# Patient Record
Sex: Male | Born: 1975
Health system: Southern US, Community
[De-identification: ages and names within clinical notes are randomized; demographics above are authoritative.]

## PROBLEM LIST (undated history)

## (undated) DIAGNOSIS — I10 Essential (primary) hypertension: Secondary | ICD-10-CM

## (undated) DIAGNOSIS — S83207A Unspecified tear of unspecified meniscus, current injury, left knee, initial encounter: Secondary | ICD-10-CM

## (undated) DIAGNOSIS — Z9889 Other specified postprocedural states: Secondary | ICD-10-CM

## (undated) DIAGNOSIS — Z5189 Encounter for other specified aftercare: Secondary | ICD-10-CM

## (undated) DIAGNOSIS — R112 Nausea with vomiting, unspecified: Secondary | ICD-10-CM

## (undated) DIAGNOSIS — M1990 Unspecified osteoarthritis, unspecified site: Secondary | ICD-10-CM

## (undated) DIAGNOSIS — S83512A Sprain of anterior cruciate ligament of left knee, initial encounter: Secondary | ICD-10-CM

## (undated) DIAGNOSIS — K7460 Unspecified cirrhosis of liver: Secondary | ICD-10-CM

## (undated) DIAGNOSIS — C229 Malignant neoplasm of liver, not specified as primary or secondary: Secondary | ICD-10-CM

## (undated) HISTORY — DX: Malignant neoplasm of liver, not specified as primary or secondary: C22.9

## (undated) HISTORY — PX: ARTHROSCOPIC REPAIR ACL: SUR80

## (undated) HISTORY — DX: Encounter for other specified aftercare: Z51.89

## (undated) HISTORY — PX: HERNIA REPAIR: SHX51

## (undated) NOTE — *Deleted (*Deleted)
Medical screening examination/treatment/procedure(s) were conducted as a shared visit with non-physician practitioner(s) and myself.  I personally evaluated the patient during the encounter.  EKG Interpretation  Date/Time:  Saturday December 14 2019 09:30:10 EST Ventricular Rate:  79 PR Interval:    QRS Duration: 101 QT Interval:  410 QTC Calculation: 473 R Axis:   -33 Text Interpretation: Sinus rhythm Left axis deviation no sig change from previous Confirmed by Arby Barrette [phone]) on 12/14/2019 10:59:25 AM

---

## 1997-05-23 ENCOUNTER — Encounter: Admission: RE | Admit: 1997-05-23 | Discharge: 1997-05-23 | Payer: Self-pay | Admitting: Family Medicine

## 1999-03-25 ENCOUNTER — Encounter: Admission: RE | Admit: 1999-03-25 | Discharge: 1999-03-25 | Payer: Self-pay | Admitting: Family Medicine

## 2000-11-17 ENCOUNTER — Encounter: Admission: RE | Admit: 2000-11-17 | Discharge: 2000-11-17 | Payer: Self-pay | Admitting: Family Medicine

## 2003-04-29 ENCOUNTER — Encounter: Admission: RE | Admit: 2003-04-29 | Discharge: 2003-04-29 | Payer: Self-pay | Admitting: Family Medicine

## 2003-05-02 ENCOUNTER — Encounter: Admission: RE | Admit: 2003-05-02 | Discharge: 2003-05-02 | Payer: Self-pay | Admitting: Family Medicine

## 2003-05-06 ENCOUNTER — Encounter: Admission: RE | Admit: 2003-05-06 | Discharge: 2003-05-06 | Payer: Self-pay | Admitting: Sports Medicine

## 2004-09-06 ENCOUNTER — Ambulatory Visit (HOSPITAL_COMMUNITY): Admission: RE | Admit: 2004-09-06 | Discharge: 2004-09-06 | Payer: Self-pay | Admitting: *Deleted

## 2004-09-14 ENCOUNTER — Ambulatory Visit (HOSPITAL_COMMUNITY): Admission: RE | Admit: 2004-09-14 | Discharge: 2004-09-14 | Payer: Self-pay | Admitting: *Deleted

## 2007-12-25 ENCOUNTER — Ambulatory Visit: Payer: Self-pay | Admitting: Sports Medicine

## 2007-12-25 DIAGNOSIS — M79609 Pain in unspecified limb: Secondary | ICD-10-CM | POA: Insufficient documentation

## 2007-12-25 DIAGNOSIS — S93316A Dislocation of tarsal joint of unspecified foot, initial encounter: Secondary | ICD-10-CM | POA: Insufficient documentation

## 2010-06-11 NOTE — Op Note (Signed)
NAMESHEPARD, KELTZ               ACCOUNT NO.:  [id number]   MEDICAL RECORD NO.:  [id number]          PATIENT TYPE:  AMB   LOCATION:  DAY                          FACILITY:  [facility]   PHYSICIAN:  Vikki Ports, MDDATE OF BIRTH:  January 31, 1975   DATE OF PROCEDURE:  09/14/2004  DATE OF DISCHARGE:                                 OPERATIVE REPORT   PREOPERATIVE DIAGNOSIS:  Left inguinal hernia.   POSTOPERATIVE DIAGNOSIS:  Left inguinal hernia.   PROCEDURE:  Laparoscopic left inguinal hernia repair with mesh.   SURGEON:  Vikki Ports, MD   ANESTHESIA:  General.   DESCRIPTION OF PROCEDURE:  The patient was taken to the operating room,  placed in a supine position and after adequate general anesthesia was  induced using an endotracheal tube, a Foley catheter was placed and the  abdomen was prepped and draped in the normal sterile fashion. Using a  transverse infraumbilical incision, I dissected down to the anterior fascia.  This was opened transversely. Preperitoneal dissection was obtained and a  dissecting balloon was placed within the preperitoneal space. It was dilated  under direct vision. An operating balloon was then placed. Two additional 5  mm trocars were placed. The pubic tubercle and Cooper's  ligament was identified, femoral vein was identified. The spermatic cord was  surrounded and a fairly large indirect hernia sac was dissected free of the  cord and retracted up superolaterally. The direct hernia space showed no  other evidence of hernia. The spermatic cord was completely skeletonized and  there was no further evidence of hernia. A Bard 3-D Max mesh was then placed  in the left anterior preperitoneal space and tacked to Cooper's ligament and  to the anterior abdominal wall in the standard fashion. This gave excellent  coverage of the floor. The pneumoperitoneum was released, fascial defect was  closed with a zero figure-of-eight Vicryl suture. Skin  incisions were closed  with subcuticular 4-0 Monocryl. Steri-Strips and sterile dressings were  applied. The patient tolerated the procedure well and went to PACU in good  condition.      Vikki Ports, MD  Electronically Signed     KRH/MEDQ  D:  09/14/2004  T:  09/15/2004  Job:  [id number]

## 2011-09-23 ENCOUNTER — Ambulatory Visit (INDEPENDENT_AMBULATORY_CARE_PROVIDER_SITE_OTHER): Payer: BC Managed Care – PPO | Admitting: Sports Medicine

## 2011-09-23 VITALS — BP 137/87 | Ht 69.0 in | Wt 175.0 lb

## 2011-09-23 DIAGNOSIS — M7550 Bursitis of unspecified shoulder: Secondary | ICD-10-CM

## 2011-09-23 DIAGNOSIS — M25519 Pain in unspecified shoulder: Secondary | ICD-10-CM

## 2011-09-23 DIAGNOSIS — IMO0002 Reserved for concepts with insufficient information to code with codable children: Secondary | ICD-10-CM

## 2011-09-23 DIAGNOSIS — M75100 Unspecified rotator cuff tear or rupture of unspecified shoulder, not specified as traumatic: Secondary | ICD-10-CM

## 2011-09-23 MED ORDER — TRAMADOL HCL 50 MG PO TABS: 50.0000 mg | ORAL_TABLET | Freq: Three times a day (TID) | ORAL | Status: DC | PRN

## 2011-09-23 MED ORDER — DICLOFENAC SODIUM 75 MG PO TBEC: DELAYED_RELEASE_TABLET | ORAL | Status: DC

## 2011-09-23 NOTE — Progress Notes (Signed)
  Subjective:    Patient ID: Joshua Hubbard, male    DOB: 1975-02-10, 36 y.o.   MRN: [id number]  HPI chief complaint: Right shoulder pain  Joshua Hubbard is a 36 year old right-hand-dominant male that comes in today complaining of 3 weeks of right shoulder pain. No injury that he can recall but gradual onset of pain that he localizes mainly over the area of the a.c. Joint. Some pain in the lateral shoulder as well. Pain is most noticeable with reaching away from his body. He's getting painful catching and popping. He does remember going on a long hike with a 37 pound backpack just prior to his symptoms starting. He also did quite a bit of yard work several days ago which exacerbated his pain. Symptoms improve at rest. No associated numbness or tingling. No neck pain. No problems with this shoulder in the past.  He is otherwise healthy, takes no chronic medications No known drug allergies Surgical history: Anterior cruciate ligament reconstruction in 2003, hernia repair in 2007 Socially he doesn't smoke, drinks occasionally, and works as a Runner, broadcasting/film/video.    Review of Systems     Objective:   Physical Exam Well-developed, well-nourished. No acute distress. Awake alert and oriented x3  Right shoulder: Full range of motion with a positive painful ARC. He is tender to palpation directly over the a.c. Joint. No tenderness over the bicipital groove. Mildly positive empty can but markedly positive Hawkins. Rotator cuff strength 5/5. Positive cross arm abduction testing. Negative O'Briens. Negative Spurling's. Neurovascularly intact distally.  Left shoulder: Full range of motion. No tenderness to palpation. Strength is 5/5. Neurovascular intact distally.  MSK ultrasound right shoulder: Subacromial bursa is markedly enlarged consistent with bursitis. There is also fluid around the a.c. joint and some in the proximal biceps tendon. Subscapularis, supraspinatus, and infraspinatus appear to be intact. He has evidence of  subacromial impingement with active shoulder abduction. This causes a palpable painful click at the subacromial bursa slides underneath the acromion.       Assessment & Plan:  1. Right shoulder pain secondary to subacromial bursitis  I discussed the patient's treatment options including oral anti-inflammatories versus a cortisone injection. Patient was like to try Voltaren 75 mg twice daily. We will give it to him for 7 days. Also given him a prescription for Ultram to take as needed for more severe pain. Patient understands that if symptoms persist we will want to reconsider a subacromial cortisone injection. I've asked him to call me next week to let me know how he is progressing. In the meantime, no heavy lifting or pushing with the right arm. He will work on passive and active range of motion to help prevent adhesive capsulitis.

## 2011-10-11 ENCOUNTER — Encounter: Payer: Self-pay | Admitting: Family Medicine

## 2011-10-11 ENCOUNTER — Ambulatory Visit (INDEPENDENT_AMBULATORY_CARE_PROVIDER_SITE_OTHER): Payer: BC Managed Care – PPO | Admitting: Family Medicine

## 2011-10-11 VITALS — BP 129/79 | HR 94 | Ht 69.0 in | Wt 175.0 lb

## 2011-10-11 DIAGNOSIS — M75100 Unspecified rotator cuff tear or rupture of unspecified shoulder, not specified as traumatic: Secondary | ICD-10-CM

## 2011-10-11 DIAGNOSIS — M7550 Bursitis of unspecified shoulder: Secondary | ICD-10-CM

## 2011-10-11 DIAGNOSIS — M25519 Pain in unspecified shoulder: Secondary | ICD-10-CM

## 2011-10-11 DIAGNOSIS — IMO0002 Reserved for concepts with insufficient information to code with codable children: Secondary | ICD-10-CM

## 2011-10-11 MED ORDER — TRAMADOL HCL 50 MG PO TABS: 50.0000 mg | ORAL_TABLET | Freq: Three times a day (TID) | ORAL | Status: AC | PRN

## 2011-10-11 NOTE — Progress Notes (Signed)
  Subjective:    Patient ID: Joshua Hubbard, male    DOB: April 15, 1975, 36 y.o.   MRN: [id number]  HPI chief complaint: Right shoulder pain  [name] comes in today with persistent right shoulder pain despite Voltaren. Ultrasound at last visit showed evidence of rotator cuff impingement and subacromial bursitis. He continues to localize his pain to the anterior lateral shoulder. Worse with reaching overhead. Some nighttime pain. Some catching and popping. No numbness and tingling.   Review of Systems     Objective:   Physical Exam Well-developed, well-nourished. No acute distress. Awake alert and oriented x3  Right shoulder: Full range of motion with a positive painful ARC. Positive Hawkins impingement test. Mildly positive empty can. Rotator cuff strength remains 5 out of 5 but slightly reproducible pain with resisted supraspinatus. No tenderness over the bicipital groove. Negative O'Briens. Neurovascular intact distally.       Assessment & Plan:  1. Persistent right shoulder pain secondary to subacromial bursitis/rotator cuff impingement  Patient's right subacromial space was injected today with a combination of 40 mg of Depo-Medrol and 3 cc of Marcaine. Injection was performed under sterile technique after risks and benefits were explained. Patient tolerated the procedure without any difficulty. He is shown a home exercise program and told to followup with me via telephone in 3-4 weeks. If symptoms persist we may need to consider merits of further diagnostic imaging in the form of plain x-rays and possibly an MRI arthrogram.  Consent obtained and verified. Time-out conducted. Noted no overlying erythema, induration, or other signs of local infection. Skin prepped in a sterile fashion. Topical analgesic spray: Ethyl chloride. Joint: Subacromial space Needle: 25-gauge 1-1/2 Completed without difficulty. Meds: 1 cc Depo-Medrol, 3 cc Marcaine  Advised to call if fevers/chills, erythema,  induration, drainage, or persistent bleeding.

## 2012-01-20 ENCOUNTER — Ambulatory Visit (INDEPENDENT_AMBULATORY_CARE_PROVIDER_SITE_OTHER): Payer: BC Managed Care – PPO | Admitting: Sports Medicine

## 2012-01-20 VITALS — BP 140/97 | Ht 69.0 in | Wt 175.0 lb

## 2012-01-20 DIAGNOSIS — S83419A Sprain of medial collateral ligament of unspecified knee, initial encounter: Secondary | ICD-10-CM

## 2012-01-20 DIAGNOSIS — M25569 Pain in unspecified knee: Secondary | ICD-10-CM

## 2012-01-20 MED ORDER — ACETAMINOPHEN-CODEINE #3 300-30 MG PO TABS: 1.0000 | ORAL_TABLET | Freq: Every evening | ORAL | Status: DC | PRN

## 2012-01-20 NOTE — Progress Notes (Signed)
  Subjective:    Patient ID: Joshua Hubbard, male    DOB: Mar 12, 1975, 36 y.o.   MRN: [id number]  HPI chief complaint: Left knee pain  Right comes in today having injured his left knee 3 weeks ago. He suffered a valgus and cell to the knee when his foot slipped on the ground. He immediate pain and swelling both along the medial aspect of his knee. Swelling has improved but pain has persisted. It is aching in quality. Worse with activity particularly at the end of the day. He notes increased swelling of the knee at the end of the day as well. He has been limping which has recently caused him to have pain in his right knee and both ankles first thing in the morning. However, he has not noticed swelling in any other joint. No mechanical symptoms. No feelings of instability. No problems with this knee in the past, but he did undergo right knee anterior cruciate ligament reconstruction with Dr. Thurston Hole in 2005. He's been taking intermittent doses of Advil. Also trying some Ultram at night but neither one of these seem to be very effective.  Interim medical history is unchanged since his last office.     Review of Systems     Objective:   Physical Exam Well-developed, well-nourished. No acute distress. Awake alert and oriented x3. Vital signs are reviewed.  Left knee: Range of motion 0-130. No effusion. No soft tissue swelling. He is tender to palpation along the medial femoral condyle at the origin of the medial collateral ligament. There is good stability without distressing but this does reproduce mild pain. Knee is stable to varus stressing. 1+ lachman and 1+ anterior drawer but a good solid endpoint is felt. Posterior drawer is negative. There is tenderness to palpation along the medial joint line with pain but no popping with McMurray's. No tenderness along the lateral joint. Neurovascularly intact distally.  Right knee: Full range of motion. No effusion. Well-healed surgical incision consistent  with his prior anterior cruciate ligament reconstruction. Negative Lachman, negative anterior drawer. Neurovascular intact distally.  MSK ultrasound of the left knee: Limited views of the medial knee were obtained. The visualized portion of the medial meniscus appears to be intact but there is fluid surrounding the meniscus with some apparent subluxation. Medial collateral ligament appears to be intact.       Assessment & Plan:  1. Left knee pain secondary to MCL sprain with possible medial meniscal tear  I discussed the possibility of an MRI of his left knee but we will wait on that for now. I will get plain x-rays of this knee and he will start Voltaren 75 mg twice daily with food for 7 days. Body helix compression sleeve to wear during the day. Isometric quad exercises. Tylenol #3 to take each bedtime when necessary. He will followup with me on January 15th. If symptoms persist we will revisit the idea of an MRI scan specifically to rule out a medial meniscal tear. He'll call with questions or concerns in the interim.

## 2012-03-29 ENCOUNTER — Other Ambulatory Visit: Payer: Self-pay | Admitting: *Deleted

## 2012-03-29 MED ORDER — HYDROCODONE-HOMATROPINE 5-1.5 MG/5ML PO SYRP: 5.0000 mL | ORAL_SOLUTION | Freq: Four times a day (QID) | ORAL | Status: DC | PRN

## 2012-04-01 ENCOUNTER — Telehealth: Payer: Self-pay | Admitting: Sports Medicine

## 2012-04-01 NOTE — Telephone Encounter (Signed)
I spoke with Joshua Hubbard on the phone a couple of days ago. He was struggling with a nonproductive cough for 2 weeks. He was requesting a refill on hydrocodone cough medicine. I've agreed to call him in a refill. I also explained to him that if his symptoms persist or worsen that he needs to followup with me in the office. He understands.

## 2012-05-22 ENCOUNTER — Ambulatory Visit: Payer: BC Managed Care – PPO | Admitting: Family Medicine

## 2012-05-30 ENCOUNTER — Ambulatory Visit: Payer: BC Managed Care – PPO | Admitting: Sports Medicine

## 2012-06-04 ENCOUNTER — Ambulatory Visit: Payer: BC Managed Care – PPO | Admitting: Sports Medicine

## 2012-07-24 ENCOUNTER — Other Ambulatory Visit: Payer: Self-pay | Admitting: *Deleted

## 2012-07-24 MED ORDER — SCOPOLAMINE 1 MG/3DAYS TD PT72: MEDICATED_PATCH | TRANSDERMAL | Status: DC

## 2012-11-09 ENCOUNTER — Ambulatory Visit (INDEPENDENT_AMBULATORY_CARE_PROVIDER_SITE_OTHER): Payer: BC Managed Care – PPO | Admitting: Sports Medicine

## 2012-11-09 ENCOUNTER — Encounter: Payer: Self-pay | Admitting: Sports Medicine

## 2012-11-09 VITALS — BP 120/70 | Ht 69.0 in | Wt 170.0 lb

## 2012-11-09 DIAGNOSIS — J309 Allergic rhinitis, unspecified: Secondary | ICD-10-CM

## 2012-11-09 DIAGNOSIS — M899 Disorder of bone, unspecified: Secondary | ICD-10-CM

## 2012-11-09 DIAGNOSIS — R0789 Other chest pain: Secondary | ICD-10-CM

## 2012-11-09 MED ORDER — HYDROCODONE-HOMATROPINE 5-1.5 MG/5ML PO SYRP: 5.0000 mL | ORAL_SOLUTION | Freq: Four times a day (QID) | ORAL | Status: DC | PRN

## 2012-11-09 MED ORDER — AZITHROMYCIN 250 MG PO TABS: ORAL_TABLET | ORAL | Status: DC

## 2012-11-09 NOTE — Progress Notes (Signed)
  Subjective:    Patient ID: Joshua Hubbard, male    DOB: [birth year], 37 y.o.   MRN: [id number]  HPI chief complaint: Epigastric pain  37 year old comes in today complaining of 2 weeks of epigastric pain. He localizes his pain directly to his xiphoid process. He is concerned because he was told several years ago that he may have a hiatal hernia in this area. He denies any heartburn. No reflux symptoms. He has pain with direct palpation over the xiphoid as well as with doing sit-ups. In fact, he tells me that he recently started doing sit-ups more frequently about a month ago and an effort to get into shape. He is also complaining of one week of sinus congestion and drainage. Mildly productive cough mainly at night. No fevers. He has been taking over-the-counter Zyrtec which has been minimally helpful.  Interim medical history is unchanged    Review of Systems     Objective:   Physical Exam Well-developed, well-nourished. No acute distress. Awake alert and oriented x3. Blood pressure 120/70  HEENT: There is tenderness to percussion over the maxillary sinuses bilaterally. Throat is clear. Neck is supple. No lymphadenopathy Cardiovascular: Regular rate. No murmurs rubs or gallops. Lungs: Clear to auscultation bilaterally. No rhonchi rales or wheezes. Abdomen: There is tenderness to palpation directly over the xiphoid process with reproducible pain with resisted sitting up. There is no palpable defect in the abdominal wall.       Assessment & Plan:  1. Xiphoid pain 2. Allergic rhinitis possibly with early sinusitis  Patient is reassured that I do not think his symptoms are from a hiatal hernia. He will refrain from sit-ups for the next 3 or 4 weeks. For his allergic rhinitis he will continue on either Zyrtec or Allegra. I have refilled his Hycodan to take when necessary at night for cough. I provided him with a prescription for a Z-Pak to fill in 2-3 days if symptoms worsen. Followup with  me in 4-6 weeks. In the meantime he will get a fasting lipid profile as a cholesterol screen.

## 2012-12-18 ENCOUNTER — Ambulatory Visit: Payer: BC Managed Care – PPO | Admitting: Sports Medicine

## 2013-01-09 ENCOUNTER — Other Ambulatory Visit: Payer: Self-pay | Admitting: Sports Medicine

## 2013-01-10 ENCOUNTER — Encounter: Payer: Self-pay | Admitting: Sports Medicine

## 2013-01-10 ENCOUNTER — Ambulatory Visit (INDEPENDENT_AMBULATORY_CARE_PROVIDER_SITE_OTHER): Payer: BC Managed Care – PPO | Admitting: Sports Medicine

## 2013-01-10 VITALS — BP 130/90 | HR 86 | Ht 69.0 in | Wt 170.0 lb

## 2013-01-10 DIAGNOSIS — E7800 Pure hypercholesterolemia, unspecified: Secondary | ICD-10-CM

## 2013-01-10 DIAGNOSIS — R030 Elevated blood-pressure reading, without diagnosis of hypertension: Secondary | ICD-10-CM

## 2013-01-10 DIAGNOSIS — F411 Generalized anxiety disorder: Secondary | ICD-10-CM

## 2013-01-10 DIAGNOSIS — IMO0001 Reserved for inherently not codable concepts without codable children: Secondary | ICD-10-CM

## 2013-01-10 LAB — LIPID PANEL
Cholesterol: 253 mg/dL — ABNORMAL HIGH (ref 0–200)
LDL Cholesterol: 161 mg/dL — ABNORMAL HIGH (ref 0–99)
Triglycerides: 172 mg/dL — ABNORMAL HIGH (ref <150)
VLDL: 34 mg/dL (ref 0–40)

## 2013-01-10 MED ORDER — DIAZEPAM 10 MG PO TABS: ORAL_TABLET | ORAL | Status: DC

## 2013-01-10 NOTE — Progress Notes (Signed)
   Subjective:    Patient ID: Joshua Hubbard, male    DOB: December 03, 1975, 37 y.o.   MRN: [id number]  HPI Joshua Hubbard comes in today to discuss blood work. He is feeling pretty good. His xiphoid process pain has improved. His only complaint is some increased anxiety with the upcoming holidays. He is requesting a benzodiazepine to take on an as-needed basis.    Review of Systems     Objective:   Physical Exam Well-developed, well-nourished. No acute distress. Slightly anxious appearing. Blood pressure 130/90 Cardiovascular: Regular rate, no murmurs, rubs, or gallops Lungs: Clear to auscultation bilaterally Abdomen: Benign. No tenderness to palpation over the xiphoid process. Extremities: No edema  A fasting lipid profile dated 01/09/2013 is reviewed. Total cholesterol is elevated at 253. Triglycerides 172. LDL is 161. HDL is 58       Assessment & Plan:  1. Elevated cholesterol 2. Mild anxiety (situational do to the upcoming holidays) 3. Elevated blood pressure 4. Resolved the xiphoid process pain  Long talk with the patient regarding diet and exercise in controlling his blood pressure and cholesterol. Patient has a history of hypercholesterolemia in the past which has required medication but I'm hoping with lifestyle changes we will be able to avoid a return to medication. His blood pressure was well-controlled at his last office visit and given his anxiety I am willing to wait and recheck this at a later date. In fact, we will plan on rechecking his blood pressure and his cholesterol in 6 months. I gave him a prescription for Valium 10 mg to take 1/2-1 pills daily as needed for anxiety, #20 pills with no refills.

## 2013-03-29 ENCOUNTER — Other Ambulatory Visit: Payer: Self-pay | Admitting: *Deleted

## 2013-03-29 MED ORDER — DIAZEPAM 10 MG PO TABS: ORAL_TABLET | ORAL | Status: DC

## 2014-07-07 ENCOUNTER — Other Ambulatory Visit: Payer: Self-pay | Admitting: *Deleted

## 2014-07-07 MED ORDER — DICLOFENAC SODIUM 75 MG PO TBEC: DELAYED_RELEASE_TABLET | ORAL | Status: DC

## 2014-08-21 ENCOUNTER — Ambulatory Visit (INDEPENDENT_AMBULATORY_CARE_PROVIDER_SITE_OTHER): Payer: BC Managed Care – PPO | Admitting: Family Medicine

## 2014-08-21 VITALS — BP 143/85 | HR 83 | Temp 98.7°F | Ht 69.0 in | Wt 167.7 lb

## 2014-08-21 DIAGNOSIS — L723 Sebaceous cyst: Secondary | ICD-10-CM

## 2014-08-21 NOTE — Progress Notes (Signed)
Joshua Hubbard is here for removal of a cyst on his back. Has been opresent several months and is getting larger. It is now sometimes painful shenhe leans back in a chair. No bleeding or pus from the cyst.  Pertinent review of systems: negative for fever or unusual weight change. OBJ WD WN NAD SKIN: upper back, in midline is a small 7 mm in diameter soft cyst. Mild erythema. Small amount sebaceous material canbe expressed with very slight pressure.  Assessment : sebaceous cyst--mildly inflamed Plan : excision  Patient given informed consent, signed copy in the chart. Appropriate time out taken. Area prepped and draped in usual sterile fashion. A 1 cm linear incision was made. The sebaceous cyst was identified and both blunt  Dissection and small amounts of sharp dissection were used to remove the cyst and all apparent portions of the sac.  A small amount of dermabond  were used to close the wound. A small amount of antibiotic  Ointment was applied and a bandage. Post procedure instructions were given, Par\tient tolerated the procedure well. There was less than 3 cc blood loss. He can f/u prn Post procedure instructions for wound care were discussed---no sutures so he can get in the pool which makes him very happy. Discussed signs of infection and he knows to call with any problems.

## 2015-03-05 ENCOUNTER — Other Ambulatory Visit: Payer: Self-pay | Admitting: Family Medicine

## 2015-03-05 MED ORDER — HYDROCODONE-HOMATROPINE 5-1.5 MG/5ML PO SYRP: 5.0000 mL | ORAL_SOLUTION | Freq: Four times a day (QID) | ORAL | Status: DC | PRN

## 2015-10-26 ENCOUNTER — Ambulatory Visit
Admission: RE | Admit: 2015-10-26 | Discharge: 2015-10-26 | Disposition: A | Discharge status: Home / Self Care | Payer: No Typology Code available for payment source | Source: Ambulatory Visit | Attending: Sports Medicine | Admitting: Sports Medicine

## 2015-10-26 ENCOUNTER — Ambulatory Visit (INDEPENDENT_AMBULATORY_CARE_PROVIDER_SITE_OTHER): Payer: Self-pay | Admitting: Sports Medicine

## 2015-10-26 DIAGNOSIS — M25562 Pain in left knee: Secondary | ICD-10-CM

## 2015-10-26 DIAGNOSIS — M79605 Pain in left leg: Secondary | ICD-10-CM

## 2015-10-26 IMAGING — CR DG TIBIA/FIBULA 2V*L*
2 series · 2 of 2 positions shown · non-contrast
Comparison: No recent prior .

CLINICAL DATA: Injury.

EXAM:
LEFT TIBIA AND FIBULA - 2 VIEW

[x tib-fib ap left]
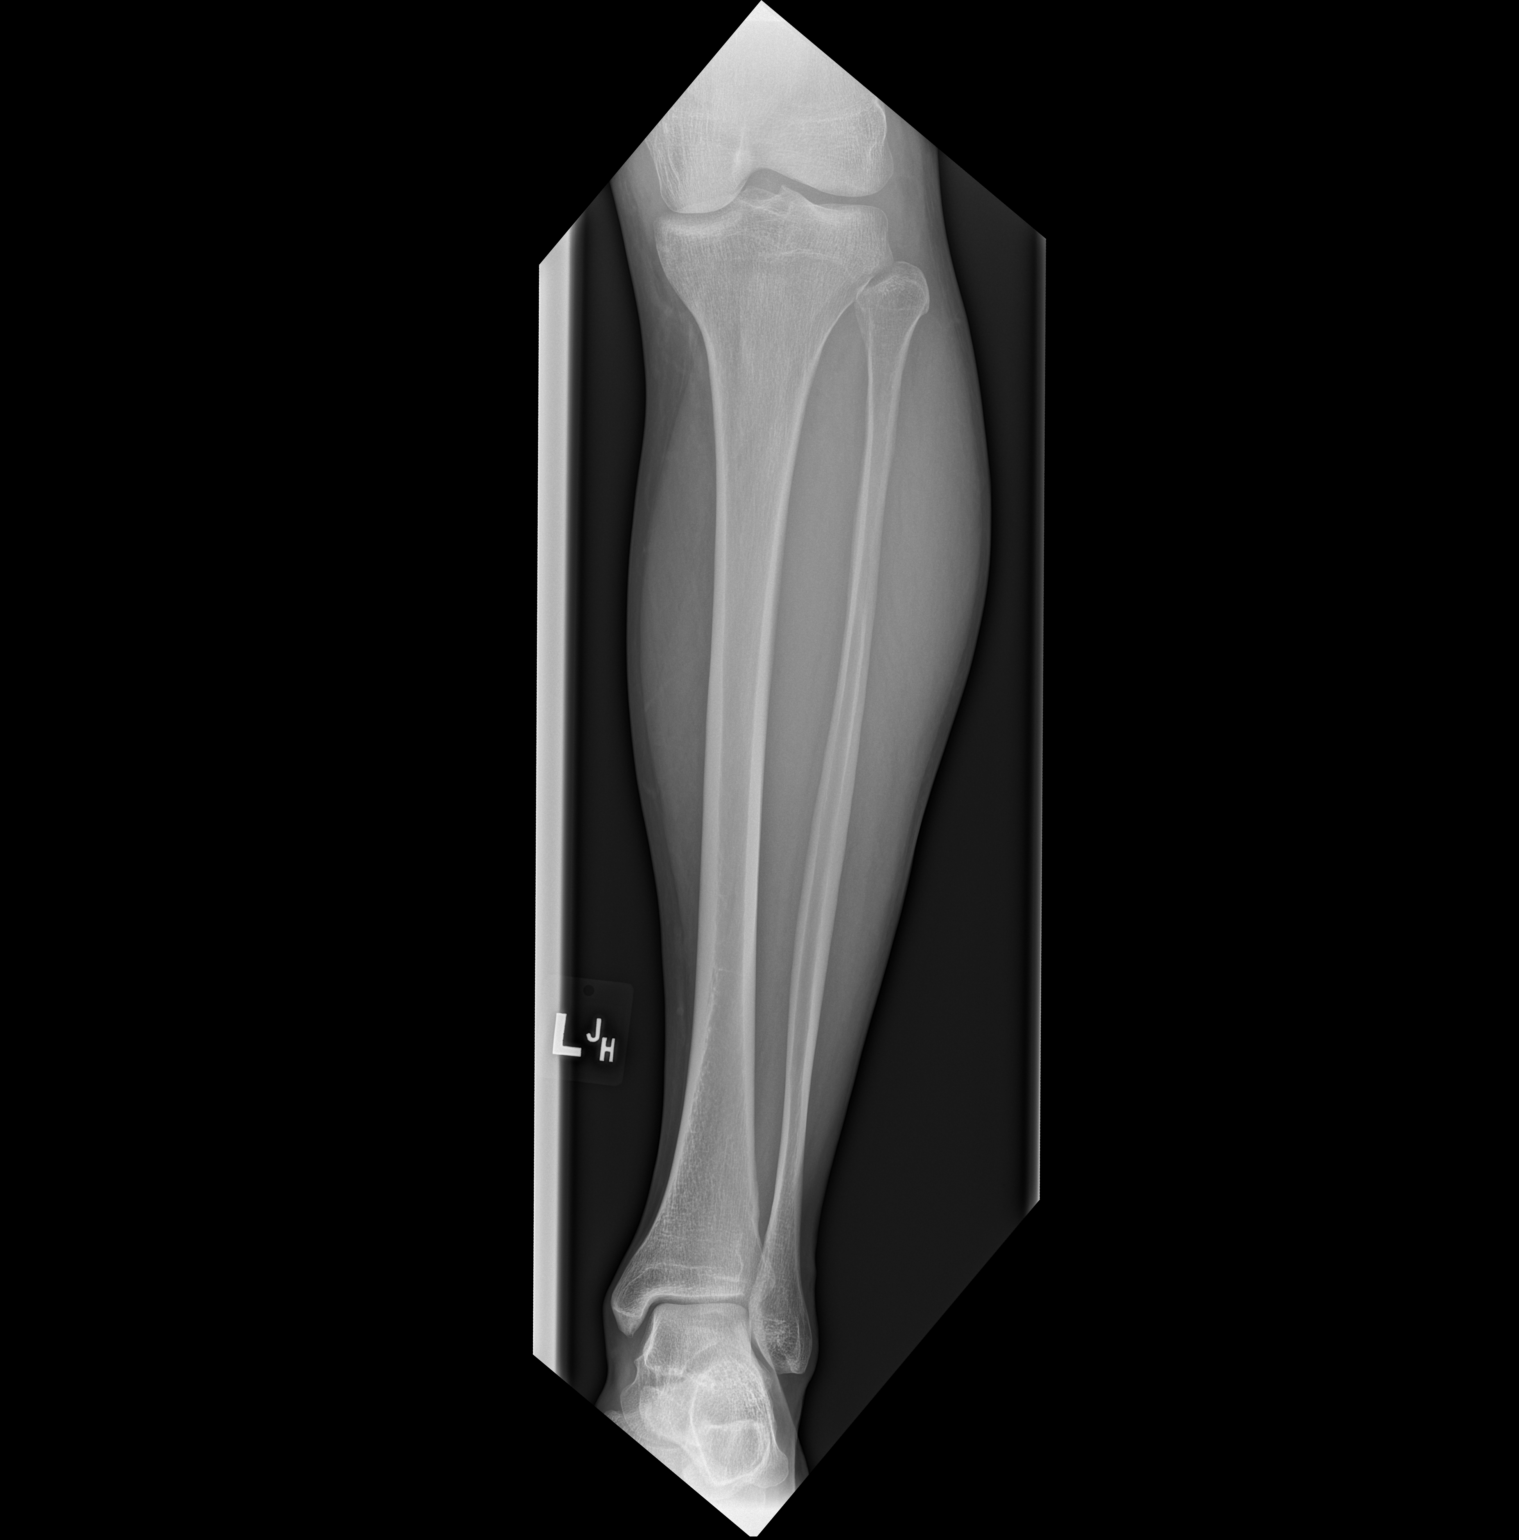

[x tib-fib lat left]
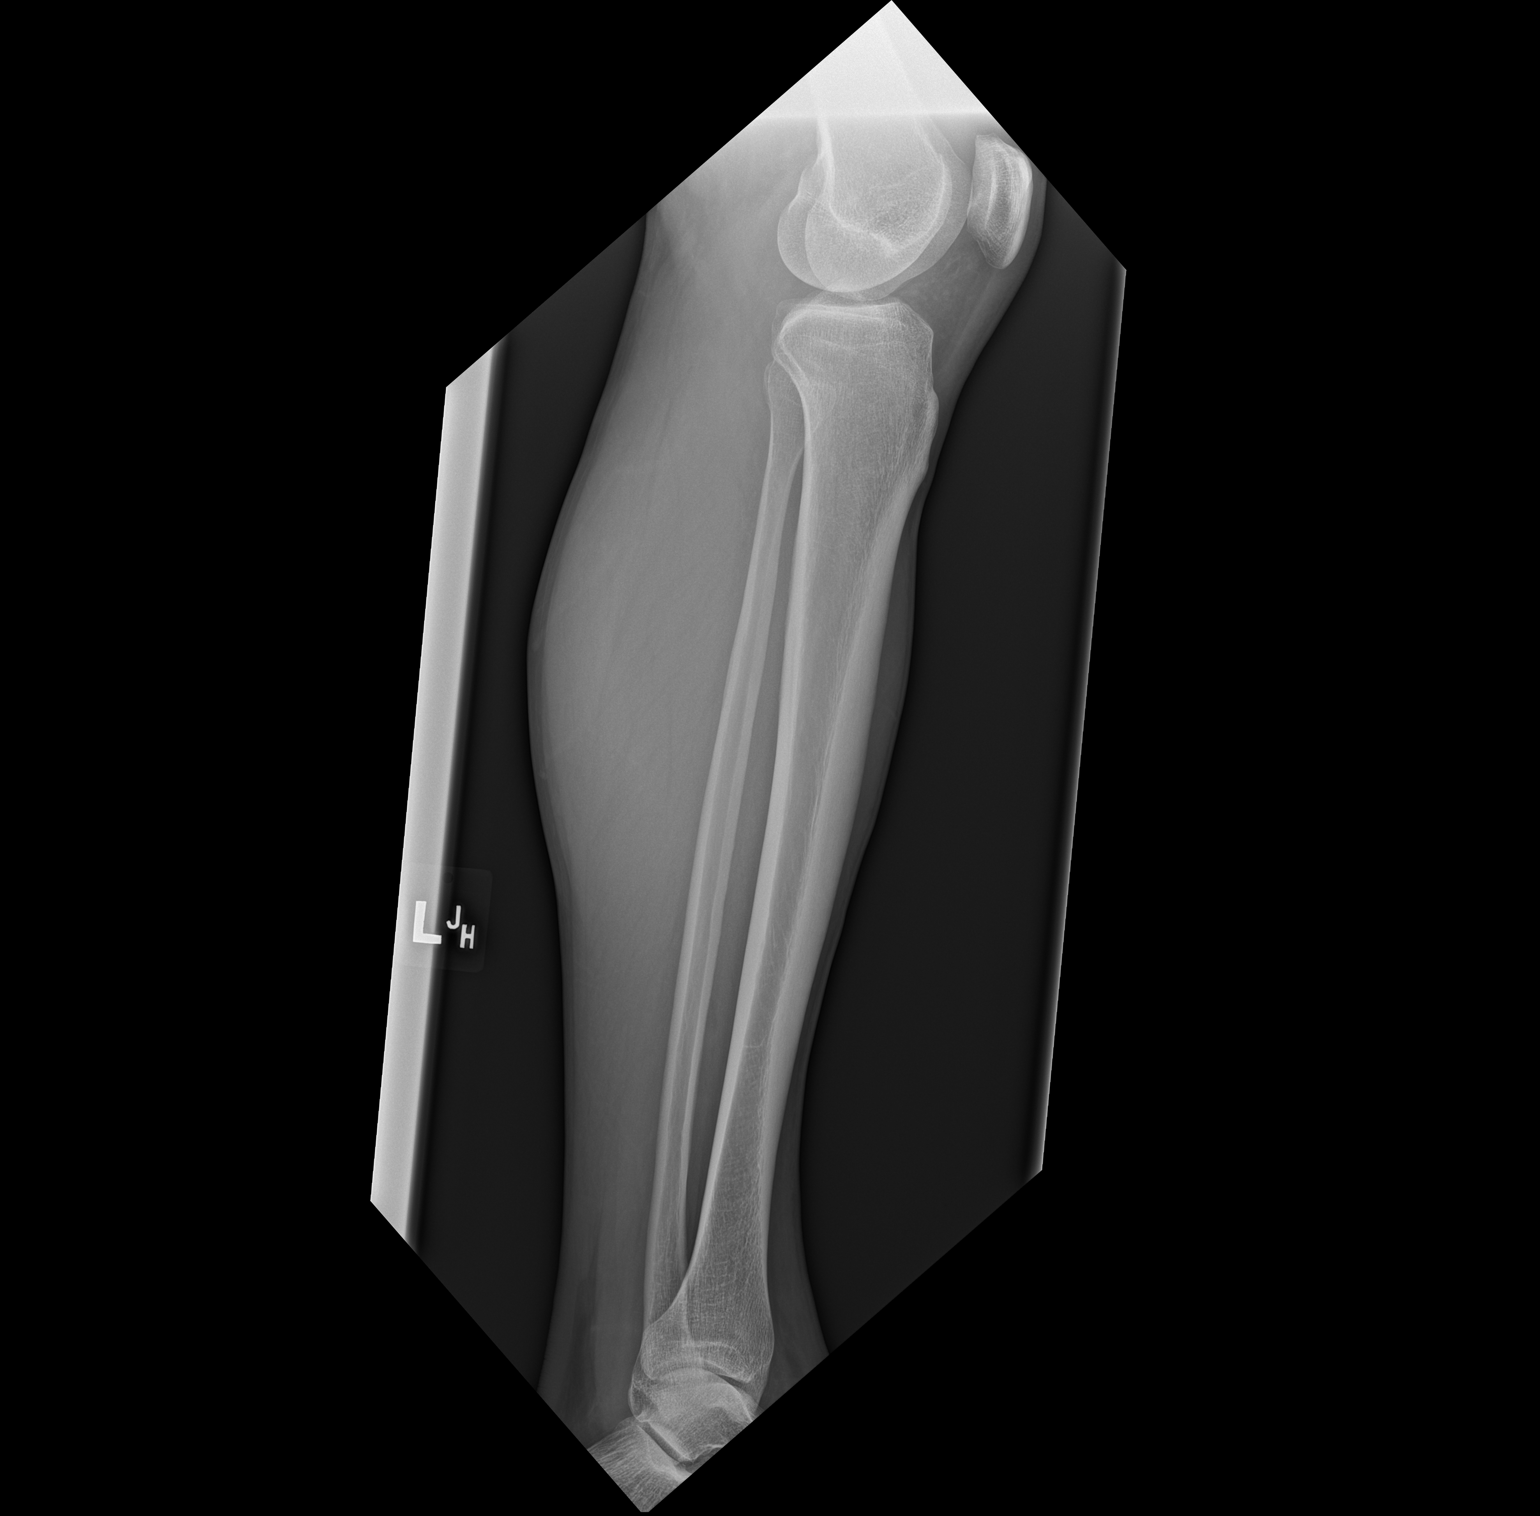

[2 of 2 positions shown; findings below may reference images not displayed]

FINDINGS: No acute bony or joint abnormality identified. No evidence of
fracture dislocation.
IMPRESSION: Negative.

## 2015-10-26 IMAGING — CR DG KNEE COMPLETE 4+V*L*
4 series · 4 of 4 positions shown · non-contrast
Comparison: None.

CLINICAL DATA: Jumped from truck bed yesterday / heard 'pop' near
LEFT knee / c/o pain med. and lat prox tibia since / pain with
weight bearing / altered gait

EXAM:
LEFT KNEE - COMPLETE 4+ VIEW

[w knee ap left]
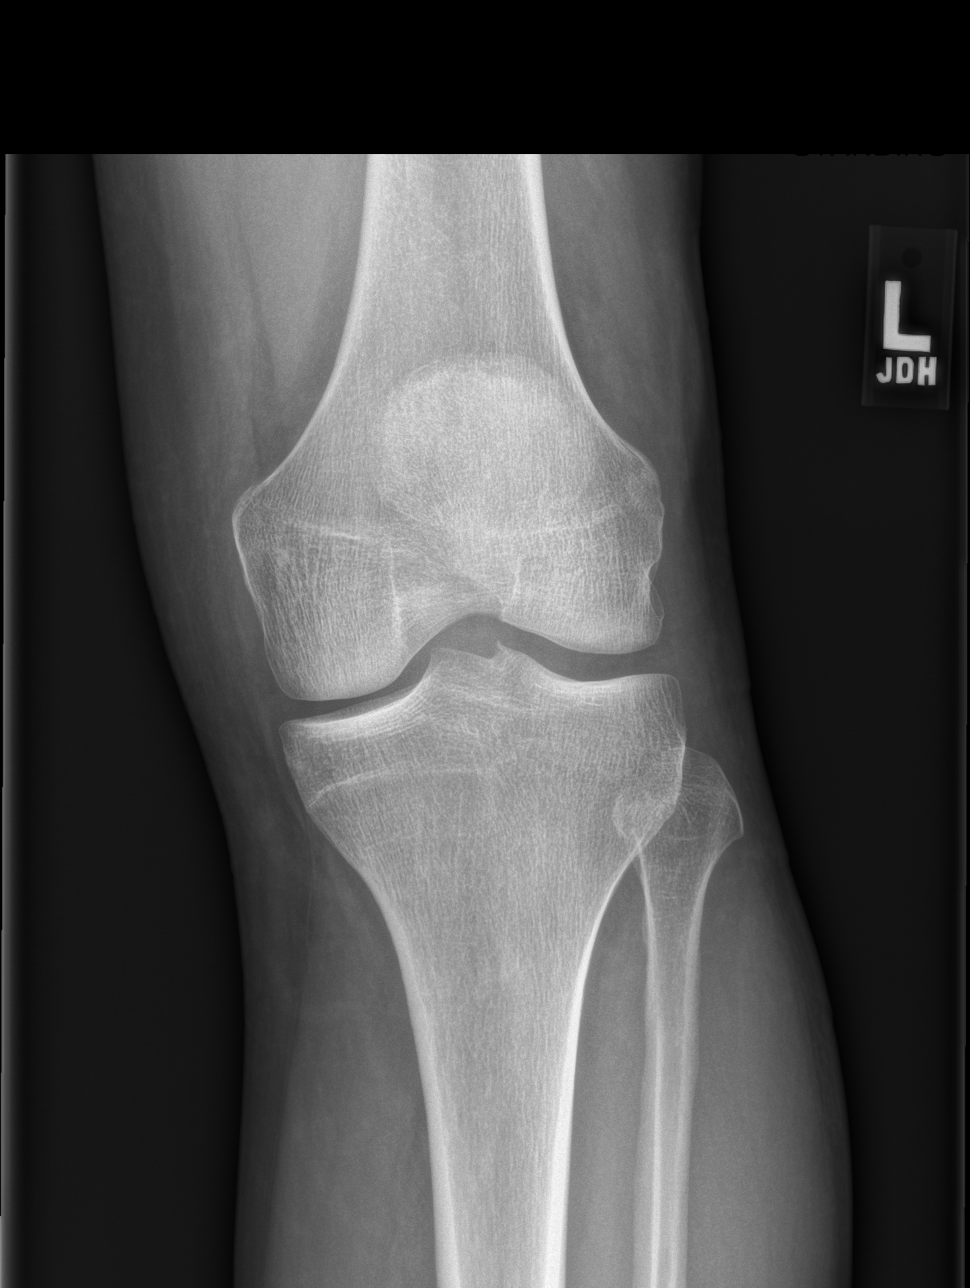

[w knee obl left (1 of 2)]
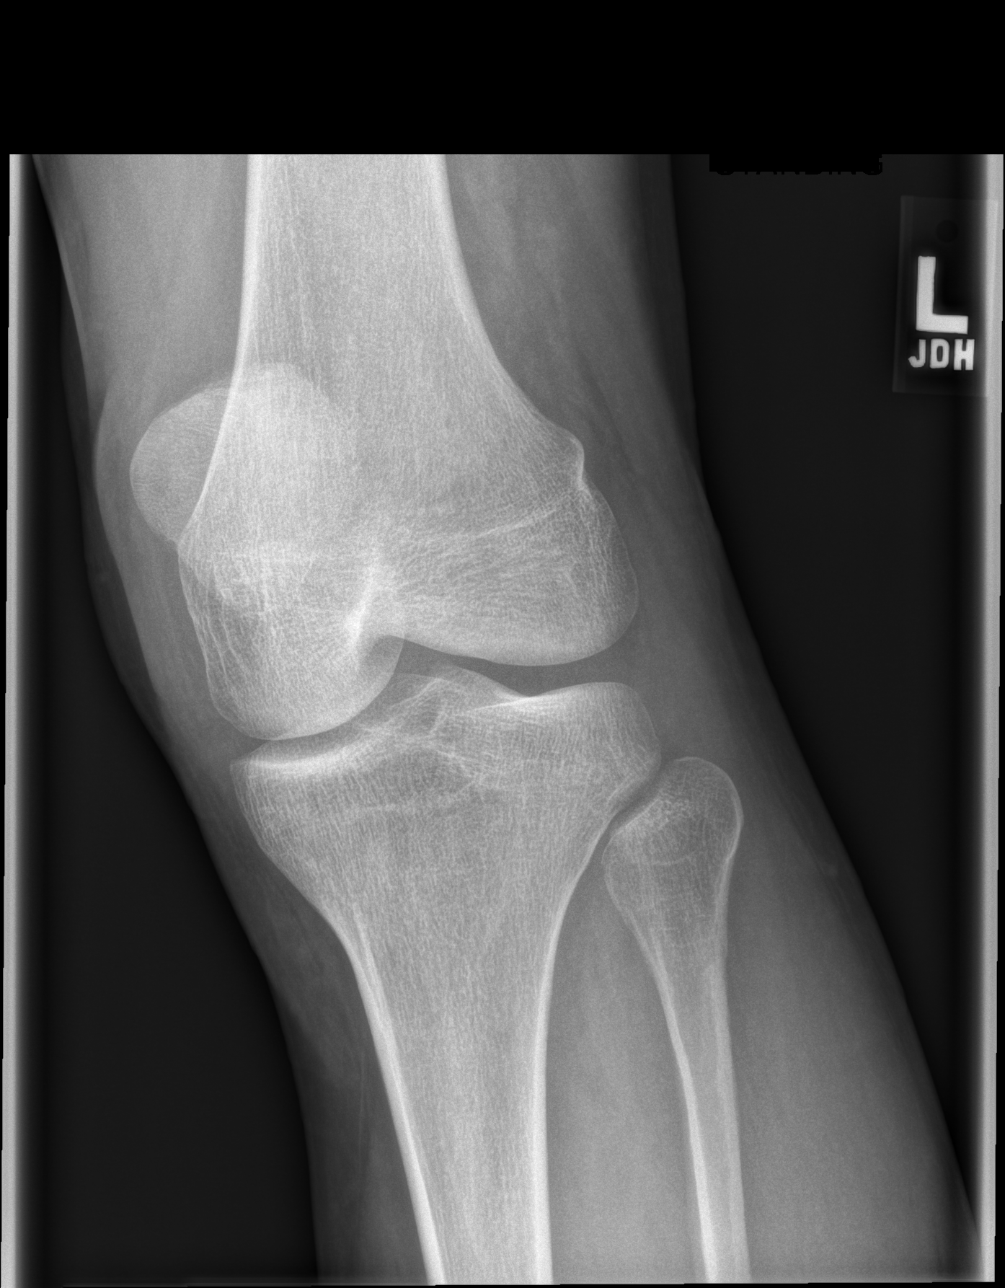

[w knee obl left (2 of 2)]
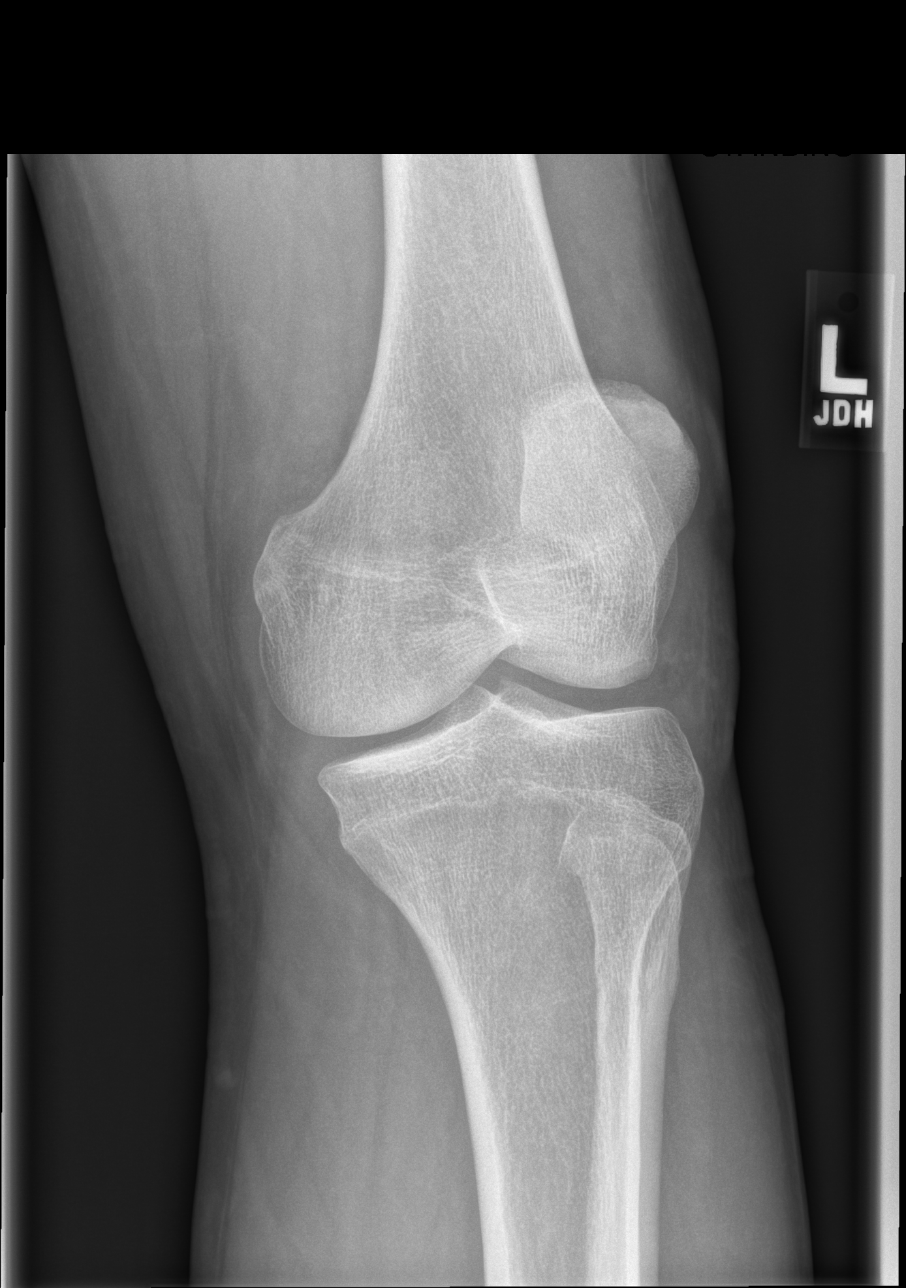

[w knee lat left]
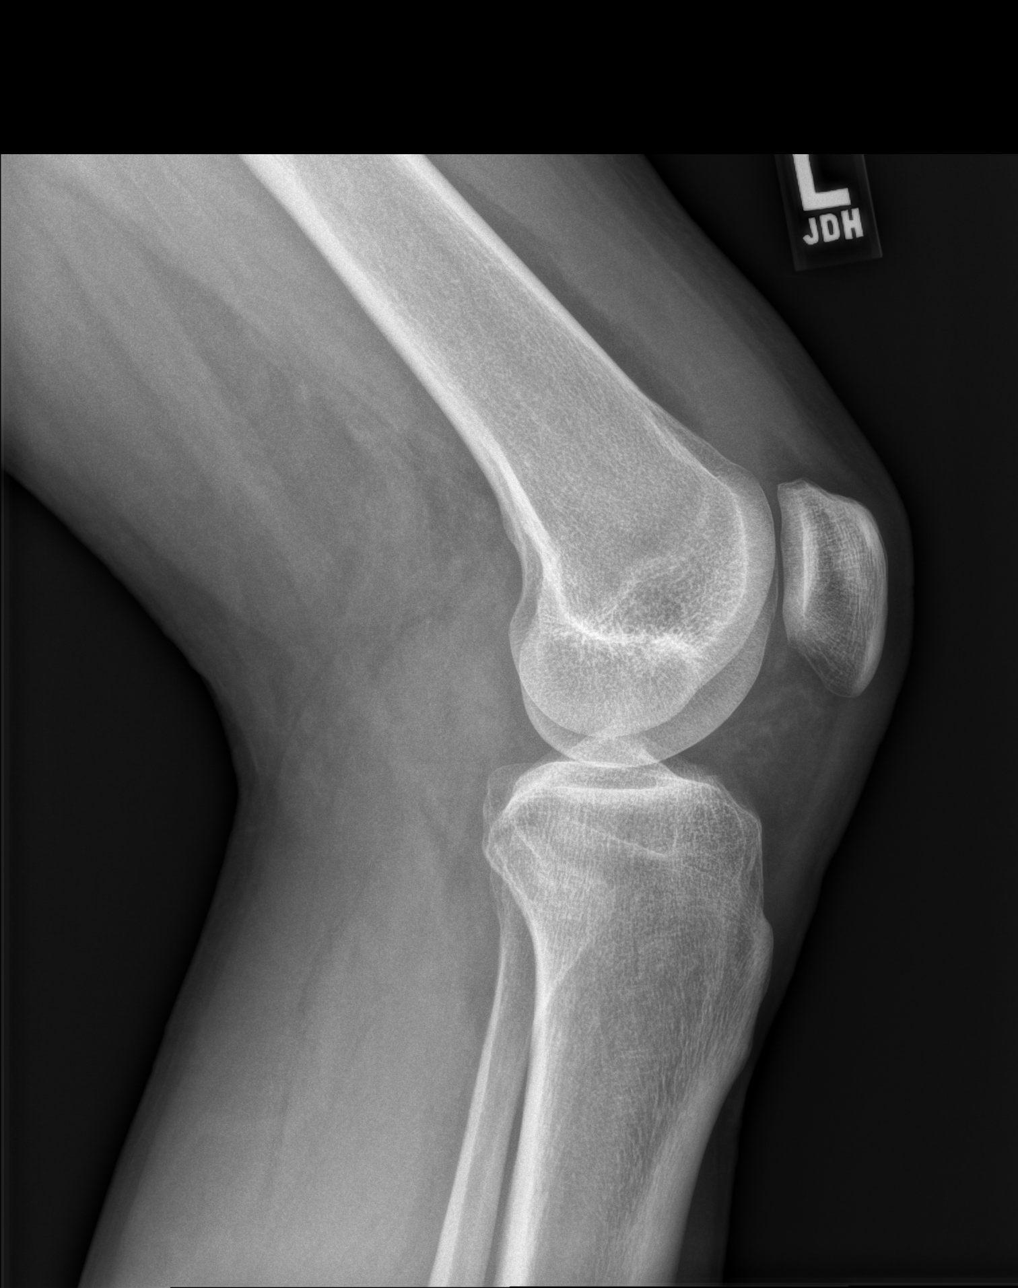

[4 of 4 positions shown; findings below may reference images not displayed]

FINDINGS: No fracture.  No bone lesion.

Knee joint is normally spaced and aligned.  No arthropathic change.

Small joint effusion.

Soft tissues are unremarkable.
IMPRESSION: 1. No fracture or dislocation.
2. Small joint effusion.  No other abnormalities.

## 2015-10-26 MED ORDER — HYDROCODONE-ACETAMINOPHEN 7.5-500 MG PO TABS: 1.0000 | ORAL_TABLET | Freq: Every evening | ORAL | 0 refills | Status: DC | PRN

## 2015-10-26 MED ORDER — MELOXICAM 15 MG PO TABS: ORAL_TABLET | ORAL | 1 refills | Status: DC

## 2015-10-26 MED ORDER — HYDROCODONE-ACETAMINOPHEN 7.5-300 MG PO TABS: ORAL_TABLET | ORAL | 0 refills | Status: DC

## 2015-10-26 NOTE — Progress Notes (Signed)
   Subjective:    Patient ID: Joshua Hubbard, male    DOB: 12/30/1975, 40 y.o.   MRN: [id number]  HPI chief complaint: Left knee and leg pain  Patient comes in today after having injured his left knee 2 days ago. He was jumping down from a pickup truck when he landed awkwardly. He suffered a twisting type injury to the left knee when his foot planted on the ground. He had an immediate pop and felt pain that he now localizes along the lateral knee. He developed some swelling later that night. He has been taking some over-the-counter anti-inflammatories without much symptom relief. He has not been able to bear weight. He comes in today on crutches. He denies any injury to his left knee previously but does have a remote history of a right anterior cruciate ligament repair. He denies any associated numbness or tingling.  Interim medical history reviewed Medications reviewed Allergies reviewed     Review of Systems    As above  Objective:   Physical Exam  Well-developed, well-nourished. No acute distress. Awake alert and oriented 3. Vital signs reviewed  Left knee: Patient has about a 5 extension lag. Flexion is only to about 90. 1+ effusion. He is tender to palpation along the lateral tibial plateau and lateral joint line. Knee is stable to valgus stressing. Varus stressing does reproduce lateral knee pain. Pain with McMurray's. Positive Lachmans but a negative anterior drawer. Negative posterior drawer. Slight tenderness to palpation along the proximal fibular shaft. Neurovascularly intact distally.  MSK ultrasound of the left knee shows a prominent effusion. Visualized lateral meniscus shows no obvious tear.  X-rays of the left knee show no obvious fracture. X-rays of the left tib-fib also are unremarkable.       Assessment & Plan:  Left knee pain and swelling worrisome for anterior cruciate ligament tear versus lateral meniscal tear/ LCL sprain  We discussed further diagnostic  imaging in the form of an MRI but the patient is currently without insurance. Therefore, we will treat him with an Ace wrap for compression, meloxicam 15 mg daily, and Vicodin to take at night as needed. He will continue to ambulate with the assistance of his crutches and he will follow-up with me again in about 10 days. If symptoms do not improve then we may need to reconsider merits of an MRI. He is encouraged to call me with questions or concerns prior to his follow-up visit.

## 2015-11-05 ENCOUNTER — Ambulatory Visit (INDEPENDENT_AMBULATORY_CARE_PROVIDER_SITE_OTHER): Payer: Self-pay | Admitting: Sports Medicine

## 2015-11-05 ENCOUNTER — Encounter: Payer: Self-pay | Admitting: Sports Medicine

## 2015-11-05 VITALS — BP 156/97 | HR 101 | Ht 69.0 in | Wt 175.0 lb

## 2015-11-05 DIAGNOSIS — M25562 Pain in left knee: Secondary | ICD-10-CM

## 2015-11-06 NOTE — Progress Notes (Signed)
   Subjective:    Patient ID: Joshua Hubbard, male    DOB: 1975-02-17, 40 y.o.   MRN: [id number]  HPI   Joshua Hubbard comes in today for follow-up on left knee pain and swelling. Pain and swelling have improved. His main complaint is instability when he is not wearing his double upright brace. He is currently only needing to take Advil for pain. Previous x-rays of his left knee, including oblique views, as well as x-rays of his left tib-fib showed no obvious fracture.    Review of Systems     Objective:   Physical Exam  Well-developed, well-nourished. No acute distress  Left knee: Full range of motion. Previous effusion has resolved. Patient still has a positive Lachmans, negative posterior drawer. He is tender to palpation along the lateral joint line. Knee is stable to valgus and varus stressing. Negative Murray's. Neurovascularly intact distally. Walking with a slight limp.      Assessment & Plan:   Left knee pain worrisome for anterior cruciate ligament tear Status post remote right knee anterior cruciate ligament reconstruction  Patient is referred to Dr. Noemi Hubbard for further workup and treatment. His appointment with Dr. Noemi Hubbard was scheduled for later this morning. I'll defer further workup and treatment to the discretion of Dr. Noemi Hubbard and the patient will follow-up with me as needed.

## 2016-06-24 ENCOUNTER — Ambulatory Visit (INDEPENDENT_AMBULATORY_CARE_PROVIDER_SITE_OTHER): Payer: BC Managed Care – PPO | Admitting: Family Medicine

## 2016-06-24 ENCOUNTER — Encounter: Payer: Self-pay | Admitting: Family Medicine

## 2016-06-24 VITALS — BP 118/84 | Ht 69.0 in | Wt 175.0 lb

## 2016-06-24 DIAGNOSIS — H9311 Tinnitus, right ear: Secondary | ICD-10-CM

## 2016-06-24 DIAGNOSIS — H9191 Unspecified hearing loss, right ear: Secondary | ICD-10-CM

## 2016-06-24 MED ORDER — CLONAZEPAM 0.5 MG PO TABS: ORAL_TABLET | ORAL | 3 refills | Status: DC

## 2016-06-24 NOTE — Progress Notes (Signed)
    CHIEF COMPLAINT / HPI:   Ringing in right ear that is constant. Really bothers him at night keeping him from falling asleep. During the day when he is distracted, he has less problem with it. He's also had 50% hearing loss in that ear. This all started about 9 or 10 months ago when he was swimming, evidently got some water in his ear and then has subsequent ear infection and/or tympanic membrane rupture. He was not seen by medical personnel. He did have an episode where he woke up and there was a lot of blood and pus on his pillow. He thought it would heal so he did not seek medical care and since then he has had these issues.  REVIEW OF SYSTEMS:  No unusual weight change. No dizziness. No headache. Decreased hearing and tinnitus as per history of present illness. No swallowing problems  OBJECTIVE:  Vital signs are reviewed.   GEN.: Well-developed male no acute distress HEENT: Left TM is normal. Right TM partially obscured by some cerumen but there are no identifiable landmarks specifically no [place] 8. The TM is somewhat dull and not particularly mobile. Oropharynx is clear without any sign of exudate. Neck is full range of motion, no lymphadenopathy, no thyromegaly. NEURO: Balance is intact. Decreased hearing grossly on the right.  ASSESSMENT / PLAN: #1. Subacute/chronic tinnitus lasting 8-10 months now. Seems to started with some barotrauma while swimming, possibly perforated TM. I'm concerned for incomplete her abnormal healing, possible cholesteatoma. We'll refer him for ENT evaluation. I did not attempt cleaning out the cerumen today. I will give him some low-dose Klonopin to use at night to help him sleep until we get the tinnitus evaluated.

## 2016-06-24 NOTE — Patient Instructions (Signed)
[place] [facility] Dr Ileene Hutchinson [facility] Monday July 2nd at [facility] time is 230pm [address], [place], [place] [address] Phone: [phone]

## 2016-07-25 DIAGNOSIS — H66011 Acute suppurative otitis media with spontaneous rupture of ear drum, right ear: Secondary | ICD-10-CM | POA: Insufficient documentation

## 2016-07-25 DIAGNOSIS — H9071 Mixed conductive and sensorineural hearing loss, unilateral, right ear, with unrestricted hearing on the contralateral side: Secondary | ICD-10-CM | POA: Insufficient documentation

## 2016-10-20 ENCOUNTER — Other Ambulatory Visit: Payer: Self-pay | Admitting: Family Medicine

## 2016-12-09 ENCOUNTER — Other Ambulatory Visit: Payer: Self-pay | Admitting: Family Medicine

## 2016-12-09 MED ORDER — ACETAMINOPHEN-CODEINE 300-30 MG PO TABS: ORAL_TABLET | ORAL | 0 refills | Status: DC

## 2016-12-09 NOTE — Progress Notes (Unsigned)
[place] Can you call this in [place]! Dorcas Mcmurray

## 2017-01-19 DIAGNOSIS — J209 Acute bronchitis, unspecified: Secondary | ICD-10-CM | POA: Diagnosis not present

## 2017-01-23 ENCOUNTER — Other Ambulatory Visit: Payer: Self-pay | Admitting: *Deleted

## 2017-01-23 MED ORDER — HYDROCODONE-HOMATROPINE 5-1.5 MG/5ML PO SYRP: 5.0000 mL | ORAL_SOLUTION | Freq: Four times a day (QID) | ORAL | 0 refills | Status: DC | PRN

## 2017-01-23 MED FILL — HYDROCODONE-HOMATROPINE SYR: 5-1.5 | 6 days supply | Qty: 120 | Fill #0

## 2017-02-01 ENCOUNTER — Other Ambulatory Visit: Payer: Self-pay

## 2017-02-01 ENCOUNTER — Encounter: Payer: Self-pay | Admitting: Family Medicine

## 2017-02-01 ENCOUNTER — Ambulatory Visit (INDEPENDENT_AMBULATORY_CARE_PROVIDER_SITE_OTHER): Payer: Self-pay | Admitting: Family Medicine

## 2017-02-01 VITALS — BP 128/84 | HR 100 | Temp 98.5°F | Ht 69.0 in | Wt 177.0 lb

## 2017-02-01 DIAGNOSIS — R490 Dysphonia: Secondary | ICD-10-CM

## 2017-02-01 DIAGNOSIS — H9313 Tinnitus, bilateral: Secondary | ICD-10-CM

## 2017-02-01 DIAGNOSIS — Z8249 Family history of ischemic heart disease and other diseases of the circulatory system: Secondary | ICD-10-CM

## 2017-02-01 MED ORDER — CLONAZEPAM 1 MG PO TABS: 1.0000 mg | ORAL_TABLET | Freq: Every day | ORAL | 2 refills | Status: DC

## 2017-02-01 MED ORDER — FLUTICASONE PROPIONATE 50 MCG/ACT NA SUSP: 1.0000 | Freq: Every day | NASAL | 6 refills | Status: DC

## 2017-02-01 MED FILL — clonazePAM 1 MG TABS: 1 | 30 days supply | Qty: 30 | Fill #0

## 2017-02-01 MED FILL — FLUTICASONE PROP 50 MCG SPR: 50 | 60 days supply | Qty: 16 | Fill #0

## 2017-02-01 NOTE — Patient Instructions (Signed)
Call for the ENT appointment.  I have placed the referral.  Since she has been seen there before, I think you should be able to call.  If you have any problems, let me know. I am starting you on some nasal spray as we discussed.  I have also increased your dose of clonazepam at night for the ringing in your ears.  Hopefully we do not have to use this more than a couple months.  I want the ENT doctor to see you for your right tympanic membrane which looks like it has a chronic effusion behind it.  That is also the ear you had some hearing loss and that he wanted to check in a year.  Also want him to check your hoarseness.  We will place an order for you to get some lab work in the next few weeks.  Please come in fasting and get the labs done.  Probably need to get him a schedule at some point in the future for complete physical is been a long time since she had one.  Great to see you.

## 2017-02-02 ENCOUNTER — Encounter: Payer: Self-pay | Admitting: Family Medicine

## 2017-02-02 NOTE — Progress Notes (Signed)
    CHIEF COMPLAINT / HPI:  Ringing in both ears, most bothersome at night when he tries to go to sleep. He hears a high-pitched ringing noise all of the time. He is to really enjoy sitting quietly and reading a book but now he can't do that because some ringing is overwhelming. He had previously seen ENT and they wanted him to come back any year for follow-up of some mild mid frequency hearing loss in the right ear. He said the last few months the tinnitus has gotten significantly worse. He's also had an upper rest or infection and a mild bronchitis. He isthat he's been more hoarse since the bronchitis even though his symptoms of cough etc. resolved about 4-6 weeks ago. At one point he had gotten some clonazepam to use on nights when he is having a lot of trouble with the ringing in his ears keeping him awake. Last couple of months of the ringing has gotten worse and he has run out of Flonase pain. Snuff he can have a refill on that and possibly increase the dose because his symptoms are somewhat worse right now he's not sure that the previous dose would actually help him.  REVIEW OF SYSTEMS: Mild intermittent right-sided ear pain, hoarse voice. No chest pain. No headaches. No unusual weight change. No fever. Activity level is normal. No unusual stressors.  PERTINENT  PMH / PSH: I have reviewed the patient's medications, allergies, past medical and surgical history, smoking status and updated in the EMR as appropriate. Never smoker   OBJECTIVE:  GEN.: Well-developed male no acute distress HEENT: TM on the left is slightly retracted with small amount of serous fluid behind it. The TM on the right is abnormal in appearance with central bulging and peripheral retraction. The right TM is somewhat dull. Oropharynx is without any sign of exudate. Neck is without any lymphadenopathy. Conjunctivae clear. It extraocular muscles are intact. LUNGS: Clear to auscultation bilaterally CV: Regular rate and rhythm  no murmur. VOICE: Hoarse. PSYCH: Alert and oriented 4. Affects interactive. Asks and answers questions appropriately.  ASSESSMENT / PLAN: Please see problem oriented charting for details Additionally, I would like him to come back for complete physical the near future as it's been quite a while since we've done that. I'll place future order for fasting lipid panel and CMP.

## 2017-02-02 NOTE — Assessment & Plan Note (Signed)
Symptoms seem somewhat worse than last couple of months. Possibly related to some allergic symptoms or sequela of fire or URI. His right TM does look a little abnormal. I'll place him on fluticasone nasal spray and I want him to be seen again by ENT side made that referral. Also want them to evaluate his hoarseness.

## 2017-03-01 MED FILL — clonazePAM 1 MG TABS: 1 | 30 days supply | Qty: 30 | Fill #1

## 2017-03-02 DIAGNOSIS — H9311 Tinnitus, right ear: Secondary | ICD-10-CM | POA: Insufficient documentation

## 2017-03-02 DIAGNOSIS — H918X1 Other specified hearing loss, right ear: Secondary | ICD-10-CM | POA: Insufficient documentation

## 2017-03-02 DIAGNOSIS — IMO0001 Reserved for inherently not codable concepts without codable children: Secondary | ICD-10-CM | POA: Insufficient documentation

## 2017-03-28 MED FILL — clonazePAM 1 MG TABS: 1 | 30 days supply | Qty: 30 | Fill #2

## 2017-04-13 DIAGNOSIS — K429 Umbilical hernia without obstruction or gangrene: Secondary | ICD-10-CM | POA: Diagnosis not present

## 2017-04-14 DIAGNOSIS — K429 Umbilical hernia without obstruction or gangrene: Secondary | ICD-10-CM | POA: Insufficient documentation

## 2017-05-03 DIAGNOSIS — K420 Umbilical hernia with obstruction, without gangrene: Secondary | ICD-10-CM | POA: Diagnosis not present

## 2017-05-03 DIAGNOSIS — K738 Other chronic hepatitis, not elsewhere classified: Secondary | ICD-10-CM | POA: Diagnosis not present

## 2017-05-03 DIAGNOSIS — K7469 Other cirrhosis of liver: Secondary | ICD-10-CM | POA: Diagnosis not present

## 2017-05-03 DIAGNOSIS — K429 Umbilical hernia without obstruction or gangrene: Secondary | ICD-10-CM | POA: Diagnosis not present

## 2017-05-03 DIAGNOSIS — Z9889 Other specified postprocedural states: Secondary | ICD-10-CM | POA: Diagnosis not present

## 2017-05-08 ENCOUNTER — Other Ambulatory Visit: Payer: Self-pay | Admitting: Family Medicine

## 2017-05-08 MED FILL — clonazePAM 1 MG TABS: 1 | 30 days supply | Qty: 30 | Fill #0

## 2017-05-10 ENCOUNTER — Ambulatory Visit: Payer: Self-pay | Admitting: Family Medicine

## 2017-06-05 MED FILL — clonazePAM 1 MG TABS: 1 | 30 days supply | Qty: 30 | Fill #1

## 2017-06-30 ENCOUNTER — Other Ambulatory Visit: Payer: Self-pay | Admitting: *Deleted

## 2017-06-30 MED ORDER — DIAZEPAM 10 MG PO TABS: ORAL_TABLET | ORAL | 0 refills | Status: DC

## 2017-06-30 MED ORDER — SCOPOLAMINE 1 MG/3DAYS TD PT72: MEDICATED_PATCH | TRANSDERMAL | 1 refills | Status: DC

## 2017-06-30 MED FILL — TRANSDERM-SCOP 1.5 MG/72HR: 1 | 12 days supply | Qty: 4 | Fill #0

## 2017-06-30 MED FILL — diazePAM 10 MG TABS: 10 | 2 days supply | Qty: 2 | Fill #0

## 2017-07-03 MED FILL — clonazePAM 1 MG TABS: 1 | 30 days supply | Qty: 30 | Fill #2

## 2017-08-04 ENCOUNTER — Other Ambulatory Visit: Payer: Self-pay | Admitting: Family Medicine

## 2017-08-07 MED FILL — clonazePAM 1 MG TABS: 1 | 30 days supply | Qty: 30 | Fill #0

## 2017-09-04 MED FILL — clonazePAM 1 MG TABS: 1 | 30 days supply | Qty: 30 | Fill #1

## 2017-09-27 ENCOUNTER — Other Ambulatory Visit: Payer: Self-pay

## 2017-09-27 MED ORDER — DICLOFENAC SODIUM 75 MG PO TBEC: DELAYED_RELEASE_TABLET | ORAL | 0 refills | Status: DC

## 2017-09-27 MED FILL — DICLOFENAC SODIUM 75 MG TAB: 75 | 30 days supply | Qty: 60 | Fill #0

## 2017-10-05 MED FILL — clonazePAM 1 MG TABS: 1 | 30 days supply | Qty: 30 | Fill #2

## 2017-10-09 ENCOUNTER — Other Ambulatory Visit: Payer: Self-pay

## 2017-10-09 MED ORDER — CEPHALEXIN 500 MG PO CAPS: ORAL_CAPSULE | ORAL | 0 refills | Status: DC

## 2017-10-09 MED FILL — CEPHALEXIN 500 MG CAPSULE: 500 | 7 days supply | Qty: 21 | Fill #0

## 2017-11-06 ENCOUNTER — Other Ambulatory Visit: Payer: Self-pay | Admitting: Family Medicine

## 2017-11-07 MED FILL — clonazePAM 1 MG TABS: 1 | 90 days supply | Qty: 90 | Fill #0

## 2018-02-27 DIAGNOSIS — S83512A Sprain of anterior cruciate ligament of left knee, initial encounter: Secondary | ICD-10-CM | POA: Diagnosis not present

## 2018-02-27 DIAGNOSIS — S2232XA Fracture of one rib, left side, initial encounter for closed fracture: Secondary | ICD-10-CM | POA: Diagnosis not present

## 2018-03-01 DIAGNOSIS — M25562 Pain in left knee: Secondary | ICD-10-CM | POA: Diagnosis not present

## 2018-03-12 DIAGNOSIS — S83512D Sprain of anterior cruciate ligament of left knee, subsequent encounter: Secondary | ICD-10-CM | POA: Diagnosis not present

## 2018-03-13 ENCOUNTER — Other Ambulatory Visit: Payer: Self-pay

## 2018-03-13 ENCOUNTER — Encounter (HOSPITAL_BASED_OUTPATIENT_CLINIC_OR_DEPARTMENT_OTHER): Payer: Self-pay | Admitting: *Deleted

## 2018-03-19 ENCOUNTER — Encounter (HOSPITAL_BASED_OUTPATIENT_CLINIC_OR_DEPARTMENT_OTHER): Payer: Self-pay | Admitting: Physician Assistant

## 2018-03-19 DIAGNOSIS — S83512A Sprain of anterior cruciate ligament of left knee, initial encounter: Secondary | ICD-10-CM

## 2018-03-19 DIAGNOSIS — S83207A Unspecified tear of unspecified meniscus, current injury, left knee, initial encounter: Secondary | ICD-10-CM

## 2018-03-19 HISTORY — DX: Unspecified tear of unspecified meniscus, current injury, left knee, initial encounter: S83.207A

## 2018-03-19 NOTE — H&P (Signed)
Joshua Hubbard is an 43 y.o. male.   Chief Complaint: left knee ACL graft tear and mensicus tear HPI: Joshua Hubbard is a 43 year-old seen for an acute injury to his left knee while snow skiing in early February.  He felt a pop in the knee when he fell.  He has had pain and swelling overnight.  We had performed an ACL reconstruction on his knee two years ago and he did well until this recent injury.    Past Medical History:  Diagnosis Date  . Arthritis   . Tears of meniscus and ACL of left knee 03/19/2018    Past Surgical History:  Procedure Laterality Date  . ARTHROSCOPIC REPAIR ACL     x2  . HERNIA REPAIR     x3    Family History  Problem Relation Age of Onset  . Osteoarthritis Mother   . Coronary artery disease Father   . Hyperlipidemia Father    Social History:  reports that he has never smoked. He has never used smokeless tobacco. He reports current alcohol use. He reports that he does not use drugs.  Allergies: No Known Allergies  No medications prior to admission.    No results found for this or any previous visit (from the past 48 hour(s)). No results found.  Review of Systems  Constitutional: Negative.   HENT: Negative.   Eyes: Negative.   Respiratory: Negative.   Cardiovascular: Negative.   Gastrointestinal: Negative.   Genitourinary: Negative.   Musculoskeletal: Positive for joint pain.  Skin: Negative.   Neurological: Negative.   Endo/Heme/Allergies: Negative.   Psychiatric/Behavioral: Negative.     Height 5\' 9"  (1.753 m), weight 77.1 kg. Physical Exam  Constitutional: He is oriented to person, place, and time. He appears well-developed and well-nourished.  HENT:  Head: Normocephalic and atraumatic.  Mouth/Throat: Oropharynx is clear and moist.  Eyes: Pupils are equal, round, and reactive to light. Conjunctivae are normal.  Neck: Neck supple.  Cardiovascular: Normal rate.  Respiratory: Effort normal.  GI: Soft.  Genitourinary:    Genitourinary Comments:  Not pertinent to current symptomatology therefore not examined.   Musculoskeletal:     Comments: Examination of his left knee reveals 3+ effusion with pain.  1-2+ Lachman.  Range of motion 0-60 degrees.  Knee is otherwise stable with normal patella tracking.  Examination of the right knee reveals full range of motion without pain, swelling, weakness or instability.  Vascular exam: Pulses are 2+ and symmetric.    Neurological: He is alert and oriented to person, place, and time.  Skin: Skin is warm and dry.  Psychiatric: He has a normal mood and affect. His behavior is normal.     Assessment Principal Problem:   Tears of meniscus and ACL of left knee   Plan I spoke to [facility] concerning his left knee MRI that revealed probable ACL graft tear, as well as medial and lateral meniscal tears with an impaction injury to his lateral tibial plateau.  I told him with these findings we will need to proceed with left knee arthroscopy with possible hamstring allograft revision reconstruction with medial and lateral meniscectomies versus repair.  Risks, complications and benefits of the surgery have been described to him in detail and he understands this completely.  He understands and agrees with this plan.    Sarha Bartelt J Draeden Kellman, PA-C 03/19/2018, 1:18 PM

## 2018-03-20 ENCOUNTER — Encounter (HOSPITAL_BASED_OUTPATIENT_CLINIC_OR_DEPARTMENT_OTHER): Payer: Self-pay | Admitting: Certified Registered"

## 2018-03-20 ENCOUNTER — Ambulatory Visit (HOSPITAL_BASED_OUTPATIENT_CLINIC_OR_DEPARTMENT_OTHER)
Admission: RE | Admit: 2018-03-20 | Discharge: 2018-03-20 | Disposition: A | Discharge status: Home / Self Care | Payer: 59 | Source: Ambulatory Visit | Attending: Orthopedic Surgery | Admitting: Orthopedic Surgery

## 2018-03-20 ENCOUNTER — Ambulatory Visit (HOSPITAL_BASED_OUTPATIENT_CLINIC_OR_DEPARTMENT_OTHER): Payer: 59 | Admitting: Certified Registered"

## 2018-03-20 ENCOUNTER — Other Ambulatory Visit: Payer: Self-pay

## 2018-03-20 ENCOUNTER — Encounter (HOSPITAL_BASED_OUTPATIENT_CLINIC_OR_DEPARTMENT_OTHER)
Admission: RE | Disposition: A | Discharge status: Home / Self Care | Payer: Self-pay | Source: Ambulatory Visit | Attending: Orthopedic Surgery

## 2018-03-20 DIAGNOSIS — S83509A Sprain of unspecified cruciate ligament of unspecified knee, initial encounter: Secondary | ICD-10-CM | POA: Diagnosis not present

## 2018-03-20 DIAGNOSIS — Z8249 Family history of ischemic heart disease and other diseases of the circulatory system: Secondary | ICD-10-CM | POA: Insufficient documentation

## 2018-03-20 DIAGNOSIS — W19XXXA Unspecified fall, initial encounter: Secondary | ICD-10-CM | POA: Diagnosis not present

## 2018-03-20 DIAGNOSIS — M1990 Unspecified osteoarthritis, unspecified site: Secondary | ICD-10-CM | POA: Diagnosis not present

## 2018-03-20 DIAGNOSIS — S83207A Unspecified tear of unspecified meniscus, current injury, left knee, initial encounter: Secondary | ICD-10-CM | POA: Diagnosis present

## 2018-03-20 DIAGNOSIS — S83512A Sprain of anterior cruciate ligament of left knee, initial encounter: Secondary | ICD-10-CM | POA: Diagnosis not present

## 2018-03-20 DIAGNOSIS — G8918 Other acute postprocedural pain: Secondary | ICD-10-CM | POA: Diagnosis not present

## 2018-03-20 DIAGNOSIS — S83282A Other tear of lateral meniscus, current injury, left knee, initial encounter: Secondary | ICD-10-CM | POA: Insufficient documentation

## 2018-03-20 DIAGNOSIS — Y9323 Activity, snow (alpine) (downhill) skiing, snow boarding, sledding, tobogganing and snow tubing: Secondary | ICD-10-CM | POA: Diagnosis not present

## 2018-03-20 DIAGNOSIS — S83242A Other tear of medial meniscus, current injury, left knee, initial encounter: Secondary | ICD-10-CM | POA: Diagnosis not present

## 2018-03-20 DIAGNOSIS — S83289A Other tear of lateral meniscus, current injury, unspecified knee, initial encounter: Secondary | ICD-10-CM | POA: Diagnosis not present

## 2018-03-20 DIAGNOSIS — S83249A Other tear of medial meniscus, current injury, unspecified knee, initial encounter: Secondary | ICD-10-CM | POA: Diagnosis not present

## 2018-03-20 DIAGNOSIS — Z8261 Family history of arthritis: Secondary | ICD-10-CM | POA: Diagnosis not present

## 2018-03-20 HISTORY — DX: Unspecified osteoarthritis, unspecified site: M19.90

## 2018-03-20 HISTORY — DX: Other specified postprocedural states: Z98.890

## 2018-03-20 HISTORY — DX: Unspecified tear of unspecified meniscus, current injury, left knee, initial encounter: S83.207A

## 2018-03-20 HISTORY — PX: KNEE ARTHROSCOPY WITH LATERAL MENISECTOMY: SHX6193

## 2018-03-20 HISTORY — DX: Nausea with vomiting, unspecified: R11.2

## 2018-03-20 HISTORY — PX: KNEE ARTHROSCOPY WITH ANTERIOR CRUCIATE LIGAMENT (ACL) REPAIR WITH HAMSTRING GRAFT: SHX5645

## 2018-03-20 HISTORY — DX: Sprain of anterior cruciate ligament of left knee, initial encounter: S83.512A

## 2018-03-20 HISTORY — PX: KNEE ARTHROSCOPY WITH MEDIAL MENISECTOMY: SHX5651

## 2018-03-20 SURGERY — ARTHROSCOPY, KNEE, WITH MEDIAL MENISCECTOMY
Anesthesia: General | Site: Knee | Laterality: Left

## 2018-03-20 MED ORDER — ACETAMINOPHEN 500 MG PO TABS
1000.0000 mg | ORAL_TABLET | Freq: Once | ORAL | Status: AC
  Administered 2018-03-20: 1000 mg via ORAL

## 2018-03-20 MED ORDER — CHLORHEXIDINE GLUCONATE 4 % EX LIQD: 60.0000 mL | Freq: Once | CUTANEOUS | Status: DC

## 2018-03-20 MED ORDER — ROPIVACAINE HCL 7.5 MG/ML IJ SOLN
INTRAMUSCULAR | Status: DC | PRN
  Administered 2018-03-20: 20 mL via PERINEURAL

## 2018-03-20 MED ORDER — CEFAZOLIN SODIUM-DEXTROSE 2-4 GM/100ML-% IV SOLN
INTRAVENOUS | Status: AC
  Filled 2018-03-20: qty 100

## 2018-03-20 MED ORDER — LIDOCAINE 2% (20 MG/ML) 5 ML SYRINGE
INTRAMUSCULAR | Status: AC
  Filled 2018-03-20: qty 10

## 2018-03-20 MED ORDER — LACTATED RINGERS IV SOLN: INTRAVENOUS | Status: DC

## 2018-03-20 MED ORDER — PROMETHAZINE HCL 12.5 MG PO TABS: 12.5000 mg | ORAL_TABLET | Freq: Four times a day (QID) | ORAL | 0 refills | Status: DC | PRN

## 2018-03-20 MED ORDER — ACETAMINOPHEN 500 MG PO TABS
ORAL_TABLET | ORAL | Status: AC
  Filled 2018-03-20: qty 2

## 2018-03-20 MED ORDER — OXYCODONE HCL 5 MG PO TABS: 5.0000 mg | ORAL_TABLET | ORAL | 0 refills | Status: DC | PRN

## 2018-03-20 MED ORDER — LACTATED RINGERS IV SOLN
INTRAVENOUS | Status: DC
  Administered 2018-03-20: 10:00:00 via INTRAVENOUS

## 2018-03-20 MED ORDER — BUPIVACAINE HCL (PF) 0.25 % IJ SOLN
INTRAMUSCULAR | Status: AC
  Filled 2018-03-20: qty 30

## 2018-03-20 MED ORDER — ROCURONIUM BROMIDE 50 MG/5ML IV SOSY
PREFILLED_SYRINGE | INTRAVENOUS | Status: AC
  Filled 2018-03-20: qty 5

## 2018-03-20 MED ORDER — PHENYLEPHRINE 40 MCG/ML (10ML) SYRINGE FOR IV PUSH (FOR BLOOD PRESSURE SUPPORT)
PREFILLED_SYRINGE | INTRAVENOUS | Status: AC
  Filled 2018-03-20: qty 10

## 2018-03-20 MED ORDER — CLONIDINE HCL (ANALGESIA) 100 MCG/ML EP SOLN
EPIDURAL | Status: DC | PRN
  Administered 2018-03-20: 100 ug

## 2018-03-20 MED ORDER — DEXAMETHASONE SODIUM PHOSPHATE 10 MG/ML IJ SOLN: 8.0000 mg | Freq: Once | INTRAMUSCULAR | Status: DC

## 2018-03-20 MED ORDER — PROPOFOL 500 MG/50ML IV EMUL
INTRAVENOUS | Status: AC
  Filled 2018-03-20: qty 100

## 2018-03-20 MED ORDER — BUPIVACAINE-EPINEPHRINE 0.25% -1:200000 IJ SOLN
INTRAMUSCULAR | Status: DC | PRN
  Administered 2018-03-20: 20 mL

## 2018-03-20 MED ORDER — GABAPENTIN 300 MG PO CAPS
ORAL_CAPSULE | ORAL | Status: AC
  Filled 2018-03-20: qty 1

## 2018-03-20 MED ORDER — LIDOCAINE HCL (CARDIAC) PF 100 MG/5ML IV SOSY
PREFILLED_SYRINGE | INTRAVENOUS | Status: DC | PRN
  Administered 2018-03-20: 60 mg via INTRAVENOUS

## 2018-03-20 MED ORDER — FENTANYL CITRATE (PF) 100 MCG/2ML IJ SOLN
INTRAMUSCULAR | Status: AC
  Filled 2018-03-20: qty 2

## 2018-03-20 MED ORDER — CEFAZOLIN SODIUM-DEXTROSE 2-4 GM/100ML-% IV SOLN
2.0000 g | INTRAVENOUS | Status: AC
  Administered 2018-03-20: 2 g via INTRAVENOUS

## 2018-03-20 MED ORDER — CELECOXIB 200 MG PO CAPS
200.0000 mg | ORAL_CAPSULE | Freq: Once | ORAL | Status: AC
  Administered 2018-03-20: 200 mg via ORAL

## 2018-03-20 MED ORDER — FENTANYL CITRATE (PF) 100 MCG/2ML IJ SOLN
25.0000 ug | INTRAMUSCULAR | Status: DC | PRN
  Administered 2018-03-20 (×3): 50 ug via INTRAVENOUS

## 2018-03-20 MED ORDER — PROPOFOL 10 MG/ML IV BOLUS
INTRAVENOUS | Status: DC | PRN
  Administered 2018-03-20: 200 mg via INTRAVENOUS

## 2018-03-20 MED ORDER — MIDAZOLAM HCL 2 MG/2ML IJ SOLN
1.0000 mg | INTRAMUSCULAR | Status: DC | PRN
  Administered 2018-03-20: 2 mg via INTRAVENOUS

## 2018-03-20 MED ORDER — MIDAZOLAM HCL 2 MG/2ML IJ SOLN
INTRAMUSCULAR | Status: AC
  Filled 2018-03-20: qty 2

## 2018-03-20 MED ORDER — FENTANYL CITRATE (PF) 100 MCG/2ML IJ SOLN
50.0000 ug | INTRAMUSCULAR | Status: AC | PRN
  Administered 2018-03-20: 25 ug via INTRAVENOUS
  Administered 2018-03-20: 50 ug via INTRAVENOUS
  Administered 2018-03-20: 25 ug via INTRAVENOUS
  Administered 2018-03-20: 100 ug via INTRAVENOUS

## 2018-03-20 MED ORDER — ONDANSETRON HCL 4 MG/2ML IJ SOLN
INTRAMUSCULAR | Status: DC | PRN
  Administered 2018-03-20: 4 mg via INTRAVENOUS

## 2018-03-20 MED ORDER — POVIDONE-IODINE 7.5 % EX SOLN: Freq: Once | CUTANEOUS | Status: DC

## 2018-03-20 MED ORDER — ONDANSETRON HCL 4 MG/2ML IJ SOLN
INTRAMUSCULAR | Status: AC
  Filled 2018-03-20: qty 8

## 2018-03-20 MED ORDER — DEXAMETHASONE SODIUM PHOSPHATE 4 MG/ML IJ SOLN
INTRAMUSCULAR | Status: DC | PRN
  Administered 2018-03-20: 10 mg via INTRAVENOUS

## 2018-03-20 MED ORDER — PROMETHAZINE HCL 25 MG/ML IJ SOLN: 6.2500 mg | INTRAMUSCULAR | Status: DC | PRN

## 2018-03-20 MED ORDER — SCOPOLAMINE 1 MG/3DAYS TD PT72
1.0000 | MEDICATED_PATCH | Freq: Once | TRANSDERMAL | Status: AC | PRN
  Administered 2018-03-20: 1 via TRANSDERMAL

## 2018-03-20 MED ORDER — CELECOXIB 200 MG PO CAPS
ORAL_CAPSULE | ORAL | Status: AC
  Filled 2018-03-20: qty 1

## 2018-03-20 MED ORDER — GABAPENTIN 300 MG PO CAPS
300.0000 mg | ORAL_CAPSULE | Freq: Once | ORAL | Status: AC
  Administered 2018-03-20: 300 mg via ORAL

## 2018-03-20 MED ORDER — DEXAMETHASONE SODIUM PHOSPHATE 10 MG/ML IJ SOLN
INTRAMUSCULAR | Status: AC
  Filled 2018-03-20: qty 1

## 2018-03-20 MED ORDER — SCOPOLAMINE 1 MG/3DAYS TD PT72
MEDICATED_PATCH | TRANSDERMAL | Status: AC
  Filled 2018-03-20: qty 1

## 2018-03-20 MED FILL — oxyCODONE HCL 5 MG TABS: 5 | 4 days supply | Qty: 20 | Fill #0

## 2018-03-20 MED FILL — PROMETHAZINE 12.5 MG TABLET: 12.5 | 4 days supply | Qty: 15 | Fill #0

## 2018-03-20 SURGICAL SUPPLY — 100 items
ALLOGRAFT GRFTLNK IMPLANT SYST (Anchor) IMPLANT
ANCHOR BUTTON TIGHTROPE RN 14 (Anchor) IMPLANT
ANCHOR PUSHLOCK PEEK 3.5X19.5 (Anchor) ×2 IMPLANT
BANDAGE ACE 4X5 VEL STRL LF (GAUZE/BANDAGES/DRESSINGS) ×3 IMPLANT
BANDAGE ACE 6X5 VEL STRL LF (GAUZE/BANDAGES/DRESSINGS) ×3 IMPLANT
BANDAGE ESMARK 6X9 LF (GAUZE/BANDAGES/DRESSINGS) IMPLANT
BENZOIN TINCTURE PRP APPL 2/3 (GAUZE/BANDAGES/DRESSINGS) ×3 IMPLANT
BLADE EXCALIBUR 4.0MM X 13CM (MISCELLANEOUS)
BLADE EXCALIBUR 4.0X13 (MISCELLANEOUS) ×1 IMPLANT
BLADE HEX COATED 2.75 (ELECTRODE) ×2 IMPLANT
BLADE SURG 15 STRL LF DISP TIS (BLADE) ×1 IMPLANT
BLADE SURG 15 STRL SS (BLADE) ×3
BNDG COHESIVE 4X5 TAN STRL (GAUZE/BANDAGES/DRESSINGS) IMPLANT
BNDG ESMARK 6X9 LF (GAUZE/BANDAGES/DRESSINGS)
BURR OVAL 8 FLU 5.0MM X 13CM (MISCELLANEOUS)
BURR OVAL 8 FLU 5.0X13 (MISCELLANEOUS) ×1 IMPLANT
CHLORAPREP W/TINT 26ML (MISCELLANEOUS) ×2 IMPLANT
CLOSURE WOUND 1/2 X4 (GAUZE/BANDAGES/DRESSINGS) ×1
COVER BACK TABLE 60X90IN (DRAPES) ×5 IMPLANT
COVER WAND RF STERILE (DRAPES) IMPLANT
DECANTER SPIKE VIAL GLASS SM (MISCELLANEOUS) IMPLANT
DISSECTOR  3.8MM X 13CM (MISCELLANEOUS) ×2
DISSECTOR 3.8MM X 13CM (MISCELLANEOUS) ×1 IMPLANT
DRAPE ARTHROSCOPY W/POUCH 90 (DRAPES) ×1 IMPLANT
DRAPE IMP U-DRAPE 54X76 (DRAPES) ×3 IMPLANT
DRAPE OEC MINIVIEW 54X84 (DRAPES) ×3 IMPLANT
DRAPE U-SHAPE 47X51 STRL (DRAPES) ×3 IMPLANT
DRAPE U-SHAPE 76X120 STRL (DRAPES) ×1 IMPLANT
DRILL FLIPCUTTER III 6-12 (ORTHOPEDIC DISPOSABLE SUPPLIES) IMPLANT
DRSG PAD ABDOMINAL 8X10 ST (GAUZE/BANDAGES/DRESSINGS) IMPLANT
DURAPREP 26ML APPLICATOR (WOUND CARE) ×1 IMPLANT
ELECT REM PT RETURN 9FT ADLT (ELECTROSURGICAL) ×3
ELECTRODE REM PT RTRN 9FT ADLT (ELECTROSURGICAL) ×1 IMPLANT
EXCALIBUR 3.8MM X 13CM (MISCELLANEOUS) ×3 IMPLANT
FLIPCUTTER III 6-12 AR-1204FF (ORTHOPEDIC DISPOSABLE SUPPLIES) ×3
GAUZE SPONGE 4X4 12PLY STRL (GAUZE/BANDAGES/DRESSINGS) ×3 IMPLANT
GAUZE XEROFORM 1X8 LF (GAUZE/BANDAGES/DRESSINGS) ×3 IMPLANT
GLOVE BIO SURGEON STRL SZ7 (GLOVE) ×2 IMPLANT
GLOVE BIOGEL PI IND STRL 7.0 (GLOVE) ×2 IMPLANT
GLOVE BIOGEL PI IND STRL 7.5 (GLOVE) ×1 IMPLANT
GLOVE BIOGEL PI INDICATOR 7.0 (GLOVE) ×2
GLOVE BIOGEL PI INDICATOR 7.5 (GLOVE) ×2
GLOVE ECLIPSE 6.5 STRL STRAW (GLOVE) ×2 IMPLANT
GLOVE SS BIOGEL STRL SZ 7.5 (GLOVE) ×1 IMPLANT
GLOVE SUPERSENSE BIOGEL SZ 7.5 (GLOVE) ×2
GOWN STRL REUS W/ TWL LRG LVL3 (GOWN DISPOSABLE) ×2 IMPLANT
GOWN STRL REUS W/ TWL XL LVL3 (GOWN DISPOSABLE) ×2 IMPLANT
GOWN STRL REUS W/TWL LRG LVL3 (GOWN DISPOSABLE) ×2
GOWN STRL REUS W/TWL XL LVL3 (GOWN DISPOSABLE)
GRAFT TISS 60-80 FRZN TENDON (Tissue) IMPLANT
HOLDER KNEE FOAM BLUE (MISCELLANEOUS) ×1 IMPLANT
IMMOBILIZER KNEE 22 UNIV (SOFTGOODS) IMPLANT
IMMOBILIZER KNEE 24 THIGH 36 (MISCELLANEOUS) IMPLANT
IMMOBILIZER KNEE 24 UNIV (MISCELLANEOUS) ×3
K-WIRE .062X4 (WIRE) IMPLANT
KNEE WRAP E Z 3 GEL PACK (MISCELLANEOUS) ×3 IMPLANT
MANIFOLD NEPTUNE II (INSTRUMENTS) ×3 IMPLANT
MARKER SKIN DUAL TIP RULER LAB (MISCELLANEOUS) ×3 IMPLANT
NDL SAFETY ECLIPSE 18X1.5 (NEEDLE) ×2 IMPLANT
NEEDLE HYPO 18GX1.5 SHARP (NEEDLE) ×6
NEEDLE HYPO 22GX1.5 SAFETY (NEEDLE) ×2 IMPLANT
PACK ARTHROSCOPY DSU (CUSTOM PROCEDURE TRAY) ×1 IMPLANT
PACK BASIN DAY SURGERY FS (CUSTOM PROCEDURE TRAY) ×3 IMPLANT
PAD ALCOHOL SWAB (MISCELLANEOUS) IMPLANT
PAD CAST 4YDX4 CTTN HI CHSV (CAST SUPPLIES) IMPLANT
PADDING CAST COTTON 4X4 STRL (CAST SUPPLIES)
PENCIL BUTTON HOLSTER BLD 10FT (ELECTRODE) ×2 IMPLANT
PIN DRILL ACL TIGHTROPE 4MM (PIN) ×2 IMPLANT
PK GRAFTLINK ALLO IMPLANT SYST (Anchor) ×3 IMPLANT
PORT APPOLLO RF 90DEGREE MULTI (SURGICAL WAND) ×2 IMPLANT
SLEEVE SCD COMPRESS KNEE MED (MISCELLANEOUS) ×3 IMPLANT
SPONGE LAP 4X18 RFD (DISPOSABLE) ×3 IMPLANT
STOCKING TED THIGH LEN LRG REG (STOCKING)
STOCKING TED THIGH LEN MED REG (STOCKING)
STOCKING THIGH LG REG (STOCKING) IMPLANT
STOCKING THIGH MED REG (STOCKING) IMPLANT
STRIP CLOSURE SKIN 1/2X4 (GAUZE/BANDAGES/DRESSINGS) ×2 IMPLANT
SUCTION FRAZIER HANDLE 10FR (MISCELLANEOUS) ×2
SUCTION TUBE FRAZIER 10FR DISP (MISCELLANEOUS) ×1 IMPLANT
SUT 0 FIBERLOOP 38 BLUE TPR ND (SUTURE)
SUT ETHILON 4 0 PS 2 18 (SUTURE) ×3 IMPLANT
SUT FIBERWIRE #2 38 T-5 BLUE (SUTURE)
SUT PDS AB 0 CT 36 (SUTURE) IMPLANT
SUT PROLENE 3 0 PS 2 (SUTURE) ×3 IMPLANT
SUT VIC AB 0 CT1 18XCR BRD 8 (SUTURE) IMPLANT
SUT VIC AB 0 CT1 8-18 (SUTURE)
SUT VIC AB 2-0 CT1 27 (SUTURE)
SUT VIC AB 2-0 CT1 TAPERPNT 27 (SUTURE) IMPLANT
SUT VIC AB 3-0 PS1 18 (SUTURE) ×2
SUT VIC AB 3-0 PS1 18XBRD (SUTURE) IMPLANT
SUT VIC AB 3-0 SH 27 (SUTURE) ×2
SUT VIC AB 3-0 SH 27X BRD (SUTURE) ×1 IMPLANT
SUTURE 0 FIBERLP 38 BLU TPR ND (SUTURE) ×2 IMPLANT
SUTURE FIBERWR #2 38 T-5 BLUE (SUTURE) IMPLANT
SYR 20CC LL (SYRINGE) IMPLANT
SYR 5ML LL (SYRINGE) ×3 IMPLANT
TISSUE GRAFTLINK FGL (Tissue) ×3 IMPLANT
TOWEL GREEN STERILE FF (TOWEL DISPOSABLE) ×6 IMPLANT
TUBING ARTHROSCOPY IRRIG 16FT (MISCELLANEOUS) ×3 IMPLANT
WATER STERILE IRR 1000ML POUR (IV SOLUTION) ×1 IMPLANT

## 2018-03-20 NOTE — Anesthesia Preprocedure Evaluation (Signed)
Anesthesia Evaluation  Patient identified by MRN, date of birth, ID band Patient awake    Reviewed: Allergy & Precautions, NPO status , Patient's Chart, lab work & pertinent test results  Airway Mallampati: II  TM Distance: >3 FB Neck ROM: Full    Dental  (+) Teeth Intact, Dental Advisory Given   Pulmonary neg pulmonary ROS,    Pulmonary exam normal breath sounds clear to auscultation       Cardiovascular Exercise Tolerance: Good negative cardio ROS Normal cardiovascular exam Rhythm:Regular Rate:Normal     Neuro/Psych negative neurological ROS  negative psych ROS   GI/Hepatic negative GI ROS, Neg liver ROS,   Endo/Other  negative endocrine ROS  Renal/GU negative Renal ROS     Musculoskeletal  (+) Arthritis ,  LEFT KNEE MEDIAL AND LATERAL MENISCUS TEARS   Abdominal   Peds  Hematology negative hematology ROS (+)   Anesthesia Other Findings Day of surgery medications reviewed with the patient.  Reproductive/Obstetrics                            Anesthesia Physical Anesthesia Plan  ASA: I  Anesthesia Plan: General   Post-op Pain Management:  Regional for Post-op pain   Induction: Intravenous  PONV Risk Score and Plan: 3 and Midazolam, Dexamethasone and Ondansetron  Airway Management Planned: LMA  Additional Equipment:   Intra-op Plan:   Post-operative Plan: Extubation in OR  Informed Consent: I have reviewed the patients History and Physical, chart, labs and discussed the procedure including the risks, benefits and alternatives for the proposed anesthesia with the patient or authorized representative who has indicated his/her understanding and acceptance.     Dental advisory given  Plan Discussed with: CRNA  Anesthesia Plan Comments:         Anesthesia Quick Evaluation

## 2018-03-20 NOTE — Anesthesia Postprocedure Evaluation (Signed)
Anesthesia Post Note  Patient: Joshua Hubbard  Procedure(s) Performed: KNEE ARTHROSCOPY WITH MEDIAL MENISECTOMY (Left Knee) KNEE ARTHROSCOPY WITH LATERAL MENISECTOMY (Left Knee) KNEE ARTHROSCOPY WITH REVESION ANTERIOR CRUCIATE LIGAMENT (ACL) REPAIR WITH  HAMSTRING ALLOGRAFT, (Left Knee)     Patient location during evaluation: PACU Anesthesia Type: General Level of consciousness: awake and alert, awake and oriented Pain management: pain level controlled Vital Signs Assessment: post-procedure vital signs reviewed and stable Respiratory status: spontaneous breathing, nonlabored ventilation and respiratory function stable Cardiovascular status: blood pressure returned to baseline and stable Postop Assessment: no apparent nausea or vomiting Anesthetic complications: no    Last Vitals:  Vitals:   03/20/18 1400 03/20/18 1415  BP: (!) 137/98 (!) 134/92  Pulse: 92 92  Resp: 14 17  Temp:    SpO2: 94% 96%    Last Pain:  Vitals:   03/20/18 1400  TempSrc:   PainSc: [place]

## 2018-03-20 NOTE — Discharge Instructions (Signed)

## 2018-03-20 NOTE — Anesthesia Procedure Notes (Signed)
Anesthesia Regional Block: Adductor canal block   Pre-Anesthetic Checklist: ,, timeout performed, Correct Patient, Correct Site, Correct Laterality, Correct Procedure, Correct Position, site marked, Risks and benefits discussed,  Surgical consent,  Pre-op evaluation,  At surgeon's request and post-op pain management  Laterality: Left  Prep: chloraprep       Needles:  Injection technique: Single-shot  Needle Type: Echogenic Needle     Needle Length: 9cm  Needle Gauge: 21     Additional Needles:   Procedures:,,,, ultrasound used (permanent image in chart),,,,  Narrative:  Start time: 03/20/2018 10:55 AM End time: 03/20/2018 11:00 AM Injection made incrementally with aspirations every 5 mL.  Performed by: Personally  Anesthesiologist: Catalina Gravel, MD  Additional Notes: No pain on injection. No increased resistance to injection. Injection made in 5cc increments.  Good needle visualization.  Patient tolerated procedure well.

## 2018-03-20 NOTE — Transfer of Care (Signed)
Immediate Anesthesia Transfer of Care Note  Patient: Joshua Hubbard  Procedure(s) Performed: KNEE ARTHROSCOPY WITH MEDIAL MENISECTOMY (Left Knee) KNEE ARTHROSCOPY WITH LATERAL MENISECTOMY (Left Knee) KNEE ARTHROSCOPY WITH REVESION ANTERIOR CRUCIATE LIGAMENT (ACL) REPAIR WITH  HAMSTRING ALLOGRAFT, (Left Knee)  Patient Location: PACU  Anesthesia Type:General  Level of Consciousness: awake  Airway & Oxygen Therapy: Patient Spontanous Breathing and Patient connected to face mask oxygen  Post-op Assessment: Report given to RN and Post -op Vital signs reviewed and stable  Post vital signs: Reviewed and stable  Last Vitals:  Vitals Value Taken Time  BP 116/85 03/20/2018  1:39 PM  Temp    Pulse 100 03/20/2018  1:41 PM  Resp 18 03/20/2018  1:41 PM  SpO2 96 % 03/20/2018  1:41 PM  Vitals shown include unvalidated device data.  Last Pain:  Vitals:   03/20/18 0951  TempSrc: Oral  PainSc: 1          Complications: No apparent anesthesia complications

## 2018-03-20 NOTE — Progress Notes (Signed)
AssistedDr. Turk with left, ultrasound guided, adductor canal block. Side rails up, monitors on throughout procedure. See vital signs in flow sheet. Tolerated Procedure well.  

## 2018-03-20 NOTE — Anesthesia Procedure Notes (Signed)
Procedure Name: LMA Insertion Date/Time: 03/20/2018 11:58 AM Performed by: Signe Colt, CRNA Pre-anesthesia Checklist: Patient identified, Emergency Drugs available, Suction available and Patient being monitored Patient Re-evaluated:Patient Re-evaluated prior to induction Oxygen Delivery Method: Circle system utilized Preoxygenation: Pre-oxygenation with 100% oxygen Induction Type: IV induction Ventilation: Mask ventilation without difficulty LMA: LMA inserted LMA Size: 4.0 Number of attempts: 1 Airway Equipment and Method: Bite block Placement Confirmation: positive ETCO2 Tube secured with: Tape Dental Injury: Teeth and Oropharynx as per pre-operative assessment

## 2018-03-20 NOTE — Interval H&P Note (Signed)
History and Physical Interval Note:  03/20/2018 11:51 AM  Joshua Hubbard  has presented today for surgery, with the diagnosis of LEFT KNEE MEDIAL AND LATERAL MENISCUS TEARS,  The various methods of treatment have been discussed with the patient and family. After consideration of risks, benefits and other options for treatment, the patient has consented to  Procedure(s): KNEE ARTHROSCOPY WITH MEDIAL MENISECTOMY VERSES REPAIR (Left) KNEE ARTHROSCOPY WITH LATERAL MENISECTOMY VERSES REPAIR (Left) KNEE ARTHROSCOPY WITH REVESION ANTERIOR CRUCIATE LIGAMENT (ACL) REPAIR WITH  HAMSTRING ALLOGRAFT, (Left) as a surgical intervention .  The patient's history has been reviewed, patient examined, no change in status, stable for surgery.  I have reviewed the patient's chart and labs.  Questions were answered to the patient's satisfaction.     Lorn Junes

## 2018-03-20 NOTE — Op Note (Signed)
NAME: Joshua Hubbard, Joshua Hubbard MEDICAL RECORD CW:[id number] ACCOUNT [id number] DATE OF BIRTH:[birth year] FACILITY: [facility] LOCATION: [facility] PHYSICIAN:Azora Bonzo Venetia Maxon, MD  OPERATIVE REPORT  DATE OF PROCEDURE:  03/20/2018  PREOPERATIVE DIAGNOSES: 1.  Left knee acute traumatic anterior cruciate ligament graft tear. 2.  Left knee acute traumatic medial and lateral meniscal tears.  POSTOPERATIVE DIAGNOSES: 1.  Left knee acute traumatic anterior cruciate ligament graft tear. 2.  Left knee acute traumatic medial and lateral meniscal tears.  PROCEDURE PERFORMED: 1.  Left knee exam under anesthesia followed by arthroscopically assisted endoscopic hamstring allograft revision, anterior cruciate ligament reconstruction using Arthrex femoral TightRope with Arthrex tibial button plus PushLock anchor. 2.  Left knee partial medial and lateral meniscectomies.  SURGEON:  Elsie Saas, MD  ASSISTANT:  Joya Gaskins, OPA  ANESTHESIA:  General.  OPERATIVE TIME:  One hour and 15 minutes.  COMPLICATIONS:  None.  INDICATIONS:  This is a 43 year old who sustained a twisting, pivoting injury snow skiing approximately 4 weeks ago.  Exam and MRIs revealed a complete ACL graft tear with medial and lateral meniscal tears.  He is now to undergo arthroscopy with ACL  revision reconstruction and attention to his meniscal pathology.  DESCRIPTION OF PROCEDURE:  The patient was brought to the operating room on 03/20/2018 after an adductor canal block was placed in the holding room by anesthesia.  He was placed on the operating table in supine position.  He received antibiotics  preoperatively for prophylaxis.  After being placed under general anesthesia, his left knee was examined.  He had full range of motion, 3+ Lachman, positive pivot shift.  Knee stable to varus, valgus, anterior and posterior stress with normal patellar  tracking.  The knee was sterilely injected with 0.25% Marcaine with epinephrine.  Left leg was  then prepped using sterile ChloraPrep and draped using sterile technique.  Time-out procedure was called and the correct left knee identified.  Initially  through an anterolateral portal, the arthroscope with a pump attached was placed in through an anteromedial portal, and an arthroscopic probe was placed.  On initial inspection of medial compartment, he had grade I and II chondromalacia.  Medial meniscus  showed a partial tear of the posterior horn 20%, which was resected back to a stable rim.  Intercondylar notch was inspected.  His ACL graft was completely torn in the midsubstance with significant anterior laxity and this was thoroughly debrided.  The  PCL was intact and stable.  Lateral compartment showed grade I and II chondromalacia.  Lateral meniscus showed a tear of the posterior lateral horn 20%, which was resected back to a stable rim.  Patellofemoral joint articular cartilage was intact.  The  patella tracked normally.  Medial and lateral gutters were free of pathology.  At this point, Joya Gaskins, whose surgical and medical assistance was absolutely surgically and medically necessary.  He prepared the ACL allograft on the back table while I  prepared the inside of the knee to accept this graft.  Through the patient's previous 3 cm anteromedial incision a new incision was made.  Underlying subcutaneous tissues were incised in line with the skin incision.  Careful dissection was carried out  such that we found his previous tibial button and this was removed.  Using fluoroscopic control, a guide pin was placed through his previous tibial tunnel and confirmed with intraoperative fluoroscopy and then overdrilled first with a 9 mm, then a 10 mm  drill thus cleaning out his old graft from this tibial tunnel.  Through  the tibial tunnel the guide pin was placed into the femoral tunnel and again confirmed with intraoperative fluoroscopy and then overdrilled with first a 9 mm drill then a 10 mm drill  to  a depth of 23 mm, leaving a posterior 2 mm bone bridge and again confirmed with intraoperative fluoroscopy and the excess graft from his previous reconstruction was removed from the femoral tunnel.  After this was done, a double pin passer was  brought up through the tibial tunnel and joint and up through the femoral tunnel and through the femoral cortex and thigh through a stab wound.  This was used to pass the Arthrex TightRope and graft up through the tibial tunnel and joint and up into the  femoral tunnel.  The TightRope was then deployed on the lateral femoral cortex and confirmed with intraoperative fluoroscopy.  The femoral end of the graft was then deployed up into the femoral tunnel with excellent fixation.  Knee was brought through a  full range of motion.  There was found to be no impingement of the graft.  The tibial end of the graft was then secured with a tibial button while Joya Gaskins held the tibia reduced on the femur in 30 degrees of flexion.  The tibial end of the graft  was then further secured with a PushLock anchor.  After this was done, the knee was tested for stability.  Again, Lachman and pivot shift were totally eliminated and the knee could be brought through a full range of motion with no impingement of the  graft.  At this point, it was felt that all pathology had been satisfactorily addressed.  The instruments were removed.  The anteromedial incision was closed with 2-0 Vicryl and 4-0 Prolene.  Arthroscopic portals closed with 4-0 Prolene.  Sterile  dressings were applied and long leg splint and the patient awakened and taken to recovery room in stable condition.  Needle and sponge count was correct x2 at the end of the case.  FOLLOWUP CARE:  The patient will be followed as an outpatient on oxycodone and Phenergan with a home CPM.  He will be seen back in the office in a week for sutures out and followup.  TN/NUANCE  D:03/20/2018 T:03/20/2018 JOB:[id number]

## 2018-03-21 ENCOUNTER — Encounter (HOSPITAL_BASED_OUTPATIENT_CLINIC_OR_DEPARTMENT_OTHER): Payer: Self-pay | Admitting: Orthopedic Surgery

## 2018-03-27 ENCOUNTER — Other Ambulatory Visit (HOSPITAL_COMMUNITY): Payer: Self-pay | Admitting: Orthopedic Surgery

## 2018-03-27 ENCOUNTER — Ambulatory Visit (HOSPITAL_COMMUNITY)
Admission: RE | Admit: 2018-03-27 | Discharge: 2018-03-27 | Disposition: A | Discharge status: Home / Self Care | Payer: 59 | Source: Ambulatory Visit | Attending: Orthopedic Surgery | Admitting: Orthopedic Surgery

## 2018-03-27 DIAGNOSIS — M7989 Other specified soft tissue disorders: Secondary | ICD-10-CM | POA: Diagnosis not present

## 2018-03-27 DIAGNOSIS — S83282D Other tear of lateral meniscus, current injury, left knee, subsequent encounter: Secondary | ICD-10-CM | POA: Diagnosis not present

## 2018-03-27 DIAGNOSIS — M79604 Pain in right leg: Secondary | ICD-10-CM

## 2018-03-27 DIAGNOSIS — S83242D Other tear of medial meniscus, current injury, left knee, subsequent encounter: Secondary | ICD-10-CM | POA: Diagnosis not present

## 2018-03-27 NOTE — Progress Notes (Signed)
Left lower extremity venous duplex exam completed. Please see preliminary notes on CV PROC under chart review. Result called ordering physician's office and spoke with Darcy(RN). Doralee Kocak H Tina Gruner(RDMS RVT) 03/27/18 1:42 PM

## 2018-03-29 ENCOUNTER — Ambulatory Visit (INDEPENDENT_AMBULATORY_CARE_PROVIDER_SITE_OTHER): Payer: 59 | Admitting: *Deleted

## 2018-03-29 ENCOUNTER — Ambulatory Visit (INDEPENDENT_AMBULATORY_CARE_PROVIDER_SITE_OTHER): Payer: 59 | Admitting: Family Medicine

## 2018-03-29 ENCOUNTER — Other Ambulatory Visit: Payer: Self-pay | Admitting: Family Medicine

## 2018-03-29 DIAGNOSIS — R05 Cough: Secondary | ICD-10-CM | POA: Diagnosis not present

## 2018-03-29 DIAGNOSIS — L03116 Cellulitis of left lower limb: Secondary | ICD-10-CM | POA: Insufficient documentation

## 2018-03-29 DIAGNOSIS — R059 Cough, unspecified: Secondary | ICD-10-CM

## 2018-03-29 DIAGNOSIS — S83282D Other tear of lateral meniscus, current injury, left knee, subsequent encounter: Secondary | ICD-10-CM | POA: Diagnosis not present

## 2018-03-29 DIAGNOSIS — S83242D Other tear of medial meniscus, current injury, left knee, subsequent encounter: Secondary | ICD-10-CM | POA: Diagnosis not present

## 2018-03-29 LAB — POCT INFLUENZA A/B
Influenza A, POC: NEGATIVE
Influenza B, POC: NEGATIVE

## 2018-03-29 LAB — CBC
Hematocrit: 48.5 % (ref 37.5–51.0)
Hemoglobin: 16.7 g/dL (ref 13.0–17.7)
MCH: 34.2 pg — ABNORMAL HIGH (ref 26.6–33.0)
MCHC: 34.4 g/dL (ref 31.5–35.7)
MCV: 99 fL — ABNORMAL HIGH (ref 79–97)
Platelets: 233 10*3/uL (ref 150–450)
RBC: 4.89 x10E6/uL (ref 4.14–5.80)
RDW: 13.1 % (ref 11.6–15.4)
WBC: 7.5 10*3/uL (ref 3.4–10.8)

## 2018-03-29 MED ORDER — CEFTRIAXONE SODIUM 250 MG IJ SOLR
250.0000 mg | Freq: Once | INTRAMUSCULAR | Status: AC
  Administered 2018-03-29: 250 mg via INTRAMUSCULAR

## 2018-03-29 NOTE — Progress Notes (Signed)
    CHIEF COMPLAINT / HPI: Fever yesterday at home to 101.5 with sweats. Improved with tylenol. Also started having significant left knee pain yesterday afternoon, on lateral portion, with development of redness around left knee. Also now notes significant bruising into medial groin. He had left knee ACL reconstruction on 03/20/2018. Saw his orthopedist today and they thought he might have influenza so he had rapid flu test which was negative. Also had lower extremity doppler done on 03/27/2018. (negative)  REVIEW OF SYSTEMS: See HPI. Denies SOB, no cough, no abdominal pain, no nausea or vomiting, no chills.  PERTINENT  PMH / PSH: I have reviewed the patient's medications, allergies, past medical and surgical history, smoking status and updated in the EMR as appropriate. Arthroscopy knee left 03/20/2018 Nonsmoker No personal hx DM  OBJECTIVE:  Vital signs reviewed. GENERAL: Well-developed, well-nourished, no acute distress.Does not appear to feel well. CARDIOVASCULAR: Regular rate and rhythm no murmur gallop or rub LUNGS: Clear to auscultation bilaterally, no rales or wheeze. ABDOMEN: Soft positive bowel sounds MSK: Movement of extremity x 4.  Left knee has essentially FROm although mild loss of extension secondary to recent surgical cha\ges. Portal sites are healing well without any specific redness or tenderness or discharge.  SKIN: There is redness and increased warmth in an irregular pattern on medial/anterior and lateral portions of knee. Red and  Purple bruising in medial proximal thigh--no groin tenderness to palpation.  soft calf and popliteal space. VASC DP 2+ B=    ASSESSMENT / PLAN:  Cellulitis of left lower extremity Odd presentation. Fever could be viral certainly. Will get CBC SED and CRP. I offered overnight  (at least) observation with blood culture, probably aspiration of knee, empiric IV antibiotics. He really wanted to avoid that so we compromised on gettting lab work, Im  CTX, discussed "red flags" and he has my phone number. Will check in with him in AM. Greater than 50% of our 25 minute office visit was spent in counseling and education regarding these issues.

## 2018-03-29 NOTE — Assessment & Plan Note (Signed)
Odd presentation. Fever could be viral certainly. Will get CBC SED and CRP. I offered overnight  (at least) observation with blood culture, probably aspiration of knee, empiric IV antibiotics. He really wanted to avoid that so we compromised on gettting lab work, Im CTX, discussed "red flags" and he has my phone number. Will check in with him in AM. Greater than 50% of our 25 minute office visit was spent in counseling and education regarding these issues.

## 2018-03-29 NOTE — Progress Notes (Signed)
Negative for flu.  Pt informed. Fleeger, Salome Spotted, CMA

## 2018-03-30 LAB — C-REACTIVE PROTEIN: CRP: 16 mg/L — ABNORMAL HIGH (ref 0–10)

## 2018-03-30 LAB — SEDIMENTATION RATE: Sed Rate: 10 mm/hr (ref 0–15)

## 2018-04-05 DIAGNOSIS — M6281 Muscle weakness (generalized): Secondary | ICD-10-CM | POA: Diagnosis not present

## 2018-04-05 DIAGNOSIS — M25662 Stiffness of left knee, not elsewhere classified: Secondary | ICD-10-CM | POA: Diagnosis not present

## 2018-04-05 DIAGNOSIS — R262 Difficulty in walking, not elsewhere classified: Secondary | ICD-10-CM | POA: Diagnosis not present

## 2018-04-05 DIAGNOSIS — S83512D Sprain of anterior cruciate ligament of left knee, subsequent encounter: Secondary | ICD-10-CM | POA: Diagnosis not present

## 2018-04-10 DIAGNOSIS — S83512D Sprain of anterior cruciate ligament of left knee, subsequent encounter: Secondary | ICD-10-CM | POA: Diagnosis not present

## 2018-04-10 DIAGNOSIS — M25562 Pain in left knee: Secondary | ICD-10-CM | POA: Diagnosis not present

## 2018-04-10 DIAGNOSIS — M25662 Stiffness of left knee, not elsewhere classified: Secondary | ICD-10-CM | POA: Diagnosis not present

## 2018-04-10 DIAGNOSIS — M6281 Muscle weakness (generalized): Secondary | ICD-10-CM | POA: Diagnosis not present

## 2018-04-12 DIAGNOSIS — M25662 Stiffness of left knee, not elsewhere classified: Secondary | ICD-10-CM | POA: Diagnosis not present

## 2018-05-15 DIAGNOSIS — S83512D Sprain of anterior cruciate ligament of left knee, subsequent encounter: Secondary | ICD-10-CM | POA: Diagnosis not present

## 2018-06-21 DIAGNOSIS — S83512D Sprain of anterior cruciate ligament of left knee, subsequent encounter: Secondary | ICD-10-CM | POA: Diagnosis not present

## 2018-07-02 ENCOUNTER — Other Ambulatory Visit: Payer: Self-pay | Admitting: Family Medicine

## 2018-07-23 ENCOUNTER — Other Ambulatory Visit: Payer: Self-pay

## 2018-07-23 ENCOUNTER — Ambulatory Visit (INDEPENDENT_AMBULATORY_CARE_PROVIDER_SITE_OTHER): Payer: 59 | Admitting: Sports Medicine

## 2018-07-23 VITALS — BP 120/62 | Ht 69.0 in | Wt 185.0 lb

## 2018-07-23 DIAGNOSIS — S20212A Contusion of left front wall of thorax, initial encounter: Secondary | ICD-10-CM | POA: Diagnosis not present

## 2018-07-24 NOTE — Progress Notes (Signed)
   Subjective:    Patient ID: Joshua Hubbard, male    DOB: Nov 08, 1975, 43 y.o.   MRN: [id number]  HPI chief complaint: Mid back pain  Joshua Hubbard comes in today complaining of 3 days of left-sided mid back pain that began on Saturday.  While fishing, he lost his balance and struck the right mid back area against the boat.  Since then, he has had intermittent pain which is most noticeable with very deep breathing or with coughing.  Localizes his pain just to the left of midline around the mid back area.  He denies any shortness of breath.  No pain with regular respirations.  No significant pain at night.  Denies pain elsewhere.   Review of Systems    As above Objective:   Physical Exam  Well-developed, well-nourished.  No acute distress.  Awake alert and oriented x3.  Vital signs reviewed.  Patient is breathing comfortably.  Mid thoracic spine: Patient is tender to palpation just to the left of the T8-T9 midline area.  Is tender to palpation over the paraspinal musculature as well as over the eighth and ninth ribs.  Small amount of ecchymosis present.  No other tenderness to palpation.  Limited bedside MSK ultrasound of the most painful area shows no evidence of rib fracture or hematoma formation.     Assessment & Plan:   Left-sided mid back pain secondary to contusion  Reassurance regarding ultrasound.  Symptoms should improve over the next 2 to 3 weeks.  If they persist or worsen, I would consider merits of rib x-rays.  Follow-up for ongoing or recalcitrant issues.  This note was dictated using Dragon naturally speaking software and may contain errors in syntax, spelling, or content which have not been identified prior to signing this note.

## 2018-07-31 ENCOUNTER — Other Ambulatory Visit: Payer: Self-pay | Admitting: Sports Medicine

## 2018-07-31 ENCOUNTER — Other Ambulatory Visit: Payer: Self-pay

## 2018-07-31 ENCOUNTER — Ambulatory Visit
Admission: RE | Admit: 2018-07-31 | Discharge: 2018-07-31 | Disposition: A | Discharge status: Home / Self Care | Payer: 59 | Source: Ambulatory Visit | Attending: Sports Medicine | Admitting: Sports Medicine

## 2018-07-31 ENCOUNTER — Ambulatory Visit (INDEPENDENT_AMBULATORY_CARE_PROVIDER_SITE_OTHER): Payer: 59 | Admitting: Sports Medicine

## 2018-07-31 VITALS — BP 130/80

## 2018-07-31 DIAGNOSIS — S20212A Contusion of left front wall of thorax, initial encounter: Secondary | ICD-10-CM | POA: Diagnosis not present

## 2018-07-31 DIAGNOSIS — S299XXA Unspecified injury of thorax, initial encounter: Secondary | ICD-10-CM | POA: Diagnosis not present

## 2018-07-31 DIAGNOSIS — R0781 Pleurodynia: Secondary | ICD-10-CM | POA: Diagnosis not present

## 2018-07-31 IMAGING — CR LEFT RIBS AND CHEST - 3+ VIEW
3 series · 3 of 3 positions shown · non-contrast
Comparison: None.

CLINICAL DATA: Left lower rib pain since a fall 2 weeks ago.
Initial encounter.

EXAM:
LEFT RIBS AND CHEST - 3+ VIEW

[w chest pa]
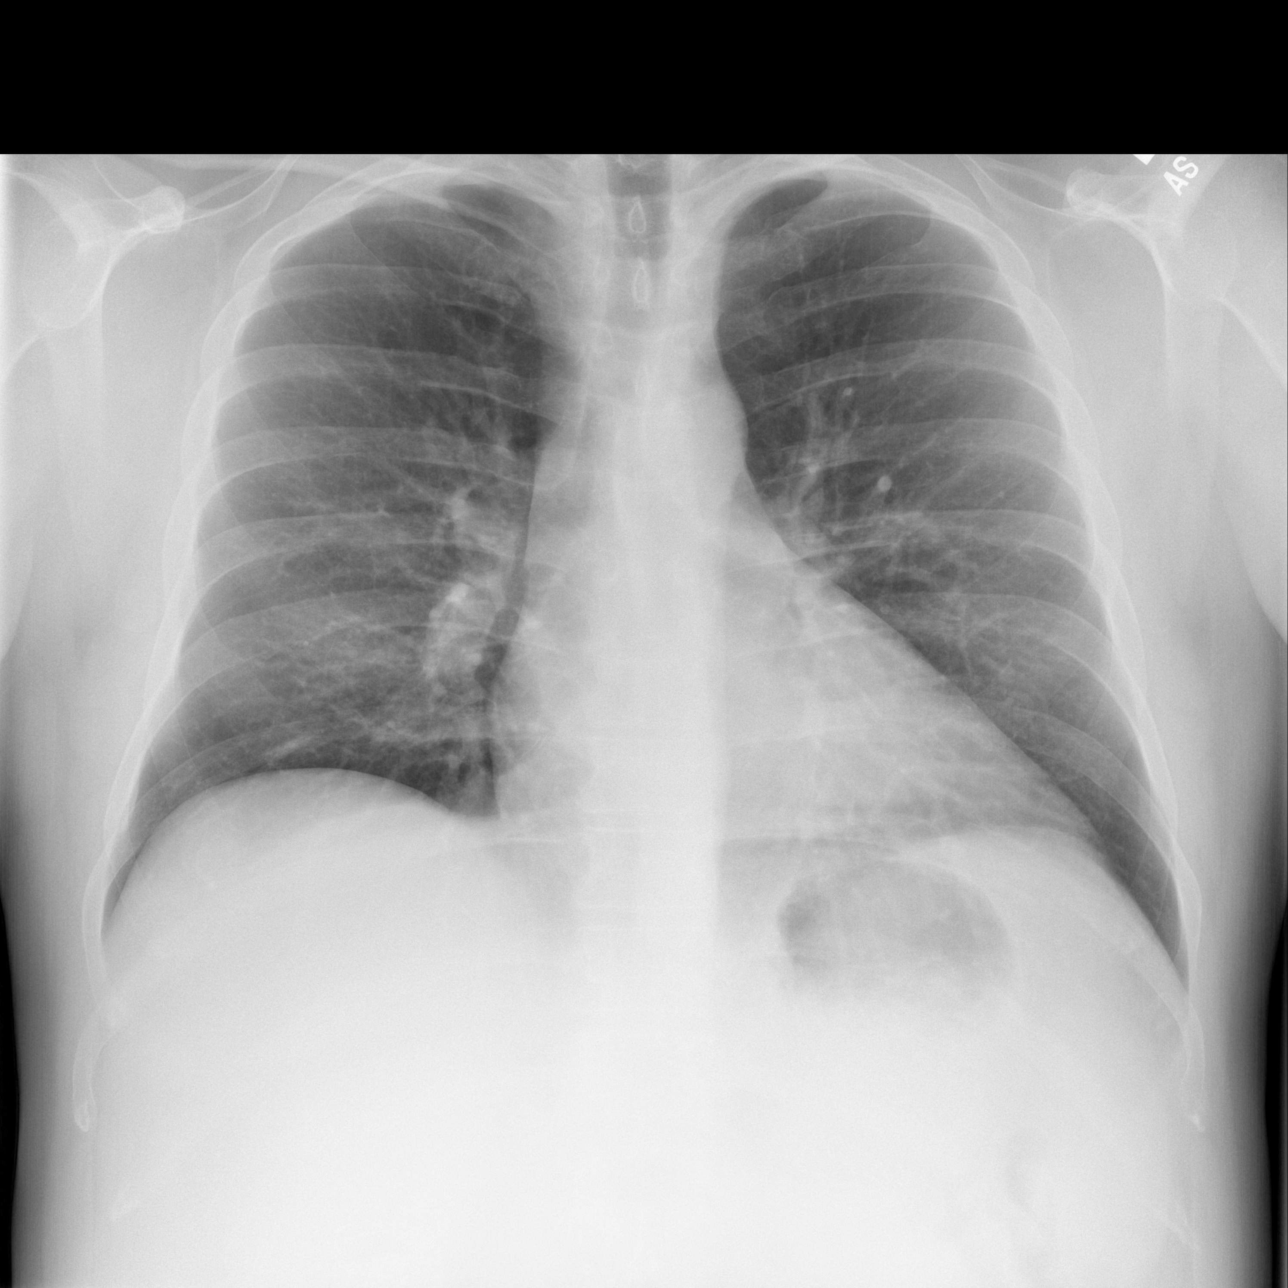

[w ribs ap/pa lower left *]
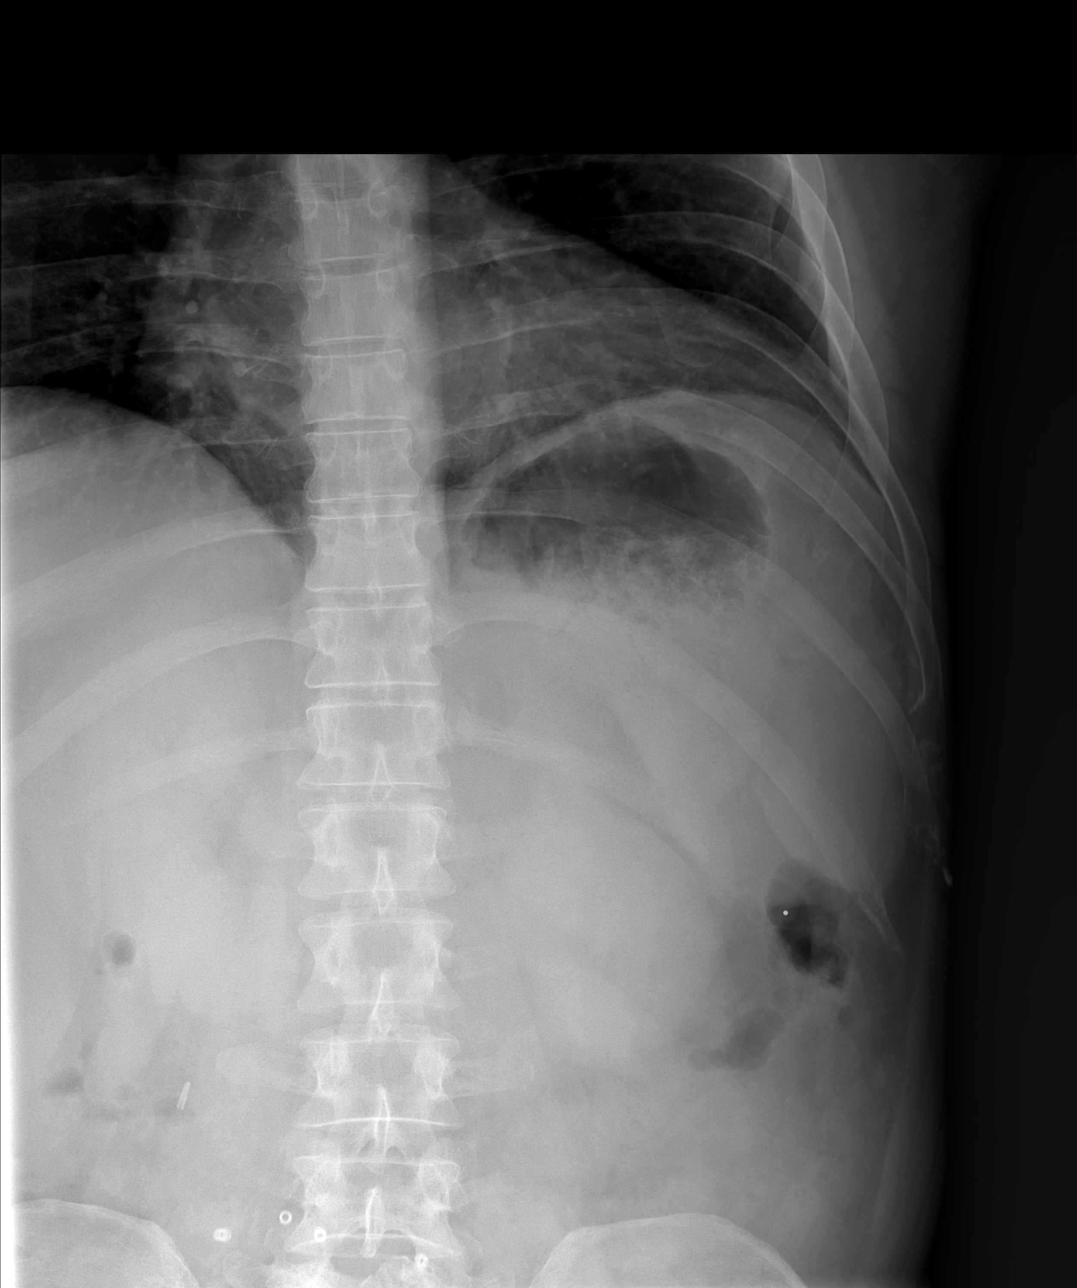

[w ribs oblique left *]
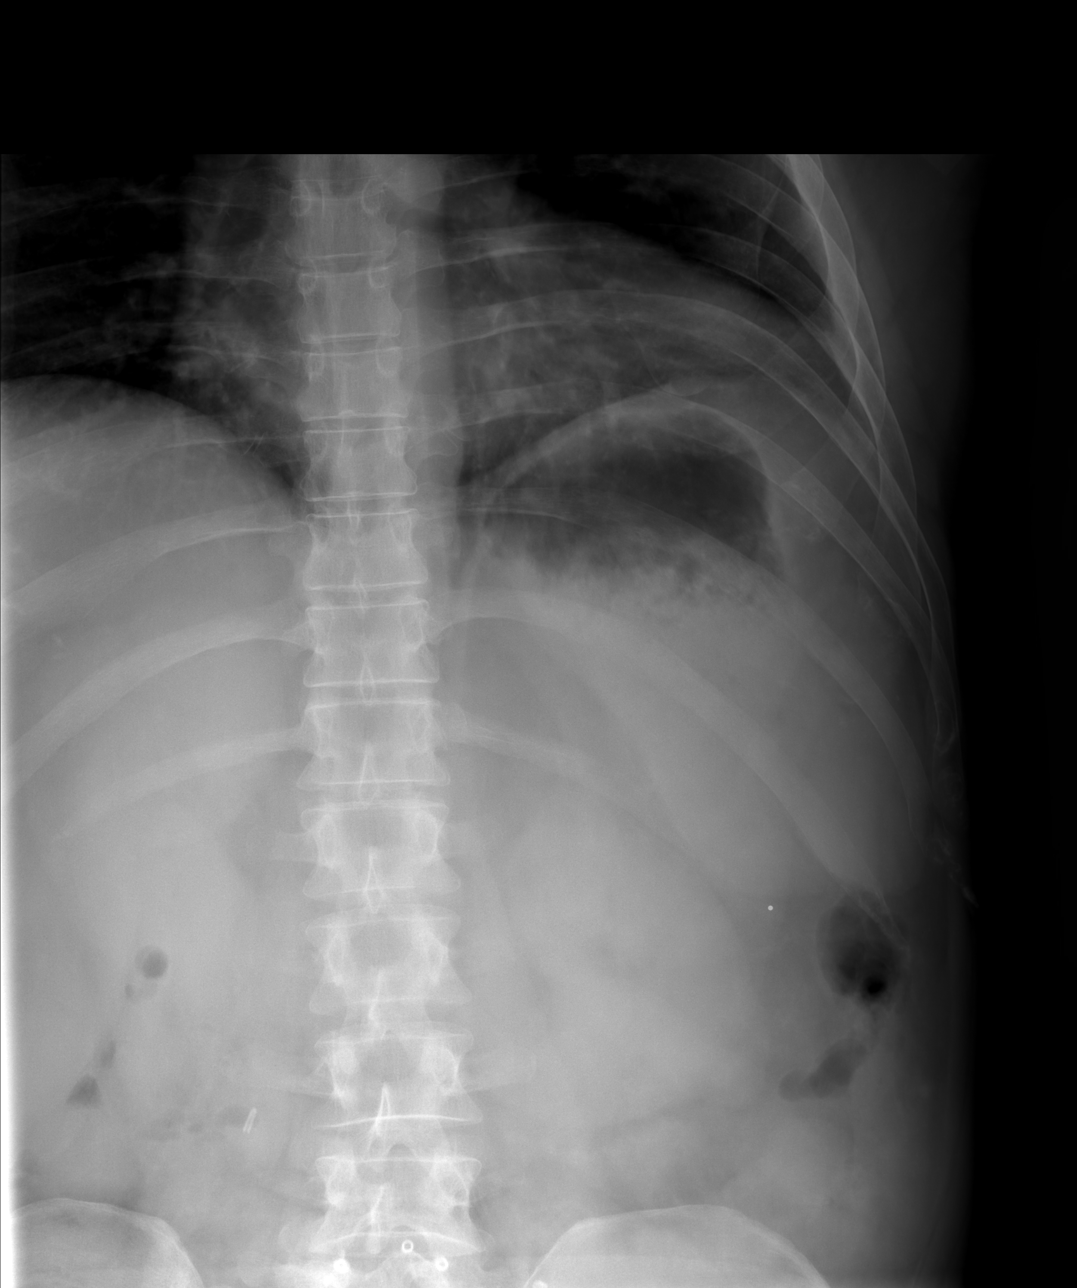

[3 of 3 positions shown; findings below may reference images not displayed]

FINDINGS: The lungs are clear. Heart size is normal. No pneumothorax or
pleural effusion. No rib fracture or other focal bony abnormality is
seen.
IMPRESSION: Negative for fracture.  Negative exam.

## 2018-07-31 NOTE — Progress Notes (Signed)
   Subjective:    Patient ID: REG BIRCHER, male    DOB: [birth year], 43 y.o.   MRN: [id number]  HPI chief complaint: Left-sided mid back pain  Patient comes in today with persistent left-sided mid back pain.  He was last seen in the office last week.  Injured himself by falling while fishing and striking the left side of his mid to lower back against the side of the boat.  Ultrasound evaluation done at last week's office visit showed no obvious fracture.  Main complaint is pain at night.  Minimal pain during the day.  Denies shortness of breath.  Denies hematuria.  Denies pain elsewhere.  He localizes his pain to the left of midline near the 12th rib.   Review of Systems As above    Objective:   Physical Exam  Well-developed, well-nourished.  No acute distress.  Sitting comfortably in the exam room  Examination of the left mid back does show some tenderness to palpation around the 11th and 12th ribs.  Small area of ecchymosis persists.  No other bony or soft tissue tenderness to palpation.  No spasm.  X-rays of the left side of his rib cage show no obvious fracture.  Thoracic spine also unremarkable.      Assessment & Plan:   Rib contusion  Reassurance regarding x-rays.  He has been taking accommodation of Advil and clonazepam at night to help rest.  He may continue with this as needed.  I explained to him that he may be sore for another 2 to 3 weeks but his symptoms should gradually improve.  Since his pain is primarily at night, I do not think that he needs a rib belt.  Follow-up for ongoing or recalcitrant issues.  This note was dictated using Dragon naturally speaking software and may contain errors in syntax, spelling, or content which have not been identified prior to signing this note.

## 2018-10-04 ENCOUNTER — Emergency Department (HOSPITAL_COMMUNITY): Payer: 59

## 2018-10-04 ENCOUNTER — Inpatient Hospital Stay (HOSPITAL_COMMUNITY)
Admission: EM | Admit: 2018-10-04 | Discharge: 2018-10-07 | DRG: 378 | Disposition: A | Discharge status: Home / Self Care | Payer: 59 | Attending: Internal Medicine | Admitting: Internal Medicine

## 2018-10-04 ENCOUNTER — Other Ambulatory Visit: Payer: Self-pay

## 2018-10-04 DIAGNOSIS — K922 Gastrointestinal hemorrhage, unspecified: Secondary | ICD-10-CM | POA: Diagnosis present

## 2018-10-04 DIAGNOSIS — E876 Hypokalemia: Secondary | ICD-10-CM

## 2018-10-04 DIAGNOSIS — D6959 Other secondary thrombocytopenia: Secondary | ICD-10-CM | POA: Diagnosis present

## 2018-10-04 DIAGNOSIS — K209 Esophagitis, unspecified without bleeding: Secondary | ICD-10-CM

## 2018-10-04 DIAGNOSIS — F10239 Alcohol dependence with withdrawal, unspecified: Secondary | ICD-10-CM | POA: Diagnosis present

## 2018-10-04 DIAGNOSIS — D61818 Other pancytopenia: Secondary | ICD-10-CM

## 2018-10-04 DIAGNOSIS — D62 Acute posthemorrhagic anemia: Secondary | ICD-10-CM | POA: Diagnosis not present

## 2018-10-04 DIAGNOSIS — K7030 Alcoholic cirrhosis of liver without ascites: Secondary | ICD-10-CM

## 2018-10-04 DIAGNOSIS — D751 Secondary polycythemia: Secondary | ICD-10-CM | POA: Diagnosis present

## 2018-10-04 DIAGNOSIS — K3189 Other diseases of stomach and duodenum: Secondary | ICD-10-CM | POA: Diagnosis present

## 2018-10-04 DIAGNOSIS — K766 Portal hypertension: Secondary | ICD-10-CM | POA: Diagnosis not present

## 2018-10-04 DIAGNOSIS — R1110 Vomiting, unspecified: Secondary | ICD-10-CM | POA: Diagnosis not present

## 2018-10-04 DIAGNOSIS — R110 Nausea: Secondary | ICD-10-CM | POA: Diagnosis not present

## 2018-10-04 DIAGNOSIS — D696 Thrombocytopenia, unspecified: Secondary | ICD-10-CM

## 2018-10-04 DIAGNOSIS — F10930 Alcohol use, unspecified with withdrawal, uncomplicated: Secondary | ICD-10-CM

## 2018-10-04 DIAGNOSIS — K7031 Alcoholic cirrhosis of liver with ascites: Secondary | ICD-10-CM | POA: Diagnosis present

## 2018-10-04 DIAGNOSIS — Z79899 Other long term (current) drug therapy: Secondary | ICD-10-CM

## 2018-10-04 DIAGNOSIS — F10939 Alcohol use, unspecified with withdrawal, unspecified: Secondary | ICD-10-CM

## 2018-10-04 DIAGNOSIS — Z8249 Family history of ischemic heart disease and other diseases of the circulatory system: Secondary | ICD-10-CM

## 2018-10-04 DIAGNOSIS — E8809 Other disorders of plasma-protein metabolism, not elsewhere classified: Secondary | ICD-10-CM | POA: Diagnosis present

## 2018-10-04 DIAGNOSIS — F10230 Alcohol dependence with withdrawal, uncomplicated: Secondary | ICD-10-CM

## 2018-10-04 DIAGNOSIS — Z03818 Encounter for observation for suspected exposure to other biological agents ruled out: Secondary | ICD-10-CM | POA: Diagnosis not present

## 2018-10-04 DIAGNOSIS — R000 Tachycardia, unspecified: Secondary | ICD-10-CM | POA: Diagnosis not present

## 2018-10-04 DIAGNOSIS — K7460 Unspecified cirrhosis of liver: Secondary | ICD-10-CM

## 2018-10-04 DIAGNOSIS — R112 Nausea with vomiting, unspecified: Secondary | ICD-10-CM | POA: Diagnosis not present

## 2018-10-04 DIAGNOSIS — D7589 Other specified diseases of blood and blood-forming organs: Secondary | ICD-10-CM | POA: Diagnosis present

## 2018-10-04 DIAGNOSIS — Z6825 Body mass index (BMI) 25.0-25.9, adult: Secondary | ICD-10-CM

## 2018-10-04 DIAGNOSIS — E669 Obesity, unspecified: Secondary | ICD-10-CM | POA: Diagnosis present

## 2018-10-04 DIAGNOSIS — M1990 Unspecified osteoarthritis, unspecified site: Secondary | ICD-10-CM | POA: Diagnosis present

## 2018-10-04 DIAGNOSIS — Z8349 Family history of other endocrine, nutritional and metabolic diseases: Secondary | ICD-10-CM

## 2018-10-04 DIAGNOSIS — D1803 Hemangioma of intra-abdominal structures: Secondary | ICD-10-CM | POA: Diagnosis not present

## 2018-10-04 DIAGNOSIS — K920 Hematemesis: Principal | ICD-10-CM | POA: Diagnosis present

## 2018-10-04 DIAGNOSIS — Z20828 Contact with and (suspected) exposure to other viral communicable diseases: Secondary | ICD-10-CM | POA: Diagnosis present

## 2018-10-04 LAB — CBC
HCT: 34.9 % — ABNORMAL LOW (ref 39.0–52.0)
Hemoglobin: 12.3 g/dL — ABNORMAL LOW (ref 13.0–17.0)
MCH: 36.6 pg — ABNORMAL HIGH (ref 26.0–34.0)
MCHC: 35.2 g/dL (ref 30.0–36.0)
MCV: 103.9 fL — ABNORMAL HIGH (ref 80.0–100.0)
Platelets: 90 10*3/uL — ABNORMAL LOW (ref 150–400)
RBC: 3.36 MIL/uL — ABNORMAL LOW (ref 4.22–5.81)
RDW: 12.7 % (ref 11.5–15.5)
WBC: 14.6 10*3/uL — ABNORMAL HIGH (ref 4.0–10.5)
nRBC: 0 % (ref 0.0–0.2)

## 2018-10-04 LAB — URINALYSIS, ROUTINE W REFLEX MICROSCOPIC
Bacteria, UA: NONE SEEN
Bilirubin Urine: NEGATIVE
Glucose, UA: NEGATIVE mg/dL
Hgb urine dipstick: NEGATIVE
Ketones, ur: 5 mg/dL — AB
Leukocytes,Ua: NEGATIVE
Nitrite: NEGATIVE
Protein, ur: NEGATIVE mg/dL
Specific Gravity, Urine: 1.027 (ref 1.005–1.030)
pH: 7 (ref 5.0–8.0)

## 2018-10-04 LAB — COMPREHENSIVE METABOLIC PANEL
ALT: 40 U/L (ref 0–44)
AST: 82 U/L — ABNORMAL HIGH (ref 15–41)
Albumin: 3.3 g/dL — ABNORMAL LOW (ref 3.5–5.0)
Alkaline Phosphatase: 80 U/L (ref 38–126)
Anion gap: 14 (ref 5–15)
BUN: 13 mg/dL (ref 6–20)
CO2: 26 mmol/L (ref 22–32)
Calcium: 8.8 mg/dL — ABNORMAL LOW (ref 8.9–10.3)
Chloride: 93 mmol/L — ABNORMAL LOW (ref 98–111)
Creatinine, Ser: 0.57 mg/dL — ABNORMAL LOW (ref 0.61–1.24)
GFR calc Af Amer: >60 mL/min (ref >60)
GFR calc non Af Amer: >60 mL/min (ref >60)
Glucose, Bld: 110 mg/dL — ABNORMAL HIGH (ref 70–99)
Potassium: 2.7 mmol/L — CL (ref 3.5–5.1)
Sodium: 133 mmol/L — ABNORMAL LOW (ref 135–145)
Total Bilirubin: 2.8 mg/dL — ABNORMAL HIGH (ref 0.3–1.2)
Total Protein: 7.4 g/dL (ref 6.5–8.1)

## 2018-10-04 LAB — LIPASE, BLOOD: Lipase: 41 U/L (ref 11–51)

## 2018-10-04 IMAGING — CT CT ABD-PELV W/ CM
2 of 5 series · 15 of 46 positions shown, 17 images · IV contrast (omnipaque)
Comparison: None.

CLINICAL DATA: Hematemesis, right upper quadrant pain for 2 days,
nausea and vomiting onset today with hematemesis

EXAM:
CT ABDOMEN AND PELVIS WITH CONTRAST
TECHNIQUE: Multidetector CT imaging of the abdomen and pelvis was performed
using the standard protocol following bolus administration of
intravenous contrast.
CONTRAST:  100mL OMNIPAQUE IOHEXOL 300 MG/ML  SOLN

[Series 3: abdomen 5.0 · axial · 0.92mm/px · z∈[-553,-133]mm · 12 of 99 slices shown, 14 images]
[im 8/99  soft-tissue]
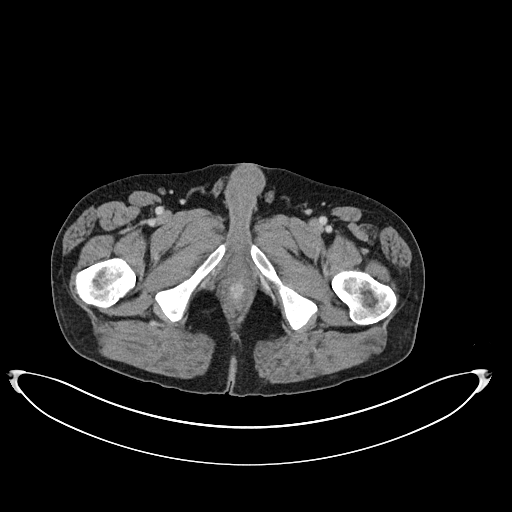
[im 8/99  bone]
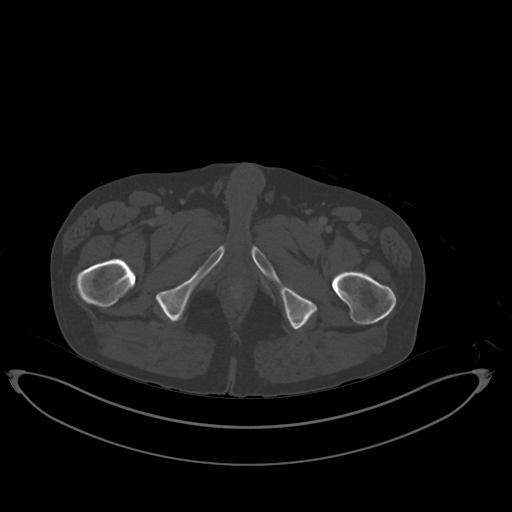
[im 15/99  soft-tissue]
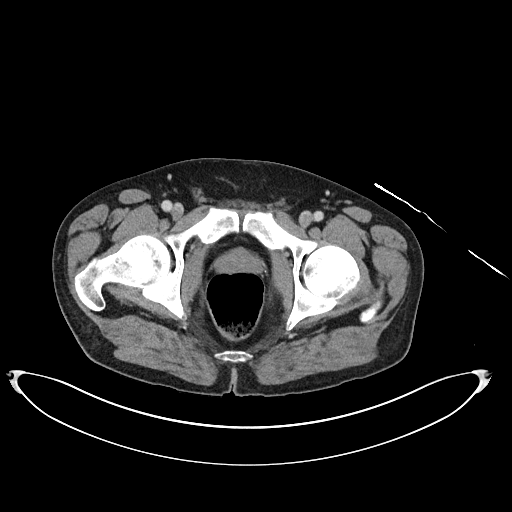
[im 22/99  soft-tissue]
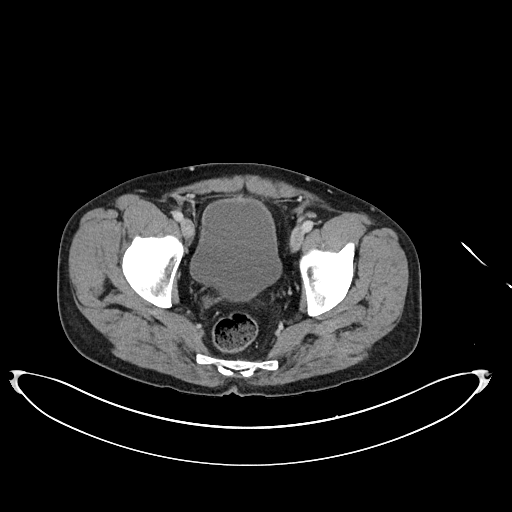
[im 29/99  soft-tissue]
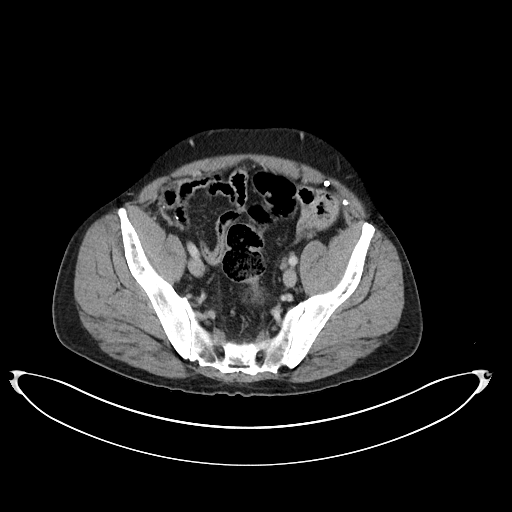
[im 36/99  soft-tissue]
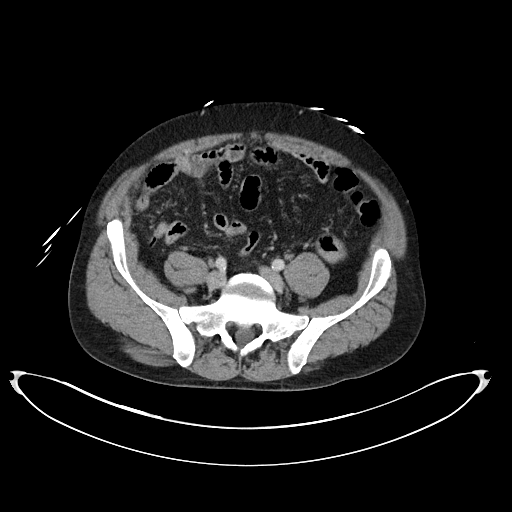
[im 43/99  soft-tissue]
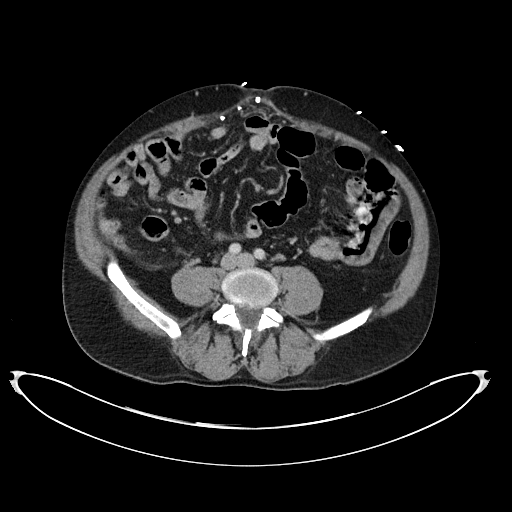
[im 57/99  soft-tissue]
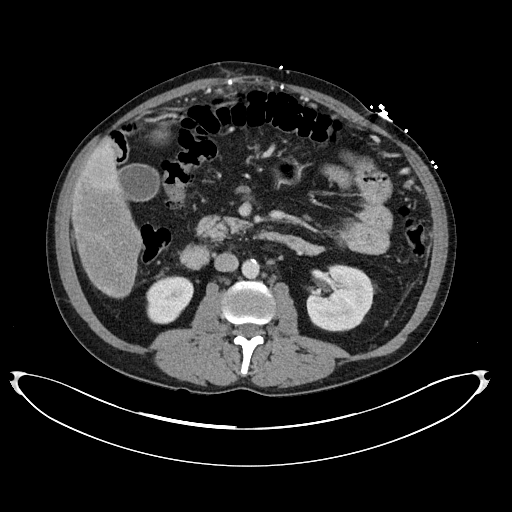
[im 64/99  soft-tissue]
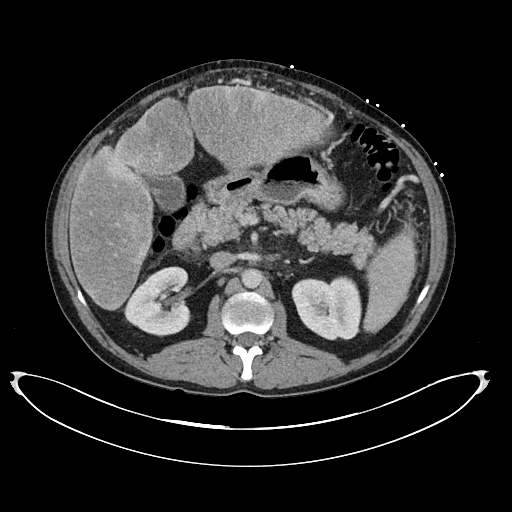
[im 71/99  soft-tissue]
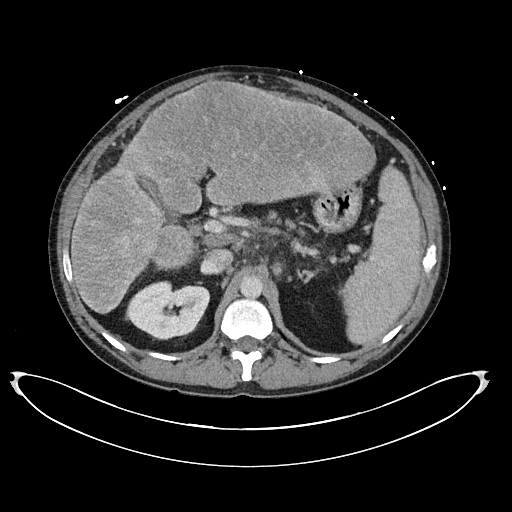
[im 71/99  bone]
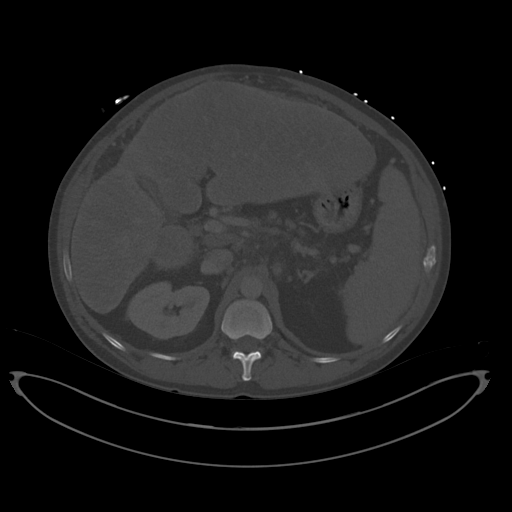
[im 78/99  soft-tissue]
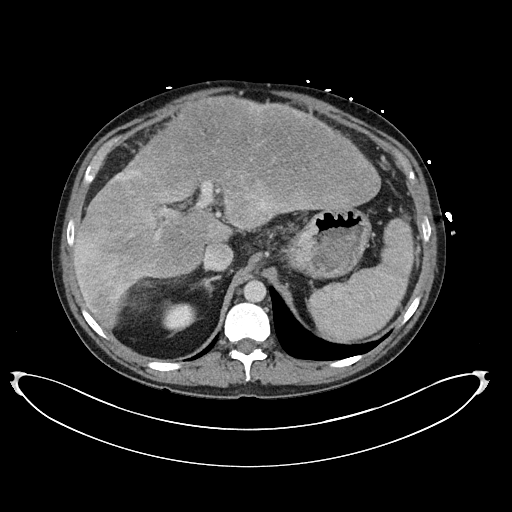
[im 85/99  soft-tissue]
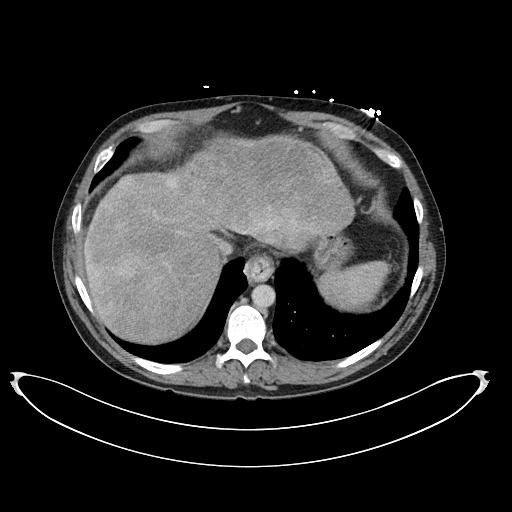
[im 92/99  soft-tissue]
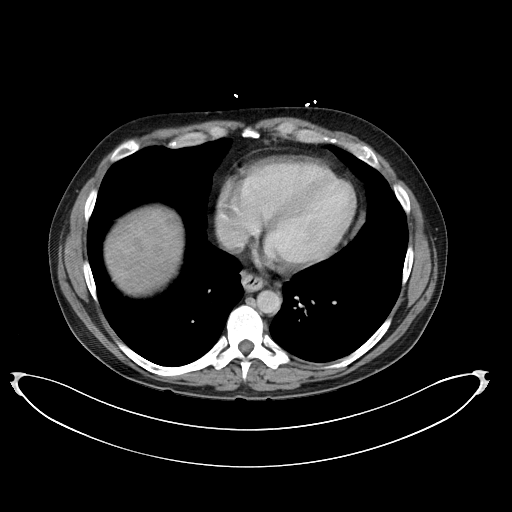

[Series 6: abdomen 3.0 mpr cor · coronal · 0.87mm/px · 3 of 106 slices shown]
[im 36/106  soft-tissue]
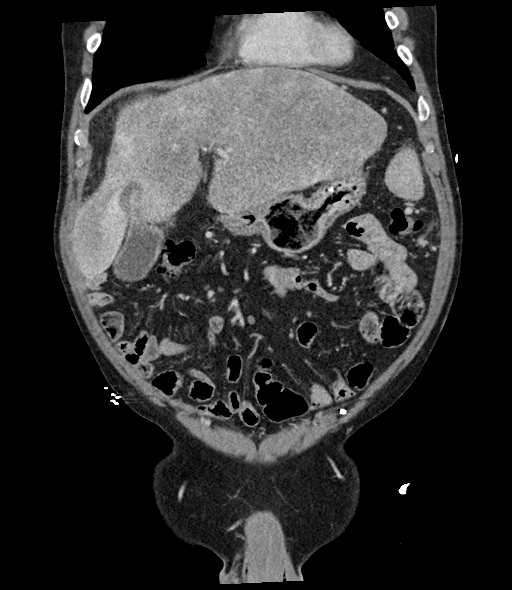
[im 47/106  soft-tissue]
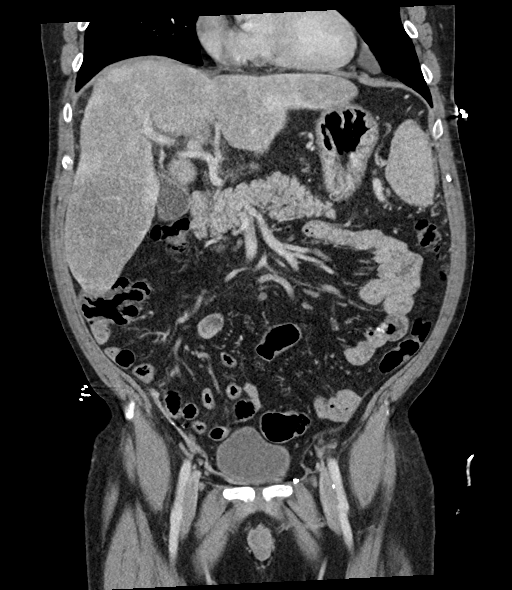
[im 59/106  soft-tissue]
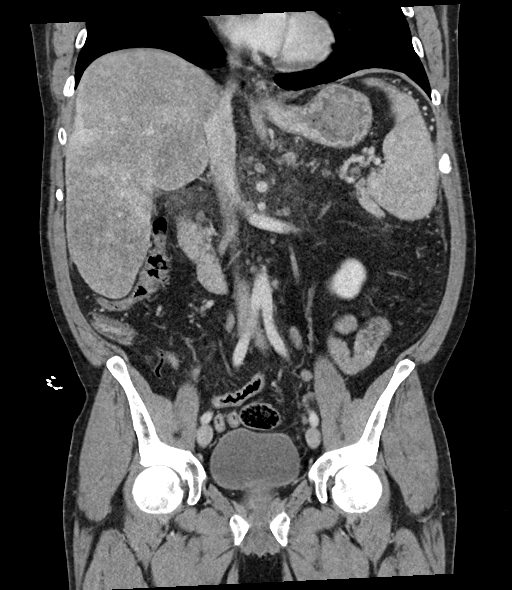

[15 of 46 positions shown; findings below may reference images not displayed]

FINDINGS: Lower chest: Lung bases are clear. Normal heart size. No pericardial
effusion.

Hepatobiliary: There is a lobular surface contour of the liver with
patchy heterogeneous and predominantly peripheral enhancement.
Heterogeneity persist on delayed imaging. There is a 1.8 cm
hypoattenuating structure seen adjacent the gallbladder fossa with
some nodular discontinuous peripheral enhancement evident on the
excretory phase delay imaging suggestive of hepatic hemangioma.
Surgical clip again along the falciform ligament. No gallbladder
wall thickening. No biliary ductal dilatation or calcified
gallstones.

Pancreas: Fatty replacement of the pancreas. No pancreatic ductal
dilatation or surrounding inflammatory changes.

Spleen: Borderline splenomegaly. Perisplenic venous
collateralization.

Adrenals/Urinary Tract: Normal adrenal glands. Areas of cortical
scarring. Kidneys are otherwise unremarkable, without renal calculi,
suspicious lesion, or hydronephrosis. Bladder is unremarkable.

Stomach/Bowel: There is mild thickening of the distal esophagus and
gastric cardia. Distal stomach and duodenal sweep are unremarkable.
No small bowel dilatation or wall thickening. A normal appendix is
visualized. Colon is largely decompressed at the time of exam. No
colonic dilatation or wall thickening.

Vascular/Lymphatic: Atherosclerotic plaque within the normal caliber
aorta. Upper abdominal venous collateralization is noted. There is
nonspecific stranding near the level of the diaphragmatic hiatus and
about the gastric cardia with reactive adenopathy. No pathologically
enlarged nodes.

Reproductive: The prostate and seminal vesicles are unremarkable.

Other: Small volume perihepatic ascites ([DATE]). Small amount of
tracking towards the right paracolic gutter and likely developing
left paracolic gutter as well. Additional hazy stranding near the
gastric cardia, as above. No free extraperitoneal air. Postsurgical
changes from prior ventral hernia repair

Musculoskeletal: No acute osseous abnormality or suspicious osseous
lesion.
IMPRESSION: 1. Stigmata of cirrhosis with evidence of portal hypertension
including splenomegaly and upper abdominal venous collateralization
including paraesophageal and gastric varices/venous collaterals and
trace ascites.
2. Thickening of the distal esophagus and gastric cardia with
adjacent stranding and reactive adenopathy, suggestive of
esophagitis. In the setting of hematemesis and cirrhotic features,
may be further evaluated with endoscopy to assess for variceal
bleeding.
3. Hypoattenuating lesion along the gallbladder fossa in the
anterior right lobe liver, features suggest hepatic hemangioma
though incompletely evaluated on this exam. Recommend dedicated
hepatic MRI in the nonacute setting given underlying cirrhosis.
4. Aortic atherosclerosis ([1I]-[1I]).
5. Prior ventral hernia repair.

## 2018-10-04 MED ORDER — SODIUM CHLORIDE 0.9 % IV SOLN
1.0000 g | Freq: Once | INTRAVENOUS | Status: AC
  Administered 2018-10-05: 01:00:00 1 g via INTRAVENOUS
  Filled 2018-10-04: qty 10

## 2018-10-04 MED ORDER — SODIUM CHLORIDE 0.9% FLUSH: 3.0000 mL | Freq: Once | INTRAVENOUS | Status: DC

## 2018-10-04 MED ORDER — POTASSIUM CHLORIDE 10 MEQ/100ML IV SOLN
10.0000 meq | INTRAVENOUS | Status: AC
  Administered 2018-10-05 (×2): 10 meq via INTRAVENOUS
  Filled 2018-10-04: qty 100

## 2018-10-04 MED ORDER — THIAMINE HCL 100 MG/ML IJ SOLN
100.0000 mg | Freq: Every day | INTRAMUSCULAR | Status: DC
  Filled 2018-10-04: qty 2

## 2018-10-04 MED ORDER — ONDANSETRON HCL 4 MG/2ML IJ SOLN
4.0000 mg | Freq: Once | INTRAMUSCULAR | Status: AC
  Administered 2018-10-05: 02:00:00 4 mg via INTRAVENOUS
  Filled 2018-10-04: qty 2

## 2018-10-04 MED ORDER — OCTREOTIDE LOAD VIA INFUSION
50.0000 ug | Freq: Once | INTRAVENOUS | Status: AC
  Administered 2018-10-05: 01:00:00 50 ug via INTRAVENOUS
  Filled 2018-10-04: qty 25

## 2018-10-04 MED ORDER — SODIUM CHLORIDE 0.9 % IV SOLN
80.0000 mg | Freq: Once | INTRAVENOUS | Status: AC
  Administered 2018-10-05: 02:00:00 80 mg via INTRAVENOUS
  Filled 2018-10-04: qty 80

## 2018-10-04 MED ORDER — LORAZEPAM 1 MG PO TABS: 0.0000 mg | ORAL_TABLET | Freq: Two times a day (BID) | ORAL | Status: DC

## 2018-10-04 MED ORDER — SODIUM CHLORIDE 0.9 % IV SOLN
50.0000 ug/h | INTRAVENOUS | Status: DC
  Administered 2018-10-05 – 2018-10-06 (×4): 50 ug/h via INTRAVENOUS
  Filled 2018-10-04 (×7): qty 1

## 2018-10-04 MED ORDER — IOHEXOL 300 MG/ML  SOLN
100.0000 mL | Freq: Once | INTRAMUSCULAR | Status: AC | PRN
  Administered 2018-10-04: 22:00:00 100 mL via INTRAVENOUS

## 2018-10-04 MED ORDER — LORAZEPAM 2 MG/ML IJ SOLN: 0.0000 mg | Freq: Two times a day (BID) | INTRAMUSCULAR | Status: DC

## 2018-10-04 MED ORDER — VITAMIN B-1 100 MG PO TABS: 100.0000 mg | ORAL_TABLET | Freq: Every day | ORAL | Status: DC

## 2018-10-04 MED ORDER — SODIUM CHLORIDE 0.9 % IV SOLN
8.0000 mg/h | INTRAVENOUS | Status: DC
  Administered 2018-10-05 – 2018-10-06 (×3): 8 mg/h via INTRAVENOUS
  Filled 2018-10-04 (×6): qty 80

## 2018-10-04 MED ORDER — LORAZEPAM 1 MG PO TABS: 0.0000 mg | ORAL_TABLET | Freq: Four times a day (QID) | ORAL | Status: DC

## 2018-10-04 MED ORDER — LORAZEPAM 2 MG/ML IJ SOLN
1.0000 mg | Freq: Once | INTRAMUSCULAR | Status: AC
  Administered 2018-10-05: 1 mg via INTRAVENOUS
  Filled 2018-10-04: qty 1

## 2018-10-04 MED ORDER — LORAZEPAM 2 MG/ML IJ SOLN
0.0000 mg | Freq: Four times a day (QID) | INTRAMUSCULAR | Status: DC
  Administered 2018-10-04: 21:00:00 2 mg via INTRAVENOUS
  Filled 2018-10-04: qty 1

## 2018-10-04 MED ORDER — MAGNESIUM SULFATE 2 GM/50ML IV SOLN
2.0000 g | INTRAVENOUS | Status: AC
  Administered 2018-10-05: 2 g via INTRAVENOUS
  Filled 2018-10-04: qty 50

## 2018-10-04 MED ORDER — SODIUM CHLORIDE 0.9 % IV BOLUS
1000.0000 mL | Freq: Once | INTRAVENOUS | Status: AC
  Administered 2018-10-04: 1000 mL via INTRAVENOUS

## 2018-10-04 NOTE — ED Triage Notes (Signed)
Patient reports RUQ pain x 2 days and N/V onset today with hematemesis. States he is trying to stop drinking, last drink Monday (was drinking 4 beers a day). NAD noted at this time.

## 2018-10-04 NOTE — ED Notes (Signed)
Hypokalemia reported by lab tech , will move to room as soon as possible.

## 2018-10-04 NOTE — ED Provider Notes (Signed)
[facility]ER[facility] DEPARTMENT Provider Note   CSN: [id number] Arrival date & time: 10/04/18  1627     History   Chief Complaint Chief Complaint  Patient presents with   Abdominal Pain   Hematemesis    HPI Joshua Hubbard is a 43 y.o. male with a past medical history of chronic alcohol abuse who presents the emergency department for alcohol withdrawal symptoms and hematemesis.  Patient states that he has been drinking at least 6 beers or 4 glasses of wine daily.  He gets the shakes when he does not stop drinking.  He is never tried to stop before however 2 days ago he became nauseated and began vomiting what he describes as a significant amount of bright red and black, coffee-ground emesis and clots.  Patient states that this "scared me very badly."  Since that time he has not had any alcohol.  He is also not been able to sleep.  He has been extremely anxious, agitated, tremulous, his heart has been racing.  He denies hallucinations or seizure.  He has epigastric abdominal pain that radiates to the right upper quadrant.  He denies any previous history of abdominal surgeries.  The pain does not radiate.  It is constant.  Nothing seems to make it worse or better.     HPI  Past Medical History:  Diagnosis Date   Arthritis    PONV (postoperative nausea and vomiting)    Tears of meniscus and ACL of left knee 03/19/2018    Patient Active Problem List   Diagnosis Date Noted   GI bleed 10/05/2018   Liver cirrhosis ([place]) 10/05/2018   Alcohol withdrawal ([place]) 10/05/2018   Hypokalemia 10/05/2018   Thrombocytopenia ([place]) 10/05/2018   Cellulitis of left lower extremity 03/29/2018   Tears of meniscus and ACL of left knee 03/19/2018   Tinnitus of both ears 02/01/2017   Hoarseness of voice 02/01/2017   FOOT PAIN, LEFT 12/25/2007   CLOSED DISLOCATION OF TARSAL JOINT UNSPECIFIED 12/25/2007    Past Surgical History:  Procedure Laterality Date    ARTHROSCOPIC REPAIR ACL     x2   HERNIA REPAIR     x3   KNEE ARTHROSCOPY WITH ANTERIOR CRUCIATE LIGAMENT (ACL) REPAIR WITH HAMSTRING GRAFT Left 03/20/2018   Procedure: KNEE ARTHROSCOPY WITH REVESION ANTERIOR CRUCIATE LIGAMENT (ACL) REPAIR WITH  HAMSTRING ALLOGRAFT,;  Surgeon: Elsie Saas, MD;  Location: [facility];  Service: Orthopedics;  Laterality: Left;   KNEE ARTHROSCOPY WITH LATERAL MENISECTOMY Left 03/20/2018   Procedure: KNEE ARTHROSCOPY WITH LATERAL MENISECTOMY;  Surgeon: Elsie Saas, MD;  Location: [place];  Service: Orthopedics;  Laterality: Left;   KNEE ARTHROSCOPY WITH MEDIAL MENISECTOMY Left 03/20/2018   Procedure: KNEE ARTHROSCOPY WITH MEDIAL MENISECTOMY;  Surgeon: Elsie Saas, MD;  Location: [facility];  Service: Orthopedics;  Laterality: Left;        Home Medications    Prior to Admission medications   Medication Sig Start Date End Date Taking? Authorizing Provider  clonazePAM (KLONOPIN) 1 MG tablet TAKE 1 TABLET AT BEDTIME Patient taking differently: Take 1 mg by mouth at bedtime.  07/02/18  Yes [name], MD  Multiple Vitamin (MULTIVITAMIN WITH MINERALS) TABS tablet Take 1 tablet by mouth daily.   Yes [provider]    Family History Family History  Problem Relation Age of Onset   Osteoarthritis Mother    Coronary artery disease Father    Hyperlipidemia Father     Social  History Social History   Tobacco Use   Smoking status: Never Smoker   Smokeless tobacco: Never Used  Substance Use Topics   Alcohol use: Yes    Alcohol/week: 42.0 standard drinks    Types: 42 Cans of beer per week    Frequency: Never   Drug use: No     Allergies   Patient has no known allergies.   Review of Systems Review of Systems  Ten systems reviewed and are negative for acute change, except as noted in the HPI.   Physical Exam Updated Vital Signs BP 112/69 (BP Location: Right Arm)    Pulse 94     Temp 98.6 F (37 C) (Oral)    Resp 14    Ht 5\' 9"  (1.753 m)    Wt 79.4 kg    SpO2 97%    BMI 25.84 kg/m   Physical Exam Vitals signs and nursing note reviewed.  Constitutional:      General: He is not in acute distress.    Appearance: He is well-developed. He is not diaphoretic.  HENT:     Head: Normocephalic and atraumatic.  Eyes:     General: No scleral icterus.    Conjunctiva/sclera: Conjunctivae normal.  Neck:     Musculoskeletal: Normal range of motion and neck supple.  Cardiovascular:     Rate and Rhythm: Normal rate and regular rhythm.     Heart sounds: Normal heart sounds.  Pulmonary:     Effort: Pulmonary effort is normal. No respiratory distress.     Breath sounds: Normal breath sounds.  Abdominal:     General: There is distension.     Palpations: Abdomen is soft.     Tenderness: There is abdominal tenderness in the epigastric area.  Skin:    General: Skin is warm and dry.     Coloration: Skin is jaundiced.     Comments: Patient with mild jaundice, telangiectasia noted over the upper chest and abdomen.  Neurological:     Mental Status: He is alert.     Comments: Tremulous with mydriatic pupils  Psychiatric:        Mood and Affect: Mood is anxious.        Behavior: Behavior normal.      ED Treatments / Results  Labs (all labs ordered are listed, but only abnormal results are displayed) Labs Reviewed  COMPREHENSIVE METABOLIC PANEL - Abnormal; Notable for the following components:      Result Value   Sodium 133 (*)    Potassium 2.7 (*)    Chloride 93 (*)    Glucose, Bld 110 (*)    Creatinine, Ser 0.57 (*)    Calcium 8.8 (*)    Albumin 3.3 (*)    AST 82 (*)    Total Bilirubin 2.8 (*)    All other components within normal limits  CBC - Abnormal; Notable for the following components:   WBC 14.6 (*)    RBC 3.36 (*)    Hemoglobin 12.3 (*)    HCT 34.9 (*)    MCV 103.9 (*)    MCH 36.6 (*)    Platelets 90 (*)    All other components within normal limits    URINALYSIS, ROUTINE W REFLEX MICROSCOPIC - Abnormal; Notable for the following components:   Color, Urine AMBER (*)    Ketones, ur 5 (*)    All other components within normal limits  HEMOGLOBIN AND HEMATOCRIT, BLOOD - Abnormal; Notable for the following components:   Hemoglobin  9.8 (*)    HCT 28.0 (*)    All other components within normal limits  CBC - Abnormal; Notable for the following components:   RBC 2.61 (*)    Hemoglobin 9.6 (*)    HCT 27.6 (*)    MCV 105.7 (*)    MCH 36.8 (*)    Platelets 61 (*)    All other components within normal limits  MAGNESIUM - Abnormal; Notable for the following components:   Magnesium 1.5 (*)    All other components within normal limits  HEPATIC FUNCTION PANEL - Abnormal; Notable for the following components:   Total Protein 5.9 (*)    Albumin 2.7 (*)    AST 64 (*)    Total Bilirubin 3.4 (*)    Bilirubin, Direct 1.2 (*)    Indirect Bilirubin 2.2 (*)    All other components within normal limits  BASIC METABOLIC PANEL - Abnormal; Notable for the following components:   Potassium 3.2 (*)    Glucose, Bld 117 (*)    Calcium 7.5 (*)    All other components within normal limits  CBC - Abnormal; Notable for the following components:   RBC 2.58 (*)    Hemoglobin 9.5 (*)    HCT 27.7 (*)    MCV 107.4 (*)    MCH 36.8 (*)    Platelets 55 (*)    All other components within normal limits  CBC - Abnormal; Notable for the following components:   RBC 2.60 (*)    Hemoglobin 9.4 (*)    HCT 28.1 (*)    MCV 108.1 (*)    MCH 36.2 (*)    Platelets 52 (*)    All other components within normal limits  SARS CORONAVIRUS 2 (HOSPITAL ORDER, [facility])  MRSA PCR SCREENING  LIPASE, BLOOD  HIV ANTIBODY (ROUTINE TESTING W REFLEX)  PHOSPHORUS  PROTIME-INR  CBC WITH DIFFERENTIAL/PLATELET  BASIC METABOLIC PANEL  MAGNESIUM  TYPE AND SCREEN  ABO/RH  PREPARE RBC (CROSSMATCH)  SURGICAL PATHOLOGY    EKG EKG  Interpretation  Date/Time:  Thursday October 04 2018 21:27:15 EDT Ventricular Rate:  120 PR Interval:    QRS Duration: 94 QT Interval:  326 QTC Calculation: 461 R Axis:   -57 Text Interpretation:  Sinus tachycardia Left axis deviation No previous ECGs available Confirmed by Fredia Sorrow [phone]) on 10/04/2018 9:45:36 PM   Radiology Ct Abdomen Pelvis W Contrast  Result Date: 10/04/2018 CLINICAL DATA:  Hematemesis, right upper quadrant pain for 2 days, nausea and vomiting onset today with hematemesis EXAM: CT ABDOMEN AND PELVIS WITH CONTRAST TECHNIQUE: Multidetector CT imaging of the abdomen and pelvis was performed using the standard protocol following bolus administration of intravenous contrast. CONTRAST:  14mL OMNIPAQUE IOHEXOL 300 MG/ML  SOLN COMPARISON:  None. FINDINGS: Lower chest: Lung bases are clear. Normal heart size. No pericardial effusion. Hepatobiliary: There is a lobular surface contour of the liver with patchy heterogeneous and predominantly peripheral enhancement. Heterogeneity persist on delayed imaging. There is a 1.8 cm hypoattenuating structure seen adjacent the gallbladder fossa with some nodular discontinuous peripheral enhancement evident on the excretory phase delay imaging suggestive of hepatic hemangioma. Surgical clip again along the falciform ligament. No gallbladder wall thickening. No biliary ductal dilatation or calcified gallstones. Pancreas: Fatty replacement of the pancreas. No pancreatic ductal dilatation or surrounding inflammatory changes. Spleen: Borderline splenomegaly. Perisplenic venous collateralization. Adrenals/Urinary Tract: Normal adrenal glands. Areas of cortical scarring. Kidneys are otherwise unremarkable, without renal calculi, suspicious lesion,  or hydronephrosis. Bladder is unremarkable. Stomach/Bowel: There is mild thickening of the distal esophagus and gastric cardia. Distal stomach and duodenal sweep are unremarkable. No small bowel  dilatation or wall thickening. A normal appendix is visualized. Colon is largely decompressed at the time of exam. No colonic dilatation or wall thickening. Vascular/Lymphatic: Atherosclerotic plaque within the normal caliber aorta. Upper abdominal venous collateralization is noted. There is nonspecific stranding near the level of the diaphragmatic hiatus and about the gastric cardia with reactive adenopathy. No pathologically enlarged nodes. Reproductive: The prostate and seminal vesicles are unremarkable. Other: Small volume perihepatic ascites (3/17). Small amount of tracking towards the right paracolic gutter and likely developing left paracolic gutter as well. Additional hazy stranding near the gastric cardia, as above. No free extraperitoneal air. Postsurgical changes from prior ventral hernia repair Musculoskeletal: No acute osseous abnormality or suspicious osseous lesion. IMPRESSION: 1. Stigmata of cirrhosis with evidence of portal hypertension including splenomegaly and upper abdominal venous collateralization including paraesophageal and gastric varices/venous collaterals and trace ascites. 2. Thickening of the distal esophagus and gastric cardia with adjacent stranding and reactive adenopathy, suggestive of esophagitis. In the setting of hematemesis and cirrhotic features, may be further evaluated with endoscopy to assess for variceal bleeding. 3. Hypoattenuating lesion along the gallbladder fossa in the anterior right lobe liver, features suggest hepatic hemangioma though incompletely evaluated on this exam. Recommend dedicated hepatic MRI in the nonacute setting given underlying cirrhosis. 4. Aortic atherosclerosis (ICD10-I70.0). 5. Prior ventral hernia repair. Electronically Signed   By: Lovena Le M.D.   On: 10/04/2018 22:32    Procedures Procedures (including critical care time)  Medications Ordered in ED Medications  sodium chloride flush (NS) 0.9 % injection 3 mL ( Intravenous MAR Unhold  10/05/18 1032)  octreotide (SANDOSTATIN) 2 mcg/mL load via infusion 50 mcg (50 mcg Intravenous Bolus from Bag 10/05/18 0117)    And  octreotide (SANDOSTATIN) 500 mcg in sodium chloride 0.9 % 250 mL (2 mcg/mL) infusion (50 mcg/hr Intravenous Rate/Dose Verify 10/05/18 1405)  pantoprazole (PROTONIX) 80 mg in sodium chloride 0.9 % 250 mL (0.32 mg/mL) infusion (8 mg/hr Intravenous Rate/Dose Verify 10/05/18 1408)  cefTRIAXone (ROCEPHIN) 2 g in sodium chloride 0.9 % 100 mL IVPB (2 g Intravenous New Bag/Given 10/05/18 2146)  LORazepam (ATIVAN) tablet 1-4 mg (2 mg Oral Given 10/05/18 2131)    Or  LORazepam (ATIVAN) injection 1-4 mg ( Intravenous See Alternative 10/05/18 2131)  thiamine (VITAMIN B-1) tablet 100 mg ( Oral MAR Unhold 10/05/18 1032)    Or  thiamine (B-1) injection 100 mg ( Intravenous MAR Unhold [id number])  folic acid (FOLVITE) tablet 1 mg ( Oral MAR Unhold 10/05/18 1032)  multivitamin with minerals tablet 1 tablet ( Oral MAR Unhold 10/05/18 1032)  potassium chloride 10 mEq in 100 mL IVPB (10 mEq Intravenous Not Given 10/05/18 1403)  0.9 %  sodium chloride infusion ( Intravenous Stopped 10/05/18 1411)  ondansetron (ZOFRAN) injection 4 mg ( Intravenous MAR Unhold 10/05/18 1032)  0.9 %  sodium chloride infusion (Manually program via Guardrails IV Fluids) ( Intravenous Hold 10/05/18 1410)  sodium chloride 0.9 % bolus 1,000 mL (0 mLs Intravenous Stopped 10/05/18 1059)  potassium chloride 10 mEq in 100 mL IVPB (0 mEq Intravenous Stopped 10/05/18 0247)  magnesium sulfate IVPB 2 g 50 mL (0 g Intravenous Stopped 10/05/18 0116)  LORazepam (ATIVAN) injection 1 mg (1 mg Intravenous Given 10/05/18 0053)  iohexol (OMNIPAQUE) 300 MG/ML solution 100 mL (100 mLs Intravenous Contrast Given 10/04/18 2206)  pantoprazole (  PROTONIX) 80 mg in sodium chloride 0.9 % 100 mL IVPB (0 mg Intravenous Stopped 10/05/18 0247)  ondansetron (ZOFRAN) injection 4 mg (4 mg Intravenous Given 10/05/18 0132)  cefTRIAXone (ROCEPHIN) 1 g in  sodium chloride 0.9 % 100 mL IVPB (0 g Intravenous Stopped 10/05/18 0247)  potassium chloride 10 MEQ/100ML IVPB (0 mEq  Stopped 10/05/18 1402)  LORazepam (ATIVAN) injection 1 mg (1 mg Intravenous Given 10/05/18 0233)     Initial Impression / Assessment and Plan / ED Course  I have reviewed the triage vital signs and the nursing notes.  Pertinent labs & imaging results that were available during my care of the patient were reviewed by me and considered in my medical decision making (see chart for details).  Clinical Course as of Oct 05 2347  Fri Oct 05, 2018  0200 Informed by nursing that patient had large volume hematemesis.  Approximately 300 cc.  On my evaluation, no longer vomiting.  Tachycardic into the 120s.  Reports anxiety but no significant pain.  Patient was bolused a liter.  Given 1 additional dose of Ativan now.  Will consult GI.  Patient does not have a prior GI doctor.   [CH]  [phone] Spoke with Dr. [name], [facility].  We will plan for endoscopy first thing in the morning.  If patient becomes unstable or has another large volume of hematemesis, will call immediately.  Also discussed with hospitalist.  She is concerned he may need ICU level care given his acute withdrawal as well.  Will discuss with intensivist   [CH]    Clinical Course User Index [CH] Horton, Barbette Hair, MD       CC: Alcohol withdrawal and hematemesis with epigastric abdominal pain VS:  Vitals:   10/05/18 1020 10/05/18 1844 10/05/18 2109 10/05/18 2200  BP: 118/71 117/75 112/69   Pulse: (!) 106 100  94  Resp: (!) 21 18 18 14   Temp:   98.6 F (37 C)   TempSrc:   Oral   SpO2:  96% 98% 97%  Weight:      Height:        [id number] is gathered by patient  and EMR. [id number] diagnosis of epigastric pain includes: Functional or nonulcer dyspepsia , PUD, GERD, Gastritis, (NSAIDs, alcohol, stress, H. pylori, pernicious anemia), pancreatitis or pancreatic cancer, overeating indigestion (high-fat  foods, coffee), drugs (aspirin, antibiotics (eg, macrolides, metronidazole), corticosteroids, digoxin, narcotics, theophylline), gastroparesis, lactose intolerance, malabsorption gastric cancer, parasitic infection, (Giardia, Strongyloides, Ascaris) cholelithiasis, choledocholithiasis, or cholangitis, ACS, pericarditis, pneumonia, abdominal hernia, pregnancy, intestinal ischemia, esophageal rupture, gastric volvulus, hepatitis.  Labs: I reviewed the labs which show hypokalemia of 2.7.  Low sodium.  Low calcium.  Low albumin level at 3.3.  AST elevated in the 2-1 ratio of 2 ALT.  Bilirubin elevated to 2.8.  Patient CBC shows white blood cell count of 14.6.  Hemoglobin 12.3But that is down from a hemoglobin of 16.76 months ago.  Platelets at 90,000 labs are all reflective of chronic alcoholism.  Patient anemia also macrocytic.  Imaging: I personally reviewed the images (CT abdomen and pelvis with contrast) which show(s) Changes suggestive of cirrhosis with portal hypertension, associated splenomegaly, upper abdominal venous collateralization and paraesophageal and gastric varices with venous collaterals.  It also shows thickening of the distal esophagus and gastric cardia with stranding suggestive of esophagitis. EKG:  EKG Interpretation  Date/Time:  Thursday October 04 2018 21:27:15 EDT Ventricular Rate:  120 PR Interval:    QRS Duration: 94 QT Interval:  326  QTC Calculation: 461 R Axis:   -57 Text Interpretation:  Sinus tachycardia Left axis deviation No previous ECGs available Confirmed by Fredia Sorrow [phone]) on 10/04/2018 9:45:36 PM       MDM: Patient here with acute alcohol withdrawal.  Initial C waw score of 17, tachycardic, hypertensive.  Patient started on CIWAprotocol.  He is hypokalemic and I have begun IV potassium and magnesium repletion.  Also received thiamine.  Patient's CT scan shows likely esophagitis however in the setting of hematemesis with obvious para esophageal varices  there is concern for potential variceal bleed.  Patient also tachycardic which may be from alcohol withdrawal however he does have a significant drop of his hemoglobin from previous readings and may also be due to continued blood loss.  He has not had any active hematemesis here.  Patient has been started on octreotide and Protonix.  Placed on clear liquid diet.  IV Rocephin 250 mg.  Patient will need admission for management of his withdrawal syndrome and GI consult. Patient disposition: Admit Patient condition: Serious. The patient appears reasonably stabilized for admission considering the current resources, flow, and capabilities available in the ED at this time, and I doubt any other [facility] requiring further screening and/or treatment in the ED prior to admission.   Final Clinical Impressions(s) / ED Diagnoses   Final diagnoses:  Hematemesis with nausea  Esophagitis  Alcoholic cirrhosis, unspecified whether ascites present [facility])  Alcohol withdrawal syndrome without complication [facility])    ED Discharge Orders    None       Margarita Mail, PA-C 10/05/18 2350    Fredia Sorrow, MD 10/21/18 [phone]

## 2018-10-05 ENCOUNTER — Inpatient Hospital Stay (HOSPITAL_COMMUNITY): Payer: 59 | Admitting: Anesthesiology

## 2018-10-05 ENCOUNTER — Encounter (HOSPITAL_COMMUNITY)
Admission: EM | Disposition: A | Discharge status: Home / Self Care | Payer: Self-pay | Source: Home / Self Care | Attending: Internal Medicine

## 2018-10-05 ENCOUNTER — Encounter (HOSPITAL_COMMUNITY): Payer: Self-pay | Admitting: *Deleted

## 2018-10-05 DIAGNOSIS — F10239 Alcohol dependence with withdrawal, unspecified: Secondary | ICD-10-CM

## 2018-10-05 DIAGNOSIS — E876 Hypokalemia: Secondary | ICD-10-CM

## 2018-10-05 DIAGNOSIS — E8809 Other disorders of plasma-protein metabolism, not elsewhere classified: Secondary | ICD-10-CM | POA: Diagnosis present

## 2018-10-05 DIAGNOSIS — D696 Thrombocytopenia, unspecified: Secondary | ICD-10-CM | POA: Diagnosis not present

## 2018-10-05 DIAGNOSIS — K208 Other esophagitis: Secondary | ICD-10-CM | POA: Diagnosis not present

## 2018-10-05 DIAGNOSIS — Z6825 Body mass index (BMI) 25.0-25.9, adult: Secondary | ICD-10-CM | POA: Diagnosis not present

## 2018-10-05 DIAGNOSIS — K7460 Unspecified cirrhosis of liver: Secondary | ICD-10-CM | POA: Diagnosis not present

## 2018-10-05 DIAGNOSIS — D62 Acute posthemorrhagic anemia: Secondary | ICD-10-CM | POA: Diagnosis not present

## 2018-10-05 DIAGNOSIS — D61818 Other pancytopenia: Secondary | ICD-10-CM | POA: Insufficient documentation

## 2018-10-05 DIAGNOSIS — K3189 Other diseases of stomach and duodenum: Secondary | ICD-10-CM | POA: Diagnosis not present

## 2018-10-05 DIAGNOSIS — Z8249 Family history of ischemic heart disease and other diseases of the circulatory system: Secondary | ICD-10-CM | POA: Diagnosis not present

## 2018-10-05 DIAGNOSIS — Z79899 Other long term (current) drug therapy: Secondary | ICD-10-CM | POA: Diagnosis not present

## 2018-10-05 DIAGNOSIS — D1803 Hemangioma of intra-abdominal structures: Secondary | ICD-10-CM | POA: Diagnosis present

## 2018-10-05 DIAGNOSIS — E669 Obesity, unspecified: Secondary | ICD-10-CM | POA: Diagnosis present

## 2018-10-05 DIAGNOSIS — K922 Gastrointestinal hemorrhage, unspecified: Secondary | ICD-10-CM | POA: Diagnosis present

## 2018-10-05 DIAGNOSIS — K2951 Unspecified chronic gastritis with bleeding: Secondary | ICD-10-CM | POA: Diagnosis not present

## 2018-10-05 DIAGNOSIS — D751 Secondary polycythemia: Secondary | ICD-10-CM | POA: Diagnosis present

## 2018-10-05 DIAGNOSIS — Z20828 Contact with and (suspected) exposure to other viral communicable diseases: Secondary | ICD-10-CM | POA: Diagnosis present

## 2018-10-05 DIAGNOSIS — K920 Hematemesis: Secondary | ICD-10-CM | POA: Diagnosis not present

## 2018-10-05 DIAGNOSIS — D7589 Other specified diseases of blood and blood-forming organs: Secondary | ICD-10-CM | POA: Diagnosis present

## 2018-10-05 DIAGNOSIS — M1990 Unspecified osteoarthritis, unspecified site: Secondary | ICD-10-CM | POA: Diagnosis present

## 2018-10-05 DIAGNOSIS — D6959 Other secondary thrombocytopenia: Secondary | ICD-10-CM | POA: Diagnosis present

## 2018-10-05 DIAGNOSIS — K7031 Alcoholic cirrhosis of liver with ascites: Secondary | ICD-10-CM | POA: Diagnosis present

## 2018-10-05 DIAGNOSIS — K209 Esophagitis, unspecified: Secondary | ICD-10-CM | POA: Diagnosis not present

## 2018-10-05 DIAGNOSIS — K766 Portal hypertension: Secondary | ICD-10-CM | POA: Diagnosis not present

## 2018-10-05 DIAGNOSIS — F10939 Alcohol use, unspecified with withdrawal, unspecified: Secondary | ICD-10-CM

## 2018-10-05 DIAGNOSIS — Z8349 Family history of other endocrine, nutritional and metabolic diseases: Secondary | ICD-10-CM | POA: Diagnosis not present

## 2018-10-05 HISTORY — PX: BIOPSY: SHX5522

## 2018-10-05 HISTORY — PX: ESOPHAGOGASTRODUODENOSCOPY (EGD) WITH PROPOFOL: SHX5813

## 2018-10-05 LAB — HEPATIC FUNCTION PANEL
ALT: 35 U/L (ref 0–44)
AST: 64 U/L — ABNORMAL HIGH (ref 15–41)
Albumin: 2.7 g/dL — ABNORMAL LOW (ref 3.5–5.0)
Alkaline Phosphatase: 59 U/L (ref 38–126)
Bilirubin, Direct: 1.2 mg/dL — ABNORMAL HIGH (ref 0.0–0.2)
Indirect Bilirubin: 2.2 mg/dL — ABNORMAL HIGH (ref 0.3–0.9)
Total Bilirubin: 3.4 mg/dL — ABNORMAL HIGH (ref 0.3–1.2)
Total Protein: 5.9 g/dL — ABNORMAL LOW (ref 6.5–8.1)

## 2018-10-05 LAB — CBC
HCT: 27.6 % — ABNORMAL LOW (ref 39.0–52.0)
HCT: 27.7 % — ABNORMAL LOW (ref 39.0–52.0)
HCT: 28.1 % — ABNORMAL LOW (ref 39.0–52.0)
Hemoglobin: 9.6 g/dL — ABNORMAL LOW (ref 13.0–17.0)
Hemoglobin: 9.5 g/dL — ABNORMAL LOW (ref 13.0–17.0)
Hemoglobin: 9.4 g/dL — ABNORMAL LOW (ref 13.0–17.0)
MCH: 36.8 pg — ABNORMAL HIGH (ref 26.0–34.0)
MCH: 36.8 pg — ABNORMAL HIGH (ref 26.0–34.0)
MCH: 36.2 pg — ABNORMAL HIGH (ref 26.0–34.0)
MCHC: 34.8 g/dL (ref 30.0–36.0)
MCHC: 34.3 g/dL (ref 30.0–36.0)
MCHC: 33.5 g/dL (ref 30.0–36.0)
MCV: 105.7 fL — ABNORMAL HIGH (ref 80.0–100.0)
MCV: 107.4 fL — ABNORMAL HIGH (ref 80.0–100.0)
MCV: 108.1 fL — ABNORMAL HIGH (ref 80.0–100.0)
Platelets: 61 10*3/uL — ABNORMAL LOW (ref 150–400)
Platelets: 55 10*3/uL — ABNORMAL LOW (ref 150–400)
Platelets: 52 10*3/uL — ABNORMAL LOW (ref 150–400)
RBC: 2.61 MIL/uL — ABNORMAL LOW (ref 4.22–5.81)
RBC: 2.58 MIL/uL — ABNORMAL LOW (ref 4.22–5.81)
RBC: 2.6 MIL/uL — ABNORMAL LOW (ref 4.22–5.81)
RDW: 12.9 % (ref 11.5–15.5)
RDW: 13 % (ref 11.5–15.5)
RDW: 12.9 % (ref 11.5–15.5)
WBC: 6.9 10*3/uL (ref 4.0–10.5)
WBC: 5.5 10*3/uL (ref 4.0–10.5)
WBC: 4.5 10*3/uL (ref 4.0–10.5)
nRBC: 0 % (ref 0.0–0.2)
nRBC: 0 % (ref 0.0–0.2)
nRBC: 0 % (ref 0.0–0.2)

## 2018-10-05 LAB — BASIC METABOLIC PANEL
Anion gap: 10 (ref 5–15)
BUN: 17 mg/dL (ref 6–20)
CO2: 23 mmol/L (ref 22–32)
Calcium: 7.5 mg/dL — ABNORMAL LOW (ref 8.9–10.3)
Chloride: 102 mmol/L (ref 98–111)
Creatinine, Ser: 0.88 mg/dL (ref 0.61–1.24)
GFR calc Af Amer: >60 mL/min (ref >60)
GFR calc non Af Amer: >60 mL/min (ref >60)
Glucose, Bld: 117 mg/dL — ABNORMAL HIGH (ref 70–99)
Potassium: 3.2 mmol/L — ABNORMAL LOW (ref 3.5–5.1)
Sodium: 135 mmol/L (ref 135–145)

## 2018-10-05 LAB — PHOSPHORUS: Phosphorus: 3.2 mg/dL (ref 2.5–4.6)

## 2018-10-05 LAB — HEMOGLOBIN AND HEMATOCRIT, BLOOD
HCT: 28 % — ABNORMAL LOW (ref 39.0–52.0)
Hemoglobin: 9.8 g/dL — ABNORMAL LOW (ref 13.0–17.0)

## 2018-10-05 LAB — MRSA PCR SCREENING: MRSA by PCR: NEGATIVE

## 2018-10-05 LAB — ABO/RH: ABO/RH(D): AB POS

## 2018-10-05 LAB — MAGNESIUM: Magnesium: 1.5 mg/dL — ABNORMAL LOW (ref 1.7–2.4)

## 2018-10-05 LAB — HIV ANTIBODY (ROUTINE TESTING W REFLEX): HIV Screen 4th Generation wRfx: NONREACTIVE

## 2018-10-05 LAB — PREPARE RBC (CROSSMATCH)

## 2018-10-05 LAB — SARS CORONAVIRUS 2 BY RT PCR (HOSPITAL ORDER, PERFORMED IN ~~LOC~~ HOSPITAL LAB): SARS Coronavirus 2: NEGATIVE

## 2018-10-05 SURGERY — ESOPHAGOGASTRODUODENOSCOPY (EGD) WITH PROPOFOL
Anesthesia: General

## 2018-10-05 MED ORDER — POTASSIUM CHLORIDE 10 MEQ/100ML IV SOLN
10.0000 meq | INTRAVENOUS | Status: AC
  Administered 2018-10-05 (×3): 10 meq via INTRAVENOUS
  Filled 2018-10-05 (×4): qty 100

## 2018-10-05 MED ORDER — LORAZEPAM 2 MG/ML IJ SOLN
1.0000 mg | Freq: Once | INTRAMUSCULAR | Status: AC
  Administered 2018-10-05: 1 mg via INTRAVENOUS
  Filled 2018-10-05: qty 1

## 2018-10-05 MED ORDER — LORAZEPAM 1 MG PO TABS
1.0000 mg | ORAL_TABLET | ORAL | Status: DC | PRN
  Administered 2018-10-05 (×2): 1 mg via ORAL
  Administered 2018-10-05 (×2): 2 mg via ORAL
  Administered 2018-10-07: 06:00:00 4 mg via ORAL
  Filled 2018-10-05: qty 1
  Filled 2018-10-05 (×3): qty 2
  Filled 2018-10-05: qty 1

## 2018-10-05 MED ORDER — SODIUM CHLORIDE 0.9 % IV SOLN
INTRAVENOUS | Status: AC
  Administered 2018-10-05: 04:00:00 via INTRAVENOUS

## 2018-10-05 MED ORDER — ONDANSETRON HCL 4 MG/2ML IJ SOLN: 4.0000 mg | Freq: Four times a day (QID) | INTRAMUSCULAR | Status: DC | PRN

## 2018-10-05 MED ORDER — SUCCINYLCHOLINE CHLORIDE 200 MG/10ML IV SOSY
PREFILLED_SYRINGE | INTRAVENOUS | Status: DC | PRN
  Administered 2018-10-05: 140 mg via INTRAVENOUS

## 2018-10-05 MED ORDER — POTASSIUM CHLORIDE 10 MEQ/100ML IV SOLN
INTRAVENOUS | Status: AC
  Administered 2018-10-05: 08:00:00 10 meq via INTRAVENOUS
  Filled 2018-10-05: qty 100

## 2018-10-05 MED ORDER — LORAZEPAM 2 MG/ML IJ SOLN
1.0000 mg | INTRAMUSCULAR | Status: DC | PRN
  Administered 2018-10-05: 07:00:00 3 mg via INTRAVENOUS
  Administered 2018-10-06 (×2): 4 mg via INTRAVENOUS
  Administered 2018-10-06: 23:00:00 3 mg via INTRAVENOUS
  Administered 2018-10-06 (×3): 2 mg via INTRAVENOUS
  Administered 2018-10-06: 09:00:00 4 mg via INTRAVENOUS
  Administered 2018-10-06: 04:00:00 2 mg via INTRAVENOUS
  Administered 2018-10-07: 3 mg via INTRAVENOUS
  Filled 2018-10-05: qty 1
  Filled 2018-10-05 (×2): qty 2
  Filled 2018-10-05: qty 1
  Filled 2018-10-05: qty 2
  Filled 2018-10-05 (×2): qty 1
  Filled 2018-10-05 (×2): qty 2
  Filled 2018-10-05: qty 1
  Filled 2018-10-05: qty 2

## 2018-10-05 MED ORDER — FENTANYL CITRATE (PF) 100 MCG/2ML IJ SOLN
INTRAMUSCULAR | Status: DC | PRN
  Administered 2018-10-05: 100 ug via INTRAVENOUS

## 2018-10-05 MED ORDER — SODIUM CHLORIDE 0.9% IV SOLUTION: Freq: Once | INTRAVENOUS | Status: DC

## 2018-10-05 MED ORDER — LIDOCAINE 2% (20 MG/ML) 5 ML SYRINGE
INTRAMUSCULAR | Status: DC | PRN
  Administered 2018-10-05: 80 mg via INTRAVENOUS

## 2018-10-05 MED ORDER — THIAMINE HCL 100 MG/ML IJ SOLN
100.0000 mg | Freq: Every day | INTRAMUSCULAR | Status: DC
  Administered 2018-10-06: 09:00:00 100 mg via INTRAVENOUS
  Filled 2018-10-05: qty 2

## 2018-10-05 MED ORDER — ADULT MULTIVITAMIN W/MINERALS CH
1.0000 | ORAL_TABLET | Freq: Every day | ORAL | Status: DC
  Administered 2018-10-06: 09:00:00 1 via ORAL
  Filled 2018-10-05: qty 1

## 2018-10-05 MED ORDER — ONDANSETRON HCL 4 MG/2ML IJ SOLN
INTRAMUSCULAR | Status: DC | PRN
  Administered 2018-10-05: 4 mg via INTRAVENOUS

## 2018-10-05 MED ORDER — MIDAZOLAM HCL 5 MG/5ML IJ SOLN
INTRAMUSCULAR | Status: DC | PRN
  Administered 2018-10-05: 2 mg via INTRAVENOUS

## 2018-10-05 MED ORDER — DEXAMETHASONE SODIUM PHOSPHATE 10 MG/ML IJ SOLN
INTRAMUSCULAR | Status: DC | PRN
  Administered 2018-10-05: 4 mg via INTRAVENOUS

## 2018-10-05 MED ORDER — FOLIC ACID 1 MG PO TABS
1.0000 mg | ORAL_TABLET | Freq: Every day | ORAL | Status: DC
  Administered 2018-10-06: 09:00:00 1 mg via ORAL
  Filled 2018-10-05: qty 1

## 2018-10-05 MED ORDER — PROPOFOL 10 MG/ML IV BOLUS
INTRAVENOUS | Status: DC | PRN
  Administered 2018-10-05: 30 mg via INTRAVENOUS
  Administered 2018-10-05: 150 mg via INTRAVENOUS

## 2018-10-05 MED ORDER — LACTATED RINGERS IV SOLN
INTRAVENOUS | Status: DC | PRN
  Administered 2018-10-05: 09:00:00 via INTRAVENOUS

## 2018-10-05 MED ORDER — VITAMIN B-1 100 MG PO TABS
100.0000 mg | ORAL_TABLET | Freq: Every day | ORAL | Status: DC
  Filled 2018-10-05: qty 1

## 2018-10-05 MED ORDER — SODIUM CHLORIDE 0.9 % IV SOLN
2.0000 g | INTRAVENOUS | Status: DC
  Administered 2018-10-05 – 2018-10-06 (×2): 2 g via INTRAVENOUS
  Filled 2018-10-05 (×2): qty 20

## 2018-10-05 SURGICAL SUPPLY — 14 items

## 2018-10-05 NOTE — Anesthesia Postprocedure Evaluation (Signed)
Anesthesia Post Note  Patient: Joshua Hubbard  Procedure(s) Performed: ESOPHAGOGASTRODUODENOSCOPY (EGD) WITH PROPOFOL (N/A ) BIOPSY     Anesthesia Post Evaluation  Last Vitals:  Vitals:   10/05/18 1014 10/05/18 1020  BP: 108/71 118/71  Pulse: (!) 103 (!) 106  Resp: (!) 24 (!) 21  Temp:    SpO2:      Last Pain:  Vitals:   10/05/18 1020  TempSrc:   PainSc: 0-No pain                 [name]

## 2018-10-05 NOTE — Anesthesia Procedure Notes (Signed)
Procedure Name: Intubation Date/Time: 10/05/2018 9:19 AM Performed by: Moshe Salisbury, CRNA Pre-anesthesia Checklist: Patient identified, Emergency Drugs available, Suction available and Patient being monitored Patient Re-evaluated:Patient Re-evaluated prior to induction Oxygen Delivery Method: Circle System Utilized Preoxygenation: Pre-oxygenation with 100% oxygen Induction Type: IV induction Ventilation: Mask ventilation without difficulty Laryngoscope Size: Mac and 4 Grade View: Grade I Tube type: Oral Tube size: 8.0 mm Number of attempts: 1 Airway Equipment and Method: Stylet and Oral airway Placement Confirmation: ETT inserted through vocal cords under direct vision,  positive ETCO2 and breath sounds checked- equal and bilateral Secured at: 23 cm Tube secured with: Tape Dental Injury: Teeth and Oropharynx as per pre-operative assessment

## 2018-10-05 NOTE — ED Notes (Signed)
Paged triad to [facility]

## 2018-10-05 NOTE — ED Notes (Signed)
Pt is sinus tach on monitor 

## 2018-10-05 NOTE — ED Notes (Signed)
Dahal MD came to see this pt & informed this RN to not give the ordered blood as of now. Due to no active bleeding & hemoglobin is currently 9.8.

## 2018-10-05 NOTE — ED Notes (Signed)
Paged FM to rn Kim[facility]

## 2018-10-05 NOTE — Consult Note (Addendum)
Referring Provider:  Dr. Marni Griffon Primary Care Physician:  [name], MD Primary Gastroenterologist: None (unassigned)  Reason for Consultation: Hematemesis  HPI: Joshua Hubbard is a 43 y.o. 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  He indicates that a couple of years ago, when he had hernia surgery and [place], he was told he had "some changes of cirrhosis."  Approximately 36 hours ago, he had hematemesis and stopped drinking thereafter, but continued to have hematemesis to the point of near syncope.  No significant abdominal pain.  He presented to the emergency room where he had hematemesis of about 300 mL's of blood, but since then, roughly the past 6 hours, he has not had further hematemesis.  He is on an octreotide infusion, antibiotics, and IV Protonix.  CT of the abdomen obtained on admission showed cirrhotic changes including a nodular liver, gastric and esophageal varices, and splenomegaly with some possible early ascites.  He also has biochemical evidence of chronic liver disease with thrombocytopenia (current platelets 61,000), macrocytosis, hypoalbuminemia (2.7), OT/ PT split (AST 64, ALT 35), elevated bilirubin of 3.4.  In the emergency room, his hemoglobin of 9.6 reflects a 7 g decrease from his baseline 6 months earlier, when his hemoglobin was 16.7.   Past Medical History:  Diagnosis Date  . Arthritis   . PONV (postoperative nausea and vomiting)   . Tears of meniscus and ACL of left knee 03/19/2018    Past Surgical History:  Procedure Laterality Date  . ARTHROSCOPIC REPAIR ACL     x2  . HERNIA REPAIR     x3  . KNEE ARTHROSCOPY WITH ANTERIOR CRUCIATE LIGAMENT (ACL) REPAIR WITH HAMSTRING GRAFT Left 03/20/2018   Procedure: KNEE ARTHROSCOPY WITH REVESION ANTERIOR CRUCIATE LIGAMENT (ACL) REPAIR WITH  HAMSTRING  ALLOGRAFT,;  Surgeon: Elsie Saas, MD;  Location: [facility];  Service: Orthopedics;  Laterality: Left;  . KNEE ARTHROSCOPY WITH LATERAL MENISECTOMY Left 03/20/2018   Procedure: KNEE ARTHROSCOPY WITH LATERAL MENISECTOMY;  Surgeon: Elsie Saas, MD;  Location: [facility];  Service: Orthopedics;  Laterality: Left;  . KNEE ARTHROSCOPY WITH MEDIAL MENISECTOMY Left 03/20/2018   Procedure: KNEE ARTHROSCOPY WITH MEDIAL MENISECTOMY;  Surgeon: Elsie Saas, MD;  Location: [facility];  Service: Orthopedics;  Laterality: Left;    Prior to Admission medications   Medication Sig Start Date End Date Taking? Authorizing Provider  clonazePAM (KLONOPIN) 1 MG tablet TAKE 1 TABLET AT BEDTIME Patient taking differently: Take 1 mg by mouth at bedtime.  07/02/18  Yes [name], MD  Multiple Vitamin (MULTIVITAMIN WITH MINERALS) TABS tablet Take 1 tablet by mouth daily.   Yes [provider]    Current Facility-Administered Medications  Medication Dose Route Frequency Provider Last Rate Last Dose  . [MAR Hold] 0.9 %  sodium chloride infusion (Manually program via Guardrails IV Fluids)   Intravenous Once Shela Leff, MD      . 0.9 %  sodium chloride infusion   Intravenous Continuous Shela Leff, MD 150 mL/hr at 10/05/18 0417    . [MAR Hold] cefTRIAXone (ROCEPHIN) 2 g in sodium chloride 0.9 % 100 mL IVPB  2 g Intravenous Q24H Shela Leff, MD      . Doug Sou Hold] folic acid (FOLVITE) tablet 1 mg  1 mg Oral Daily Shela Leff, MD      . [  MAR Hold] LORazepam (ATIVAN) tablet 1-4 mg  1-4 mg Oral Q1H PRN Shela Leff, MD       Or  . Doug Sou Hold] LORazepam (ATIVAN) injection 1-4 mg  1-4 mg Intravenous Q1H PRN Shela Leff, MD   3 mg at 10/05/18 [id number]  . [MAR Hold] multivitamin with minerals tablet 1 tablet  1 tablet Oral Daily Shela Leff, MD      . octreotide (SANDOSTATIN) 500 mcg in sodium chloride 0.9 % 250 mL (2 mcg/mL)  infusion  50 mcg/hr Intravenous Continuous Shela Leff, MD 25 mL/hr at 10/05/18 0119 50 mcg/hr at 10/05/18 0119  . [MAR Hold] ondansetron (ZOFRAN) injection 4 mg  4 mg Intravenous Q6H PRN Shela Leff, MD      . pantoprazole (PROTONIX) 80 mg in sodium chloride 0.9 % 250 mL (0.32 mg/mL) infusion  8 mg/hr Intravenous Continuous Shela Leff, MD 25 mL/hr at 10/05/18 0215 8 mg/hr at 10/05/18 0215  . [MAR Hold] potassium chloride 10 mEq in 100 mL IVPB  10 mEq Intravenous Q1 Hr x 6 Shela Leff, MD 100 mL/hr at 10/05/18 0802 10 mEq at 10/05/18 0802  . [MAR Hold] sodium chloride flush (NS) 0.9 % injection 3 mL  3 mL Intravenous Once Shela Leff, MD      . Doug Sou Hold] thiamine (VITAMIN B-1) tablet 100 mg  100 mg Oral Daily Shela Leff, MD       Or  . Doug Sou Hold] thiamine (B-1) injection 100 mg  100 mg Intravenous Daily Shela Leff, MD        Allergies as of 10/04/2018  . (No Known Allergies)    Family History  Problem Relation Age of Onset  . Osteoarthritis Mother   . Coronary artery disease Father   . Hyperlipidemia Father     Social History   Socioeconomic History  . Marital status: Married    Spouse name: Not on file  . Number of children: Not on file  . Years of education: Not on file  . Highest education level: Not on file  Occupational History  . Not on file  Social Needs  . Financial resource strain: Not on file  . Food insecurity    Worry: Not on file    Inability: Not on file  . Transportation needs    Medical: Not on file    Non-medical: Not on file  Tobacco Use  . Smoking status: Never Smoker  . Smokeless tobacco: Never Used  Substance and Sexual Activity  . Alcohol use: Yes    Alcohol/week: 42.0 standard drinks    Types: 42 Cans of beer per week    Frequency: Never  . Drug use: No  . Sexual activity: Not on file  Lifestyle  . Physical activity    Days per week: Not on file    Minutes per session: Not on file  .  Stress: Not on file  Relationships  . Social Herbalist on phone: Not on file    Gets together: Not on file    Attends religious service: Not on file    Active member of club or organization: Not on file    Attends meetings of clubs or organizations: Not on file    Relationship status: Not on file  . Intimate partner violence    Fear of current or ex partner: Not on file    Emotionally abused: Not on file    Physically abused: Not on file    Forced sexual activity: Not  on file  Other Topics Concern  . Not on file  Social History Narrative  . Not on file    Review of Systems: Positive for alcohol abuse per HPI, negative for chest pain, shortness of breath, lower extremity edema, skin rashes, lymphadenopathy, joint effusions, urinary symptoms, anxiety/depression, focal neurologic symptoms  Physical Exam: Vital signs in last 24 hours: Temp:  [98.7 F (37.1 C)-99.1 F (37.3 C)] 99.1 F (37.3 C) (09/11 0850) Pulse Rate:  [101-130] 107 (09/11 0850) Resp:  [13-27] 21 (09/11 0850) BP: (101-142)/(64-99) 112/65 (09/11 0850) SpO2:  [87 %-100 %] 95 % (09/11 0850) Weight:  [79.4 kg] 79.4 kg (09/11 0850)   General: Obese, slightly icteric, alert and coherent, no acute distress, perhaps slightly anxious but not frankly tremulous. Head:  Normocephalic and atraumatic. Eyes:  No frank scleral icterus. Lungs:  Clear throughout to auscultation.   No wheezes, crackles, or rhonchi. No evident respiratory distress. Heart:   Regular rate and rhythm; no murmurs, clicks, rubs,  or gallops. Abdomen:  Soft, nontender, and nondistended. No masses, hepatosplenomegaly or ventral hernias noted.  Some flank tympany is present, suggesting the absence of significant ascites. Msk:   Symmetrical without gross deformities. Pulses:  Normal radial pulse is noted. Extremities:   Without edema. Neurologic:  Alert and coherent;  grossly normal neurologically.  No evident focal deficits.  No tremor at  present. Skin: Telangiectatic changes on upper anterior chest, possible spiders. Psych:   Alert and cooperative. Normal mood and affect.  Intake/Output from previous day: 09/10 0701 - 09/11 0700 In: 100 [IV Piggyback:100] Out: -  Intake/Output this shift: Total I/O In: 100 [IV Piggyback:100] Out: -   Lab Results: Recent Labs    10/04/18 1712 10/05/18 0310 10/05/18 0427  WBC 14.6*  --  6.9  HGB 12.3* 9.8* 9.6*  HCT 34.9* 28.0* 27.6*  PLT 90*  --  61*   BMET Recent Labs    10/04/18 1712 10/05/18 0427  NA 133* 135  K 2.7* 3.2*  CL 93* 102  CO2 26 23  GLUCOSE 110* 117*  BUN 13 17  CREATININE 0.57* 0.88  CALCIUM 8.8* 7.5*   LFT Recent Labs    10/05/18 0427  PROT 5.9*  ALBUMIN 2.7*  AST 64*  ALT 35  ALKPHOS 59  BILITOT 3.4*  BILIDIR 1.2*  IBILI 2.2*   PT/INR No results for input(s): LABPROT, INR in the last 72 hours.  Studies/Results: Ct Abdomen Pelvis W Contrast  Result Date: 10/04/2018 CLINICAL DATA:  Hematemesis, right upper quadrant pain for 2 days, nausea and vomiting onset today with hematemesis EXAM: CT ABDOMEN AND PELVIS WITH CONTRAST TECHNIQUE: Multidetector CT imaging of the abdomen and pelvis was performed using the standard protocol following bolus administration of intravenous contrast. CONTRAST:  166mL OMNIPAQUE IOHEXOL 300 MG/ML  SOLN COMPARISON:  None. FINDINGS: Lower chest: Lung bases are clear. Normal heart size. No pericardial effusion. Hepatobiliary: There is a lobular surface contour of the liver with patchy heterogeneous and predominantly peripheral enhancement. Heterogeneity persist on delayed imaging. There is a 1.8 cm hypoattenuating structure seen adjacent the gallbladder fossa with some nodular discontinuous peripheral enhancement evident on the excretory phase delay imaging suggestive of hepatic hemangioma. Surgical clip again along the falciform ligament. No gallbladder wall thickening. No biliary ductal dilatation or calcified  gallstones. Pancreas: Fatty replacement of the pancreas. No pancreatic ductal dilatation or surrounding inflammatory changes. Spleen: Borderline splenomegaly. Perisplenic venous collateralization. Adrenals/Urinary Tract: Normal adrenal glands. Areas of cortical scarring. Kidneys are otherwise unremarkable,  without renal calculi, suspicious lesion, or hydronephrosis. Bladder is unremarkable. Stomach/Bowel: There is mild thickening of the distal esophagus and gastric cardia. Distal stomach and duodenal sweep are unremarkable. No small bowel dilatation or wall thickening. A normal appendix is visualized. Colon is largely decompressed at the time of exam. No colonic dilatation or wall thickening. Vascular/Lymphatic: Atherosclerotic plaque within the normal caliber aorta. Upper abdominal venous collateralization is noted. There is nonspecific stranding near the level of the diaphragmatic hiatus and about the gastric cardia with reactive adenopathy. No pathologically enlarged nodes. Reproductive: The prostate and seminal vesicles are unremarkable. Other: Small volume perihepatic ascites (3/17). Small amount of tracking towards the right paracolic gutter and likely developing left paracolic gutter as well. Additional hazy stranding near the gastric cardia, as above. No free extraperitoneal air. Postsurgical changes from prior ventral hernia repair Musculoskeletal: No acute osseous abnormality or suspicious osseous lesion. IMPRESSION: 1. Stigmata of cirrhosis with evidence of portal hypertension including splenomegaly and upper abdominal venous collateralization including paraesophageal and gastric varices/venous collaterals and trace ascites. 2. Thickening of the distal esophagus and gastric cardia with adjacent stranding and reactive adenopathy, suggestive of esophagitis. In the setting of hematemesis and cirrhotic features, may be further evaluated with endoscopy to assess for variceal bleeding. 3. Hypoattenuating lesion  along the gallbladder fossa in the anterior right lobe liver, features suggest hepatic hemangioma though incompletely evaluated on this exam. Recommend dedicated hepatic MRI in the nonacute setting given underlying cirrhosis. 4. Aortic atherosclerosis (ICD10-I70.0). 5. Prior ventral hernia repair. Electronically Signed   By: Lovena Le M.D.   On: 10/04/2018 22:32    Impression: 1.  Upper GI bleed characterized by recurrent hematemesis  2.  Posthemorrhagic anemia, moderately severe 3.  Radiographic, biochemical, and physical exam evidence for cirrhosis with history of excessive ethanol consumption 4.  Erythrocytosis at baseline (hemoglobin 16.7), question sleep apnea   Plan: Endoscopic evaluation this morning while continuing empiric medical therapy.  Petra Kuba, purpose, risks of endoscopy reviewed and patient agreeable.  Further management to depend on endoscopic findings.   LOS: 0 days   Youlanda Mighty Special Ranes  10/05/2018, 9:13 AM   Pager [phone] If no answer or after 5 PM call [phone]

## 2018-10-05 NOTE — ED Notes (Signed)
Pt vomited 316ml dark red blood   MD paged

## 2018-10-05 NOTE — ED Notes (Signed)
RN for [phone] unable to take report at this time. RN to look over handoff report and call back with questions

## 2018-10-05 NOTE — Transfer of Care (Signed)
Immediate Anesthesia Transfer of Care Note  Patient: Joshua Hubbard  Procedure(s) Performed: ESOPHAGOGASTRODUODENOSCOPY (EGD) WITH PROPOFOL (N/A ) BIOPSY  Patient Location: Endoscopy Unit  Anesthesia Type:General  Level of Consciousness: drowsy and patient cooperative  Airway & Oxygen Therapy: Patient Spontanous Breathing and Patient connected to nasal cannula oxygen  Post-op Assessment: Report given to RN, Post -op Vital signs reviewed and stable and Patient moving all extremities  Post vital signs: Reviewed and stable  Last Vitals:  Vitals Value Taken Time  BP    Temp    Pulse    Resp    SpO2      Last Pain:  Vitals:   10/05/18 0850  TempSrc: Temporal  PainSc: 0-No pain         Complications: No apparent anesthesia complications

## 2018-10-05 NOTE — ED Provider Notes (Signed)
  Patient signed out pending admission.  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  History of alcohol abuse drinking approximately 6 pack of beer per day.  No known history of esophageal varices or upper GI bleed.  He has not followed by gastroenterology.  Had had hematemesis up until 24 hours prior to presentation.  This scared him and he discontinued drinking.  He has never been through alcohol withdrawal.  Work-up notable for hemoglobin of 12.3.  CT scan concerning for gastritis with visible esophageal varices.  Suspect upper GI bleed secondary to this.  At time of signout, he did not have any active bleeding and was clinically stable.  He was on CIWA protocol and is receiving Ativan.  He is typed and screened.  2:15 AM See clinical course below.  Hospitalist was consulted as well as gastroenterology.  He did have a large volume hematemesis.  Repeat hemoglobin obtained.  Will discuss with gastroenterology.  Dr. Marlowe Sax, hospitalist to evaluate as well.  3:26 AM Discussed with ICU.  They will evaluate for possible admission.  Patient has also discussed endoscopy with Dr. [name] over face time.  Physical Exam  BP 107/70   Pulse (!) 122   Temp 98.7 F (37.1 C) (Oral)   Resp (!) 24   SpO2 95%   Physical Exam Anxious appearing but nontoxic Tachycardia  ED Course/Procedures   Clinical Course as of Oct 04 324  Fri Oct 05, 2018  0200 Informed by nursing that patient had large volume hematemesis.  Approximately 300 cc.  On my evaluation, no longer vomiting.  Tachycardic into the 120s.  Reports anxiety but no significant pain.  Patient was bolused a liter.  Given 1 additional dose of Ativan now.  Will consult GI.  Patient does not have a prior GI doctor.   [CH]  [id number] Spoke with Dr. [name],, gastroenterology.  We will plan for endoscopy first thing in the morning.  If patient becomes unstable or has another large volume of hematemesis, will call immediately.   Also discussed with hospitalist.  She is concerned he may need ICU level care given his acute withdrawal as well.  Will discuss with intensivist   [CH]    Clinical Course User Index [CH] , Barbette Hair, MD    Procedures  CRITICAL CARE Performed by: Merryl Hacker   Total critical care time: 45 minutes  Critical care time was exclusive of separately billable procedures and treating other patients.  Critical care was necessary to treat or prevent imminent or life-threatening deterioration.  Critical care was time spent personally by me on the following activities: development of treatment plan with patient and/or surrogate as well as nursing, discussions with consultants, evaluation of patient's response to treatment, examination of patient, obtaining history from patient or surrogate, ordering and performing treatments and interventions, ordering and review of laboratory studies, ordering and review of radiographic studies, pulse oximetry and re-evaluation of patient's condition.   Problem List Items Addressed This Visit    None    Visit Diagnoses    Hematemesis with nausea    -  Primary   Esophagitis       Alcoholic cirrhosis, unspecified whether ascites present ([place])       Alcohol withdrawal syndrome without complication ([facility])             Dina Rich, Barbette Hair, MD 10/05/18 [phone]

## 2018-10-05 NOTE — Anesthesia Preprocedure Evaluation (Signed)
Anesthesia Evaluation  Patient identified by MRN, date of birth, ID band Patient awake    Reviewed: Allergy & Precautions, NPO status , Patient's Chart, lab work & pertinent test results  History of Anesthesia Complications (+) PONV and history of anesthetic complications  Airway Mallampati: II  TM Distance: >3 FB Neck ROM: Full    Dental  (+) Teeth Intact, Dental Advisory Given   Pulmonary neg pulmonary ROS,    Pulmonary exam normal breath sounds clear to auscultation       Cardiovascular Exercise Tolerance: Good negative cardio ROS Normal cardiovascular exam Rhythm:Regular Rate:Normal     Neuro/Psych negative neurological ROS  negative psych ROS   GI/Hepatic negative GI ROS, (+)     substance abuse  alcohol use, Large volume hematemesis overnight, admitted through ED 6 beers/day   Endo/Other  negative endocrine ROS  Renal/GU negative Renal ROS  negative genitourinary   Musculoskeletal  (+) Arthritis , Osteoarthritis,   LEFT KNEE MEDIAL AND LATERAL MENISCUS TEARS   Abdominal   Peds  Hematology negative hematology ROS (+)   Anesthesia Other Findings Day of surgery medications reviewed with the patient.  Reproductive/Obstetrics negative OB ROS                             Anesthesia Physical  Anesthesia Plan  ASA: III and emergent  Anesthesia Plan: General   Post-op Pain Management:  Regional for Post-op pain   Induction: Intravenous, Rapid sequence and Cricoid pressure planned  PONV Risk Score and Plan: 3 and Midazolam, Dexamethasone and Ondansetron  Airway Management Planned: Oral ETT  Additional Equipment: None  Intra-op Plan:   Post-operative Plan: Extubation in OR  Informed Consent: I have reviewed the patients History and Physical, chart, labs and discussed the procedure including the risks, benefits and alternatives for the proposed anesthesia with the patient or  authorized representative who has indicated his/her understanding and acceptance.     Dental advisory given  Plan Discussed with: CRNA  Anesthesia Plan Comments:         Anesthesia Quick Evaluation

## 2018-10-05 NOTE — ED Notes (Signed)
Pt resting at this time.  St's he feels better after vomiting

## 2018-10-05 NOTE — Progress Notes (Signed)
PROGRESS NOTE  Joshua Hubbard  DOB: 12-11-1975  PCP: [name], MD VFI:[id number]  DOA: 10/04/2018  LOS: 0 days   Brief narrative: Joshua Hubbard is a 43 y.o. male with medical history significant of arthritis, alcohol abuse presenting to the hospital for evaluation of abdominal pain and hematemesis.  Patient states he has been drinking alcohol heavily for several years.  Over time he did cut down on his drinking but at present continues to drink about 3-6 drinks per day which could either be beer or wine.  States his last drink was 3 days ago and he is now feeling very jittery.    9/10, he had a episode of large-volume hematemesis.  He describes it as at least a cupful of bright red blood.  He has also been having right upper quadrant abdominal pain since then.  Denies history of prior GI bleed. States he was told of having liver cirrhosis or year ago during his hernia surgery.  He however did not take it seriously at the time. Denies fevers, chills, chest pain, shortness of breath, or cough.  No other complaints.  ED Course: Tachycardic.  Blood pressure stable.  White count 14.6.  Hemoglobin 12.3, was 16.7 six months ago.  Platelet count 90,000, was normal on labs done 6 months ago.  Sodium 133, potassium 2.7.  BUN 13, creatinine 0.5.  AST 82, T bili 2.8.  ALT and alk phos normal.  Lipase normal.  SARS-CoV-2 test pending.  PT/INR pending.  CT abdomen pelvis showed stigmata of cirrhosis with evidence of portal hypertension including splenomegaly and upper abdominal venous collateralization including paraesophageal and gastric varices/venous collaterals and trace ascites.  Thickening of the distal esophagus and gastric cardia with adjacent stranding and reactive adenopathy suggestive of esophagitis. Patient was started on CIWA protocol and received octreotide and PPI in the ED.  Received IV magnesium 2 g and ceftriaxone.  Potassium supplementation ordered.  Received 1 L IV fluid bolus. After a few  hours patient had another episode of hematemesis in the ED.  Approximately 300 cc dark red blood.  Blood pressure stable but tachycardic with heart rate in the 120s.  Patient was admitted under hospitalist medicine service.  GI consultation was called.  Patient underwent EGD today.  Subjective: Patient was seen and examined this afternoon after endoscopy.  Assessment/Plan: Acute GI bleeding in the setting of alcoholic liver cirrhosis Acute blood loss anemia Hemoglobin was 16.7 six months ago.  Patient had one episode of hematemesis at home.  Hemoglobin 12.3 on initial labs done in the ED.  He then had an episode of hematemesis in the ED, approximately 300 cc dark red blood.  Repeat hemoglobin 9.8.  Persistently tachycardic.  Blood pressure stable. -Underwent EGD today.  Surprisingly no evidence of bleeding varices.  Portal gastropathy noted.  GI recommends continuation PPI infusion and octreotide infusion for next 1 to 2 days. -N.p.o. other than ice chips.  Alcohol withdrawal Patient drinks heavily on a regular basis.  Last drink was 3 days ago.  Currently displaying signs of withdrawal. -Progressive care CIWA protocol; Ativan PRN -Thiamine, folate, multivitamin  Alcoholic cirrhosis Most recent labs showing AST 64, ALT 35, alk phos 59, and T bili 3.4. -Continue to monitor LFTs -Check PT/INR -GI following  Hypokalemia/hypomagnesemia Potassium 2.7.  Magnesium 1.5. -Cardiac monitoring -Replete potassium and magnesium.  Continue to monitor.  Thrombocytopenia Likely related to alcohol abuse/ liver cirrhosis.  Most recent platelet count 61,000. -Continue to monitor closely  Liver lesion  CT showing hypoattenuating lesion along the gallbladder fossa in the anterior right lobe liver, features suggest hepatic hemangioma though incompletely evaluated on this exam. -Recommend dedicated hepatic MRI in the nonacute setting given underlying cirrhosis.   Body mass index is 25.84 kg/m.  Mobility: Encourage ambulation Diet: N.p.o. except ice chips DVT prophylaxis:  SCDs Code Status:   Code Status: Full Code  Family Communication:  Expected Discharge:  Inpatient hospitalization for at least next 2 days  Consultants:  GI  Procedures:    Antimicrobials: Anti-infectives (From admission, onward)   Start     Dose/Rate Route Frequency Ordered Stop   10/05/18 2200  cefTRIAXone (ROCEPHIN) 2 g in sodium chloride 0.9 % 100 mL IVPB     2 g 200 mL/hr over 30 Minutes Intravenous Every 24 hours 10/05/18 0346     10/04/18 2300  cefTRIAXone (ROCEPHIN) 1 g in sodium chloride 0.9 % 100 mL IVPB     1 g 200 mL/hr over 30 Minutes Intravenous  Once 10/04/18 2300 10/05/18 0247      Infusions:  . sodium chloride Stopped (10/05/18 1411)  . cefTRIAXone (ROCEPHIN)  IV    . octreotide  (SANDOSTATIN)    IV infusion 50 mcg/hr (10/05/18 1405)  . pantoprozole (PROTONIX) infusion 8 mg/hr (10/05/18 1408)    Scheduled Meds: . sodium chloride   Intravenous Once  . folic acid  1 mg Oral Daily  . multivitamin with minerals  1 tablet Oral Daily  . sodium chloride flush  3 mL Intravenous Once  . thiamine  100 mg Oral Daily   Or  . thiamine  100 mg Intravenous Daily    PRN meds: LORazepam **OR** LORazepam, ondansetron (ZOFRAN) IV   Objective: Vitals:   10/05/18 1014 10/05/18 1020  BP: 108/71 118/71  Pulse: (!) 103 (!) 106  Resp: (!) 24 (!) 21  Temp:    SpO2:      Intake/Output Summary (Last 24 hours) at 10/05/2018 1516 Last data filed at 10/05/2018 1100 Gross per 24 hour  Intake 1500 ml  Output -  Net 1500 ml   Filed Weights   10/05/18 0850  Weight: 79.4 kg   Weight change:  Body mass index is 25.84 kg/m.   Physical Exam: General exam: Appears calm and comfortable.  Skin: No rashes, lesions or ulcers. HEENT: Atraumatic, normocephalic, supple neck, no obvious bleeding Lungs: Clear to auscultation bilaterally CVS: Regular rate and rhythm, no murmur GI/Abd soft,  nontender, nondistended, bowel sound present CNS: Alert, awake, oriented x3, no evidence of withdrawal symptoms at the time of my evaluation Psychiatry: Mood appropriate, Extremities: No pedal edema, no calf tenderness  Data Review: I have personally reviewed the laboratory data and studies available.  Recent Labs  Lab 10/04/18 1712 10/05/18 0310 10/05/18 0427 10/05/18 1137  WBC 14.6*  --  6.9 5.5  HGB 12.3* 9.8* 9.6* 9.5*  HCT 34.9* 28.0* 27.6* 27.7*  MCV 103.9*  --  105.7* 107.4*  PLT 90*  --  61* 55*   Recent Labs  Lab 10/04/18 1712 10/05/18 0427  NA 133* 135  K 2.7* 3.2*  CL 93* 102  CO2 26 23  GLUCOSE 110* 117*  BUN 13 17  CREATININE 0.57* 0.88  CALCIUM 8.8* 7.5*  MG  --  1.5*  PHOS  --  3.2    Terrilee Croak, MD  [facility] 10/05/2018

## 2018-10-05 NOTE — Op Note (Signed)
[facility] Patient Name: Joshua Hubbard Procedure Date : 10/05/2018 MRN: [id number] Attending MD: Ronald Lobo , MD Date of Birth: November 28, 1975 CSN: [id number] Age: 43 Admit Type: Inpatient Procedure:                Upper GI endoscopy Indications:              Acute post hemorrhagic anemia, Hematemesis in a                            patient with newly-recognized liver disease from                            alcohol/obesity Providers:                Ronald Lobo, MD, Angus Seller, Maggie                            Chrismon, RN, Cletis Athens, Technician, Lina Sar, Technician, Claybon Jabs CRNA, CRNA Referring MD:              Medicines:                General Anesthesia Complications:            No immediate complications. Estimated Blood Loss:     Estimated blood loss was minimal. Procedure:                Pre-Anesthesia Assessment:                           - Prior to the procedure, a History and Physical                            was performed, and patient medications and                            allergies were reviewed. The patient's tolerance of                            previous anesthesia was also reviewed. The risks                            and benefits of the procedure and the sedation                            options and risks were discussed with the patient.                            All questions were answered, and informed consent                            was obtained. Prior Anticoagulants: The patient has                            taken  no previous anticoagulant or antiplatelet                            agents. ASA Grade Assessment: III - A patient with                            severe systemic disease. After reviewing the risks                            and benefits, the patient was deemed in                            satisfactory condition to undergo the procedure.                           After obtaining  informed consent, the endoscope was                            passed under direct vision. Throughout the                            procedure, the patient's blood pressure, pulse, and                            oxygen saturations were monitored continuously. The                            [id number] Olympus gastroscope was                            introduced through the mouth, and advanced to the                            second part of duodenum. The upper GI endoscopy was                            accomplished without difficulty. The patient                            tolerated the procedure well. Scope In: Scope Out: Findings:      The examined esophagus was normal. Minimal if any varices were seen (see       photos). There were certainly no medium or large varices or any stigmata       of hemorrhage in the esophagus.      There is no endoscopic evidence of significant varices or Mallory-Weiss       tear in the esophagus.      The stomach was entered and contained some dark gastric juice but no       blood, clot, or coffee grounds.      Moderate portal hypertensive gastropathy was found in the gastric fundus       and in the gastric body. Mild friability, characterized by contact       hemorrhage, was observed. Biopsies were taken with a cold forceps for  histology.      A single 8 mm frond-like mucosal papule (nodule) with no bleeding and no       stigmata of recent bleeding was found at the gastroesophageal junction.       Biopsies were taken with a cold forceps for histology.      The cardia and gastric fundus were normal on retroflexion.      There is no endoscopic evidence of varices in the cardia.      There is no endoscopic evidence of ulceration, angiodysplasia or mass in       the entire examined stomach.      Striped moderately erythematous mucosa without bleeding was found in the       prepyloric region of the stomach.      The examined duodenum was  normal. Impression:               - Normal esophagus.                           - Portal hypertensive gastropathy. Biopsied. This                            is the most likely source of the patient's                            bleeding, in that no alternative source was found,                            although the endoscopic findings do not match the                            severity of the patient's bleeding. (All areas were                            examined 3 times, without additional findings                            noted.)                           - A single mucosal papule (nodule) found in the                            stomach. Biopsied.                           - Erythematous mucosa in the prepyloric region of                            the stomach.                           - Normal examined duodenum. Recommendation:           - Await pathology results.                           - Continue present medications. Procedure Code(s):        ---  Professional ---                           [phone], Esophagogastroduodenoscopy, flexible,                            transoral; with biopsy, single or multiple Diagnosis Code(s):        --- Professional ---                           K76.6, Portal hypertension                           D62, Acute posthemorrhagic anemia                           K92.0, Hematemesis CPT copyright 2019 [employer]. All rights reserved. The codes documented in this report are preliminary and upon coder review may  be revised to meet current compliance requirements. Ronald Lobo, MD 10/05/2018 10:15:18 AM This report has been signed electronically. Number of Addenda: 0

## 2018-10-05 NOTE — Progress Notes (Signed)
Patient's endoscopy was well-tolerated and was pertinent for significant portal gastropathy, but, surprisingly, the absence of any significant varices.  No alternative source of bleeding other than portal gastropathy was found.  The patient had no active bleeding, nor any blood in the stomach at the time of the procedure.  In my opinion, the endoscopic findings do not seem proportional to the severity of blood loss.  Perhaps he has shown some improvement from the octreotide, and/or his portal pressures have been reduced by his large volume blood loss (he tends to run polycythemic at baseline, with a hemoglobin of 16.7, which may increase blood viscosity and portal pressures).  I have discussed the endoscopic findings with the patient and, by telephone, with his wife, and emphasized the importance of alcohol avoidance and weight loss going forward.  I would continue current management for now: Octreotide, IV Protonix, n.p.o. other than ice chips.    By tomorrow, if the patient remains stable, it would probably be okay to transition to oral Protonix and a clear liquid diet but I think he would need at least 2 or 3 days of octreotide to be on the safe side, considering the severity of his bleeding.  It might be worth considering a sleep apnea evaluation in view of his body habitus and his high hemoglobin level.  His procedure was done under general endotracheal tube anesthesia, so we would not have observed any sleep induced obstructive airway disease during this procedure as we might during standard propofol sedation.  We will follow the patient while he is in the hospital.  In the meantime, please feel free to call us anytime if the patient becomes unstable or you have any questions pertaining to his case.  Joshua Hubbard, M.D. Pager [phone] If no answer or after 5 PM call [phone]

## 2018-10-05 NOTE — H&P (Signed)
History and Physical    Joshua Hubbard ZTI:[id number] DOB: Feb 04, 1975 DOA: 10/04/2018  PCP: [name], MD Patient coming from: Home  Chief Complaint: Abdominal pain, hematemesis  HPI: Joshua Hubbard is a 43 y.o. male with medical history significant of arthritis, alcohol abuse presenting to the hospital for evaluation of abdominal pain and hematemesis.  Patient states he has been drinking alcohol heavily for several years.  Over time he did cut down on his drinking but at present continues to drink about 3-6 drinks per day which could either be beer or wine.  States his last drink was 3 days ago and he is now feeling very jittery.  Yesterday morning he had a episode of large-volume hematemesis.  He describes it as at least a cupful of bright red blood.  He has also been having right upper quadrant abdominal pain since yesterday.  Denies history of prior GI bleed.  States he has never been told he has liver cirrhosis and he has never seen a gastroenterologist before.  Denies fevers, chills, chest pain, shortness of breath, or cough.  No other complaints.  ED Course: Tachycardic.  Blood pressure stable.  White count 14.6.  Hemoglobin 12.3, was 16.7 six months ago.  Platelet count 90,000, was normal on labs done 6 months ago.  Sodium 133, potassium 2.7.  BUN 13, creatinine 0.5.  AST 82, T bili 2.8.  ALT and alk phos normal.  Lipase normal.  SARS-CoV-2 test pending.  PT/INR pending.  CT abdomen pelvis showing stigmata of cirrhosis with evidence of portal hypertension including splenomegaly and upper abdominal venous collateralization including paraesophageal and gastric varices/venous collaterals and trace ascites.  Thickening of the distal esophagus and gastric cardia with adjacent stranding and reactive adenopathy suggestive of esophagitis. Patient was started on CIWA protocol and received octreotide and PPI in the ED.  Received IV magnesium 2 g and ceftriaxone.  Potassium supplementation ordered.   Received 1 L IV fluid bolus. After a few hours patient had another episode of hematemesis in the ED.  Approximately 300 cc dark red blood.  Blood pressure stable but tachycardic with heart rate in the 120s.  Review of Systems:  All systems reviewed and apart from history of presenting illness, are negative.  Past Medical History:  Diagnosis Date   Arthritis    PONV (postoperative nausea and vomiting)    Tears of meniscus and ACL of left knee 03/19/2018    Past Surgical History:  Procedure Laterality Date   ARTHROSCOPIC REPAIR ACL     x2   HERNIA REPAIR     x3   KNEE ARTHROSCOPY WITH ANTERIOR CRUCIATE LIGAMENT (ACL) REPAIR WITH HAMSTRING GRAFT Left 03/20/2018   Procedure: KNEE ARTHROSCOPY WITH REVESION ANTERIOR CRUCIATE LIGAMENT (ACL) REPAIR WITH  HAMSTRING ALLOGRAFT,;  Surgeon: Elsie Saas, MD;  Location: [place];  Service: Orthopedics;  Laterality: Left;   KNEE ARTHROSCOPY WITH LATERAL MENISECTOMY Left 03/20/2018   Procedure: KNEE ARTHROSCOPY WITH LATERAL MENISECTOMY;  Surgeon: Elsie Saas, MD;  Location: [place];  Service: Orthopedics;  Laterality: Left;   KNEE ARTHROSCOPY WITH MEDIAL MENISECTOMY Left 03/20/2018   Procedure: KNEE ARTHROSCOPY WITH MEDIAL MENISECTOMY;  Surgeon: Elsie Saas, MD;  Location: [place];  Service: Orthopedics;  Laterality: Left;     reports that he has never smoked. He has never used smokeless tobacco. He reports current alcohol use. He reports that he does not use drugs.  No Known Allergies  Family History  Problem Relation  Age of Onset   Osteoarthritis Mother    Coronary artery disease Father    Hyperlipidemia Father     Prior to Admission medications   Medication Sig Start Date End Date Taking? Authorizing Provider  clonazePAM (KLONOPIN) 1 MG tablet TAKE 1 TABLET AT BEDTIME Patient taking differently: Take 1 mg by mouth at bedtime.  07/02/18  Yes [name], MD  Multiple Vitamin  (MULTIVITAMIN WITH MINERALS) TABS tablet Take 1 tablet by mouth daily.   Yes [provider]    Physical Exam: Vitals:   10/05/18 0615 10/05/18 0630 10/05/18 0645 10/05/18 0645  BP: 125/76 120/74 117/73 120/74  Pulse: (!) 108 (!) 107 (!) 116 (!) 110  Resp: 14 (!) 26 18   Temp:      TempSrc:      SpO2: 98% 97% 100%     Physical Exam  Constitutional: He is oriented to person, place, and time.  HENT:  Head: Normocephalic.  Eyes: Right eye exhibits no discharge. Left eye exhibits no discharge.  Neck: Neck supple.  Cardiovascular: Regular rhythm and intact distal pulses.  Tachycardic with heart rate in the 120s  Pulmonary/Chest: Effort normal and breath sounds normal. No respiratory distress. He has no wheezes. He has no rales.  Abdominal: Soft. Bowel sounds are normal. He exhibits no distension. There is abdominal tenderness. There is no rebound and no guarding.  Right upper quadrant tender to palpation  Musculoskeletal:        General: No edema.  Neurological: He is alert and oriented to person, place, and time.  Skin: Skin is warm. He is diaphoretic.  Appears anxious     Labs on Admission: I have personally reviewed following labs and imaging studies  CBC: Recent Labs  Lab 10/04/18 1712 10/05/18 0310 10/05/18 0427  WBC 14.6*  --  6.9  HGB 12.3* 9.8* 9.6*  HCT 34.9* 28.0* 27.6*  MCV 103.9*  --  105.7*  PLT 90*  --  61*   Basic Metabolic Panel: Recent Labs  Lab 10/04/18 1712 10/05/18 0427  NA 133* 135  K 2.7* 3.2*  CL 93* 102  CO2 26 23  GLUCOSE 110* 117*  BUN 13 17  CREATININE 0.57* 0.88  CALCIUM 8.8* 7.5*  MG  --  1.5*  PHOS  --  3.2   GFR: CrCl cannot be calculated (Unknown ideal weight.). Liver Function Tests: Recent Labs  Lab 10/04/18 1712 10/05/18 0427  AST 82* 64*  ALT 40 35  ALKPHOS 80 59  BILITOT 2.8* 3.4*  PROT 7.4 5.9*  ALBUMIN 3.3* 2.7*   Recent Labs  Lab 10/04/18 1712  LIPASE 41   No results for input(s): AMMONIA in  the last 168 hours. Coagulation Profile: No results for input(s): INR, PROTIME in the last 168 hours. Cardiac Enzymes: No results for input(s): CKTOTAL, CKMB, CKMBINDEX, TROPONINI in the last 168 hours. BNP (last 3 results) No results for input(s): PROBNP in the last 8760 hours. HbA1C: No results for input(s): HGBA1C in the last 72 hours. CBG: No results for input(s): GLUCAP in the last 168 hours. Lipid Profile: No results for input(s): CHOL, HDL, LDLCALC, TRIG, CHOLHDL, LDLDIRECT in the last 72 hours. Thyroid Function Tests: No results for input(s): TSH, T4TOTAL, FREET4, T3FREE, THYROIDAB in the last 72 hours. Anemia Panel: No results for input(s): VITAMINB12, FOLATE, FERRITIN, TIBC, IRON, RETICCTPCT in the last 72 hours. Urine analysis:    Component Value Date/Time   COLORURINE AMBER (A) 10/04/2018 [place] 10/04/2018  [place] 1.027 10/04/2018 1813   PHURINE 7.0 10/04/2018 [place] 10/04/2018 1813   HGBUR NEGATIVE 10/04/2018 1813   BILIRUBINUR NEGATIVE 10/04/2018 1813   KETONESUR 5 (A) 10/04/2018 1813   PROTEINUR NEGATIVE 10/04/2018 1813   NITRITE NEGATIVE 10/04/2018 1813   LEUKOCYTESUR NEGATIVE 10/04/2018 1813    Radiological Exams on Admission: Ct Abdomen Pelvis W Contrast  Result Date: 10/04/2018 CLINICAL DATA:  Hematemesis, right upper quadrant pain for 2 days, nausea and vomiting onset today with hematemesis EXAM: CT ABDOMEN AND PELVIS WITH CONTRAST TECHNIQUE: Multidetector CT imaging of the abdomen and pelvis was performed using the standard protocol following bolus administration of intravenous contrast. CONTRAST:  12m OMNIPAQUE IOHEXOL 300 MG/ML  SOLN COMPARISON:  None. FINDINGS: Lower chest: Lung bases are clear. Normal heart size. No pericardial effusion. Hepatobiliary: There is a lobular surface contour of the liver with patchy heterogeneous and predominantly peripheral enhancement. Heterogeneity persist on delayed imaging.  There is a 1.8 cm hypoattenuating structure seen adjacent the gallbladder fossa with some nodular discontinuous peripheral enhancement evident on the excretory phase delay imaging suggestive of hepatic hemangioma. Surgical clip again along the falciform ligament. No gallbladder wall thickening. No biliary ductal dilatation or calcified gallstones. Pancreas: Fatty replacement of the pancreas. No pancreatic ductal dilatation or surrounding inflammatory changes. Spleen: Borderline splenomegaly. Perisplenic venous collateralization. Adrenals/Urinary Tract: Normal adrenal glands. Areas of cortical scarring. Kidneys are otherwise unremarkable, without renal calculi, suspicious lesion, or hydronephrosis. Bladder is unremarkable. Stomach/Bowel: There is mild thickening of the distal esophagus and gastric cardia. Distal stomach and duodenal sweep are unremarkable. No small bowel dilatation or wall thickening. A normal appendix is visualized. Colon is largely decompressed at the time of exam. No colonic dilatation or wall thickening. Vascular/Lymphatic: Atherosclerotic plaque within the normal caliber aorta. Upper abdominal venous collateralization is noted. There is nonspecific stranding near the level of the diaphragmatic hiatus and about the gastric cardia with reactive adenopathy. No pathologically enlarged nodes. Reproductive: The prostate and seminal vesicles are unremarkable. Other: Small volume perihepatic ascites (3/17). Small amount of tracking towards the right paracolic gutter and likely developing left paracolic gutter as well. Additional hazy stranding near the gastric cardia, as above. No free extraperitoneal air. Postsurgical changes from prior ventral hernia repair Musculoskeletal: No acute osseous abnormality or suspicious osseous lesion. IMPRESSION: 1. Stigmata of cirrhosis with evidence of portal hypertension including splenomegaly and upper abdominal venous collateralization including paraesophageal and  gastric varices/venous collaterals and trace ascites. 2. Thickening of the distal esophagus and gastric cardia with adjacent stranding and reactive adenopathy, suggestive of esophagitis. In the setting of hematemesis and cirrhotic features, may be further evaluated with endoscopy to assess for variceal bleeding. 3. Hypoattenuating lesion along the gallbladder fossa in the anterior right lobe liver, features suggest hepatic hemangioma though incompletely evaluated on this exam. Recommend dedicated hepatic MRI in the nonacute setting given underlying cirrhosis. 4. Aortic atherosclerosis (ICD10-I70.0). 5. Prior ventral hernia repair. Electronically Signed   By: PLovena LeM.D.   On: 10/04/2018 22:32    EKG: Independently reviewed.  Sinus tachycardia, heart rate 120.  Assessment/Plan Principal Problem:   GI bleed Active Problems:   Liver cirrhosis (HCC)   Alcohol withdrawal (HCC)   Hypokalemia   Thrombocytopenia (HCC)  Acute variceal bleeding in the setting of alcoholic liver cirrhosis Hemoglobin was 16.7 six months ago.  Patient had one episode of hematemesis at home.  Hemoglobin 12.3 on initial labs done in the ED.  He then had an  episode of hematemesis in the ED, approximately 300 cc dark red blood.  Repeat hemoglobin 9.8.  Persistently tachycardic.  Blood pressure stable. -ED provider discussed the case with Dr. Lucile Shutters and PCCM ground team who did not feel that the patient needed to be admitted to the ICU.  Will monitor closely in the progressive care unit. -Dr. [name] has been consulted by ED provider, patient will be taken for EGD this morning. -Keep n.p.o. -Type and screen -2 units PRBCs -Serial H&H -2 large-bore IVs -IV PPI bolus and infusion -IV octreotide bolus and infusion -IV ceftriaxone -IV fluid -Zofran PRN nausea  Alcohol withdrawal Patient drinks heavily on a regular basis.  Last drink was 3 days ago.  Currently displaying signs of withdrawal. -Progressive care CIWA  protocol; Ativan PRN -Thiamine, folate, multivitamin  Alcoholic cirrhosis Most recent labs showing AST 64, ALT 35, alk phos 59, and T bili 3.4. -Continue to monitor LFTs -Check PT/INR -GI following  Hypokalemia Potassium 2.7.  Magnesium 1.5. -Cardiac monitoring -Replete potassium and magnesium.  Continue to monitor.  Thrombocytopenia Likely related to alcohol abuse/ liver cirrhosis.  Most recent platelet count 61,000. -Continue to monitor closely  Liver lesion CT showing hypoattenuating lesion along the gallbladder fossa in the anterior right lobe liver, features suggest hepatic hemangioma though incompletely evaluated on this exam. -Recommend dedicated hepatic MRI in the nonacute setting given underlying cirrhosis.  DVT prophylaxis: SCDs Code Status: Full code Family Communication: No family available. Disposition Plan: Anticipate discharge after clinical improvement. Consults called: GI Admission status: It is my clinical opinion that admission to INPATIENT is reasonable and necessary in this 43 y.o. male  presenting with symptoms of hematemesis, concerning for acute variceal bleed in the setting of alcoholic liver cirrhosis.  Patient continues to have hematemesis in the hospital.  Hemoglobin dropped significantly.  Very high risk of decompensation.  Also undergoing acute alcohol withdrawal.  Needs to be monitored closely in the progressive care unit.  He will be taken for EGD this morning.  This is Dr. Sheila Oats organically thank you Given the aforementioned, the predictability of an adverse outcome is felt to be significant. I expect that the patient will require at least 2 midnights in the hospital to treat this condition.   The medical decision making on this patient was of high complexity and the patient is at high risk for clinical deterioration, therefore this is a level 3 visit.  Shela Leff MD [facility] Pager [phone]  If 7PM-7AM, please contact  night-coverage [email or web address] Password [facility]  10/05/2018, 6:58 AM

## 2018-10-05 NOTE — ED Notes (Signed)
ED TO INPATIENT HANDOFF REPORT  ED Nurse Name and Phone #: Arby Barrette RN [phone]  S Name/Age/Gender Joshua Hubbard 43 y.o. male Room/Bed: H019C/H019C  Code Status   Code Status: Full Code  Home/SNF/Other Home Patient oriented to: self, place, time and situation Is this baseline? Yes   Triage Complete: Triage complete  Chief Complaint Esophagitis [K20.9] Alcohol withdrawal syndrome without complication ([place]) [id number] Hematemesis with nausea [id number] Alcoholic cirrhosis, unspecified whether ascites present ([place]) [K70.30]  Triage Note Patient reports RUQ pain x 2 days and N/V onset today with hematemesis. States he is trying to stop drinking, last drink Monday (was drinking 4 beers a day). NAD noted at this time.    Allergies No Known Allergies  Level of Care/Admitting Diagnosis ED Disposition    ED Disposition Condition [facility] Area: [place] [100100]  Level of Care: Progressive [102]  Covid Evaluation: Asymptomatic Screening Protocol (No Symptoms)  Diagnosis: GI bleed [id number]  Admitting Physician: Shela Leff [id number]  Attending Physician: Shela Leff [id number]  Estimated length of stay: past midnight tomorrow  Certification:: I certify this patient will need inpatient services for at least 2 midnights  PT Class (Do Not Modify): Inpatient [101]  PT Acc Code (Do Not Modify): Private [1]       B Medical/Surgery History Past Medical History:  Diagnosis Date  . Arthritis   . PONV (postoperative nausea and vomiting)   . Tears of meniscus and ACL of left knee 03/19/2018   Past Surgical History:  Procedure Laterality Date  . ARTHROSCOPIC REPAIR ACL     x2  . HERNIA REPAIR     x3  . KNEE ARTHROSCOPY WITH ANTERIOR CRUCIATE LIGAMENT (ACL) REPAIR WITH HAMSTRING GRAFT Left 03/20/2018   Procedure: KNEE ARTHROSCOPY WITH REVESION ANTERIOR CRUCIATE LIGAMENT (ACL) REPAIR WITH  HAMSTRING ALLOGRAFT,;  Surgeon: Elsie Saas,  MD;  Location: [facility];  Service: Orthopedics;  Laterality: Left;  . KNEE ARTHROSCOPY WITH LATERAL MENISECTOMY Left 03/20/2018   Procedure: KNEE ARTHROSCOPY WITH LATERAL MENISECTOMY;  Surgeon: Elsie Saas, MD;  Location: [facility];  Service: Orthopedics;  Laterality: Left;  . KNEE ARTHROSCOPY WITH MEDIAL MENISECTOMY Left 03/20/2018   Procedure: KNEE ARTHROSCOPY WITH MEDIAL MENISECTOMY;  Surgeon: Elsie Saas, MD;  Location: [facility];  Service: Orthopedics;  Laterality: Left;     A IV Location/Drains/Wounds Patient Lines/Drains/Airways Status   Active Line/Drains/Airways    Name:   Placement date:   Placement time:   Site:   Days:   Peripheral IV 10/04/18 Right Antecubital   10/04/18    2118    Antecubital   1   Peripheral IV 10/05/18 Right Forearm   10/05/18    0024    Forearm   less than 1   Peripheral IV 10/05/18 Right;Left Forearm   10/05/18    0048    Forearm   less than 1   Peripheral IV 10/05/18 Right;Left Forearm   10/05/18    0046    Forearm   less than 1   Incision (Closed) 03/20/18 Leg Left   03/20/18    1335     199          Intake/Output Last 24 hours  Intake/Output Summary (Last 24 hours) at 10/05/2018 1841 Last data filed at 10/05/2018 1100 Gross per 24 hour  Intake 1500 ml  Output -  Net 1500 ml    Labs/Imaging Results for orders placed or performed during the hospital  encounter of 10/04/18 (from the past 48 hour(s))  Lipase, blood     Status: None   Collection Time: 10/04/18  5:12 PM  Result Value Ref Range   Lipase 41 11 - 51 U/L    Comment: Performed at [facility], [address] [place], [place] [address]  Comprehensive metabolic panel     Status: Abnormal   Collection Time: 10/04/18  5:12 PM  Result Value Ref Range   Sodium 133 (L) 135 - 145 mmol/L   Potassium 2.7 (LL) 3.5 - 5.1 mmol/L    Comment: CRITICAL RESULT CALLED TO, READ BACK BY AND VERIFIED WITH: B SANGLANG,RN 1906 10/04/2018 WBOND     Chloride 93 (L) 98 - 111 mmol/L   CO2 26 22 - 32 mmol/L   Glucose, Bld 110 (H) 70 - 99 mg/dL   BUN 13 6 - 20 mg/dL   Creatinine, Ser 0.57 (L) 0.61 - 1.24 mg/dL   Calcium 8.8 (L) 8.9 - 10.3 mg/dL   Total Protein 7.4 6.5 - 8.1 g/dL   Albumin 3.3 (L) 3.5 - 5.0 g/dL   AST 82 (H) 15 - 41 U/L   ALT 40 0 - 44 U/L   Alkaline Phosphatase 80 38 - 126 U/L   Total Bilirubin 2.8 (H) 0.3 - 1.2 mg/dL   GFR calc non Af Amer >60 >60 mL/min   GFR calc Af Amer >60 >60 mL/min   Anion gap 14 5 - 15    Comment: Performed at [facility] [address], [place], [place] [address]  CBC     Status: Abnormal   Collection Time: 10/04/18  5:12 PM  Result Value Ref Range   WBC 14.6 (H) 4.0 - 10.5 K/uL   RBC 3.36 (L) 4.22 - 5.81 MIL/uL   Hemoglobin 12.3 (L) 13.0 - 17.0 g/dL   HCT 34.9 (L) 39.0 - 52.0 %   MCV 103.9 (H) 80.0 - 100.0 fL   MCH 36.6 (H) 26.0 - 34.0 pg   MCHC 35.2 30.0 - 36.0 g/dL   RDW 12.7 11.5 - 15.5 %   Platelets 90 (L) 150 - 400 K/uL    Comment: REPEATED TO VERIFY PLATELET COUNT CONFIRMED BY SMEAR Immature Platelet Fraction may be clinically indicated, consider ordering this additional test [id number]    nRBC 0.0 0.0 - 0.2 %    Comment: Performed at [facility], [place] [address], [place], [place] [address]  Urinalysis, Routine w reflex microscopic     Status: Abnormal   Collection Time: 10/04/18  6:13 PM  Result Value Ref Range   Color, Urine AMBER (A) YELLOW    Comment: BIOCHEMICALS MAY BE AFFECTED BY COLOR   APPearance CLEAR CLEAR   Specific Gravity, Urine 1.027 1.005 - 1.030   pH 7.0 5.0 - 8.0   Glucose, UA NEGATIVE NEGATIVE mg/dL   Hgb urine dipstick NEGATIVE NEGATIVE   Bilirubin Urine NEGATIVE NEGATIVE   Ketones, ur 5 (A) NEGATIVE mg/dL   Protein, ur NEGATIVE NEGATIVE mg/dL   Nitrite NEGATIVE NEGATIVE   Leukocytes,Ua NEGATIVE NEGATIVE   RBC / HPF 0-5 0 - 5 RBC/hpf   WBC, UA 0-5 0 - 5 WBC/hpf   Bacteria, UA NONE SEEN NONE SEEN   Squamous Epithelial / LPF 0-5  0 - 5   Mucus PRESENT     Comment: Performed at [facility], [place] [address], [place], [place] [address]  Type and screen [place]  Status: None (Preliminary result)   Collection Time: 10/05/18 12:20 AM  Result Value Ref Range   ABO/RH(D) AB POS    Antibody Screen NEG    Sample Expiration      10/08/2018,2359 Performed at [facility], [place] [address] [place], [place] [address]    Unit Number [id number]    Blood Component Type RED CELLS,LR    Unit division 00    Status of Unit ALLOCATED    Transfusion Status OK TO TRANSFUSE    Crossmatch Result Compatible    Unit Number [id number]    Blood Component Type RBC LR PHER2    Unit division 00    Status of Unit ALLOCATED    Transfusion Status OK TO TRANSFUSE    Crossmatch Result Compatible   ABO/Rh     Status: None   Collection Time: 10/05/18 12:20 AM  Result Value Ref Range   ABO/RH(D)      AB POS Performed at [facility], [place] [address], [place], [place] [address]   SARS Coronavirus 2 [facility] order, Performed in [facility] hospital lab) Nasopharyngeal Nasopharyngeal Swab     Status: None   Collection Time: 10/05/18  1:14 AM   Specimen: Nasopharyngeal Swab  Result Value Ref Range   SARS Coronavirus 2 NEGATIVE NEGATIVE    Comment: (NOTE) If result is NEGATIVE SARS-CoV-2 target nucleic acids are NOT DETECTED. The SARS-CoV-2 RNA is generally detectable in upper and lower  respiratory specimens during the acute phase of infection. The lowest  concentration of SARS-CoV-2 viral copies this assay can detect is 250  copies / mL. A negative result does not preclude SARS-CoV-2 infection  and should not be used as the sole basis for treatment or other  patient management decisions.  A negative result may occur with  improper specimen collection / handling, submission of specimen other  than nasopharyngeal swab, presence of viral mutation(s) within the  areas targeted by this assay, and  inadequate number of viral copies  (<250 copies / mL). A negative result must be combined with clinical  observations, patient history, and epidemiological information. If result is POSITIVE SARS-CoV-2 target nucleic acids are DETECTED. The SARS-CoV-2 RNA is generally detectable in upper and lower  respiratory specimens dur ing the acute phase of infection.  Positive  results are indicative of active infection with SARS-CoV-2.  Clinical  correlation with patient history and other diagnostic information is  necessary to determine patient infection status.  Positive results do  not rule out bacterial infection or co-infection with other viruses. If result is PRESUMPTIVE POSTIVE SARS-CoV-2 nucleic acids MAY BE PRESENT.   A presumptive positive result was obtained on the submitted specimen  and confirmed on repeat testing.  While 2019 novel coronavirus  (SARS-CoV-2) nucleic acids may be present in the submitted sample  additional confirmatory testing may be necessary for epidemiological  and / or clinical management purposes  to differentiate between  SARS-CoV-2 and other Sarbecovirus currently known to infect humans.  If clinically indicated additional testing with an alternate test  methodology [id number]) is advised. The SARS-CoV-2 RNA is generally  detectable in upper and lower respiratory sp ecimens during the acute  phase of infection. The expected result is Negative. Fact Sheet for Patients:  StrictlyIdeas.no Fact Sheet for Healthcare Providers: BankingDealers.co.za This test is not yet approved or cleared by the [place] FDA and has been authorized for detection and/or diagnosis of SARS-CoV-2 by FDA under an Emergency Use Authorization (EUA).  This  EUA will remain in effect (meaning this test can be used) for the duration of the COVID-19 declaration under Section 564(b)(1) of the Act, 21 U.S.C. section 360bbb-3(b)(1), unless the  authorization is terminated or revoked sooner. Performed at [facility], [place] [address], [place], [place] [address]   Hemoglobin and hematocrit, blood     Status: Abnormal   Collection Time: 10/05/18  3:10 AM  Result Value Ref Range   Hemoglobin 9.8 (L) 13.0 - 17.0 g/dL    Comment: REPEATED TO VERIFY   HCT 28.0 (L) 39.0 - 52.0 %    Comment: Performed at [facility] [address] [place], [place] [address]  HIV antibody (Routine Testing)     Status: None   Collection Time: 10/05/18  4:27 AM  Result Value Ref Range   HIV Screen 4th Generation wRfx Non Reactive Non Reactive    Comment: (NOTE) Performed At: [facility] [place], [place] [id number] Rush Farmer MD [id number]   CBC     Status: Abnormal   Collection Time: 10/05/18  4:27 AM  Result Value Ref Range   WBC 6.9 4.0 - 10.5 K/uL   RBC 2.61 (L) 4.22 - 5.81 MIL/uL   Hemoglobin 9.6 (L) 13.0 - 17.0 g/dL   HCT 27.6 (L) 39.0 - 52.0 %   MCV 105.7 (H) 80.0 - 100.0 fL   MCH 36.8 (H) 26.0 - 34.0 pg   MCHC 34.8 30.0 - 36.0 g/dL   RDW 12.9 11.5 - 15.5 %   Platelets 61 (L) 150 - 400 K/uL    Comment: REPEATED TO VERIFY PLATELET COUNT CONFIRMED BY SMEAR Immature Platelet Fraction may be clinically indicated, consider ordering this additional test [id number] CONSISTENT WITH PREVIOUS RESULT    nRBC 0.0 0.0 - 0.2 %    Comment: Performed at [facility], [place] [address] [place], [place] [address]  Magnesium     Status: Abnormal   Collection Time: 10/05/18  4:27 AM  Result Value Ref Range   Magnesium 1.5 (L) 1.7 - 2.4 mg/dL    Comment: Performed at [facility] [address] [place], [place] [address]  Phosphorus     Status: None   Collection Time: 10/05/18  4:27 AM  Result Value Ref Range   Phosphorus 3.2 2.5 - 4.6 mg/dL    Comment: Performed at [facility] [address], [place], [place] [address]  Hepatic function panel     Status: Abnormal   Collection Time:  10/05/18  4:27 AM  Result Value Ref Range   Total Protein 5.9 (L) 6.5 - 8.1 g/dL   Albumin 2.7 (L) 3.5 - 5.0 g/dL   AST 64 (H) 15 - 41 U/L   ALT 35 0 - 44 U/L   Alkaline Phosphatase 59 38 - 126 U/L   Total Bilirubin 3.4 (H) 0.3 - 1.2 mg/dL   Bilirubin, Direct 1.2 (H) 0.0 - 0.2 mg/dL   Indirect Bilirubin 2.2 (H) 0.3 - 0.9 mg/dL    Comment: Performed at [facility] [address], [place], [place] [id number]  Basic metabolic panel     Status: Abnormal   Collection Time: 10/05/18  4:27 AM  Result Value Ref Range   Sodium 135 135 - 145 mmol/L   Potassium 3.2 (L) 3.5 - 5.1 mmol/L   Chloride 102 98 - 111 mmol/L   CO2 23 22 - 32 mmol/L   Glucose, Bld 117 (H) 70 -  99 mg/dL   BUN 17 6 - 20 mg/dL   Creatinine, Ser 0.88 0.61 - 1.24 mg/dL   Calcium 7.5 (L) 8.9 - 10.3 mg/dL   GFR calc non Af Amer >60 >60 mL/min   GFR calc Af Amer >60 >60 mL/min   Anion gap 10 5 - 15    Comment: Performed at [facility] [address], [place], [place] [address]  Prepare RBC     Status: None   Collection Time: 10/05/18  6:33 AM  Result Value Ref Range   Order Confirmation      ORDER PROCESSED BY BLOOD BANK Performed at [facility], [place] [address] [place], [place] [address]   CBC     Status: Abnormal   Collection Time: 10/05/18 11:37 AM  Result Value Ref Range   WBC 5.5 4.0 - 10.5 K/uL   RBC 2.58 (L) 4.22 - 5.81 MIL/uL   Hemoglobin 9.5 (L) 13.0 - 17.0 g/dL   HCT 27.7 (L) 39.0 - 52.0 %   MCV 107.4 (H) 80.0 - 100.0 fL   MCH 36.8 (H) 26.0 - 34.0 pg   MCHC 34.3 30.0 - 36.0 g/dL   RDW 13.0 11.5 - 15.5 %   Platelets 55 (L) 150 - 400 K/uL    Comment: REPEATED TO VERIFY Immature Platelet Fraction may be clinically indicated, consider ordering this additional test [id number] CONSISTENT WITH PREVIOUS RESULT    nRBC 0.0 0.0 - 0.2 %    Comment: Performed at [facility], [place] [address], [place], [place] [address]  CBC     Status: Abnormal   Collection Time: 10/05/18  5:15 PM   Result Value Ref Range   WBC 4.5 4.0 - 10.5 K/uL   RBC 2.60 (L) 4.22 - 5.81 MIL/uL   Hemoglobin 9.4 (L) 13.0 - 17.0 g/dL   HCT 28.1 (L) 39.0 - 52.0 %   MCV 108.1 (H) 80.0 - 100.0 fL   MCH 36.2 (H) 26.0 - 34.0 pg   MCHC 33.5 30.0 - 36.0 g/dL   RDW 12.9 11.5 - 15.5 %   Platelets 52 (L) 150 - 400 K/uL    Comment: REPEATED TO VERIFY Immature Platelet Fraction may be clinically indicated, consider ordering this additional test [id number] CONSISTENT WITH PREVIOUS RESULT    nRBC 0.0 0.0 - 0.2 %    Comment: Performed at [facility], [place] [address], [place], [place] [address]   Ct Abdomen Pelvis W Contrast  Result Date: 10/04/2018 CLINICAL DATA:  Hematemesis, right upper quadrant pain for 2 days, nausea and vomiting onset today with hematemesis EXAM: CT ABDOMEN AND PELVIS WITH CONTRAST TECHNIQUE: Multidetector CT imaging of the abdomen and pelvis was performed using the standard protocol following bolus administration of intravenous contrast. CONTRAST:  154mL OMNIPAQUE IOHEXOL 300 MG/ML  SOLN COMPARISON:  None. FINDINGS: Lower chest: Lung bases are clear. Normal heart size. No pericardial effusion. Hepatobiliary: There is a lobular surface contour of the liver with patchy heterogeneous and predominantly peripheral enhancement. Heterogeneity persist on delayed imaging. There is a 1.8 cm hypoattenuating structure seen adjacent the gallbladder fossa with some nodular discontinuous peripheral enhancement evident on the excretory phase delay imaging suggestive of hepatic hemangioma. Surgical clip again along the falciform ligament. No gallbladder wall thickening. No biliary ductal dilatation or calcified gallstones. Pancreas: Fatty replacement of the pancreas. No pancreatic ductal dilatation or surrounding inflammatory changes. Spleen: Borderline splenomegaly. Perisplenic venous collateralization. Adrenals/Urinary Tract: Normal adrenal glands. Areas of cortical scarring.  Kidneys are otherwise  unremarkable, without renal calculi, suspicious lesion, or hydronephrosis. Bladder is unremarkable. Stomach/Bowel: There is mild thickening of the distal esophagus and gastric cardia. Distal stomach and duodenal sweep are unremarkable. No small bowel dilatation or wall thickening. A normal appendix is visualized. Colon is largely decompressed at the time of exam. No colonic dilatation or wall thickening. Vascular/Lymphatic: Atherosclerotic plaque within the normal caliber aorta. Upper abdominal venous collateralization is noted. There is nonspecific stranding near the level of the diaphragmatic hiatus and about the gastric cardia with reactive adenopathy. No pathologically enlarged nodes. Reproductive: The prostate and seminal vesicles are unremarkable. Other: Small volume perihepatic ascites (3/17). Small amount of tracking towards the right paracolic gutter and likely developing left paracolic gutter as well. Additional hazy stranding near the gastric cardia, as above. No free extraperitoneal air. Postsurgical changes from prior ventral hernia repair Musculoskeletal: No acute osseous abnormality or suspicious osseous lesion. IMPRESSION: 1. Stigmata of cirrhosis with evidence of portal hypertension including splenomegaly and upper abdominal venous collateralization including paraesophageal and gastric varices/venous collaterals and trace ascites. 2. Thickening of the distal esophagus and gastric cardia with adjacent stranding and reactive adenopathy, suggestive of esophagitis. In the setting of hematemesis and cirrhotic features, may be further evaluated with endoscopy to assess for variceal bleeding. 3. Hypoattenuating lesion along the gallbladder fossa in the anterior right lobe liver, features suggest hepatic hemangioma though incompletely evaluated on this exam. Recommend dedicated hepatic MRI in the nonacute setting given underlying cirrhosis. 4. Aortic atherosclerosis (ICD10-I70.0). 5. Prior ventral hernia  repair. Electronically Signed   By: Lovena Le M.D.   On: 10/04/2018 22:32    Pending Labs Unresulted Labs (From admission, onward)    Start     Ordered   10/06/18 0500  CBC with Differential/Platelet  Daily,   R     10/05/18 0744   10/06/18 [id number]  Basic metabolic panel  Daily,   R     10/05/18 0744   10/06/18 0500  Magnesium  Tomorrow morning,   STAT     10/05/18 0744   10/04/18 2258  Protime-INR  ONCE - STAT,   STAT     10/04/18 2258          Vitals/Pain Today's Vitals   10/05/18 1014 10/05/18 1020 10/05/18 1421 10/05/18 1801  BP: 108/71 118/71    Pulse: (!) 103 (!) 106    Resp: (!) 24 (!) 21    Temp:      TempSrc:      SpO2:      Weight:      Height:      PainSc: 0-No pain 0-No pain 0-No pain 0-No pain    Isolation Precautions No active isolations  Medications Medications  sodium chloride flush (NS) 0.9 % injection 3 mL ( Intravenous MAR Unhold 10/05/18 1032)  octreotide (SANDOSTATIN) 2 mcg/mL load via infusion 50 mcg (50 mcg Intravenous Bolus from Bag 10/05/18 0117)    And  octreotide (SANDOSTATIN) 500 mcg in sodium chloride 0.9 % 250 mL (2 mcg/mL) infusion (50 mcg/hr Intravenous Rate/Dose Verify 10/05/18 1405)  pantoprazole (PROTONIX) 80 mg in sodium chloride 0.9 % 250 mL (0.32 mg/mL) infusion (8 mg/hr Intravenous Rate/Dose Verify 10/05/18 1408)  cefTRIAXone (ROCEPHIN) 2 g in sodium chloride 0.9 % 100 mL IVPB ( Intravenous MAR Unhold 10/05/18 1032)  LORazepam (ATIVAN) tablet 1-4 mg (1 mg Oral Given 10/05/18 1735)    Or  LORazepam (ATIVAN) injection 1-4 mg ( Intravenous See Alternative 10/05/18 1735)  thiamine (VITAMIN  B-1) tablet 100 mg ( Oral MAR Unhold 10/05/18 1032)    Or  thiamine (B-1) injection 100 mg ( Intravenous MAR Unhold [id number])  folic acid (FOLVITE) tablet 1 mg ( Oral MAR Unhold 10/05/18 1032)  multivitamin with minerals tablet 1 tablet ( Oral MAR Unhold 10/05/18 1032)  potassium chloride 10 mEq in 100 mL IVPB (10 mEq Intravenous Not Given 10/05/18  1403)  0.9 %  sodium chloride infusion ( Intravenous Stopped 10/05/18 1411)  ondansetron (ZOFRAN) injection 4 mg ( Intravenous MAR Unhold 10/05/18 1032)  0.9 %  sodium chloride infusion (Manually program via Guardrails IV Fluids) ( Intravenous Hold 10/05/18 1410)  sodium chloride 0.9 % bolus 1,000 mL (0 mLs Intravenous Stopped 10/05/18 1059)  potassium chloride 10 mEq in 100 mL IVPB (0 mEq Intravenous Stopped 10/05/18 0247)  magnesium sulfate IVPB 2 g 50 mL (0 g Intravenous Stopped 10/05/18 0116)  LORazepam (ATIVAN) injection 1 mg (1 mg Intravenous Given 10/05/18 0053)  iohexol (OMNIPAQUE) 300 MG/ML solution 100 mL (100 mLs Intravenous Contrast Given 10/04/18 2206)  pantoprazole (PROTONIX) 80 mg in sodium chloride 0.9 % 100 mL IVPB (0 mg Intravenous Stopped 10/05/18 0247)  ondansetron (ZOFRAN) injection 4 mg (4 mg Intravenous Given 10/05/18 0132)  cefTRIAXone (ROCEPHIN) 1 g in sodium chloride 0.9 % 100 mL IVPB (0 g Intravenous Stopped 10/05/18 0247)  potassium chloride 10 MEQ/100ML IVPB (0 mEq  Stopped 10/05/18 1402)  LORazepam (ATIVAN) injection 1 mg (1 mg Intravenous Given 10/05/18 0233)    Mobility walks Low fall risk   Focused Assessments Neuro Assessment Handoff:  Swallow screen pass? No          Neuro Assessment:   Neuro Checks:      Last Documented NIHSS Modified Score:   Has TPA been given? No If patient is a Neuro Trauma and patient is going to OR before floor call report to [place] nurse: [phone] or [phone]  , Pulmonary Assessment Handoff:  Lung sounds:   O2 Device: Room Air O2 Flow Rate (L/min): 3 L/min      R Recommendations: See Admitting Provider Note  Report given to:   Additional Notes:

## 2018-10-05 NOTE — ED Notes (Signed)
Wife, [name]: [phone]

## 2018-10-05 NOTE — Progress Notes (Signed)
   10/05/18 2109  Vitals  Temp 98.6 F (37 C)  Temp Source Oral  BP 112/69  MAP (mmHg) 83  BP Location Right Arm  BP Method Automatic  Patient Position (if appropriate) Lying  Pulse Rate Source Monitor  ECG Heart Rate 98  Resp 18  Oxygen Therapy  SpO2 98 %  O2 Device Room Air  MEWS Score  MEWS RR 0  MEWS Pulse 0  MEWS Systolic 0  MEWS LOC 0  MEWS Temp 0  MEWS Score 0  MEWS Score Color Green   The patient is admitted to [address] with the diagnosis of GI bleed. A & O x 4. Denied any acute pain at this time. The patient is oriented to his room, ascom/ call bell and staff. Bed alarm activated for his safety due to withdrawal symptoms. Full assessment to epic completed. Will continue to monitor.

## 2018-10-06 LAB — CBC WITH DIFFERENTIAL/PLATELET
Abs Immature Granulocytes: 0.03 10*3/uL (ref 0.00–0.07)
Basophils Absolute: 0 10*3/uL (ref 0.0–0.1)
Basophils Relative: 0 %
Eosinophils Absolute: 0 10*3/uL (ref 0.0–0.5)
Eosinophils Relative: 0 %
HCT: 27.3 % — ABNORMAL LOW (ref 39.0–52.0)
Hemoglobin: 9.3 g/dL — ABNORMAL LOW (ref 13.0–17.0)
Immature Granulocytes: 1 %
Lymphocytes Relative: 11 %
Lymphs Abs: 0.5 10*3/uL — ABNORMAL LOW (ref 0.7–4.0)
MCH: 36.2 pg — ABNORMAL HIGH (ref 26.0–34.0)
MCHC: 34.1 g/dL (ref 30.0–36.0)
MCV: 106.2 fL — ABNORMAL HIGH (ref 80.0–100.0)
Monocytes Absolute: 0.3 10*3/uL (ref 0.1–1.0)
Monocytes Relative: 6 %
Neutro Abs: 4.2 10*3/uL (ref 1.7–7.7)
Neutrophils Relative %: 82 %
Platelets: 58 10*3/uL — ABNORMAL LOW (ref 150–400)
RBC: 2.57 MIL/uL — ABNORMAL LOW (ref 4.22–5.81)
RDW: 12.5 % (ref 11.5–15.5)
WBC: 5.1 10*3/uL (ref 4.0–10.5)
nRBC: 0 % (ref 0.0–0.2)

## 2018-10-06 LAB — BASIC METABOLIC PANEL
Anion gap: 10 (ref 5–15)
BUN: 9 mg/dL (ref 6–20)
CO2: 21 mmol/L — ABNORMAL LOW (ref 22–32)
Calcium: 7.6 mg/dL — ABNORMAL LOW (ref 8.9–10.3)
Chloride: 105 mmol/L (ref 98–111)
Creatinine, Ser: 0.64 mg/dL (ref 0.61–1.24)
GFR calc Af Amer: >60 mL/min (ref >60)
GFR calc non Af Amer: >60 mL/min (ref >60)
Glucose, Bld: 125 mg/dL — ABNORMAL HIGH (ref 70–99)
Potassium: 3.6 mmol/L (ref 3.5–5.1)
Sodium: 136 mmol/L (ref 135–145)

## 2018-10-06 LAB — MAGNESIUM: Magnesium: 1.8 mg/dL (ref 1.7–2.4)

## 2018-10-06 MED ORDER — PROPRANOLOL HCL 10 MG PO TABS
10.0000 mg | ORAL_TABLET | Freq: Two times a day (BID) | ORAL | Status: DC
  Filled 2018-10-06: qty 1

## 2018-10-06 NOTE — Progress Notes (Signed)
Subjective: The patient was seen and examined at bedside.  Not had any further episodes of hematemesis and melena since postprocedure.   He is requesting for his diet to be advanced.  Objective: Vital signs in last 24 hours: Temp:  [97.7 F (36.5 C)-98.6 F (37 C)] 98.2 F (36.8 C) (09/12 0800) Pulse Rate:  [82-100] 83 (09/12 0800) Resp:  [14-23] 23 (09/12 0411) BP: (99-117)/(68-75) 111/73 (09/12 0800) SpO2:  [96 %-100 %] 97 % (09/12 0411) Weight change:  Last BM Date: 10/05/18  PE: Appears comfortable GENERAL: Mild to moderate, mild icterus ABDOMEN: Soft, nondistended, no evidence of shifting dullness or fluid thrill EXTREMITIES: No deformity  Lab Results: Results for orders placed or performed during the hospital encounter of 10/04/18 (from the past 48 hour(s))  Lipase, blood     Status: None   Collection Time: 10/04/18  5:12 PM  Result Value Ref Range   Lipase 41 11 - 51 U/L    Comment: Performed at [facility], [address] [place], [place] [address]  Comprehensive metabolic panel     Status: Abnormal   Collection Time: 10/04/18  5:12 PM  Result Value Ref Range   Sodium 133 (L) 135 - 145 mmol/L   Potassium 2.7 (LL) 3.5 - 5.1 mmol/L    Comment: CRITICAL RESULT CALLED TO, READ BACK BY AND VERIFIED WITH: B SANGLANG,RN 1906 10/04/2018 WBOND    Chloride 93 (L) 98 - 111 mmol/L   CO2 26 22 - 32 mmol/L   Glucose, Bld 110 (H) 70 - 99 mg/dL   BUN 13 6 - 20 mg/dL   Creatinine, Ser 0.57 (L) 0.61 - 1.24 mg/dL   Calcium 8.8 (L) 8.9 - 10.3 mg/dL   Total Protein 7.4 6.5 - 8.1 g/dL   Albumin 3.3 (L) 3.5 - 5.0 g/dL   AST 82 (H) 15 - 41 U/L   ALT 40 0 - 44 U/L   Alkaline Phosphatase 80 38 - 126 U/L   Total Bilirubin 2.8 (H) 0.3 - 1.2 mg/dL   GFR calc non Af Amer >60 >60 mL/min   GFR calc Af Amer >60 >60 mL/min   Anion gap 14 5 - 15    Comment: Performed at [facility] [address], [place], [place] [address]  CBC     Status: Abnormal   Collection Time:  10/04/18  5:12 PM  Result Value Ref Range   WBC 14.6 (H) 4.0 - 10.5 K/uL   RBC 3.36 (L) 4.22 - 5.81 MIL/uL   Hemoglobin 12.3 (L) 13.0 - 17.0 g/dL   HCT 34.9 (L) 39.0 - 52.0 %   MCV 103.9 (H) 80.0 - 100.0 fL   MCH 36.6 (H) 26.0 - 34.0 pg   MCHC 35.2 30.0 - 36.0 g/dL   RDW 12.7 11.5 - 15.5 %   Platelets 90 (L) 150 - 400 K/uL    Comment: REPEATED TO VERIFY PLATELET COUNT CONFIRMED BY SMEAR Immature Platelet Fraction may be clinically indicated, consider ordering this additional test [id number]    nRBC 0.0 0.0 - 0.2 %    Comment: Performed at [facility], [place] [address] [place], [place] [address]  Urinalysis, Routine w reflex microscopic     Status: Abnormal   Collection Time: 10/04/18  6:13 PM  Result Value Ref Range   Color, Urine AMBER (A) YELLOW    Comment: BIOCHEMICALS MAY BE AFFECTED BY COLOR   APPearance CLEAR CLEAR   Specific Gravity, Urine 1.027 1.005 -  1.030   pH 7.0 5.0 - 8.0   Glucose, UA NEGATIVE NEGATIVE mg/dL   Hgb urine dipstick NEGATIVE NEGATIVE   Bilirubin Urine NEGATIVE NEGATIVE   Ketones, ur 5 (A) NEGATIVE mg/dL   Protein, ur NEGATIVE NEGATIVE mg/dL   Nitrite NEGATIVE NEGATIVE   Leukocytes,Ua NEGATIVE NEGATIVE   RBC / HPF 0-5 0 - 5 RBC/hpf   WBC, UA 0-5 0 - 5 WBC/hpf   Bacteria, UA NONE SEEN NONE SEEN   Squamous Epithelial / LPF 0-5 0 - 5   Mucus PRESENT     Comment: Performed at [facility], [place] [address], [place], [place] [address]  Type and screen [place]     Status: None (Preliminary result)   Collection Time: 10/05/18 12:20 AM  Result Value Ref Range   ABO/RH(D) AB POS    Antibody Screen NEG    Sample Expiration      10/08/2018,2359 Performed at [facility], [place] [address] [place], [place] [address]    Unit Number [id number]    Blood Component Type RED CELLS,LR    Unit division 00    Status of Unit ALLOCATED    Transfusion Status OK TO TRANSFUSE    Crossmatch Result Compatible    Unit Number  [id number]    Blood Component Type RBC LR PHER2    Unit division 00    Status of Unit ALLOCATED    Transfusion Status OK TO TRANSFUSE    Crossmatch Result Compatible   ABO/Rh     Status: None   Collection Time: 10/05/18 12:20 AM  Result Value Ref Range   ABO/RH(D)      AB POS Performed at [facility], [place] [address], [place], [place] [address]   SARS Coronavirus 2 [facility] order, Performed in [facility] hospital lab) Nasopharyngeal Nasopharyngeal Swab     Status: None   Collection Time: 10/05/18  1:14 AM   Specimen: Nasopharyngeal Swab  Result Value Ref Range   SARS Coronavirus 2 NEGATIVE NEGATIVE    Comment: (NOTE) If result is NEGATIVE SARS-CoV-2 target nucleic acids are NOT DETECTED. The SARS-CoV-2 RNA is generally detectable in upper and lower  respiratory specimens during the acute phase of infection. The lowest  concentration of SARS-CoV-2 viral copies this assay can detect is 250  copies / mL. A negative result does not preclude SARS-CoV-2 infection  and should not be used as the sole basis for treatment or other  patient management decisions.  A negative result may occur with  improper specimen collection / handling, submission of specimen other  than nasopharyngeal swab, presence of viral mutation(s) within the  areas targeted by this assay, and inadequate number of viral copies  (<250 copies / mL). A negative result must be combined with clinical  observations, patient history, and epidemiological information. If result is POSITIVE SARS-CoV-2 target nucleic acids are DETECTED. The SARS-CoV-2 RNA is generally detectable in upper and lower  respiratory specimens dur ing the acute phase of infection.  Positive  results are indicative of active infection with SARS-CoV-2.  Clinical  correlation with patient history and other diagnostic information is  necessary to determine patient infection status.  Positive results do  not rule out bacterial infection or  co-infection with other viruses. If result is PRESUMPTIVE POSTIVE SARS-CoV-2 nucleic acids MAY BE PRESENT.   A presumptive positive result was obtained on the submitted specimen  and confirmed on repeat testing.  While 2019 novel coronavirus  (SARS-CoV-2)  nucleic acids may be present in the submitted sample  additional confirmatory testing may be necessary for epidemiological  and / or clinical management purposes  to differentiate between  SARS-CoV-2 and other Sarbecovirus currently known to infect humans.  If clinically indicated additional testing with an alternate test  methodology [phone]) is advised. The SARS-CoV-2 RNA is generally  detectable in upper and lower respiratory sp ecimens during the acute  phase of infection. The expected result is Negative. Fact Sheet for Patients:  StrictlyIdeas.no Fact Sheet for Healthcare Providers: BankingDealers.co.za This test is not yet approved or cleared by the [place] FDA and has been authorized for detection and/or diagnosis of SARS-CoV-2 by FDA under an Emergency Use Authorization (EUA).  This EUA will remain in effect (meaning this test can be used) for the duration of the COVID-19 declaration under Section 564(b)(1) of the Act, 21 U.S.C. section 360bbb-3(b)(1), unless the authorization is terminated or revoked sooner. Performed at [facility], [place] [address] [place], [place] [address]   Hemoglobin and hematocrit, blood     Status: Abnormal   Collection Time: 10/05/18  3:10 AM  Result Value Ref Range   Hemoglobin 9.8 (L) 13.0 - 17.0 g/dL    Comment: REPEATED TO VERIFY   HCT 28.0 (L) 39.0 - 52.0 %    Comment: Performed at [facility] [address] [place], [place] [address]  HIV antibody (Routine Testing)     Status: None   Collection Time: 10/05/18  4:27 AM  Result Value Ref Range   HIV Screen 4th Generation wRfx Non Reactive Non Reactive    Comment:  (NOTE) Performed At: [facility] [place], [place] [id number] Rush Farmer MD [id number]   CBC     Status: Abnormal   Collection Time: 10/05/18  4:27 AM  Result Value Ref Range   WBC 6.9 4.0 - 10.5 K/uL   RBC 2.61 (L) 4.22 - 5.81 MIL/uL   Hemoglobin 9.6 (L) 13.0 - 17.0 g/dL   HCT 27.6 (L) 39.0 - 52.0 %   MCV 105.7 (H) 80.0 - 100.0 fL   MCH 36.8 (H) 26.0 - 34.0 pg   MCHC 34.8 30.0 - 36.0 g/dL   RDW 12.9 11.5 - 15.5 %   Platelets 61 (L) 150 - 400 K/uL    Comment: REPEATED TO VERIFY PLATELET COUNT CONFIRMED BY SMEAR Immature Platelet Fraction may be clinically indicated, consider ordering this additional test [id number] CONSISTENT WITH PREVIOUS RESULT    nRBC 0.0 0.0 - 0.2 %    Comment: Performed at [facility], [place] [address] [place], [place] [address]  Magnesium     Status: Abnormal   Collection Time: 10/05/18  4:27 AM  Result Value Ref Range   Magnesium 1.5 (L) 1.7 - 2.4 mg/dL    Comment: Performed at [facility] [address], [place], [place] [address]  Phosphorus     Status: None   Collection Time: 10/05/18  4:27 AM  Result Value Ref Range   Phosphorus 3.2 2.5 - 4.6 mg/dL    Comment: Performed at [facility] [address], [place],  [address]  Hepatic function panel     Status: Abnormal   Collection Time: 10/05/18  4:27 AM  Result Value Ref Range   Total Protein 5.9 (L) 6.5 - 8.1 g/dL   Albumin 2.7 (L) 3.5 - 5.0 g/dL   AST 64 (H) 15 - 41 U/L   ALT 35  0 - 44 U/L   Alkaline Phosphatase 59 38 - 126 U/L   Total Bilirubin 3.4 (H) 0.3 - 1.2 mg/dL   Bilirubin, Direct 1.2 (H) 0.0 - 0.2 mg/dL   Indirect Bilirubin 2.2 (H) 0.3 - 0.9 mg/dL    Comment: Performed at [facility] [address], [place], [place] [id number]  Basic metabolic panel     Status: Abnormal   Collection Time: 10/05/18  4:27 AM  Result Value Ref Range   Sodium 135 135 - 145 mmol/L   Potassium 3.2 (L) 3.5 - 5.1 mmol/L   Chloride 102 98 -  111 mmol/L   CO2 23 22 - 32 mmol/L   Glucose, Bld 117 (H) 70 - 99 mg/dL   BUN 17 6 - 20 mg/dL   Creatinine, Ser 0.88 0.61 - 1.24 mg/dL   Calcium 7.5 (L) 8.9 - 10.3 mg/dL   GFR calc non Af Amer >60 >60 mL/min   GFR calc Af Amer >60 >60 mL/min   Anion gap 10 5 - 15    Comment: Performed at [facility] [address] [place], [place] [address]  Prepare RBC     Status: None   Collection Time: 10/05/18  6:33 AM  Result Value Ref Range   Order Confirmation      ORDER PROCESSED BY BLOOD BANK Performed at [facility], [place] [address], [place], [place] [address]   CBC     Status: Abnormal   Collection Time: 10/05/18 11:37 AM  Result Value Ref Range   WBC 5.5 4.0 - 10.5 K/uL   RBC 2.58 (L) 4.22 - 5.81 MIL/uL   Hemoglobin 9.5 (L) 13.0 - 17.0 g/dL   HCT 27.7 (L) 39.0 - 52.0 %   MCV 107.4 (H) 80.0 - 100.0 fL   MCH 36.8 (H) 26.0 - 34.0 pg   MCHC 34.3 30.0 - 36.0 g/dL   RDW 13.0 11.5 - 15.5 %   Platelets 55 (L) 150 - 400 K/uL    Comment: REPEATED TO VERIFY Immature Platelet Fraction may be clinically indicated, consider ordering this additional test [id number] CONSISTENT WITH PREVIOUS RESULT    nRBC 0.0 0.0 - 0.2 %    Comment: Performed at [facility], [place] [address] [place], [place] [address]  CBC     Status: Abnormal   Collection Time: 10/05/18  5:15 PM  Result Value Ref Range   WBC 4.5 4.0 - 10.5 K/uL   RBC 2.60 (L) 4.22 - 5.81 MIL/uL   Hemoglobin 9.4 (L) 13.0 - 17.0 g/dL   HCT 28.1 (L) 39.0 - 52.0 %   MCV 108.1 (H) 80.0 - 100.0 fL   MCH 36.2 (H) 26.0 - 34.0 pg   MCHC 33.5 30.0 - 36.0 g/dL   RDW 12.9 11.5 - 15.5 %   Platelets 52 (L) 150 - 400 K/uL    Comment: REPEATED TO VERIFY Immature Platelet Fraction may be clinically indicated, consider ordering this additional test [id number] CONSISTENT WITH PREVIOUS RESULT    nRBC 0.0 0.0 - 0.2 %    Comment: Performed at [facility], [place] [address] [place],  [address]  MRSA PCR Screening      Status: None   Collection Time: 10/05/18  8:57 PM   Specimen: Nasal Mucosa; Nasopharyngeal  Result Value Ref Range   MRSA by PCR NEGATIVE NEGATIVE    Comment:        The GeneXpert MRSA Assay (FDA approved for NASAL  specimens only), is one component of a comprehensive MRSA colonization surveillance program. It is not intended to diagnose MRSA infection nor to guide or monitor treatment for MRSA infections. Performed at [facility], [place] [address] [place], [place] [address]   CBC with Differential/Platelet     Status: Abnormal   Collection Time: 10/06/18  4:51 AM  Result Value Ref Range   WBC 5.1 4.0 - 10.5 K/uL   RBC 2.57 (L) 4.22 - 5.81 MIL/uL   Hemoglobin 9.3 (L) 13.0 - 17.0 g/dL   HCT 27.3 (L) 39.0 - 52.0 %   MCV 106.2 (H) 80.0 - 100.0 fL   MCH 36.2 (H) 26.0 - 34.0 pg   MCHC 34.1 30.0 - 36.0 g/dL   RDW 12.5 11.5 - 15.5 %   Platelets 58 (L) 150 - 400 K/uL    Comment: REPEATED TO VERIFY Immature Platelet Fraction may be clinically indicated, consider ordering this additional test [id number] CONSISTENT WITH PREVIOUS RESULT    nRBC 0.0 0.0 - 0.2 %   Neutrophils Relative % 82 %   Neutro Abs 4.2 1.7 - 7.7 K/uL   Lymphocytes Relative 11 %   Lymphs Abs 0.5 (L) 0.7 - 4.0 K/uL   Monocytes Relative 6 %   Monocytes Absolute 0.3 0.1 - 1.0 K/uL   Eosinophils Relative 0 %   Eosinophils Absolute 0.0 0.0 - 0.5 K/uL   Basophils Relative 0 %   Basophils Absolute 0.0 0.0 - 0.1 K/uL   Immature Granulocytes 1 %   Abs Immature Granulocytes 0.03 0.00 - 0.07 K/uL    Comment: Performed at [facility], [place] [address] [place], [place] [id number]  Basic metabolic panel     Status: Abnormal   Collection Time: 10/06/18  4:51 AM  Result Value Ref Range   Sodium 136 135 - 145 mmol/L   Potassium 3.6 3.5 - 5.1 mmol/L   Chloride 105 98 - 111 mmol/L   CO2 21 (L) 22 - 32 mmol/L   Glucose, Bld 125 (H) 70 - 99 mg/dL   BUN 9 6 - 20 mg/dL   Creatinine, Ser 0.64 0.61 - 1.24 mg/dL    Calcium 7.6 (L) 8.9 - 10.3 mg/dL   GFR calc non Af Amer >60 >60 mL/min   GFR calc Af Amer >60 >60 mL/min   Anion gap 10 5 - 15    Comment: Performed at [facility], [place] [address], [place], [place] [address]  Magnesium     Status: None   Collection Time: 10/06/18  4:51 AM  Result Value Ref Range   Magnesium 1.8 1.7 - 2.4 mg/dL    Comment: Performed at [facility] [address], [place],  [address]    Studies/Results: Ct Abdomen Pelvis W Contrast  Result Date: 10/04/2018 CLINICAL DATA:  Hematemesis, right upper quadrant pain for 2 days, nausea and vomiting onset today with hematemesis EXAM: CT ABDOMEN AND PELVIS WITH CONTRAST TECHNIQUE: Multidetector CT imaging of the abdomen and pelvis was performed using the standard protocol following bolus administration of intravenous contrast. CONTRAST:  147mL OMNIPAQUE IOHEXOL 300 MG/ML  SOLN COMPARISON:  None. FINDINGS: Lower chest: Lung bases are clear. Normal heart size. No pericardial effusion. Hepatobiliary: There is a lobular surface contour of the liver with patchy heterogeneous and predominantly peripheral enhancement. Heterogeneity persist on delayed imaging. There is a 1.8 cm hypoattenuating structure seen adjacent the gallbladder fossa with some nodular discontinuous peripheral enhancement evident on the excretory phase delay  imaging suggestive of hepatic hemangioma. Surgical clip again along the falciform ligament. No gallbladder wall thickening. No biliary ductal dilatation or calcified gallstones. Pancreas: Fatty replacement of the pancreas. No pancreatic ductal dilatation or surrounding inflammatory changes. Spleen: Borderline splenomegaly. Perisplenic venous collateralization. Adrenals/Urinary Tract: Normal adrenal glands. Areas of cortical scarring. Kidneys are otherwise unremarkable, without renal calculi, suspicious lesion, or hydronephrosis. Bladder is unremarkable. Stomach/Bowel: There is mild thickening of the distal  esophagus and gastric cardia. Distal stomach and duodenal sweep are unremarkable. No small bowel dilatation or wall thickening. A normal appendix is visualized. Colon is largely decompressed at the time of exam. No colonic dilatation or wall thickening. Vascular/Lymphatic: Atherosclerotic plaque within the normal caliber aorta. Upper abdominal venous collateralization is noted. There is nonspecific stranding near the level of the diaphragmatic hiatus and about the gastric cardia with reactive adenopathy. No pathologically enlarged nodes. Reproductive: The prostate and seminal vesicles are unremarkable. Other: Small volume perihepatic ascites (3/17). Small amount of tracking towards the right paracolic gutter and likely developing left paracolic gutter as well. Additional hazy stranding near the gastric cardia, as above. No free extraperitoneal air. Postsurgical changes from prior ventral hernia repair Musculoskeletal: No acute osseous abnormality or suspicious osseous lesion. IMPRESSION: 1. Stigmata of cirrhosis with evidence of portal hypertension including splenomegaly and upper abdominal venous collateralization including paraesophageal and gastric varices/venous collaterals and trace ascites. 2. Thickening of the distal esophagus and gastric cardia with adjacent stranding and reactive adenopathy, suggestive of esophagitis. In the setting of hematemesis and cirrhotic features, may be further evaluated with endoscopy to assess for variceal bleeding. 3. Hypoattenuating lesion along the gallbladder fossa in the anterior right lobe liver, features suggest hepatic hemangioma though incompletely evaluated on this exam. Recommend dedicated hepatic MRI in the nonacute setting given underlying cirrhosis. 4. Aortic atherosclerosis (ICD10-I70.0). 5. Prior ventral hernia repair. Electronically Signed   By: Lovena Le M.D.   On: 10/04/2018 22:32    Medications: I have reviewed the patient's current  medications.  Assessment: Hematemesis likely secondary to portal hypertension, underlying cirrhotic changes of cirrhosis on CAT scan, evidence of paraesophageal and gastric varices/venous collaterals, with trace ascites noted  Hepatic hemangioma Hemoglobin is remained stable at 9.3  Elevated MCV of one 6.2 likely related to alcohol abuse BUN trending down from 17 to 9 Elevated T bili, with AST ALT ratio of 2 is 2 1 compatible with alcoholic hepatitis-no evidence of encephalopathy/asterixis  Plan: Advance diet to regular Continue IV octreotide and IV Protonix today Start propanolol 10 mg twice daily in a.m. Continue IV ceftriaxone Continue folic acid, multivitamin and thiamine If Hb remains stable, okay to discharge tomorrow in a.m. to follow-up with Dr. [name] as an outpatient for management of cirrhosis.   Ronnette Juniper 10/06/2018, 10:47 AM   Pager [phone] If no answer or after 5 PM call [phone]

## 2018-10-06 NOTE — Progress Notes (Signed)
PROGRESS NOTE  Joshua Hubbard  DOB: 1975/09/23  PCP: [name], MD SJG:[id number]  DOA: 10/04/2018  LOS: 1 day   Brief narrative: Joshua Hubbard is a 43 y.o. male with PMH of chronic alcoholism who presented to the ED on 10/04/2018 with abdominal pain and hematemesis. Patient states that he has been drinking alcohol heavily for several years. Over time he did cut down on his drinking but at present continues to drink about 3-6 drinks per day which could either be beer or wine. States his last drink was 3 days prior to presentation On 9/10, he had a episode of large-volume hematemesis, also associated with right upper quadrant abdominal pain. Denies history of prior GI bleed. States he was told of having liver cirrhosis 1 year ago.  He however did not take it seriously at that time.  In the ED, patient had normal blood pressure, tachycardic to 654Y, had alcoholic tremors.  WBC count 14.6, hemoglobin 12.3 a drop from 16.7 six months ago. Platelet count 90,000, was normal on labs done 6 months ago. Sodium 133, potassium 2.7. BUN 13, creatinine 0.5. AST 82, T bili 2.8. ALT and alk phos normal. Lipase normal.  CT abdomen pelvis showed stigmata of cirrhosis with evidence of portal hypertension including splenomegaly and upper abdominal venous collateralization including paraesophageal and gastric varices/venous collaterals and trace ascites. Thickening of the distal esophagus and gastric cardia with adjacent stranding and reactive adenopathy suggestive of esophagitis.   In the ED, patient was started on PPI and octreotide drip.  CIWA protocol was started.  Electrolyte replaced.  IV Rocephin was started.  While in the ED, he had approximately 300cc dark red blood.   Patient was admitted under hospitalist medicine service.  GI consultation was called.  9/11 EGD was significant portal gastropathy, but, surprisingly, there was absence of any significant varices.  Subjective: Patient was seen  and examined this morning.  [place] Caucasian male.  Not in distress.  No repeat episode of vomiting since yesterday.  Labs from this morning noted.  Hemoglobin stable at 9.3.  Remains on PPI and octreotide drip.  N.p.o. except for ice chips.  Assessment/Plan: Acute GI bleeding in the setting of alcoholic liver cirrhosis Acute blood loss anemia Hemoglobin was 16.7six months ago.Patient had one episode of hematemesis at home. Hemoglobin 12.3 on initial labs done in the ED. He then had an episode of hematemesis in the ED, approximately 300 cc dark red blood. Repeat hemoglobin 9.8. Persistently tachycardic. Blood pressure stable.  -Underwent EGD 9/11.  Surprisingly no evidence of bleeding varices.  Portal gastropathy noted.  GI recommended continuation PPI infusion and octreotide infusion for next 1 to 2 days. -Remains n.p.o. other than ice chips. -Hemoglobin stable this morning, 9.3.  Defer to GI for diet advancement.  Chronic alcoholism Alcohol withdrawal symptoms Patient drinks heavily on a regular basis. Last drink was 3 days prior to presentation.  Currently on CIWA protocol with as needed IV Ativan.  Has mild tremors this morning.  -Continue thiamine, folate, multivitamin  Alcoholic cirrhosis Total bili elevated to 3.4 on presentation.  Other enzymes mostly normal. -Continue to monitor LFTs -Check PT/INR -GI following  Hypokalemia/hypomagnesemia -At presentation potassium 2.7. Magnesium 1.5.  Replacement given. -This morning potassium is 3.6 and magnesium is 1.8. -Continue cardiac monitoring  Thrombocytopenia -Likely related to alcohol abuse/ liver cirrhosis. Most recent platelet count 61,000. -Continue to monitor closely  Liver lesion CT showing hypoattenuating lesion along the gallbladder fossa in the anterior right lobe  liver, features suggest hepatic hemangioma though incompletely evaluated on this exam.-Recommend dedicated hepatic MRI in the nonacute setting given  underlying cirrhosis.  Mobility: Encourage ambulation Diet: N.p.o. except ice chips. DVT prophylaxis: SCDs Code Status:  Code Status: Full Code  Family Communication:I called and updated patient's wife Ms. Christina Waldrop this morning. Expected Discharge: Inpatient hospitalization for at least next 2-3 days  Consultants:  GI  Procedures:  911, EGD  Antimicrobials: Anti-infectives (From admission, onward)   Start     Dose/Rate Route Frequency Ordered Stop   10/05/18 2200  cefTRIAXone (ROCEPHIN) 2 g in sodium chloride 0.9 % 100 mL IVPB     2 g 200 mL/hr over 30 Minutes Intravenous Every 24 hours 10/05/18 0346     10/04/18 2300  cefTRIAXone (ROCEPHIN) 1 g in sodium chloride 0.9 % 100 mL IVPB     1 g 200 mL/hr over 30 Minutes Intravenous  Once 10/04/18 2300 10/05/18 0247      Infusions:  . cefTRIAXone (ROCEPHIN)  IV 2 g (10/05/18 2146)  . octreotide  (SANDOSTATIN)    IV infusion 50 mcg/hr (10/06/18 0931)  . pantoprozole (PROTONIX) infusion 8 mg/hr (10/06/18 0356)    Scheduled Meds: . sodium chloride   Intravenous Once  . folic acid  1 mg Oral Daily  . multivitamin with minerals  1 tablet Oral Daily  . sodium chloride flush  3 mL Intravenous Once  . thiamine  100 mg Oral Daily   Or  . thiamine  100 mg Intravenous Daily    PRN meds: LORazepam **OR** LORazepam, ondansetron (ZOFRAN) IV   Objective: Vitals:   10/06/18 0411 10/06/18 0800  BP: 111/71 111/73  Pulse: 87 83  Resp: (!) 23   Temp:  98.2 F (36.8 C)  SpO2: 97%     Intake/Output Summary (Last 24 hours) at 10/06/2018 1016 Last data filed at 10/06/2018 0900 Gross per 24 hour  Intake 1100 ml  Output 1150 ml  Net -50 ml   Filed Weights   10/05/18 0850  Weight: 79.4 kg   Weight change:  Body mass index is 25.84 kg/m.   Physical Exam: General exam: Appears calm and comfortable.  Mild alcohol tremors Skin: No rashes, lesions or ulcers. HEENT: Atraumatic, normocephalic, supple neck, no obvious  bleeding Lungs: Clear to auscultation bilaterally CVS: Regular rate and rhythm, no murmur GI/Abd soft, nondistended, mild epigastric tenderness, bowel sound present CNS: Alert, awake, oriented x3 Psychiatry: Mood appropriate, judgment and insight clear. Extremities: No pedal edema, no calf tenderness  Data Review: I have personally reviewed the laboratory data and studies available.  Recent Labs  Lab 10/04/18 1712 10/05/18 0310 10/05/18 0427 10/05/18 1137 10/05/18 1715 10/06/18 0451  WBC 14.6*  --  6.9 5.5 4.5 5.1  NEUTROABS  --   --   --   --   --  4.2  HGB 12.3* 9.8* 9.6* 9.5* 9.4* 9.3*  HCT 34.9* 28.0* 27.6* 27.7* 28.1* 27.3*  MCV 103.9*  --  105.7* 107.4* 108.1* 106.2*  PLT 90*  --  61* 55* 52* 58*   Recent Labs  Lab 10/04/18 1712 10/05/18 0427 10/06/18 0451  NA 133* 135 136  K 2.7* 3.2* 3.6  CL 93* 102 105  CO2 26 23 21*  GLUCOSE 110* 117* 125*  BUN 13 17 9   CREATININE 0.57* 0.88 0.64  CALCIUM 8.8* 7.5* 7.6*  MG  --  1.5* 1.8  PHOS  --  3.2  --    Recent Labs  Lab 10/04/18  1712 10/05/18 0427  AST 82* 64*  ALT 40 35  ALKPHOS 80 59  BILITOT 2.8* 3.4*  PROT 7.4 5.9*  ALBUMIN 3.3* 2.7*    Terrilee Croak, MD  [facility] 10/06/2018

## 2018-10-06 NOTE — Plan of Care (Signed)
Pt understands and agrees with current POC.  

## 2018-10-07 ENCOUNTER — Encounter (HOSPITAL_COMMUNITY): Payer: Self-pay | Admitting: Gastroenterology

## 2018-10-07 LAB — CBC WITH DIFFERENTIAL/PLATELET
Abs Immature Granulocytes: 0.05 10*3/uL (ref 0.00–0.07)
Basophils Absolute: 0.1 10*3/uL (ref 0.0–0.1)
Basophils Relative: 1 %
Eosinophils Absolute: 0.2 10*3/uL (ref 0.0–0.5)
Eosinophils Relative: 2 %
HCT: 27.4 % — ABNORMAL LOW (ref 39.0–52.0)
Hemoglobin: 9.3 g/dL — ABNORMAL LOW (ref 13.0–17.0)
Immature Granulocytes: 1 %
Lymphocytes Relative: 20 %
Lymphs Abs: 1.3 10*3/uL (ref 0.7–4.0)
MCH: 36.3 pg — ABNORMAL HIGH (ref 26.0–34.0)
MCHC: 33.9 g/dL (ref 30.0–36.0)
MCV: 107 fL — ABNORMAL HIGH (ref 80.0–100.0)
Monocytes Absolute: 0.5 10*3/uL (ref 0.1–1.0)
Monocytes Relative: 7 %
Neutro Abs: 4.5 10*3/uL (ref 1.7–7.7)
Neutrophils Relative %: 69 %
Platelets: 61 10*3/uL — ABNORMAL LOW (ref 150–400)
RBC: 2.56 MIL/uL — ABNORMAL LOW (ref 4.22–5.81)
RDW: 12.6 % (ref 11.5–15.5)
WBC: 6.6 10*3/uL (ref 4.0–10.5)
nRBC: 0 % (ref 0.0–0.2)

## 2018-10-07 LAB — BASIC METABOLIC PANEL
Anion gap: 8 (ref 5–15)
BUN: 8 mg/dL (ref 6–20)
CO2: 23 mmol/L (ref 22–32)
Calcium: 7.6 mg/dL — ABNORMAL LOW (ref 8.9–10.3)
Chloride: 106 mmol/L (ref 98–111)
Creatinine, Ser: 0.66 mg/dL (ref 0.61–1.24)
GFR calc Af Amer: >60 mL/min (ref >60)
GFR calc non Af Amer: >60 mL/min (ref >60)
Glucose, Bld: 95 mg/dL (ref 70–99)
Potassium: 3.5 mmol/L (ref 3.5–5.1)
Sodium: 137 mmol/L (ref 135–145)

## 2018-10-07 MED ORDER — FOLIC ACID 1 MG PO TABS: 1.0000 mg | ORAL_TABLET | Freq: Every day | ORAL | 0 refills | Status: AC

## 2018-10-07 MED ORDER — PANTOPRAZOLE SODIUM 40 MG PO TBEC: 40.0000 mg | DELAYED_RELEASE_TABLET | Freq: Two times a day (BID) | ORAL | 0 refills | Status: DC

## 2018-10-07 MED ORDER — THIAMINE HCL 100 MG PO TABS: 100.0000 mg | ORAL_TABLET | Freq: Every day | ORAL | 0 refills | Status: DC

## 2018-10-07 MED ORDER — PROPRANOLOL HCL 10 MG PO TABS: 10.0000 mg | ORAL_TABLET | Freq: Two times a day (BID) | ORAL | 0 refills | Status: DC

## 2018-10-07 NOTE — Plan of Care (Signed)
  Problem: Education: Goal: Knowledge of General Education information will improve Description: Including pain rating scale, medication(s)/side effects and non-pharmacologic comfort measures Outcome: Completed/Met   Problem: Health Behavior/Discharge Planning: Goal: Ability to manage health-related needs will improve Outcome: Completed/Met   Problem: Clinical Measurements: Goal: Ability to maintain clinical measurements within normal limits will improve Outcome: Completed/Met Goal: Will remain free from infection Outcome: Completed/Met Goal: Diagnostic test results will improve Outcome: Completed/Met Goal: Respiratory complications will improve Outcome: Completed/Met Goal: Cardiovascular complication will be avoided Outcome: Completed/Met   Problem: Activity: Goal: Risk for activity intolerance will decrease Outcome: Completed/Met   Problem: Coping: Goal: Level of anxiety will decrease Outcome: Completed/Met   Problem: Elimination: Goal: Will not experience complications related to bowel motility Outcome: Completed/Met Goal: Will not experience complications related to urinary retention Outcome: Completed/Met   Problem: Pain Managment: Goal: General experience of comfort will improve Outcome: Completed/Met   Problem: Safety: Goal: Ability to remain free from injury will improve Outcome: Completed/Met   Problem: Education: Goal: Ability to identify signs and symptoms of gastrointestinal bleeding will improve Outcome: Completed/Met   Problem: Bowel/Gastric: Goal: Will show no signs and symptoms of gastrointestinal bleeding Outcome: Completed/Met   Problem: Fluid Volume: Goal: Will show no signs and symptoms of excessive bleeding Outcome: Completed/Met   Problem: Clinical Measurements: Goal: Complications related to the disease process, condition or treatment will be avoided or minimized Outcome: Completed/Met

## 2018-10-07 NOTE — Discharge Summary (Signed)
Physician Discharge Summary  Joshua Hubbard BVQ:[id number] DOB: 09/26/1975 DOA: 10/04/2018  PCP: [name], MD  Admit date: 10/04/2018 Discharge date: 10/07/2018  Admitted From: Home Discharge disposition: Home   Code Status: Full Code   Recommendations for Outpatient Follow-Up:   1. Follow-up with GI as an outpatient 2. Stop alcohol  Discharge Diagnosis:   Principal Problem:   GI bleed Active Problems:   Liver cirrhosis (HCC)   Alcohol withdrawal (HCC)   Hypokalemia   Thrombocytopenia (HCC)   History of Present Illness / Brief narrative:  Joshua Hubbard is a 43 y.o. male with PMH of chronic alcoholism who presented to the ED on 10/04/2018 with abdominal pain and hematemesis. Patient states that he has been drinking alcohol heavily for several years. Over time he did cut down on his drinking but at present continues to drink about 3-6 drinks per day which could either be beer or wine. States his last drink was 3 days prior to presentation On 9/10,he had a episode of large-volume hematemesis, also associated with right upper quadrant abdominal pain. Denies history of prior GI bleed. States hewas told of having liver cirrhosis 1 year ago. He however did not take it seriously at that time. In the ED, patient had normal blood pressure, tachycardic to 800L, had alcoholic tremors.  WBC count 14.6, hemoglobin 12.3 a drop from 16.7 six months ago. Platelet count 90,000, was normal on labs done 6 months ago. Sodium 133, potassium 2.7. BUN 13, creatinine 0.5. AST 82, T bili 2.8. ALT and alk phos normal. Lipase normal.  CT abdomen pelvisshowedstigmata of cirrhosis with evidence of portal hypertension including splenomegaly and upper abdominal venous collateralization including paraesophageal and gastric varices/venous collaterals and trace ascites. Thickening of the distal esophagus and gastric cardia with adjacent stranding and reactive adenopathy suggestive of esophagitis.    In the ED, patient was started on PPI and octreotide drip.  CIWA protocol was started.  Electrolyte replaced.  IV Rocephin was started.  While in the ED, he had approximately 300cc dark red blood.   Patient was admitted under hospitalist medicine service. GI consultation was called.  9/11 EGD was significant portal gastropathy, but, surprisingly, there was absence of any significant varices.  Subjective:  Seen and examined this morning.  Lying down in bed.  Not in distress.  No alcohol tremors.  No further episode of bleeding since arrival.  Hemoglobin stable.  Wants to go home this morning.  Hospital Course:  AcuteGIbleeding in the setting of alcoholic liver cirrhosis Acute blood loss anemia Hemoglobin was 16.7six months ago.Patient had one episode of hematemesis at home. Hemoglobin 12.3 on initial labs done in the ED. He then had an episode of hematemesis in the ED, approximately 300 cc dark red blood. Repeat hemoglobin 9.8. Persistently tachycardic. Blood pressure stable.  -Underwent EGD 9/11. Surprisingly no evidence of bleeding varices. Portal gastropathy noted.  Patient has been maintained on PPI drip and octreotide drip for next 48 hours.  This morning, patient feels improved.  Drips stopped. Switch to oral Protonix.  Tolerated regular diet.   -Hemoglobin stable this morning  -We will discharge the patient home on oral propanolol and oral Protonix.  Chronic alcoholism Alcohol withdrawal symptoms Patient drinks heavily on a regular basis. Last drink was 3 days prior to presentation.  Managed with CIWA protocol with as needed IV Ativan. -Continue thiamine, folate, multivitamin  Alcoholic cirrhosis Total bili elevated to 3.4 on presentation.  Other enzymes mostly normal. -LFTs mildly elevated  and stable -consistent with alcoholic liver disease -Minimal ascites only.  No encephalopathy.  Oral propanolol started.  Not started on Lasix, Aldactone or lactulose at this  time. -Needs to follow-up with GI as an outpatient regular basis. -Strongly encouraged to stop alcohol.  Hypokalemia/hypomagnesemia Improved with replacement.  Thrombocytopenia -Likely related to alcohol abuse/ liver cirrhosis. Stable.    Liver lesion CT showing hypoattenuating lesion along the gallbladder fossa in the anterior right lobe liver, features suggest hepatic hemangioma though incompletely evaluated on this exam.-Recommend dedicated hepatic MRI in the nonacute setting given underlying cirrhosis. To follow-up with GI as an outpatient.  Discharge Exam:   Vitals:   10/07/18 0310 10/07/18 0318 10/07/18 0500 10/07/18 0816  BP: 97/64   106/72  Pulse: 89   91  Resp: 15  16 19   Temp:  98.6 F (37 C)  99.2 F (37.3 C)  TempSrc:  Oral  Oral  SpO2: 98%   96%  Weight:   80.7 kg   Height:        Body mass index is 26.27 kg/m.  General exam: Appears calm and comfortable.  Skin: No rashes, lesions or ulcers. HEENT: Atraumatic, normocephalic, supple neck, no obvious bleeding Lungs: Clear to auscultation bilaterally CVS: Regular rate and rhythm, no murmur GI/Abd soft, nondistended, nontender, bowel sound present CNS: Alert, awake oriented x3, no alcohol-related tremors this morning. Psychiatry: Mood appropriate Extremities: No pedal edema, no calf tenderness  Discharge Instructions:  Wound care: None Discharge Instructions    Diet - low sodium heart healthy   Complete by: As directed    Increase activity slowly   Complete by: As directed    Other Restrictions   Complete by: As directed    Stop alcohol      Allergies as of 10/07/2018   No Known Allergies     Medication List    TAKE these medications   clonazePAM 1 MG tablet Commonly known as: KLONOPIN TAKE 1 TABLET AT BEDTIME   folic acid 1 MG tablet Commonly known as: FOLVITE Take 1 tablet (1 mg total) by mouth daily.   multivitamin with minerals Tabs tablet Take 1 tablet by mouth daily.    pantoprazole 40 MG tablet Commonly known as: Protonix Take 1 tablet (40 mg total) by mouth 2 (two) times daily.   propranolol 10 MG tablet Commonly known as: INDERAL Take 1 tablet (10 mg total) by mouth 2 (two) times daily.   thiamine 100 MG tablet Take 1 tablet (100 mg total) by mouth daily.       Time coordinating discharge: 35 minutes  The results of significant diagnostics from this hospitalization (including imaging, microbiology, ancillary and laboratory) are listed below for reference.    Procedures and Diagnostic Studies:   Ct Abdomen Pelvis W Contrast  Result Date: 10/04/2018 CLINICAL DATA:  Hematemesis, right upper quadrant pain for 2 days, nausea and vomiting onset today with hematemesis EXAM: CT ABDOMEN AND PELVIS WITH CONTRAST TECHNIQUE: Multidetector CT imaging of the abdomen and pelvis was performed using the standard protocol following bolus administration of intravenous contrast. CONTRAST:  168m OMNIPAQUE IOHEXOL 300 MG/ML  SOLN COMPARISON:  None. FINDINGS: Lower chest: Lung bases are clear. Normal heart size. No pericardial effusion. Hepatobiliary: There is a lobular surface contour of the liver with patchy heterogeneous and predominantly peripheral enhancement. Heterogeneity persist on delayed imaging. There is a 1.8 cm hypoattenuating structure seen adjacent the gallbladder fossa with some nodular discontinuous peripheral enhancement evident on the excretory phase delay imaging suggestive  of hepatic hemangioma. Surgical clip again along the falciform ligament. No gallbladder wall thickening. No biliary ductal dilatation or calcified gallstones. Pancreas: Fatty replacement of the pancreas. No pancreatic ductal dilatation or surrounding inflammatory changes. Spleen: Borderline splenomegaly. Perisplenic venous collateralization. Adrenals/Urinary Tract: Normal adrenal glands. Areas of cortical scarring. Kidneys are otherwise unremarkable, without renal calculi, suspicious  lesion, or hydronephrosis. Bladder is unremarkable. Stomach/Bowel: There is mild thickening of the distal esophagus and gastric cardia. Distal stomach and duodenal sweep are unremarkable. No small bowel dilatation or wall thickening. A normal appendix is visualized. Colon is largely decompressed at the time of exam. No colonic dilatation or wall thickening. Vascular/Lymphatic: Atherosclerotic plaque within the normal caliber aorta. Upper abdominal venous collateralization is noted. There is nonspecific stranding near the level of the diaphragmatic hiatus and about the gastric cardia with reactive adenopathy. No pathologically enlarged nodes. Reproductive: The prostate and seminal vesicles are unremarkable. Other: Small volume perihepatic ascites (3/17). Small amount of tracking towards the right paracolic gutter and likely developing left paracolic gutter as well. Additional hazy stranding near the gastric cardia, as above. No free extraperitoneal air. Postsurgical changes from prior ventral hernia repair Musculoskeletal: No acute osseous abnormality or suspicious osseous lesion. IMPRESSION: 1. Stigmata of cirrhosis with evidence of portal hypertension including splenomegaly and upper abdominal venous collateralization including paraesophageal and gastric varices/venous collaterals and trace ascites. 2. Thickening of the distal esophagus and gastric cardia with adjacent stranding and reactive adenopathy, suggestive of esophagitis. In the setting of hematemesis and cirrhotic features, may be further evaluated with endoscopy to assess for variceal bleeding. 3. Hypoattenuating lesion along the gallbladder fossa in the anterior right lobe liver, features suggest hepatic hemangioma though incompletely evaluated on this exam. Recommend dedicated hepatic MRI in the nonacute setting given underlying cirrhosis. 4. Aortic atherosclerosis (ICD10-I70.0). 5. Prior ventral hernia repair. Electronically Signed   By: Lovena Le  M.D.   On: 10/04/2018 22:32     Labs:   Basic Metabolic Panel: Recent Labs  Lab 10/04/18 1712 10/05/18 0427 10/06/18 0451 10/07/18 0217  NA 133* 135 136 137  K 2.7* 3.2* 3.6 3.5  CL 93* 102 105 106  CO2 26 23 21* 23  GLUCOSE 110* 117* 125* 95  BUN 13 17 9 8   CREATININE 0.57* 0.88 0.64 0.66  CALCIUM 8.8* 7.5* 7.6* 7.6*  MG  --  1.5* 1.8  --   PHOS  --  3.2  --   --    GFR Estimated Creatinine Clearance: 117.8 mL/min (by C-G formula based on SCr of 0.66 mg/dL). Liver Function Tests: Recent Labs  Lab 10/04/18 1712 10/05/18 0427  AST 82* 64*  ALT 40 35  ALKPHOS 80 59  BILITOT 2.8* 3.4*  PROT 7.4 5.9*  ALBUMIN 3.3* 2.7*   Recent Labs  Lab 10/04/18 1712  LIPASE 41   No results for input(s): AMMONIA in the last 168 hours. Coagulation profile No results for input(s): INR, PROTIME in the last 168 hours.  CBC: Recent Labs  Lab 10/05/18 0427 10/05/18 1137 10/05/18 1715 10/06/18 0451 10/07/18 0217  WBC 6.9 5.5 4.5 5.1 6.6  NEUTROABS  --   --   --  4.2 4.5  HGB 9.6* 9.5* 9.4* 9.3* 9.3*  HCT 27.6* 27.7* 28.1* 27.3* 27.4*  MCV 105.7* 107.4* 108.1* 106.2* 107.0*  PLT 61* 55* 52* 58* 61*   Cardiac Enzymes: No results for input(s): CKTOTAL, CKMB, CKMBINDEX, TROPONINI in the last 168 hours. BNP: Invalid input(s): POCBNP CBG: No results for input(s): GLUCAP  in the last 168 hours. D-Dimer No results for input(s): DDIMER in the last 72 hours. Hgb A1c No results for input(s): HGBA1C in the last 72 hours. Lipid Profile No results for input(s): CHOL, HDL, LDLCALC, TRIG, CHOLHDL, LDLDIRECT in the last 72 hours. Thyroid function studies No results for input(s): TSH, T4TOTAL, T3FREE, THYROIDAB in the last 72 hours.  Invalid input(s): FREET3 Anemia work up No results for input(s): VITAMINB12, FOLATE, FERRITIN, TIBC, IRON, RETICCTPCT in the last 72 hours. Microbiology Recent Results (from the past 240 hour(s))  SARS Coronavirus 2 [facility] order, Performed in [facility] hospital lab) Nasopharyngeal Nasopharyngeal Swab     Status: None   Collection Time: 10/05/18  1:14 AM   Specimen: Nasopharyngeal Swab  Result Value Ref Range Status   SARS Coronavirus 2 NEGATIVE NEGATIVE Final    Comment: (NOTE) If result is NEGATIVE SARS-CoV-2 target nucleic acids are NOT DETECTED. The SARS-CoV-2 RNA is generally detectable in upper and lower  respiratory specimens during the acute phase of infection. The lowest  concentration of SARS-CoV-2 viral copies this assay can detect is 250  copies / mL. A negative result does not preclude SARS-CoV-2 infection  and should not be used as the sole basis for treatment or other  patient management decisions.  A negative result may occur with  improper specimen collection / handling, submission of specimen other  than nasopharyngeal swab, presence of viral mutation(s) within the  areas targeted by this assay, and inadequate number of viral copies  (<250 copies / mL). A negative result must be combined with clinical  observations, patient history, and epidemiological information. If result is POSITIVE SARS-CoV-2 target nucleic acids are DETECTED. The SARS-CoV-2 RNA is generally detectable in upper and lower  respiratory specimens dur ing the acute phase of infection.  Positive  results are indicative of active infection with SARS-CoV-2.  Clinical  correlation with patient history and other diagnostic information is  necessary to determine patient infection status.  Positive results do  not rule out bacterial infection or co-infection with other viruses. If result is PRESUMPTIVE POSTIVE SARS-CoV-2 nucleic acids MAY BE PRESENT.   A presumptive positive result was obtained on the submitted specimen  and confirmed on repeat testing.  While 2019 novel coronavirus  (SARS-CoV-2) nucleic acids may be present in the submitted sample  additional confirmatory testing may be necessary for epidemiological  and / or clinical management  purposes  to differentiate between  SARS-CoV-2 and other Sarbecovirus currently known to infect humans.  If clinically indicated additional testing with an alternate test  methodology [phone]) is advised. The SARS-CoV-2 RNA is generally  detectable in upper and lower respiratory sp ecimens during the acute  phase of infection. The expected result is Negative. Fact Sheet for Patients:  StrictlyIdeas.no Fact Sheet for Healthcare Providers: BankingDealers.co.za This test is not yet approved or cleared by the [place] FDA and has been authorized for detection and/or diagnosis of SARS-CoV-2 by FDA under an Emergency Use Authorization (EUA).  This EUA will remain in effect (meaning this test can be used) for the duration of the COVID-19 declaration under Section 564(b)(1) of the Act, 21 U.S.C. section 360bbb-3(b)(1), unless the authorization is terminated or revoked sooner. Performed at [facility], [place] [address] [place], [place] [address]   MRSA PCR Screening     Status: None   Collection Time: 10/05/18  8:57 PM   Specimen: Nasal Mucosa; Nasopharyngeal  Result Value Ref Range Status   MRSA by PCR  NEGATIVE NEGATIVE Final    Comment:        The GeneXpert MRSA Assay (FDA approved for NASAL specimens only), is one component of a comprehensive MRSA colonization surveillance program. It is not intended to diagnose MRSA infection nor to guide or monitor treatment for MRSA infections. Performed at [facility], [place] [address], [place], [place] [address]     Signed: Terrilee Croak  [facility] 10/07/2018, 10:26 AM

## 2018-10-07 NOTE — Progress Notes (Signed)
Patient standing in hallway, fully dressed, IV lines intact, and states he is going to leave because "the doctor said I could".  There was no discharge order as of that time, but MD did write one and I was able to convince patient to stay until I could give him his discharge instructions.  These included, but were not limited to, the following, follow-up appointments, recommendations to stop drinking, support recommendations, hotline numbers, when to call the MD, education re: cirrhosis, alcoholism, etc.  Patient discharged to home via private vehicle and picked up by wife.  Ambulated to exit on own as per his request at 1025.

## 2018-10-07 NOTE — Progress Notes (Signed)
Patient demanding to see MD.  States he wants to leave AMA.  States is not willing to continue treatment if it means staying in the hospital.  Patient appears agitated / pacing and not wanting to keep monitor on.  Diaphoretic / cool to touch.  Does not want Ativan.  MD to see patient later in am.

## 2018-10-08 ENCOUNTER — Encounter: Payer: Self-pay | Admitting: Family Medicine

## 2018-10-08 DIAGNOSIS — F1019 Alcohol abuse with unspecified alcohol-induced disorder: Secondary | ICD-10-CM | POA: Insufficient documentation

## 2018-10-08 DIAGNOSIS — F1011 Alcohol abuse, in remission: Secondary | ICD-10-CM | POA: Insufficient documentation

## 2018-10-08 LAB — TYPE AND SCREEN
ABO/RH(D): AB POS
Antibody Screen: NEGATIVE
Unit division: 0
Unit division: 0

## 2018-10-08 LAB — BPAM RBC
Blood Product Expiration Date: 202009262359
Blood Product Expiration Date: 202010022359
ISSUE DATE / TIME: 202009082009
ISSUE DATE / TIME: 202009100746
Unit Type and Rh: 6200
Unit Type and Rh: 6200

## 2018-10-08 NOTE — Anesthesia Postprocedure Evaluation (Signed)
Anesthesia Post Note  Patient: Joshua Hubbard  Procedure(s) Performed: ESOPHAGOGASTRODUODENOSCOPY (EGD) WITH PROPOFOL (N/A ) BIOPSY     Patient location during evaluation: PACU Anesthesia Type: General Level of consciousness: awake and alert, oriented and patient cooperative Pain management: pain level controlled Vital Signs Assessment: post-procedure vital signs reviewed and stable Respiratory status: spontaneous breathing, nonlabored ventilation and respiratory function stable Cardiovascular status: blood pressure returned to baseline and stable Postop Assessment: no apparent nausea or vomiting Anesthetic complications: no    Last Vitals:  Vitals:   10/07/18 0500 10/07/18 0816  BP:  106/72  Pulse:  91  Resp: 16 19  Temp:  37.3 C  SpO2:  96%    Last Pain:  Vitals:   10/07/18 0816  TempSrc: Oral  PainSc: 0-No pain                 [name]

## 2018-10-08 NOTE — Addendum Note (Signed)
Addendum  created 10/08/18 1854 by [name], DO   Clinical Note Signed

## 2018-10-09 ENCOUNTER — Other Ambulatory Visit: Payer: Self-pay | Admitting: *Deleted

## 2018-10-09 NOTE — Patient Outreach (Signed)
Joshua Hubbard) Care Management  10/09/2018  Joshua Hubbard 08/17/1975 [id number]  Transition of care telephone call  Referral received: 10/08/18 Initial outreach: 10/09/18 Insurance: [place]  Initial unsuccessful telephone call to patient's preferred number in order to complete transition of care assessment; no answer, left HIPAA compliant voicemail message requesting return call.   Objective: Per the electronic medical record, Joshua Hubbard was hospitalized at [facility] from 9/11-9/13/20 with GI bleed related to ETOH abuse. Comorbidities include: liver cirrhosis, arthritis, meniscus and anterior cruciate ligament  tear of left knee, bilateral tinnitus, thrombocytopenia, closed dislocation of tarsal joint He was discharged to home on 10/07/18 without the need for home health services or durable medical equipment per the discharge summary.   Plan: This RNCM will route unsuccessful outreach letter with [place] Management pamphlet and 24 hour Nurse Advice Line Magnet to [place] [employer] clinical pool to be mailed to patient's home address. This RNCM will attempt another outreach within 4 business days.  Barrington Ellison RN,CCM,CDE [place] Management Coordinator Office Phone [phone] Office Fax [phone]

## 2018-10-10 DIAGNOSIS — K7030 Alcoholic cirrhosis of liver without ascites: Secondary | ICD-10-CM | POA: Diagnosis not present

## 2018-10-10 DIAGNOSIS — D62 Acute posthemorrhagic anemia: Secondary | ICD-10-CM | POA: Diagnosis not present

## 2018-10-10 DIAGNOSIS — K7010 Alcoholic hepatitis without ascites: Secondary | ICD-10-CM | POA: Diagnosis not present

## 2018-10-12 ENCOUNTER — Other Ambulatory Visit: Payer: Self-pay | Admitting: *Deleted

## 2018-10-12 ENCOUNTER — Ambulatory Visit: Payer: 59 | Admitting: Family Medicine

## 2018-10-12 ENCOUNTER — Encounter: Payer: Self-pay | Admitting: *Deleted

## 2018-10-12 MED FILL — clonazePAM 1 MG TABS: 1 | 90 days supply | Qty: 90 | Fill #0

## 2018-10-12 NOTE — Patient Outreach (Addendum)
[facility]) Care Management  10/12/2018  Jasiel Swann Dethlefs [birth year] [id number]  Transition of care call/case closure    Referral received: 10/08/18 Initial outreach: 10/09/18 Insurance: [employer] Health Save Plan  Subjective: Second attempt to reach patient successful; 2 HIPAA identifiers verified. Explained purpose of call and completed transition of care assessment.  [name] states he is doing well, says he follow up with a gastroenterologist on 9/16 and will see him again in 5 weeks. He is agreeable to receiving information on the [place] Program to assist him with ETOH abuse.  He denies any other ongoing health issues and says he does not need a referral to one of the [place] chronic disease management programs.  He says he does not think he purchase the hospital indemnity insurance. He says he uses the [facility] outpatient pharmacy on [address].  He denies educational needs related to staying safe during the COVID 19 pandemic.    Objective:  Per the electronic medical record, Mr. Medcalf was hospitalized at [facility] from 9/11-9/13/20 with GI bleed related to ETOH abuse. He was found to have persistent tachycardia while hospitalized. Comorbidities include: liver cirrhosis, arthritis, meniscus and anterior cruciate ligament  tear of left knee, bilateral tinnitus, thrombocytopenia, closed dislocation of tarsal joint He was discharged to home on 10/07/18 without the need for home health services or durable medical equipment per the discharge summary.    Assessment:  Patient voices good understanding of all discharge instructions except for low sodium heart healthy diet and employee counseling services. .  See transition of care flowsheet for assessment details.   Plan:  Reviewed hospital discharge diagnosis of GI bleed related to ETOH abuse and treatment plan using hospital discharge instructions, assessing medication adherence,  reviewing problems requiring provider notification, and discussing the importance of follow up with  specialist as directed. Reviewed [employer] announcements that all [place] members will receive the Healthy Lifestyle Premium rate in 2021.  Discussed [place]'s Employee Assistance Counseling Program and included brochure in home mailing. Reviewed low sodium heart healthy diet and included a copy of the diet in home mailing.  Discussed the hospital indemnity insurance and included information on how to determine if he purchased the benefit and written information on the insurance and how to contact [employer] if he did purchase the insurance in the home mailing.  No ongoing care management needs identified so will close case to [place] Management services and route educational materials letter with aforementioned informational documents to [place] Management clinical pool to be mailed to patient's home address.   Barrington Ellison RN,CCM,CDE [place] Management Coordinator Office Phone [phone] Office Fax [phone]

## 2018-10-21 ENCOUNTER — Emergency Department (HOSPITAL_BASED_OUTPATIENT_CLINIC_OR_DEPARTMENT_OTHER)
Admission: EM | Admit: 2018-10-21 | Discharge: 2018-10-21 | Disposition: A | Discharge status: Home / Self Care | Payer: 59 | Attending: Emergency Medicine | Admitting: Emergency Medicine

## 2018-10-21 ENCOUNTER — Encounter (HOSPITAL_BASED_OUTPATIENT_CLINIC_OR_DEPARTMENT_OTHER): Payer: Self-pay | Admitting: Emergency Medicine

## 2018-10-21 ENCOUNTER — Other Ambulatory Visit: Payer: Self-pay

## 2018-10-21 DIAGNOSIS — L0291 Cutaneous abscess, unspecified: Secondary | ICD-10-CM | POA: Diagnosis present

## 2018-10-21 DIAGNOSIS — L02211 Cutaneous abscess of abdominal wall: Secondary | ICD-10-CM | POA: Diagnosis not present

## 2018-10-21 DIAGNOSIS — Z79899 Other long term (current) drug therapy: Secondary | ICD-10-CM | POA: Insufficient documentation

## 2018-10-21 DIAGNOSIS — L02214 Cutaneous abscess of groin: Secondary | ICD-10-CM | POA: Diagnosis not present

## 2018-10-21 MED ORDER — CEPHALEXIN 500 MG PO CAPS: 500.0000 mg | ORAL_CAPSULE | Freq: Four times a day (QID) | ORAL | 0 refills | Status: DC

## 2018-10-21 MED ORDER — SULFAMETHOXAZOLE-TRIMETHOPRIM 800-160 MG PO TABS: 1.0000 | ORAL_TABLET | Freq: Two times a day (BID) | ORAL | 0 refills | Status: AC

## 2018-10-21 NOTE — ED Triage Notes (Signed)
Abscess to groin area x 2 days.

## 2018-10-21 NOTE — Discharge Instructions (Signed)
Begin taking Keflex and Bactrim as prescribed.  Apply warm compresses as frequently as possible for the next several days.  Return to the emergency department for increasing pain, increased swelling, high fevers, or other new and concerning symptoms.

## 2018-10-21 NOTE — ED Notes (Signed)
Pt verbalized understanding of dc instructions.

## 2018-10-21 NOTE — ED Provider Notes (Signed)
[facility] EMERGENCY DEPARTMENT Provider Note   CSN: [id number] Arrival date & time: 10/21/18  0700     History   Chief Complaint Chief Complaint  Patient presents with  . Abscess    HPI Joshua Hubbard is a 43 y.o. male.     Patient is a 43 year old male with no significant past medical history.  He presents today with complaints of pain and swelling to the suprapubic region.  He has a tender bump with surrounding redness.  He denies any fevers or chills.  He denies any injury or trauma.  The history is provided by the patient.  Abscess Abscess location: Groin/suprapubic region. Abscess quality: induration, painful and redness   Abscess quality: no fluctuance   Red streaking: no   Duration:  2 days Progression:  Worsening Pain details:    Quality:  Throbbing   Severity:  Moderate   Timing:  Constant   Progression:  Worsening Chronicity:  New Context: not diabetes and not immunosuppression   Relieved by:  Nothing Exacerbated by: Movement and palpation. Associated symptoms: no fever     Past Medical History:  Diagnosis Date  . Arthritis   . PONV (postoperative nausea and vomiting)   . Tears of meniscus and ACL of left knee 03/19/2018    Patient Active Problem List   Diagnosis Date Noted  . Alcohol abuse with unspecified alcohol-induced disorder ([place]) 10/08/2018  . GI bleed 10/05/2018  . Liver cirrhosis ([place]) 10/05/2018  . Alcohol withdrawal ([place]) 10/05/2018  . Hypokalemia 10/05/2018  . Thrombocytopenia ([place]) 10/05/2018  . Tears of meniscus and ACL of left knee 03/19/2018  . Umbilical hernia without obstruction and without gangrene 04/14/2017  . Asymmetrical hearing loss of right ear 03/02/2017  . Tinnitus aurium, right 03/02/2017  . Tinnitus of both ears 02/01/2017  . Hoarseness of voice 02/01/2017  . Acute suppurative otitis media of right ear with spontaneous rupture of tympanic membrane 07/25/2016  . 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 CLOSED DISLOCATION OF TARSAL JOINT UNSPECIFIED 12/25/2007    Past Surgical History:  Procedure Laterality Date  . ARTHROSCOPIC REPAIR ACL     x2  . BIOPSY  10/05/2018   Procedure: BIOPSY;  Surgeon: Ronald Lobo, MD;  Location: [place];  Service: Endoscopy;;  . ESOPHAGOGASTRODUODENOSCOPY (EGD) WITH PROPOFOL N/A 10/05/2018   Procedure: ESOPHAGOGASTRODUODENOSCOPY (EGD) WITH PROPOFOL;  Surgeon: Ronald Lobo, MD;  Location: [facility];  Service: Endoscopy;  Laterality: N/A;  . HERNIA REPAIR     x3  . KNEE ARTHROSCOPY WITH ANTERIOR CRUCIATE LIGAMENT (ACL) REPAIR WITH HAMSTRING GRAFT Left 03/20/2018   Procedure: KNEE ARTHROSCOPY WITH REVESION ANTERIOR CRUCIATE LIGAMENT (ACL) REPAIR WITH  HAMSTRING ALLOGRAFT,;  Surgeon: Elsie Saas, MD;  Location: [facility];  Service: Orthopedics;  Laterality: Left;  . KNEE ARTHROSCOPY WITH LATERAL MENISECTOMY Left 03/20/2018   Procedure: KNEE ARTHROSCOPY WITH LATERAL MENISECTOMY;  Surgeon: Elsie Saas, MD;  Location: [facility] [place];  Service: Orthopedics;  Laterality: Left;  . KNEE ARTHROSCOPY WITH MEDIAL MENISECTOMY Left 03/20/2018   Procedure: KNEE ARTHROSCOPY WITH MEDIAL MENISECTOMY;  Surgeon: Elsie Saas, MD;  Location: [facility];  Service: Orthopedics;  Laterality: Left;        Home Medications    Prior to Admission medications   Medication Sig Start Date End Date Taking? Authorizing Provider  clonazePAM (KLONOPIN) 1 MG tablet TAKE 1 TABLET AT BEDTIME Patient taking differently: Take 1  mg by mouth at bedtime.  07/02/18   [name], MD  folic acid (FOLVITE) 1 MG tablet Take 1 tablet (1 mg total) by mouth daily. 10/07/18 11/06/18  Terrilee Croak, MD  Multiple Vitamin (MULTIVITAMIN WITH MINERALS) TABS tablet Take 1 tablet by mouth daily.    [provider]  pantoprazole (PROTONIX) 40 MG tablet Take 1 tablet (40  mg total) by mouth 2 (two) times daily. 10/07/18 11/06/18  Terrilee Croak, MD  propranolol (INDERAL) 10 MG tablet Take 1 tablet (10 mg total) by mouth 2 (two) times daily. 10/07/18 11/06/18  Terrilee Croak, MD  thiamine 100 MG tablet Take 1 tablet (100 mg total) by mouth daily. 10/07/18 11/06/18  Terrilee Croak, MD    Family History Family History  Problem Relation Age of Onset  . Osteoarthritis Mother   . Coronary artery disease Father   . Hyperlipidemia Father     Social History Social History   Tobacco Use  . Smoking status: Never Smoker  . Smokeless tobacco: Never Used  Substance Use Topics  . Alcohol use: Yes    Alcohol/week: 42.0 standard drinks    Types: 42 Cans of beer per week    Frequency: Never  . Drug use: No     Allergies   Patient has no known allergies.   Review of Systems Review of Systems  Constitutional: Negative for fever.  All other systems reviewed and are negative.    Physical Exam Updated Vital Signs BP 128/73 (BP Location: Left Arm)   Pulse 74   Temp 99.5 F (37.5 C) (Oral)   Resp 18   Ht 5\' 5"  (1.651 m)   Wt 81.6 kg   SpO2 100%   BMI 29.95 kg/m   Physical Exam Vitals signs and nursing note reviewed.  Constitutional:      General: He is not in acute distress.    Appearance: Normal appearance. He is not ill-appearing.  HENT:     Head: Normocephalic and atraumatic.  Pulmonary:     Effort: Pulmonary effort is normal.  Skin:    Comments: There is a 1 cm, round area of induration with surrounding erythema.  It is tender to the touch.  There is no fluctuance.  Neurological:     Mental Status: He is alert and oriented to person, place, and time.      ED Treatments / Results  Labs (all labs ordered are listed, but only abnormal results are displayed) Labs Reviewed - No data to display  EKG None  Radiology No results found.  Procedures Procedures (including critical care time)  Medications Ordered in ED Medications - No data  to display   Initial Impression / Assessment and Plan / ED Course  I have reviewed the triage vital signs and the nursing notes.  Pertinent labs & imaging results that were available during my care of the patient were reviewed by me and considered in my medical decision making (see chart for details).  Patient with what appears to be an early abscess in the suprapubic region.  The skin overlying the indurated area is very thin, but there is no palpable fluctuance.  I feel as though antibiotic therapy and warm soaks are appropriate at this time.  I will forego incision and drainage at this time and proceed with the above therapy.  Final Clinical Impressions(s) / ED Diagnoses   Final diagnoses:  None    ED Discharge Orders    None       Johnanthony Wilden,  Nathaneil Canary, MD 10/21/18 [phone]

## 2018-10-22 ENCOUNTER — Encounter: Payer: Self-pay | Admitting: Family Medicine

## 2018-10-22 NOTE — Progress Notes (Signed)
Patient seen in ED yesterday for suprapubic abscess - examined today and noted area is draining small amount of purulent material but without surrounding erythema - patient reports he did take a knife to this yesterday and it started draining - advised against doing this.  He has not yet picked up keflex and bactrim - advised to do this and take as directed for 7 days.  Keep area covered except when applying warm compresses, warm soaks to encourage drainage.  Will monitor over the next week.

## 2018-10-23 MED FILL — SULFAMETHOXAZOLE-TMP DS TAB: 800-160 | 7 days supply | Qty: 14 | Fill #0

## 2018-10-23 MED FILL — CEPHALEXIN 500 MG CAPSULE: 500 | 7 days supply | Qty: 28 | Fill #0

## 2018-12-05 DIAGNOSIS — K7010 Alcoholic hepatitis without ascites: Secondary | ICD-10-CM | POA: Diagnosis not present

## 2018-12-05 DIAGNOSIS — K7030 Alcoholic cirrhosis of liver without ascites: Secondary | ICD-10-CM | POA: Diagnosis not present

## 2018-12-05 DIAGNOSIS — Z8719 Personal history of other diseases of the digestive system: Secondary | ICD-10-CM | POA: Diagnosis not present

## 2018-12-05 DIAGNOSIS — K219 Gastro-esophageal reflux disease without esophagitis: Secondary | ICD-10-CM | POA: Diagnosis not present

## 2018-12-05 DIAGNOSIS — D649 Anemia, unspecified: Secondary | ICD-10-CM | POA: Diagnosis not present

## 2018-12-05 MED FILL — PANTOPRAZOLE SOD DR 40 MG T: 40 | 30 days supply | Qty: 30 | Fill #0

## 2018-12-07 ENCOUNTER — Ambulatory Visit (INDEPENDENT_AMBULATORY_CARE_PROVIDER_SITE_OTHER): Payer: 59 | Admitting: Family Medicine

## 2018-12-07 ENCOUNTER — Other Ambulatory Visit: Payer: Self-pay

## 2018-12-07 DIAGNOSIS — F1019 Alcohol abuse with unspecified alcohol-induced disorder: Secondary | ICD-10-CM | POA: Diagnosis not present

## 2018-12-07 DIAGNOSIS — H9201 Otalgia, right ear: Secondary | ICD-10-CM

## 2018-12-09 NOTE — Assessment & Plan Note (Signed)
Discussed. Greater than 50% of our 25 minute office visit was spent in counseling and education regarding these issues.

## 2018-12-09 NOTE — Progress Notes (Signed)
  Joshua Hubbard - 43 y.o. male MRN [id number]  Date of birth: October 29, 1975    SUBJECTIVE:      Chief Complaint:/ HPI:    Right ear pain. Feels stuffy and occasionally has sharp pain. No change in hearing. 2. Recent ED trip for UGI bleed. Continues to have some stomach issues but better since PPI. Attempting LSM. 3. Stressors:  ROS:     Pertinent review of systems: negative for fever or unusual weight change.   PERTINENT  PMH / PSH FH / / SH:  Past Medical, Surgical, Social, and Family History Reviewed & Updated in the EMR.  Pertinent findings include:  Recent ED notes for UGIB reviewed  OBJECTIVE: There were no vitals taken for this visit.  Physical Exam:  Vital signs are reviewed. Vital signs reviewed. GENERAL: Well-developed, well-nourished, no acute distress. CARDIOVASCULAR: Regular rate and rhythm no murmur gallop or rub LUNGS: Clear to auscultation bilaterally, no rales or wheeze. ABDOMEN: Soft positive bowel sounds NEURO: No gross focal neurological deficits. MSK: Movement of extremity x 4. HEENT: TM right slightly retracted but still mobile PSYCH: AxOx4. Good eye contact.. No psychomotor retardation or agitation. Appropriate speech fluency and content. Asks and answers questions appropriately. Mood is congruent.    ASSESSMENT & PLAN:  See problem based charting & AVS for pt instructions. No problem-specific Assessment & Plan notes found for this encounter. 1. Ear pain: use OTC decongestant. 2. Stress:discussed

## 2018-12-12 DIAGNOSIS — R509 Fever, unspecified: Secondary | ICD-10-CM | POA: Diagnosis not present

## 2018-12-12 DIAGNOSIS — J029 Acute pharyngitis, unspecified: Secondary | ICD-10-CM | POA: Diagnosis not present

## 2018-12-12 DIAGNOSIS — R5383 Other fatigue: Secondary | ICD-10-CM | POA: Diagnosis not present

## 2018-12-17 DIAGNOSIS — J029 Acute pharyngitis, unspecified: Secondary | ICD-10-CM | POA: Diagnosis not present

## 2018-12-18 ENCOUNTER — Telehealth: Payer: Self-pay | Admitting: *Deleted

## 2018-12-18 NOTE — Telephone Encounter (Signed)
Received call from pharmacy that [place]  They need an override to fill early.  Last fill was 10/12/18 (90 day supply), he is not due until 01/11/19 but insurance will cover @ 12/25/18.  Spoke with Dr. Nori Riis who is ok with him picking up on 12/25/18.  [facility] outpatient pharmacy informed.   Christen Bame, CMA

## 2018-12-25 MED FILL — clonazePAM 1 MG TABS: 1 | 90 days supply | Qty: 90 | Fill #1

## 2019-01-02 ENCOUNTER — Encounter: Payer: Self-pay | Admitting: Family Medicine

## 2019-01-02 ENCOUNTER — Other Ambulatory Visit: Payer: Self-pay

## 2019-01-02 ENCOUNTER — Ambulatory Visit (INDEPENDENT_AMBULATORY_CARE_PROVIDER_SITE_OTHER): Payer: 59 | Admitting: Family Medicine

## 2019-01-02 DIAGNOSIS — F1019 Alcohol abuse with unspecified alcohol-induced disorder: Secondary | ICD-10-CM

## 2019-01-03 ENCOUNTER — Encounter: Payer: Self-pay | Admitting: Family Medicine

## 2019-01-03 NOTE — Progress Notes (Signed)
    CHIEF COMPLAINT / HPI: Follow-up alcohol misuse.  Has been somewhat successful in decreasing drinking.  There several days in a row when he did not have any alcohol and then he "fell off the wagon".  Says weekends are hardest for him.  He has established with an Alcoholics Anonymous program but is only able to go twice a week.  His wife is being supportive.  REVIEW OF SYSTEMS: No cough, no abdominal pain, no fever.  PERTINENT  PMH / PSH: I have reviewed the patient's medications, allergies, past medical and surgical history, smoking status and updated in the EMR as appropriate.   OBJECTIVE: GENERAL: Well-developed male no acute distress PSYCH: AxOx4. Good eye contact.. No psychomotor retardation or agitation. Appropriate speech fluency and content. Asks and answers questions appropriately. Mood is congruent.   ASSESSMENT / PLAN:   Alcohol abuse with unspecified alcohol-induced disorder ([place]) Applauded his entrance into Alcoholics Anonymous.  We discussed continued sobriety and methods to help him attain that.  He will continue regular follow-up with me.  He will call in the interim with new or worsening symptoms.Greater than 50% of our 25 minute office visit was spent in counseling and education regarding these issues.

## 2019-01-03 NOTE — Assessment & Plan Note (Signed)
Applauded his entrance into Alcoholics Anonymous.  We discussed continued sobriety and methods to help him attain that.  He will continue regular follow-up with me.  He will call in the interim with new or worsening symptoms.Greater than 50% of our 25 minute office visit was spent in counseling and education regarding these issues.

## 2019-01-07 ENCOUNTER — Other Ambulatory Visit: Payer: Self-pay

## 2019-01-07 ENCOUNTER — Ambulatory Visit (INDEPENDENT_AMBULATORY_CARE_PROVIDER_SITE_OTHER): Payer: 59 | Admitting: Family Medicine

## 2019-01-07 VITALS — BP 116/70 | Ht 69.0 in | Wt 172.0 lb

## 2019-01-07 DIAGNOSIS — R252 Cramp and spasm: Secondary | ICD-10-CM | POA: Diagnosis not present

## 2019-01-07 DIAGNOSIS — S39011A Strain of muscle, fascia and tendon of abdomen, initial encounter: Secondary | ICD-10-CM

## 2019-01-07 NOTE — Patient Instructions (Signed)
Get labwork at your convenience. Continue with the hydration - this is important. Do the single leg calf raises 3 sets of 10 once a day - if too easy you can do these on a step. Do stretches as well - hold for 20-30 seconds, repeat 3 times. Drinking a gatorade at dinner may help with nighttime cramping. Vitamin B complex and vitamin E may have some beneficial effects on cramping if this continues despite the above.  You have an oblique strain from extreme twisting while eating the [place]. Topical voltaren gel up to 4 times a day. Heat 15 minutes at a time 3-4 times a day. This should resolve over the next week.

## 2019-01-08 ENCOUNTER — Telehealth: Payer: 59 | Admitting: Sports Medicine

## 2019-01-08 ENCOUNTER — Encounter: Payer: Self-pay | Admitting: Family Medicine

## 2019-01-08 DIAGNOSIS — R252 Cramp and spasm: Secondary | ICD-10-CM | POA: Diagnosis not present

## 2019-01-08 NOTE — Progress Notes (Signed)
PCP: [name], MD  Subjective:   HPI: Patient is a 43 y.o. male here for calf cramping, right side pain.  Patient reports over past 3 weeks he's had pain and cramping in both calf muscles (not at the same time). Bad and severe enough that it wakes him from sleep, is sharp. No acute injury. Has been drinking a lot of water without much change noted. Not tried other medications due to stomach and liver issues. When in hospital 3 months ago his calcium and potassium levels were low - potassium was repleted. Also with 2 days of right side/abdominal pain. No radiation of this into legs or groin. No numbness or tingling. No acute injury - noticed it during the day when he was eating.  Past Medical History:  Diagnosis Date  . Arthritis   . PONV (postoperative nausea and vomiting)   . Tears of meniscus and ACL of left knee 03/19/2018    Current Outpatient Medications on File Prior to Visit  Medication Sig Dispense Refill  . clonazePAM (KLONOPIN) 1 MG tablet TAKE 1 TABLET AT BEDTIME (Patient taking differently: Take 1 mg by mouth at bedtime. ) 90 tablet 3  . Multiple Vitamin (MULTIVITAMIN WITH MINERALS) TABS tablet Take 1 tablet by mouth daily.    . pantoprazole (PROTONIX) 40 MG tablet Take 1 tablet (40 mg total) by mouth 2 (two) times daily. 60 tablet 0  . propranolol (INDERAL) 10 MG tablet Take 1 tablet (10 mg total) by mouth 2 (two) times daily. 60 tablet 0   No current facility-administered medications on file prior to visit.    Past Surgical History:  Procedure Laterality Date  . ARTHROSCOPIC REPAIR ACL     x2  . BIOPSY  10/05/2018   Procedure: BIOPSY;  Surgeon: Ronald Lobo, MD;  Location: [place];  Service: Endoscopy;;  . ESOPHAGOGASTRODUODENOSCOPY (EGD) WITH PROPOFOL N/A 10/05/2018   Procedure: ESOPHAGOGASTRODUODENOSCOPY (EGD) WITH PROPOFOL;  Surgeon: Ronald Lobo, MD;  Location: [place];  Service: Endoscopy;  Laterality: N/A;  . HERNIA REPAIR     x3  .  KNEE ARTHROSCOPY WITH ANTERIOR CRUCIATE LIGAMENT (ACL) REPAIR WITH HAMSTRING GRAFT Left 03/20/2018   Procedure: KNEE ARTHROSCOPY WITH REVESION ANTERIOR CRUCIATE LIGAMENT (ACL) REPAIR WITH  HAMSTRING ALLOGRAFT,;  Surgeon: Elsie Saas, MD;  Location: [place];  Service: Orthopedics;  Laterality: Left;  . KNEE ARTHROSCOPY WITH LATERAL MENISECTOMY Left 03/20/2018   Procedure: KNEE ARTHROSCOPY WITH LATERAL MENISECTOMY;  Surgeon: Elsie Saas, MD;  Location: [place];  Service: Orthopedics;  Laterality: Left;  . KNEE ARTHROSCOPY WITH MEDIAL MENISECTOMY Left 03/20/2018   Procedure: KNEE ARTHROSCOPY WITH MEDIAL MENISECTOMY;  Surgeon: Elsie Saas, MD;  Location: [place];  Service: Orthopedics;  Laterality: Left;    No Known Allergies  Social History   Socioeconomic History  . Marital status: Married    Spouse name: Not on file  . Number of children: Not on file  . Years of education: Not on file  . Highest education level: Not on file  Occupational History  . Not on file  Tobacco Use  . Smoking status: Never Smoker  . Smokeless tobacco: Never Used  Substance and Sexual Activity  . Alcohol use: Yes    Alcohol/week: 42.0 standard drinks    Types: 42 Cans of beer per week  . Drug use: No  . Sexual activity: Not on file  Other Topics Concern  . Not on file  Social History Narrative  . Not on  file   Social Determinants of Health   Financial Resource Strain:   . Difficulty of Paying Living Expenses: Not on file  Food Insecurity:   . Worried About Charity fundraiser in the Last Year: Not on file  . Ran Out of Food in the Last Year: Not on file  Transportation Needs:   . Lack of Transportation (Medical): Not on file  . Lack of Transportation (Non-Medical): Not on file  Physical Activity:   . Days of Exercise per Week: Not on file  . Minutes of Exercise per Session: Not on file  Stress:   . Feeling of Stress : Not on file   Social Connections:   . Frequency of Communication with Friends and Family: Not on file  . Frequency of Social Gatherings with Friends and Family: Not on file  . Attends Religious Services: Not on file  . Active Member of Clubs or Organizations: Not on file  . Attends Archivist Meetings: Not on file  . Marital Status: Not on file  Intimate Partner Violence:   . Fear of Current or Ex-Partner: Not on file  . Emotionally Abused: Not on file  . Physically Abused: Not on file  . Sexually Abused: Not on file    Family History  Problem Relation Age of Onset  . Osteoarthritis Mother   . Coronary artery disease Father   . Hyperlipidemia Father     BP 116/70   Ht 5\' 9"  (1.753 m)   Wt 172 lb (78 kg)   BMI 25.40 kg/m   Review of Systems: See HPI above.     Objective:  Physical Exam:  Gen: NAD, comfortable in exam room  Right lower leg/ankle: No gross deformity, swelling, ecchymoses. FROM ankle with 5/5 strength. TTP very mildly medial gastroc. Negative ant drawer and talar tilt.   Negative syndesmotic compression. Thompsons test negative. NV intact distally.  Left lower leg/ankle: No gross deformity, swelling, ecchymoses. FROM ankle with 5/5 strength. TTP mildly medial gastroc. Negative ant drawer and talar tilt.   Negative syndesmotic compression. Thompsons test negative. NV intact distally.  Low back/abdomen: No gross deformity, scoliosis. TTP right obliques.  No midline or bony TTP. FROM without pain. Strength LEs 5/5 all muscle groups.   1+ MSRs in patellar and achilles tendons, equal bilaterally. Negative SLRs. Sensation intact to light touch bilaterally. Negative logroll bilateral hips Negative fabers and piriformis stretches.   Assessment & Plan:  1. Nocturnal leg cramps - over past 3 weeks.  He did have low calcium and potassium when in hospital 3 weeks ago - will check CMP, Mg.  Continue with hydration.  Strengthening exercises and  stretching.  Discussed can try gatorade at dinner in meantime.  Vitamin B complex, E may be helpful.  2. Oblique strain - mild.  Expect this to improve over the next week.  Topical voltaren, heat.

## 2019-01-09 LAB — BASIC METABOLIC PANEL
BUN/Creatinine Ratio: 8 — ABNORMAL LOW (ref 9–20)
BUN: 5 mg/dL — ABNORMAL LOW (ref 6–24)
CO2: 22 mmol/L (ref 20–29)
Calcium: 8.9 mg/dL (ref 8.7–10.2)
Chloride: 97 mmol/L (ref 96–106)
Creatinine, Ser: 0.63 mg/dL — ABNORMAL LOW (ref 0.76–1.27)
GFR calc Af Amer: 139 mL/min/{1.73_m2} (ref >59)
GFR calc non Af Amer: 120 mL/min/{1.73_m2} (ref >59)
Glucose: 90 mg/dL (ref 65–99)
Potassium: 4.2 mmol/L (ref 3.5–5.2)
Sodium: 134 mmol/L (ref 134–144)

## 2019-01-09 LAB — MAGNESIUM: Magnesium: 1.5 mg/dL — ABNORMAL LOW (ref 1.6–2.3)

## 2019-01-10 MED ORDER — MAGNESIUM CHLORIDE 64 MG PO TBEC: 1.0000 | DELAYED_RELEASE_TABLET | Freq: Two times a day (BID) | ORAL | 1 refills | Status: DC

## 2019-01-10 NOTE — Addendum Note (Signed)
Addended by: Karlton Lemon R on: 01/10/2019 10:30 AM   Modules accepted: Orders

## 2019-01-15 DIAGNOSIS — K7010 Alcoholic hepatitis without ascites: Secondary | ICD-10-CM | POA: Diagnosis not present

## 2019-01-15 DIAGNOSIS — Z8719 Personal history of other diseases of the digestive system: Secondary | ICD-10-CM | POA: Diagnosis not present

## 2019-02-10 ENCOUNTER — Emergency Department (HOSPITAL_COMMUNITY): Payer: 59

## 2019-02-10 ENCOUNTER — Inpatient Hospital Stay (HOSPITAL_COMMUNITY)
Admission: EM | Admit: 2019-02-10 | Discharge: 2019-02-15 | DRG: 432 | Disposition: A | Discharge status: Home / Self Care | Payer: 59 | Attending: Internal Medicine | Admitting: Internal Medicine

## 2019-02-10 ENCOUNTER — Other Ambulatory Visit: Payer: Self-pay

## 2019-02-10 DIAGNOSIS — Z8349 Family history of other endocrine, nutritional and metabolic diseases: Secondary | ICD-10-CM

## 2019-02-10 DIAGNOSIS — D649 Anemia, unspecified: Secondary | ICD-10-CM | POA: Diagnosis not present

## 2019-02-10 DIAGNOSIS — R55 Syncope and collapse: Secondary | ICD-10-CM | POA: Diagnosis not present

## 2019-02-10 DIAGNOSIS — R296 Repeated falls: Secondary | ICD-10-CM | POA: Diagnosis not present

## 2019-02-10 DIAGNOSIS — D689 Coagulation defect, unspecified: Secondary | ICD-10-CM | POA: Diagnosis not present

## 2019-02-10 DIAGNOSIS — K7030 Alcoholic cirrhosis of liver without ascites: Secondary | ICD-10-CM | POA: Diagnosis not present

## 2019-02-10 DIAGNOSIS — K766 Portal hypertension: Secondary | ICD-10-CM | POA: Diagnosis not present

## 2019-02-10 DIAGNOSIS — R451 Restlessness and agitation: Secondary | ICD-10-CM | POA: Diagnosis present

## 2019-02-10 DIAGNOSIS — K7460 Unspecified cirrhosis of liver: Secondary | ICD-10-CM | POA: Diagnosis not present

## 2019-02-10 DIAGNOSIS — A419 Sepsis, unspecified organism: Secondary | ICD-10-CM

## 2019-02-10 DIAGNOSIS — D62 Acute posthemorrhagic anemia: Secondary | ICD-10-CM | POA: Diagnosis present

## 2019-02-10 DIAGNOSIS — D684 Acquired coagulation factor deficiency: Secondary | ICD-10-CM | POA: Diagnosis present

## 2019-02-10 DIAGNOSIS — R000 Tachycardia, unspecified: Secondary | ICD-10-CM | POA: Diagnosis not present

## 2019-02-10 DIAGNOSIS — D1809 Hemangioma of other sites: Secondary | ICD-10-CM | POA: Diagnosis present

## 2019-02-10 DIAGNOSIS — E871 Hypo-osmolality and hyponatremia: Secondary | ICD-10-CM | POA: Diagnosis present

## 2019-02-10 DIAGNOSIS — R197 Diarrhea, unspecified: Secondary | ICD-10-CM | POA: Diagnosis not present

## 2019-02-10 DIAGNOSIS — K922 Gastrointestinal hemorrhage, unspecified: Secondary | ICD-10-CM | POA: Diagnosis not present

## 2019-02-10 DIAGNOSIS — I8511 Secondary esophageal varices with bleeding: Secondary | ICD-10-CM | POA: Diagnosis not present

## 2019-02-10 DIAGNOSIS — Z20822 Contact with and (suspected) exposure to covid-19: Secondary | ICD-10-CM | POA: Diagnosis not present

## 2019-02-10 DIAGNOSIS — F419 Anxiety disorder, unspecified: Secondary | ICD-10-CM | POA: Diagnosis present

## 2019-02-10 DIAGNOSIS — D6959 Other secondary thrombocytopenia: Secondary | ICD-10-CM | POA: Diagnosis present

## 2019-02-10 DIAGNOSIS — E876 Hypokalemia: Secondary | ICD-10-CM | POA: Diagnosis not present

## 2019-02-10 DIAGNOSIS — F1011 Alcohol abuse, in remission: Secondary | ICD-10-CM | POA: Diagnosis not present

## 2019-02-10 DIAGNOSIS — K920 Hematemesis: Secondary | ICD-10-CM | POA: Diagnosis not present

## 2019-02-10 DIAGNOSIS — K529 Noninfective gastroenteritis and colitis, unspecified: Secondary | ICD-10-CM

## 2019-02-10 DIAGNOSIS — K7210 Chronic hepatic failure without coma: Secondary | ICD-10-CM | POA: Diagnosis not present

## 2019-02-10 DIAGNOSIS — Z79899 Other long term (current) drug therapy: Secondary | ICD-10-CM

## 2019-02-10 DIAGNOSIS — K2970 Gastritis, unspecified, without bleeding: Secondary | ICD-10-CM | POA: Diagnosis present

## 2019-02-10 DIAGNOSIS — D696 Thrombocytopenia, unspecified: Secondary | ICD-10-CM | POA: Diagnosis not present

## 2019-02-10 DIAGNOSIS — Z8249 Family history of ischemic heart disease and other diseases of the circulatory system: Secondary | ICD-10-CM

## 2019-02-10 DIAGNOSIS — G4700 Insomnia, unspecified: Secondary | ICD-10-CM | POA: Diagnosis present

## 2019-02-10 LAB — URINALYSIS, ROUTINE W REFLEX MICROSCOPIC
Bilirubin Urine: NEGATIVE
Glucose, UA: NEGATIVE mg/dL
Hgb urine dipstick: NEGATIVE
Ketones, ur: 5 mg/dL — AB
Leukocytes,Ua: NEGATIVE
Nitrite: NEGATIVE
Protein, ur: NEGATIVE mg/dL
Specific Gravity, Urine: 1.023 (ref 1.005–1.030)
pH: 6 (ref 5.0–8.0)

## 2019-02-10 LAB — CBC WITH DIFFERENTIAL/PLATELET
Abs Immature Granulocytes: 0.04 10*3/uL (ref 0.00–0.07)
Basophils Absolute: 0.1 10*3/uL (ref 0.0–0.1)
Basophils Relative: 1 %
Eosinophils Absolute: 0.1 10*3/uL (ref 0.0–0.5)
Eosinophils Relative: 1 %
HCT: 16.8 % — ABNORMAL LOW (ref 39.0–52.0)
Hemoglobin: 4.6 g/dL — CL (ref 13.0–17.0)
Immature Granulocytes: 0 %
Lymphocytes Relative: 15 %
Lymphs Abs: 1.6 10*3/uL (ref 0.7–4.0)
MCH: 19.4 pg — ABNORMAL LOW (ref 26.0–34.0)
MCHC: 27.4 g/dL — ABNORMAL LOW (ref 30.0–36.0)
MCV: 70.9 fL — ABNORMAL LOW (ref 80.0–100.0)
Monocytes Absolute: 1.2 10*3/uL — ABNORMAL HIGH (ref 0.1–1.0)
Monocytes Relative: 11 %
Neutro Abs: 7.6 10*3/uL (ref 1.7–7.7)
Neutrophils Relative %: 72 %
Platelets: 124 10*3/uL — ABNORMAL LOW (ref 150–400)
RBC: 2.37 MIL/uL — ABNORMAL LOW (ref 4.22–5.81)
RDW: 21.3 % — ABNORMAL HIGH (ref 11.5–15.5)
WBC: 10.6 10*3/uL — ABNORMAL HIGH (ref 4.0–10.5)
nRBC: 0 % (ref 0.0–0.2)

## 2019-02-10 LAB — COMPREHENSIVE METABOLIC PANEL
ALT: 18 U/L (ref 0–44)
AST: 34 U/L (ref 15–41)
Albumin: 2.6 g/dL — ABNORMAL LOW (ref 3.5–5.0)
Alkaline Phosphatase: 47 U/L (ref 38–126)
Anion gap: 11 (ref 5–15)
BUN: 20 mg/dL (ref 6–20)
CO2: 21 mmol/L — ABNORMAL LOW (ref 22–32)
Calcium: 7.7 mg/dL — ABNORMAL LOW (ref 8.9–10.3)
Chloride: 104 mmol/L (ref 98–111)
Creatinine, Ser: 0.77 mg/dL (ref 0.61–1.24)
GFR calc Af Amer: >60 mL/min (ref >60)
GFR calc non Af Amer: >60 mL/min (ref >60)
Glucose, Bld: 138 mg/dL — ABNORMAL HIGH (ref 70–99)
Potassium: 3.2 mmol/L — ABNORMAL LOW (ref 3.5–5.1)
Sodium: 136 mmol/L (ref 135–145)
Total Bilirubin: 1.9 mg/dL — ABNORMAL HIGH (ref 0.3–1.2)
Total Protein: 6 g/dL — ABNORMAL LOW (ref 6.5–8.1)

## 2019-02-10 LAB — POC OCCULT BLOOD, ED: Fecal Occult Bld: POSITIVE — AB

## 2019-02-10 LAB — PROTIME-INR
INR: >10 (ref 0.8–1.2)
Prothrombin Time: >90 seconds — ABNORMAL HIGH (ref 11.4–15.2)

## 2019-02-10 LAB — APTT: aPTT: >200 seconds (ref 24–36)

## 2019-02-10 LAB — LACTIC ACID, PLASMA: Lactic Acid, Venous: 3.8 mmol/L (ref 0.5–1.9)

## 2019-02-10 LAB — POC SARS CORONAVIRUS 2 AG -  ED: SARS Coronavirus 2 Ag: NEGATIVE

## 2019-02-10 LAB — LIPASE, BLOOD: Lipase: 42 U/L (ref 11–51)

## 2019-02-10 LAB — PREPARE RBC (CROSSMATCH)

## 2019-02-10 IMAGING — CT CT ABD-PELV W/ CM
2 of 5 series · 16 of 46 positions shown, 18 images · IV contrast (omnipaque)
Comparison: [DATE]

CLINICAL DATA: Fever all.  Diarrhea.  Active GI bleeding.

EXAM:
CT ABDOMEN AND PELVIS WITH CONTRAST
TECHNIQUE: Multidetector CT imaging of the abdomen and pelvis was performed
using the standard protocol following bolus administration of
intravenous contrast.
CONTRAST:  100mL OMNIPAQUE IOHEXOL 300 MG/ML  SOLN

[Series 3: a/p w/ 5mm · axial · 0.94mm/px · z∈[-858,-388]mm · 13 of 106 slices shown, 15 images]
[im 6/106  soft-tissue]
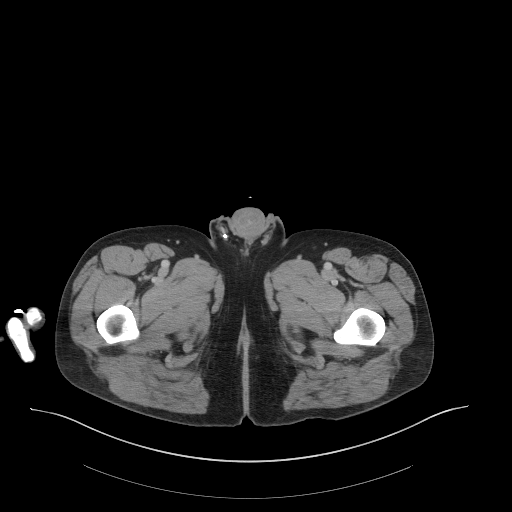
[im 6/106  bone]
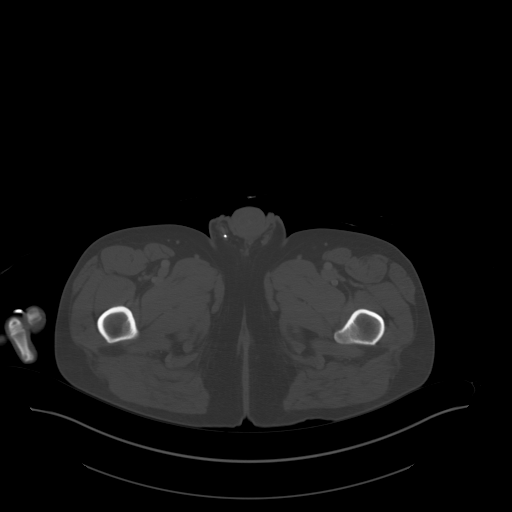
[im 12/106  soft-tissue]
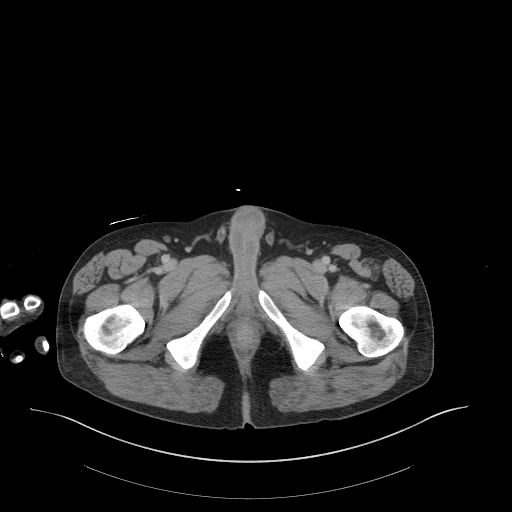
[im 24/106  soft-tissue]
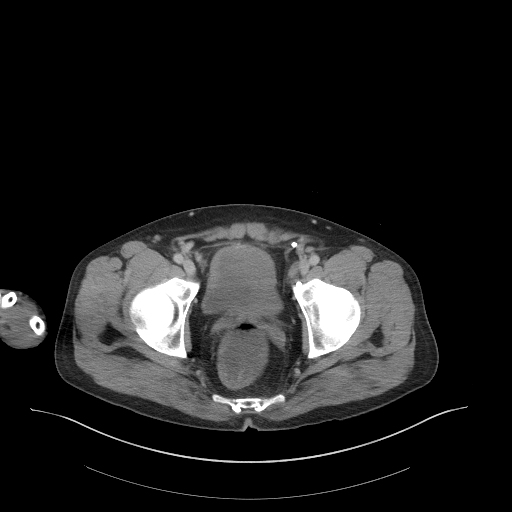
[im 30/106  soft-tissue]
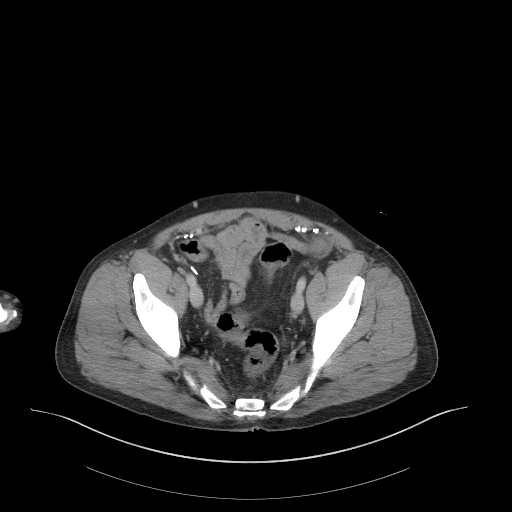
[im 36/106  soft-tissue]
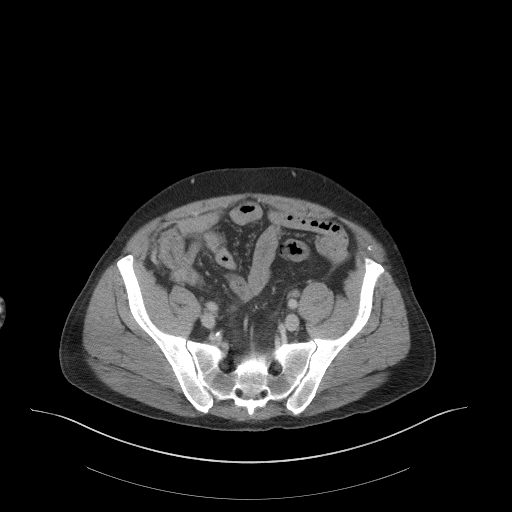
[im 47/106  soft-tissue]
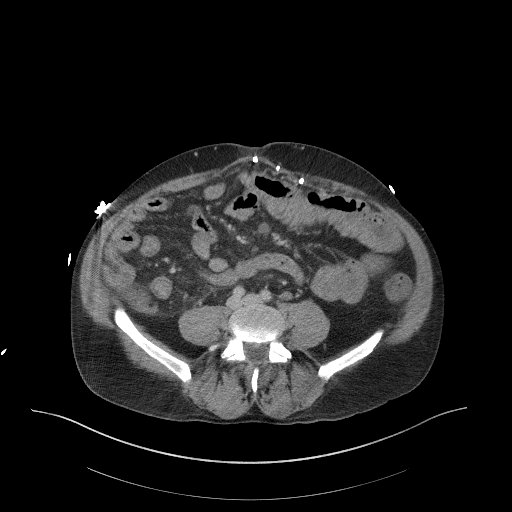
[im 53/106  soft-tissue]
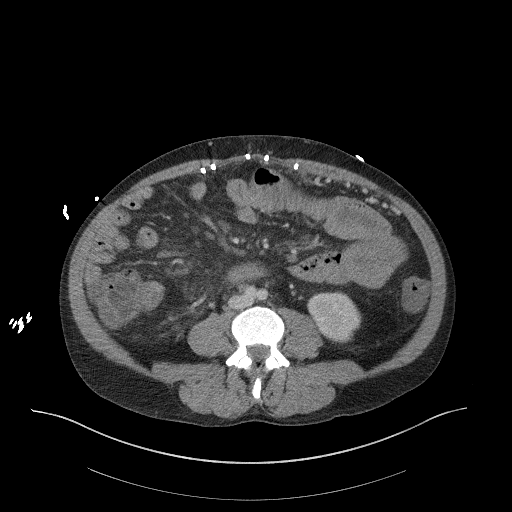
[im 59/106  soft-tissue]
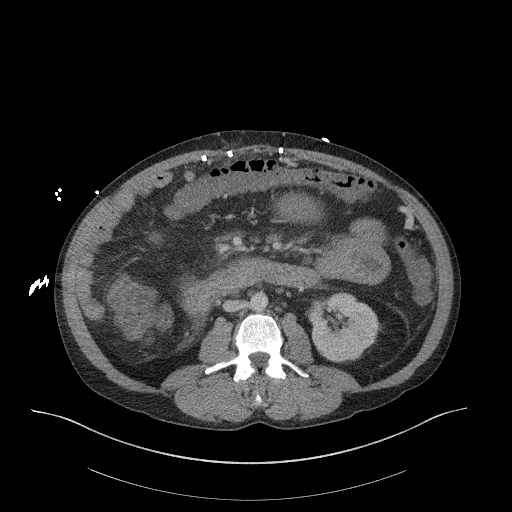
[im 71/106  soft-tissue]
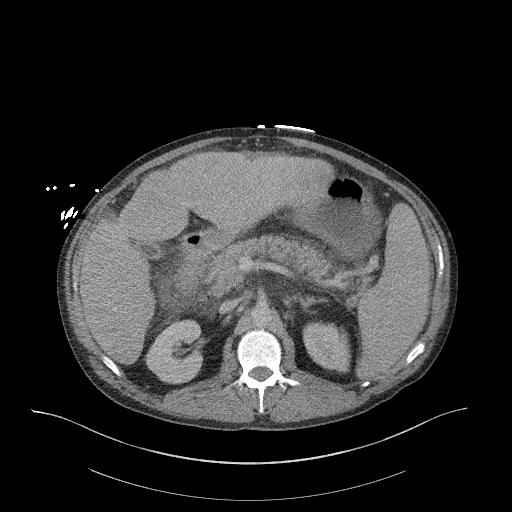
[im 71/106  bone]
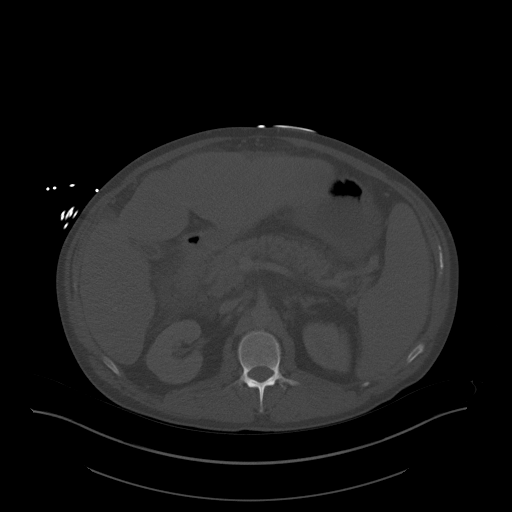
[im 76/106  soft-tissue]
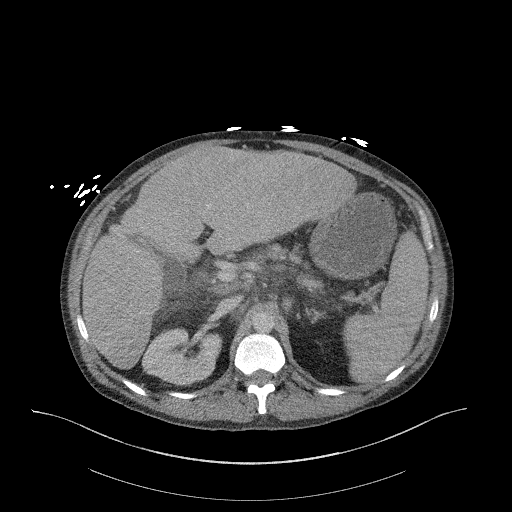
[im 82/106  soft-tissue]
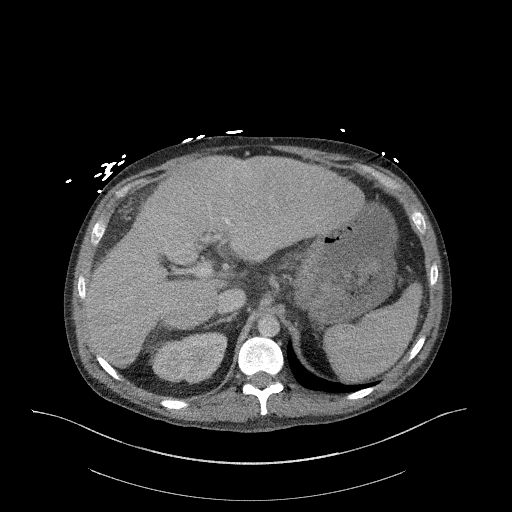
[im 94/106  soft-tissue]
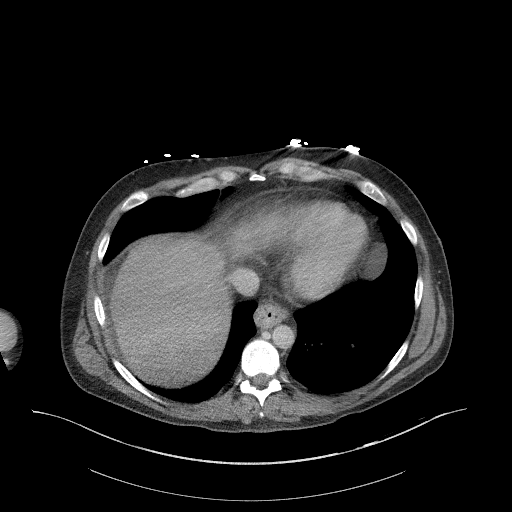
[im 100/106  soft-tissue]
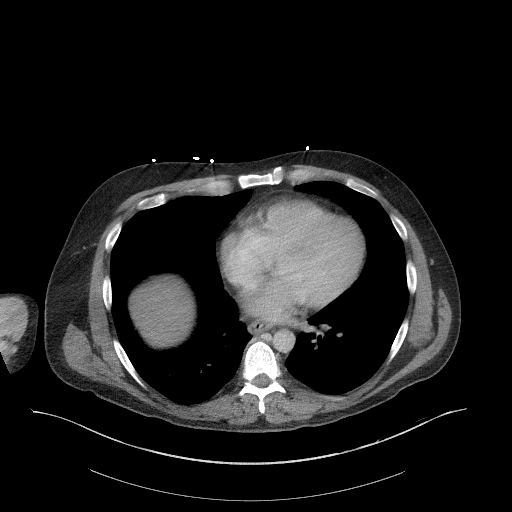

[Series 6: a/p w/ cor · coronal · 0.90mm/px · 3 of 161 slices shown]
[im 54/161  soft-tissue]
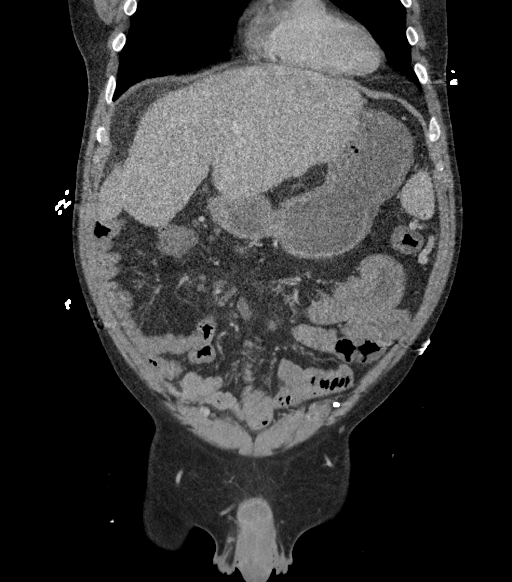
[im 72/161  soft-tissue]
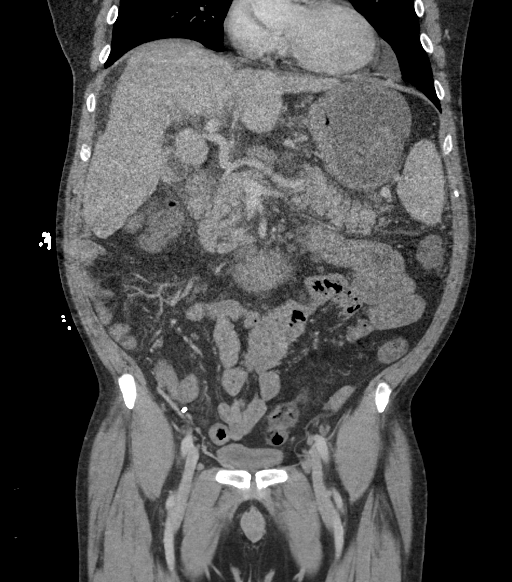
[im 89/161  soft-tissue]
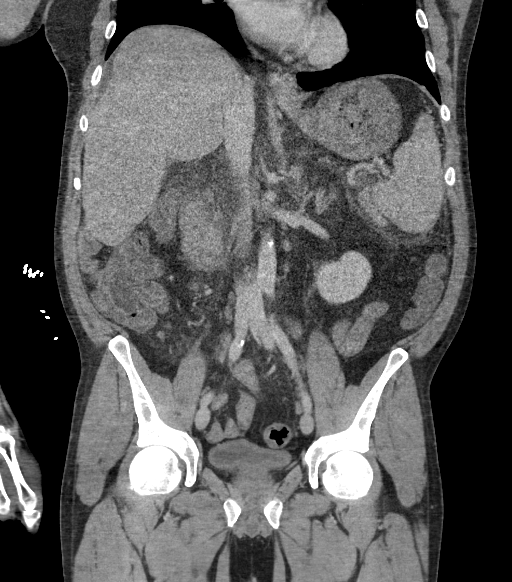

[16 of 46 positions shown; findings below may reference images not displayed]

FINDINGS: Lower chest: The lung bases are clear. The heart size is normal.
There is a probable pericardial cyst, stable from prior study.

Hepatobiliary: Again noted is severe hepatic steatosis with findings
of cirrhosis. There is a stable cystic appearing lesion involving
the right hepatic lobe. There is a stable cystic lesion involving
the anterior left hepatic lobe (axial series 3, image 36).

Pancreas: Peripancreatic fat stranding is noted.

Spleen: The spleen is significantly enlarged.

Adrenals/Urinary Tract:

--Adrenal glands: No adrenal hemorrhage.

--Right kidney/ureter: No hydronephrosis or perinephric hematoma.

--Left kidney/ureter: No hydronephrosis or perinephric hematoma.

--Urinary bladder: Unremarkable.

Stomach/Bowel:

--Stomach/Duodenum: There is a small hiatal hernia.

--Small bowel: No dilatation or inflammation.

--Colon: There may be some mild wall thickening of the splenic
flexure of the colon. There may be some mild wall thickening at the
hepatic flexure of the colon as well as the cecum and ascending
colon.

--Appendix: Normal.

Vascular/Lymphatic: Atherosclerotic calcification is present within
the non-aneurysmal abdominal aorta, without hemodynamically
significant stenosis. The portal vein remains patent. Esophageal
varices are suspected. Portosystemic shunting is noted consistent
with portal hypertension.

--there is shotty retroperitoneal adenopathy.

--mild mesenteric adenopathy is noted

--No pelvic or inguinal lymphadenopathy.

Reproductive: Unremarkable

Other: The patient is status post prior ventral hernia repair. There
is a small volume of free fluid in the patient's abdomen. There is
no free air. Mesenteric edema is noted.

Musculoskeletal. There is a bilateral pars defect at L5 with minimal
anterolisthesis of L5 on S1.
IMPRESSION: 1. Cirrhosis with stigmata of portal hypertension as detailed above.
2. Peripancreatic free fluid in fat stranding. Correlation with
lipase is recommended to help exclude underlying pancreatitis.
3. Wall thickening of significant portions of the colon. Findings
may be secondary to a diffuse infectious or inflammatory colitis.
Portal hypertensive colopathy is also within the differential.
4. Scattered retroperitoneal and mesenteric adenopathy, presumably
reactive.

## 2019-02-10 IMAGING — DX DG CHEST 1V PORT
1 series · 1 of 1 positions shown · non-contrast
Comparison: Radiograph [DATE]

CLINICAL DATA: Sepsis

EXAM:
PORTABLE CHEST 1 VIEW

[chest ap]
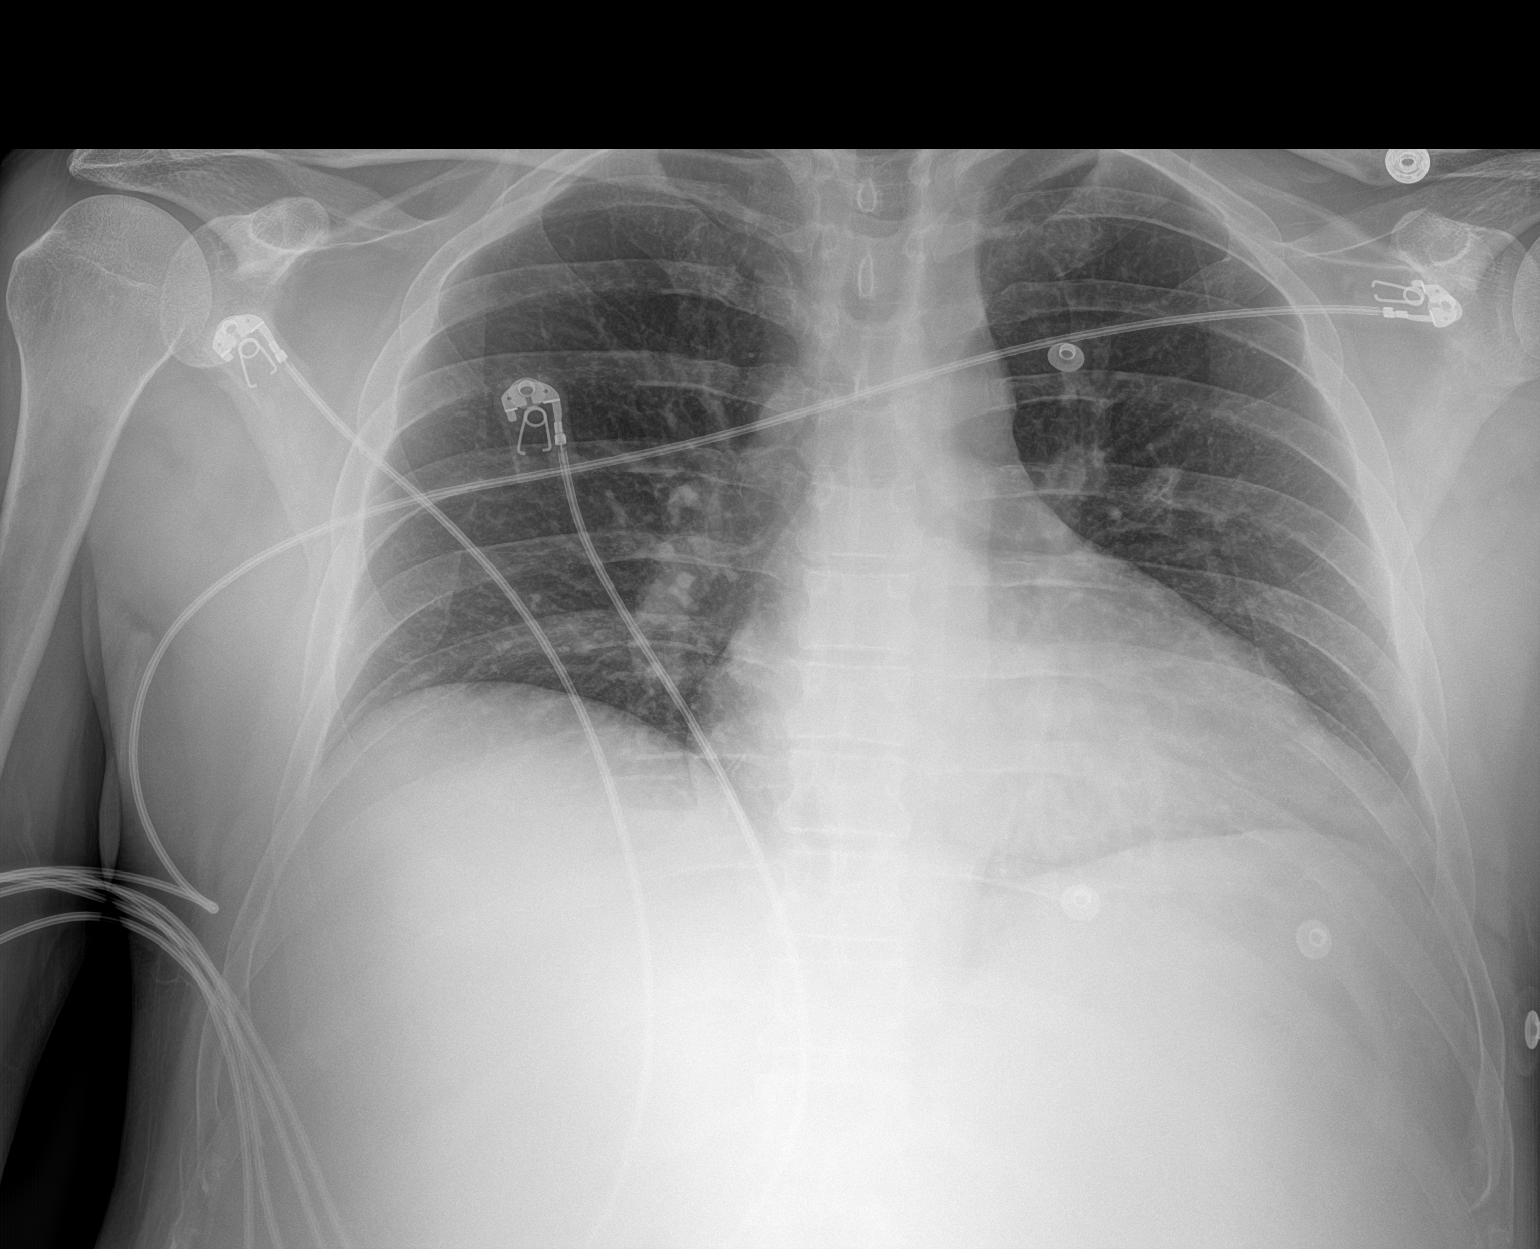

[1 of 1 positions shown; findings below may reference images not displayed]

FINDINGS: Lung volumes are low with some streaky atelectasis. No
consolidation, features of edema, pneumothorax, or effusion. The
cardiomediastinal contours are unremarkable. No acute osseous or
soft tissue abnormality.
IMPRESSION: Low lung volumes. No focal consolidative opacity or other acute
cardiopulmonary abnormality.

## 2019-02-10 IMAGING — CT CT HEAD W/O CM
4 series · 17 of 47 positions shown, 19 images · non-contrast
Comparison: None.

CLINICAL DATA: Syncope.

EXAM:
CT HEAD WITHOUT CONTRAST
TECHNIQUE: Contiguous axial images were obtained from the base of the skull
through the vertex without intravenous contrast.

[Series 3: head wo · axial · 0.51mm/px · z∈[+1179,+1309]mm · 7 of 36 slices shown, 9 images]
[im 5/36  brain]
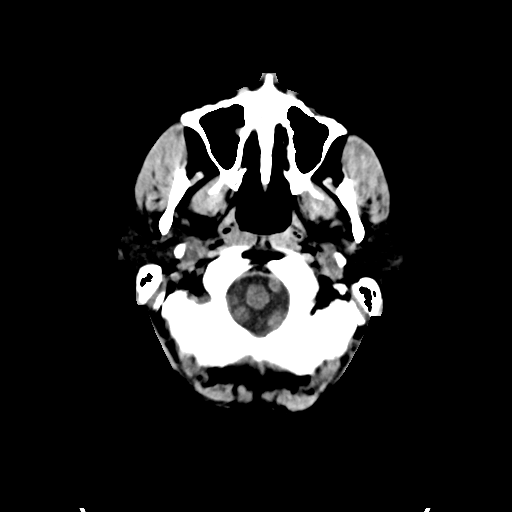
[im 5/36  bone]
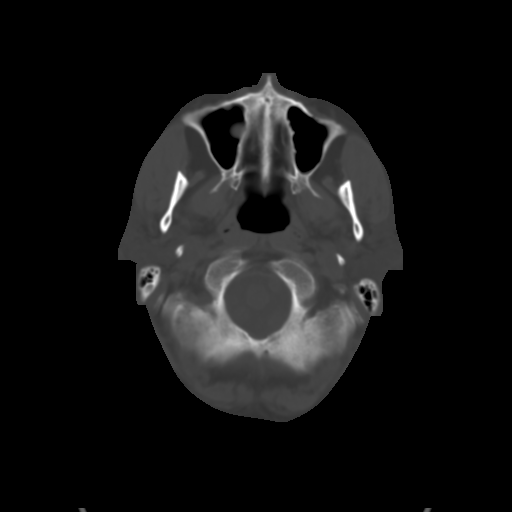
[im 9/36  brain]
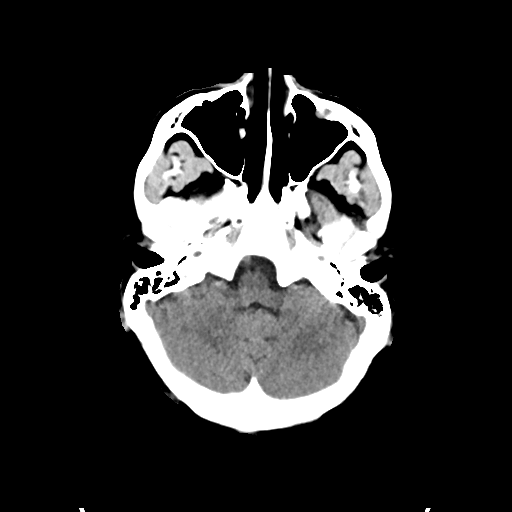
[im 14/36  brain]
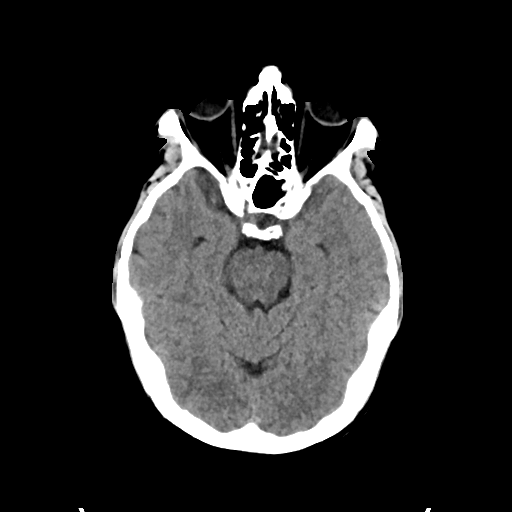
[im 18/36  brain]
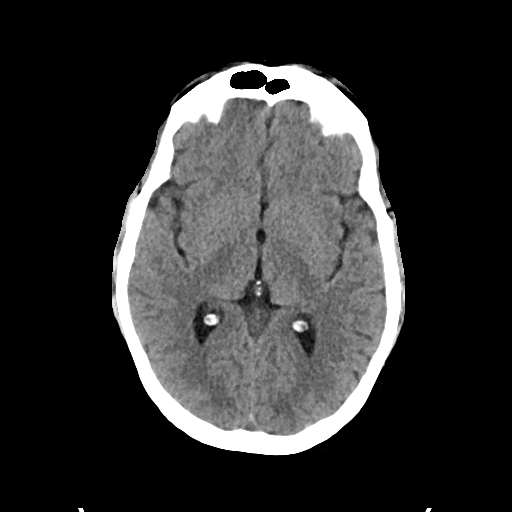
[im 22/36  brain]
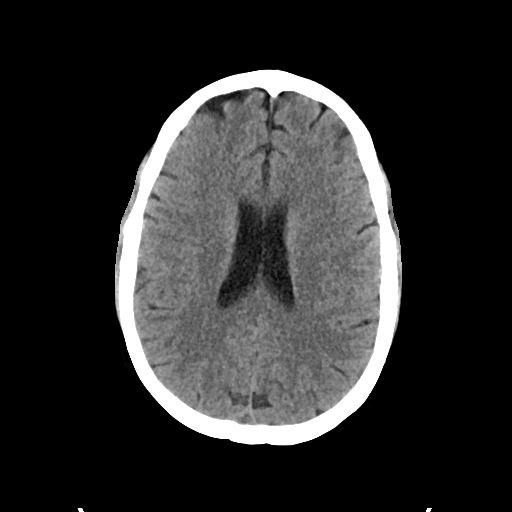
[im 22/36  bone]
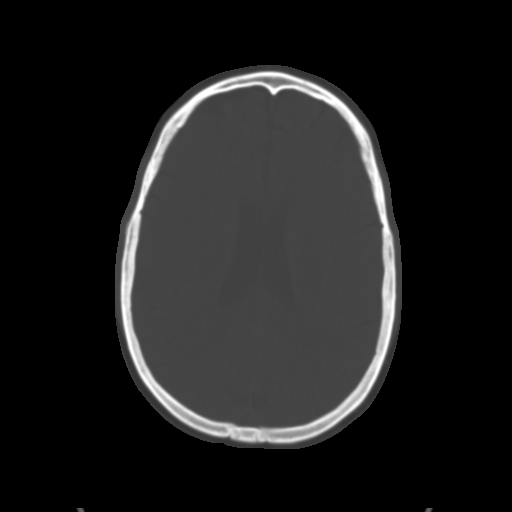
[im 27/36  brain]
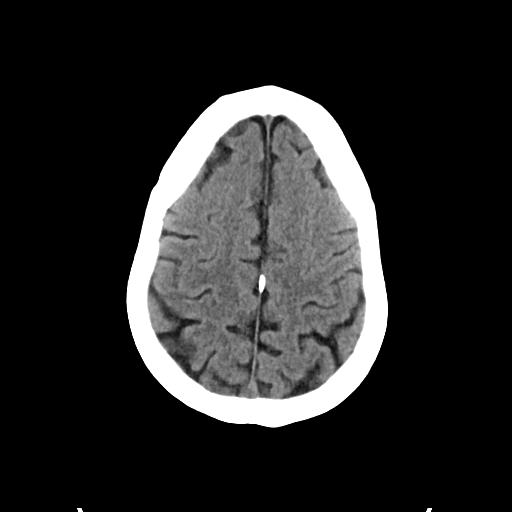
[im 31/36  brain]
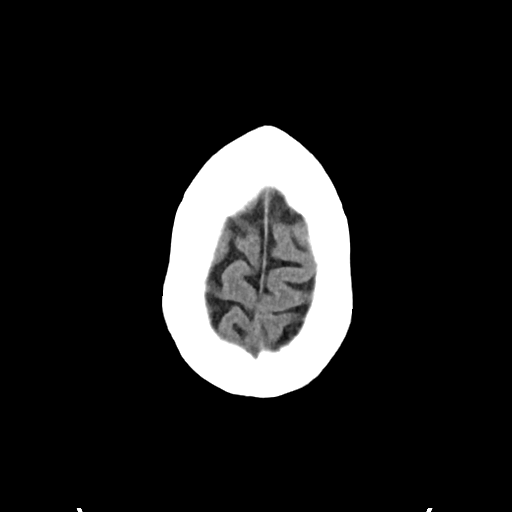

[Series 4: head bone · axial · 0.51mm/px · z∈[+1175,+1239]mm · 4 of 90 slices shown]
[im 9/90  bone]
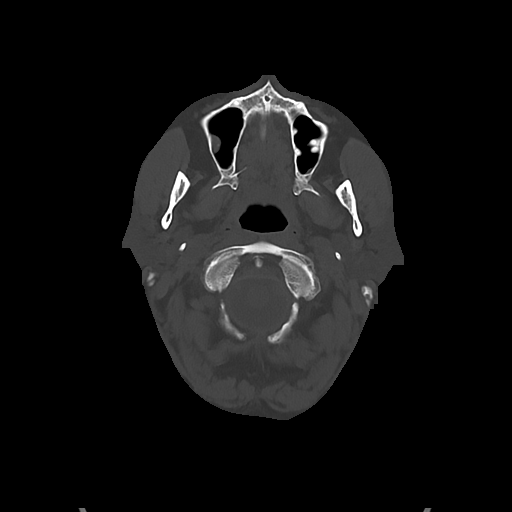
[im 18/90  bone]
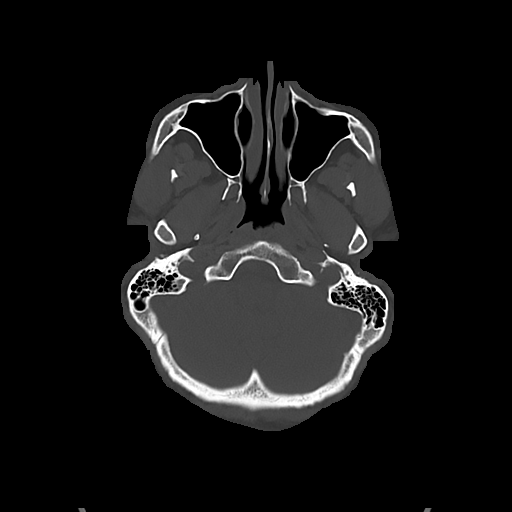
[im 27/90  bone]
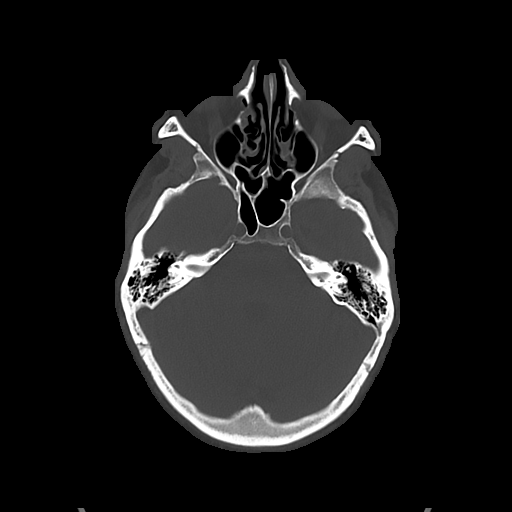
[im 41/90  bone]
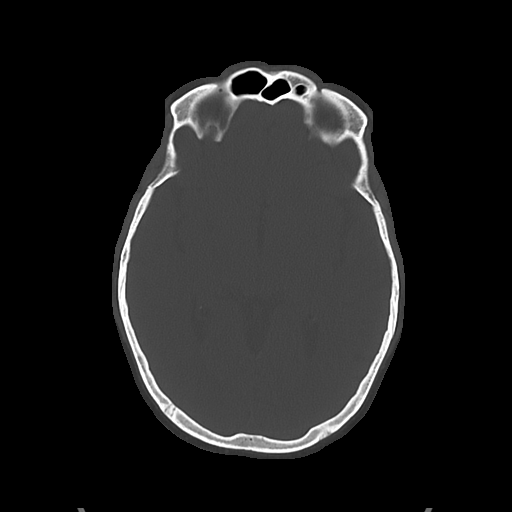

[Series 5: cor soft · coronal · 0.35mm/px · 3 of 80 slices shown]
[im 27/80  brain]
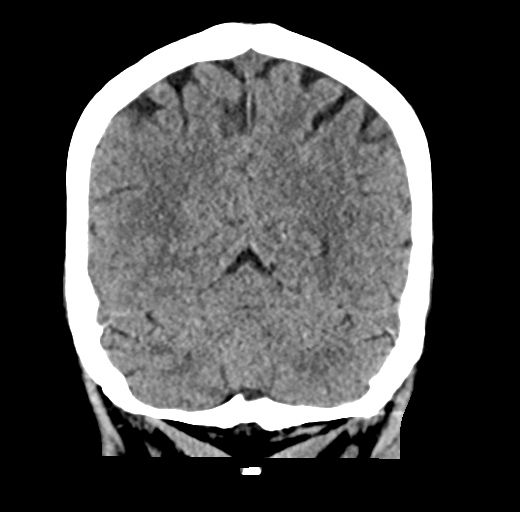
[im 36/80  brain]
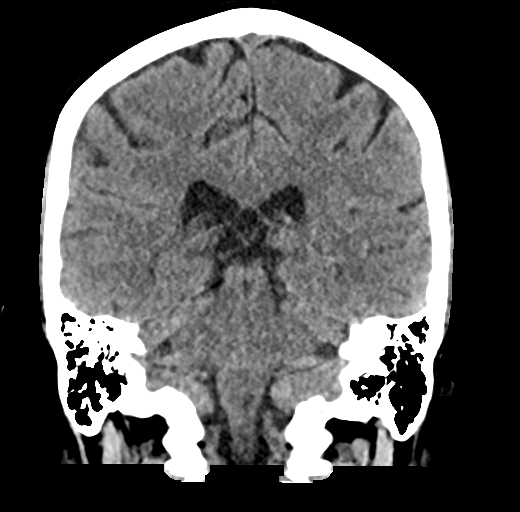
[im 44/80  brain]
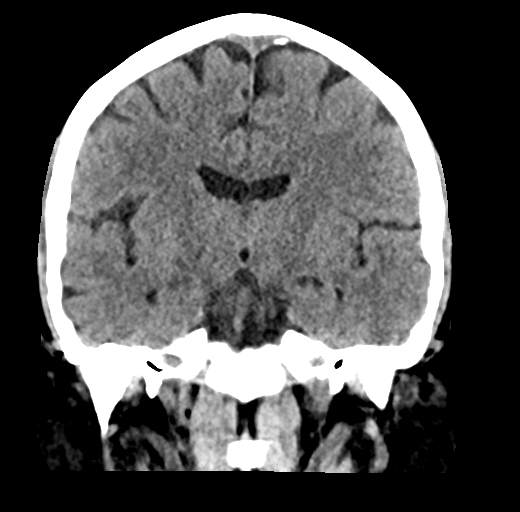

[Series 6: sag soft · sagittal · 0.35mm/px · 3 of 62 slices shown]
[im 21/62  brain]
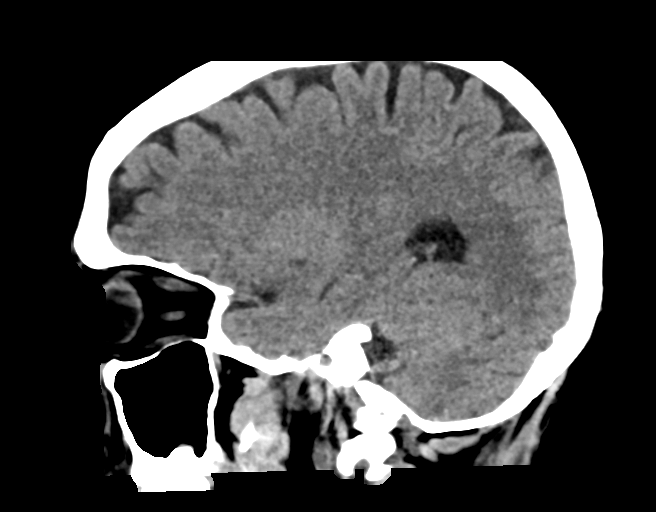
[im 31/62  brain]
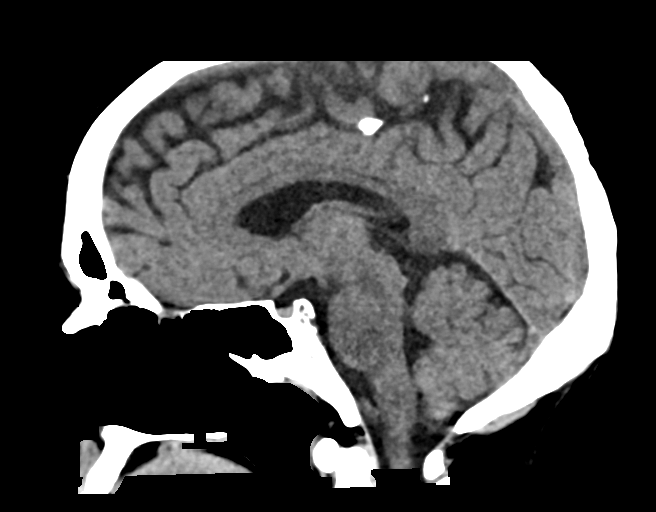
[im 41/62  brain]
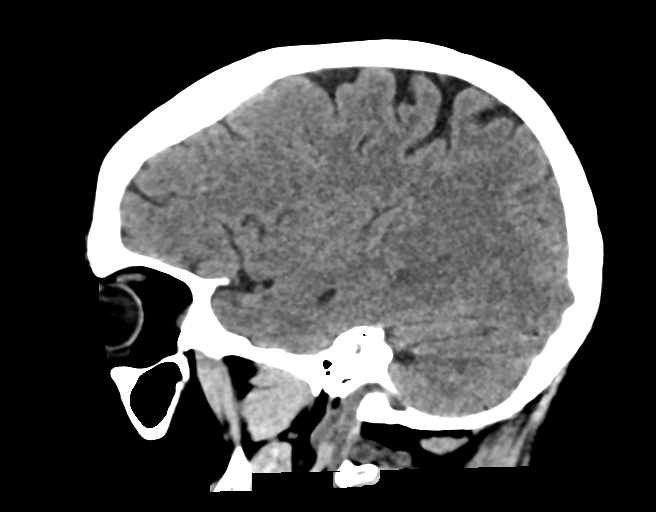

[17 of 47 positions shown; findings below may reference images not displayed]

FINDINGS: Brain: No evidence of acute infarction, hemorrhage, hydrocephalus,
extra-axial collection or mass lesion/mass effect. There is mild
atrophy, greater than expected for the patient's age.

Vascular: No hyperdense vessel or unexpected calcification.

Skull: There may be some mild left frontal scalp swelling. No
evidence for an underlying calvarial fracture.

Sinuses/Orbits: Small mucosal retention cysts are noted within the
bilateral maxillary sinuses. There is mucosal thickening of the
ethmoid air cells. The remaining paranasal sinuses and mastoid air
cells are essentially clear.

Other: None.
IMPRESSION: 1. No acute intracranial abnormality.
2. Atrophy, greater than expected for the patient's age.

## 2019-02-10 MED ORDER — SODIUM CHLORIDE 0.9 % IV SOLN
80.0000 mg | Freq: Once | INTRAVENOUS | Status: AC
  Administered 2019-02-10: 80 mg via INTRAVENOUS
  Filled 2019-02-10: qty 80

## 2019-02-10 MED ORDER — SODIUM CHLORIDE 0.9 % IV SOLN
10.0000 mL/h | Freq: Once | INTRAVENOUS | Status: AC
  Administered 2019-02-10: 10 mL/h via INTRAVENOUS

## 2019-02-10 MED ORDER — METOCLOPRAMIDE HCL 5 MG/ML IJ SOLN
10.0000 mg | Freq: Once | INTRAMUSCULAR | Status: AC
  Administered 2019-02-10: 10 mg via INTRAVENOUS
  Filled 2019-02-10: qty 2

## 2019-02-10 MED ORDER — METRONIDAZOLE IN NACL 5-0.79 MG/ML-% IV SOLN
500.0000 mg | Freq: Once | INTRAVENOUS | Status: AC
  Administered 2019-02-10: 500 mg via INTRAVENOUS
  Filled 2019-02-10: qty 100

## 2019-02-10 MED ORDER — SODIUM CHLORIDE 0.9 % IV SOLN
8.0000 mg/h | INTRAVENOUS | Status: AC
  Administered 2019-02-11 – 2019-02-13 (×4): 8 mg/h via INTRAVENOUS
  Filled 2019-02-10 (×7): qty 80

## 2019-02-10 MED ORDER — SODIUM CHLORIDE 0.9 % IV SOLN
2.0000 g | Freq: Once | INTRAVENOUS | Status: AC
  Administered 2019-02-10: 2 g via INTRAVENOUS
  Filled 2019-02-10: qty 20

## 2019-02-10 MED ORDER — IOHEXOL 300 MG/ML  SOLN
100.0000 mL | Freq: Once | INTRAMUSCULAR | Status: AC | PRN
  Administered 2019-02-10: 100 mL via INTRAVENOUS

## 2019-02-10 MED ORDER — VITAMIN K1 10 MG/ML IJ SOLN
5.0000 mg | Freq: Once | INTRAVENOUS | Status: AC
  Administered 2019-02-11: 5 mg via INTRAVENOUS
  Filled 2019-02-10: qty 0.5

## 2019-02-10 MED ORDER — LACTATED RINGERS IV BOLUS (SEPSIS)
500.0000 mL | Freq: Once | INTRAVENOUS | Status: AC
  Administered 2019-02-10: 500 mL via INTRAVENOUS

## 2019-02-10 MED ORDER — LACTATED RINGERS IV BOLUS (SEPSIS)
1000.0000 mL | Freq: Once | INTRAVENOUS | Status: AC
  Administered 2019-02-10: 1000 mL via INTRAVENOUS

## 2019-02-10 MED ORDER — SODIUM CHLORIDE 0.9 % IV SOLN: 10.0000 mL/h | Freq: Once | INTRAVENOUS | Status: DC

## 2019-02-10 NOTE — ED Provider Notes (Signed)
[facility] EMERGENCY DEPARTMENT Provider Note   CSN: [id number] Arrival date & time: 02/10/19  2049     History Chief Complaint  Patient presents with  . GI Bleeding    Joshua Hubbard is a 44 y.o. male.  Joshua Hubbard is a 44 y.o. male with a history of alcoholic liver cirrhosis, previous lower GI bleed, thrombocytopenia, who presents to the emergency department via EMS for evaluation of diarrhea, syncopal episode and hematemesis.  Patient reports symptoms began last night when he started having numerous episodes of frequent diarrhea, this kept him up throughout the night, he has felt nauseated but did not have vomiting until this afternoon.  Patient states that he has had more than 20 bowel movements since last night but has not noticed bright red blood or dark black stools.  States that this afternoon when he went to stand up he passed out falling to the ground and striking the right side of his head.  His wife then called EMS and when EMS arrived patient had 1 episode of bright red bloody emesis with some dark coffee grounds material, approximately 150 mL.  Since then he has been nauseated pale and diaphoretic but has not had any additional emesis.  EMS reports when they tried to stand the patient up he had another syncopal episode, he was not hypotensive with EMS but received 1 L IV fluids and blood pressures remained stable, he has been tachycardic.  States he is followed by Dr. [name] with [facility] after his previous GI bleed.  Has history of alcoholic cirrhosis but has not drank alcohol since previous admission in September.  States that he noted chills and fever this morning, T-max of 100.7, diarrhea and nausea but no associated abdominal pain.  No cough, chest pain or shortness of breath.  No known sick contacts.  Has not had COVID-19.  No other aggravating or alleviating factors.        Past Medical History:  Diagnosis Date  . Arthritis   . PONV  (postoperative nausea and vomiting)   . Tears of meniscus and ACL of left knee 03/19/2018    Patient Active Problem List   Diagnosis Date Noted  . Alcohol abuse with unspecified alcohol-induced disorder ([place]) 10/08/2018  . GI bleed 10/05/2018  . Liver cirrhosis ([place]) 10/05/2018  . Alcohol withdrawal ([place]) 10/05/2018  . Hypokalemia 10/05/2018  . Thrombocytopenia ([place]) 10/05/2018  . Tears of meniscus and ACL of left knee 03/19/2018  . Umbilical hernia without obstruction and without gangrene 04/14/2017  . Asymmetrical hearing loss of right ear 03/02/2017  . Tinnitus aurium, right 03/02/2017  . Tinnitus of both ears 02/01/2017  . Hoarseness of voice 02/01/2017  . Acute suppurative otitis media of right ear with spontaneous rupture of tympanic membrane 07/25/2016  . 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 CLOSED DISLOCATION OF TARSAL JOINT UNSPECIFIED 12/25/2007    Past Surgical History:  Procedure Laterality Date  . ARTHROSCOPIC REPAIR ACL     x2  . BIOPSY  10/05/2018   Procedure: BIOPSY;  Surgeon: Ronald Lobo, MD;  Location: [place];  Service: Endoscopy;;  . ESOPHAGOGASTRODUODENOSCOPY (EGD) WITH PROPOFOL N/A 10/05/2018   Procedure: ESOPHAGOGASTRODUODENOSCOPY (EGD) WITH PROPOFOL;  Surgeon: Ronald Lobo, MD;  Location: [place];  Service: Endoscopy;  Laterality: N/A;  . HERNIA REPAIR     x3  . KNEE ARTHROSCOPY WITH ANTERIOR CRUCIATE LIGAMENT (ACL) REPAIR WITH HAMSTRING GRAFT  Left 03/20/2018   Procedure: KNEE ARTHROSCOPY WITH REVESION ANTERIOR CRUCIATE LIGAMENT (ACL) REPAIR WITH  HAMSTRING ALLOGRAFT,;  Surgeon: Elsie Saas, MD;  Location: [place];  Service: Orthopedics;  Laterality: Left;  . KNEE ARTHROSCOPY WITH LATERAL MENISECTOMY Left 03/20/2018   Procedure: KNEE ARTHROSCOPY WITH LATERAL MENISECTOMY;  Surgeon: Elsie Saas, MD;  Location: [place];  Service:  Orthopedics;  Laterality: Left;  . KNEE ARTHROSCOPY WITH MEDIAL MENISECTOMY Left 03/20/2018   Procedure: KNEE ARTHROSCOPY WITH MEDIAL MENISECTOMY;  Surgeon: Elsie Saas, MD;  Location: [place];  Service: Orthopedics;  Laterality: Left;       Family History  Problem Relation Age of Onset  . Osteoarthritis Mother   . Coronary artery disease Father   . Hyperlipidemia Father     Social History   Tobacco Use  . Smoking status: Never Smoker  . Smokeless tobacco: Never Used  Substance Use Topics  . Alcohol use: Yes    Alcohol/week: 42.0 standard drinks    Types: 42 Cans of beer per week  . Drug use: No    Home Medications Prior to Admission medications   Medication Sig Start Date End Date Taking? Authorizing Provider  clonazePAM (KLONOPIN) 1 MG tablet TAKE 1 TABLET AT BEDTIME Patient taking differently: Take 1 mg by mouth at bedtime.  07/02/18   [name], MD  magnesium chloride (SLOW-MAG) 64 MG TBEC SR tablet Take 1 tablet (64 mg total) by mouth 2 (two) times daily. 01/10/19   Dene Gentry, MD  Multiple Vitamin (MULTIVITAMIN WITH MINERALS) TABS tablet Take 1 tablet by mouth daily.    [provider]  pantoprazole (PROTONIX) 40 MG tablet Take 1 tablet (40 mg total) by mouth 2 (two) times daily. 10/07/18 11/06/18  Terrilee Croak, MD  propranolol (INDERAL) 10 MG tablet Take 1 tablet (10 mg total) by mouth 2 (two) times daily. 10/07/18 11/06/18  Terrilee Croak, MD    Allergies    Patient has no known allergies.  Review of Systems   Review of Systems  Constitutional: Positive for chills, fatigue and fever.  HENT: Negative.   Respiratory: Negative for cough and shortness of breath.   Cardiovascular: Negative for chest pain.  Gastrointestinal: Positive for diarrhea, nausea and vomiting. Negative for abdominal pain and blood in stool.  Genitourinary: Negative for dysuria and frequency.  Musculoskeletal: Negative for arthralgias and myalgias.  Skin:  Positive for pallor.  Neurological: Positive for weakness (Generalized). Negative for numbness.    Physical Exam Updated Vital Signs BP 140/73 (BP Location: Left Arm)   Pulse (!) 117   Temp 99.5 F (37.5 C) (Oral)   Resp 19   SpO2 100%   Physical Exam Vitals and nursing note reviewed.  Constitutional:      General: He is not in acute distress.    Appearance: Normal appearance. He is well-developed and normal weight. He is ill-appearing and diaphoretic. He is not toxic-appearing.     Comments: Patient is acutely ill-appearing, pale and diaphoretic, appears anxious as well  HENT:     Head: Normocephalic and atraumatic.     Mouth/Throat:     Comments: Mucous membranes pale Eyes:     General:        Right eye: No discharge.        Left eye: No discharge.     Conjunctiva/sclera: Conjunctivae normal.  Cardiovascular:     Rate and Rhythm: Regular rhythm. Tachycardia present.     Heart sounds: Normal heart  sounds. No murmur. No friction rub. No gallop.      Comments: Tachycardia with regular rhythm Pulmonary:     Effort: Pulmonary effort is normal. No respiratory distress.     Breath sounds: Normal breath sounds. No wheezing or rales.     Comments: Respirations equal and unlabored, patient able to speak in full sentences, lungs clear to auscultation bilaterally Abdominal:     General: Bowel sounds are normal. There is no distension.     Palpations: Abdomen is soft. There is no mass.     Tenderness: There is no abdominal tenderness. There is no guarding.     Comments: Abdomen soft, nondistended, nontender to palpation in all quadrants  Genitourinary:    Comments: Chaperone present during rectal exam. Dark green stool present in the rectal vault, no gross blood Musculoskeletal:        General: No deformity.     Cervical back: Neck supple.  Skin:    General: Skin is warm.     Capillary Refill: Capillary refill takes less than 2 seconds.  Neurological:     Mental Status: He is  alert.     Coordination: Coordination normal.     Comments: Speech is clear, able to follow commands Moves extremities without ataxia, coordination intact  Psychiatric:        Mood and Affect: Mood normal.        Behavior: Behavior normal.     ED Results / Procedures / Treatments   Labs (all labs ordered are listed, but only abnormal results are displayed) Labs Reviewed  COMPREHENSIVE METABOLIC PANEL - Abnormal; Notable for the following components:      Result Value   Potassium 3.2 (*)    CO2 21 (*)    Glucose, Bld 138 (*)    Calcium 7.7 (*)    Total Protein 6.0 (*)    Albumin 2.6 (*)    Total Bilirubin 1.9 (*)    All other components within normal limits  CBC WITH DIFFERENTIAL/PLATELET - Abnormal; Notable for the following components:   WBC 10.6 (*)    RBC 2.37 (*)    Hemoglobin 4.6 (*)    HCT 16.8 (*)    MCV 70.9 (*)    MCH 19.4 (*)    MCHC 27.4 (*)    RDW 21.3 (*)    Platelets 124 (*)    Monocytes Absolute 1.2 (*)    All other components within normal limits  LACTIC ACID, PLASMA - Abnormal; Notable for the following components:   Lactic Acid, Venous 3.8 (*)    All other components within normal limits  PROTIME-INR - Abnormal; Notable for the following components:   Prothrombin Time >90.0 (*)    INR >10.0 (*)    All other components within normal limits  APTT - Abnormal; Notable for the following components:   aPTT >200 (*)    All other components within normal limits  POC OCCULT BLOOD, ED - Abnormal; Notable for the following components:   Fecal Occult Bld POSITIVE (*)    All other components within normal limits  CULTURE, BLOOD (ROUTINE X 2)  URINE CULTURE  GI PATHOGEN PANEL BY PCR, STOOL  CULTURE, BLOOD (ROUTINE X 2) W REFLEX TO ID PANEL  RESPIRATORY PANEL BY RT PCR (FLU A&B, COVID)  LIPASE, BLOOD  LACTIC ACID, PLASMA  URINALYSIS, ROUTINE W REFLEX MICROSCOPIC  POC SARS CORONAVIRUS 2 AG -  ED  TYPE AND SCREEN  PREPARE RBC (CROSSMATCH)    EKG EKG  Interpretation  Date/Time:  Sunday February 10 2019 21:38:18 EST Ventricular Rate:  112 PR Interval:    QRS Duration: 96 QT Interval:  349 QTC Calculation: 477 R Axis:   -36 Text Interpretation: Sinus tachycardia Left axis deviation Abnormal R-wave progression, early transition Borderline prolonged QT interval since last tracing no significant change Confirmed by Noemi Chapel [id number]) on 02/10/2019 9:44:20 PM   Radiology CT Head Wo Contrast  Result Date: 02/10/2019 CLINICAL DATA:  Syncope. EXAM: CT HEAD WITHOUT CONTRAST TECHNIQUE: Contiguous axial images were obtained from the base of the skull through the vertex without intravenous contrast. COMPARISON:  None. FINDINGS: Brain: No evidence of acute infarction, hemorrhage, hydrocephalus, extra-axial collection or mass lesion/mass effect. There is mild atrophy, greater than expected for the patient's age. Vascular: No hyperdense vessel or unexpected calcification. Skull: There may be some mild left frontal scalp swelling. No evidence for an underlying calvarial fracture. Sinuses/Orbits: Small mucosal retention cysts are noted within the bilateral maxillary sinuses. There is mucosal thickening of the ethmoid air cells. The remaining paranasal sinuses and mastoid air cells are essentially clear. Other: None. IMPRESSION: 1. No acute intracranial abnormality. 2. Atrophy, greater than expected for the patient's age. Electronically Signed   By: Constance Holster M.D.   On: 02/10/2019 21:43   CT ABDOMEN PELVIS W CONTRAST  Result Date: 02/11/2019 CLINICAL DATA:  Fever all.  Diarrhea.  Active GI bleeding. EXAM: CT ABDOMEN AND PELVIS WITH CONTRAST TECHNIQUE: Multidetector CT imaging of the abdomen and pelvis was performed using the standard protocol following bolus administration of intravenous contrast. CONTRAST:  131mL OMNIPAQUE IOHEXOL 300 MG/ML  SOLN COMPARISON:  10/04/2018 FINDINGS: Lower chest: The lung bases are clear. The heart size is normal. There is  a probable pericardial cyst, stable from prior study. Hepatobiliary: Again noted is severe hepatic steatosis with findings of cirrhosis. There is a stable cystic appearing lesion involving the right hepatic lobe. There is a stable cystic lesion involving the anterior left hepatic lobe (axial series 3, image 36). Pancreas: Peripancreatic fat stranding is noted. Spleen: The spleen is significantly enlarged. Adrenals/Urinary Tract: --Adrenal glands: No adrenal hemorrhage. --Right kidney/ureter: No hydronephrosis or perinephric hematoma. --Left kidney/ureter: No hydronephrosis or perinephric hematoma. --Urinary bladder: Unremarkable. Stomach/Bowel: --Stomach/Duodenum: There is a small hiatal hernia. --Small bowel: No dilatation or inflammation. --Colon: There may be some mild wall thickening of the splenic flexure of the colon. There may be some mild wall thickening at the hepatic flexure of the colon as well as the cecum and ascending colon. --Appendix: Normal. Vascular/Lymphatic: Atherosclerotic calcification is present within the non-aneurysmal abdominal aorta, without hemodynamically significant stenosis. The portal vein remains patent. Esophageal varices are suspected. Portosystemic shunting is noted consistent with portal hypertension. --there is shotty retroperitoneal adenopathy. --mild mesenteric adenopathy is noted --No pelvic or inguinal lymphadenopathy. Reproductive: Unremarkable Other: The patient is status post prior ventral hernia repair. There is a small volume of free fluid in the patient's abdomen. There is no free air. Mesenteric edema is noted. Musculoskeletal. There is a bilateral pars defect at L5 with minimal anterolisthesis of L5 on S1. IMPRESSION: 1. Cirrhosis with stigmata of portal hypertension as detailed above. 2. Peripancreatic free fluid in fat stranding. Correlation with lipase is recommended to help exclude underlying pancreatitis. 3. Wall thickening of significant portions of the colon.  Findings may be secondary to a diffuse infectious or inflammatory colitis. Portal hypertensive colopathy is also within the differential. 4. Scattered retroperitoneal and mesenteric adenopathy, presumably reactive. Electronically Signed   By: Harrell Gave  Green M.D.   On: 02/11/2019 00:17   DG Chest Port 1 View  Result Date: 02/10/2019 CLINICAL DATA:  Sepsis EXAM: PORTABLE CHEST 1 VIEW COMPARISON:  Radiograph 07/31/2018 FINDINGS: Lung volumes are low with some streaky atelectasis. No consolidation, features of edema, pneumothorax, or effusion. The cardiomediastinal contours are unremarkable. No acute osseous or soft tissue abnormality. IMPRESSION: Low lung volumes. No focal consolidative opacity or other acute cardiopulmonary abnormality. Electronically Signed   By: Lovena Le M.D.   On: 02/10/2019 21:29    Procedures .Critical Care Performed by: Jacqlyn Larsen, PA-C Authorized by: Jacqlyn Larsen, PA-C   Critical care provider statement:    Critical care time (minutes):  45   Critical care was necessary to treat or prevent imminent or life-threatening deterioration of the following conditions:  Circulatory failure and sepsis (GI bleed with hemoglobin of 4.5, INR greater than 10, also septic)   Critical care was time spent personally by me on the following activities:  Discussions with consultants, evaluation of patient's response to treatment, examination of patient, ordering and performing treatments and interventions, ordering and review of laboratory studies, ordering and review of radiographic studies, pulse oximetry, re-evaluation of patient's condition, obtaining history from patient or surrogate and review of old charts   (including critical care time)  Medications Ordered in ED Medications  phytonadione (VITAMIN K) 5 mg in dextrose 5 % 50 mL IVPB (has no administration in time range)  pantoprazole (PROTONIX) 80 mg in sodium chloride 0.9 % 250 mL (0.32 mg/mL) infusion (has no  administration in time range)  0.9 %  sodium chloride infusion (has no administration in time range)  cefTRIAXone (ROCEPHIN) 2 g in sodium chloride 0.9 % 100 mL IVPB (0 g Intravenous Stopped 02/10/19 2225)  metroNIDAZOLE (FLAGYL) IVPB 500 mg (0 mg Intravenous Stopped 02/10/19 2250)  pantoprazole (PROTONIX) 80 mg in sodium chloride 0.9 % 100 mL IVPB (0 mg Intravenous Stopped 02/10/19 2322)  lactated ringers bolus 1,000 mL (0 mLs Intravenous Stopped 02/10/19 2238)    And  lactated ringers bolus 1,000 mL (0 mLs Intravenous Stopped 02/10/19 2255)    And  lactated ringers bolus 500 mL (500 mLs Intravenous New Bag/Given 02/10/19 2305)  metoCLOPramide (REGLAN) injection 10 mg (10 mg Intravenous Given 02/10/19 2214)  0.9 %  sodium chloride infusion (10 mL/hr Intravenous New Bag/Given 02/10/19 2303)  iohexol (OMNIPAQUE) 300 MG/ML solution 100 mL (100 mLs Intravenous Contrast Given 02/10/19 2350)    ED Course  I have reviewed the triage vital signs and the nursing notes.  Pertinent labs & imaging results that were available during my care of the patient were reviewed by me and considered in my medical decision making (see chart for details).  Clinical Course as of Feb 11 39  Sun Feb 10, 7632  8231 44 year old male arrives via EMS after syncopal episode where patient struck his head at home, has had severe diarrhea since last night without noted blood, and then had one episode of bright red bloody emesis.  Patient was syncopal with standing with EMS.  History of liver cirrhosis, but no known history of varices, has not had any alcohol in a few months.  Also noted fever starting this morning with chills, no focal abdominal pain, no associated cough, chest pain or shortness of breath   [KF]  2100 Presentation concerning for GI bleed but with fever also concern for sepsis.  On arrival patient is tachycardic, blood pressure stable.  Code sepsis initiated with antibiotics for likely  GI source.   [KF]  2141 No  gross blood noted on rectal exam  Fecal Occult Blood, POC(!): POSITIVE [KF]  2200 Hemoglobin(!!): 4.6 [KF]  2235 Lactic Acid, Venous(!!): 3.8 [KF]  2252 APTT(!!): >200 [KF]  2258 INR(!!): >10.0 [KF]  Mon Feb 11, 2019  0000 Have not received call back from Dr. Michail Sermon with [facility] yet, but will go ahead and consult for medicine admission   [KF]  0027 CT shows cirrhosis with stigmata of portal hypertension, there is some peripancreatic fluid and fat stranding but no elevation in lipase to suggest pancreatitis.Wall thickening of significant portions of the colon could be secondary to diffuse infectious or inflammatory colitis or portal hypertensive colopathy  CT ABDOMEN PELVIS W CONTRAST [KF]  0041 Case discussed with Dr. Marlowe Sax with [facility] who will see and admit the patient. PA Charlann Lange will follow up on GI recommendations   [KF]    Clinical Course User Index [KF] Janet Berlin   MDM Rules/Calculators/A&P                      44 year old male arrives critically ill with severe diarrhea and episode of bloody emesis, syncopal with EMS concerning for GI bleed but also with fevers and tachycardia concerning for sepsis.  Code sepsis initiated as well as lab work for GI bleed, patient started on empiric antibiotics, IV fluid bolus, and Protonix.  Critically low hemoglobin of 4.6, 2 units of RBCs transfused.  Patient also found to have INR greater than 10 likely in the setting of chronic liver disease.  Patient given vitamin K and FFP.  Sepsis likely due to intra-abdominal source, patient started on empiric Rocephin and Flagyl, he has not had cough, chest pain or urinary symptoms, chest x-ray without pneumonia, rapid Covid test negative, confirmatory PCR is pending  Patient has not had any additional episodes of hematemesis.  He has heme positive stools but no gross blood on exam.  He does not have elevated BUN to creatinine ratio to suggest significant upper GI  bleed.  Will consult GI, but given that patient has not had additional episodes of hematemesis do not think that he needs emergent endoscopy or octreotide, blood pressures have remained stable.  CT consistent with cirrhosis with stigmata of portal hypertension, wall thickening throughout portions of the colon that could explain bleeding, could be portal hypertensive colopathy, or infectious or inflammatory colitis.  12:15 AM Cardiac monitoring reveals sinus tachycardia with rate of 107, as reviewed and interpreted by me. Cardiac monitoring was ordered due to history of GI bleeding and sepsis and to monitor patient for dysrhythmia.  Have not received a call back from Dr. Michail Sermon with [facility] regarding recommendations, but do not think the patient will require emergent endoscopy as he has not had any additional bleeding episodes here in the ED, tachycardia is improving and he has not become hypotensive.  Case discussed with Dr. Marlowe Sax with [facility] who will see and admit the patient.  Final Clinical Impression(s) / ED Diagnoses Final diagnoses:  Severe anemia  Acute GI bleeding  Chronic liver failure without hepatic coma [facility])    Rx / DC Orders ED Discharge Orders    None       Janet Berlin 02/11/19 0050    Noemi Chapel, MD 02/12/19 [phone]

## 2019-02-10 NOTE — ED Provider Notes (Signed)
Medical screening examination/treatment/procedure(s) were conducted as a shared visit with non-physician practitioner(s) and myself.  I personally evaluated the patient during the encounter.  Clinical Impression:   Final diagnoses:  Severe anemia  Acute GI bleeding  Chronic liver failure without hepatic coma [facility])    Is an ill-appearing 44 year old male with a known history of liver injury from alcohol abuse and cirrhosis.  He has a history of gastrointestinal bleeding and is followed by Dr. [name].  He denies having any history of variceal bleeding.  He states he had a large episode of bright red blood per rectum and has had multiple episodes of diarrhea today as well as a temperature of 100.7 T-max.  He denies any abdominal pain coughing or shortness of breath, he states he did have a syncopal episode when he was standing and became so lightheaded that he fell over striking his head his elbow and his knees.  On my exam the patient has a soft abdomen, increased bowel sounds, he is tachycardic to 120 bpm appear slightly jaundiced, moist mucous membranes, no obvious visible trauma to the arms or the legs which are supple and soft.  The patient will need further evaluation for the cause as he is likely having some internal bleeding gastrointestinal source.  He may also have an infection and with his tachycardia and orthostatics he will need to have further evaluation with cultures urinalysis chest x-ray and coronavirus testing.  The patient is agreeable to this plan, he does appear ill  The patient is extremely pale, his hemoglobin is just over 4, his INR is over 10, this appears to be an auto coagulopathy given his underlying liver dysfunction.  He has been given vitamin K, he will be transfused 2 units and we will consult with gastro.  He will need to be admitted to the hospital.  He is critically ill.   Noemi Chapel, MD 02/12/19 [phone]

## 2019-02-10 NOTE — ED Triage Notes (Signed)
Pt reports having a syncopal episode when he stood up from his bed at home, reports he did hit his head during episode. Reports positive LOC, bloody emesis, bright red.

## 2019-02-10 NOTE — ED Notes (Signed)
Date and time results received: 02/10/19 2051 (use smartphrase ".now" to insert current time)  Test: PTT Critical Value: <200  Name of Provider Notified: Benedetto Goad  Orders Received? Or Actions Taken?: Orders Received - See Orders for details

## 2019-02-10 NOTE — ED Notes (Signed)
Date and time results received: 02/10/19 2045 (use smartphrase ".now" to insert current time)  Test: INR Critical Value: <10  Name of Provider Notified: Benedetto Goad  Orders Received? Or Actions Taken?: Orders Received - See Orders for details

## 2019-02-11 ENCOUNTER — Other Ambulatory Visit: Payer: Self-pay

## 2019-02-11 DIAGNOSIS — A419 Sepsis, unspecified organism: Secondary | ICD-10-CM

## 2019-02-11 DIAGNOSIS — D684 Acquired coagulation factor deficiency: Secondary | ICD-10-CM | POA: Diagnosis present

## 2019-02-11 DIAGNOSIS — K529 Noninfective gastroenteritis and colitis, unspecified: Secondary | ICD-10-CM

## 2019-02-11 DIAGNOSIS — Z20822 Contact with and (suspected) exposure to covid-19: Secondary | ICD-10-CM | POA: Diagnosis present

## 2019-02-11 DIAGNOSIS — D689 Coagulation defect, unspecified: Secondary | ICD-10-CM

## 2019-02-11 DIAGNOSIS — D696 Thrombocytopenia, unspecified: Secondary | ICD-10-CM | POA: Diagnosis not present

## 2019-02-11 DIAGNOSIS — K922 Gastrointestinal hemorrhage, unspecified: Secondary | ICD-10-CM | POA: Diagnosis present

## 2019-02-11 DIAGNOSIS — K7030 Alcoholic cirrhosis of liver without ascites: Secondary | ICD-10-CM | POA: Diagnosis present

## 2019-02-11 DIAGNOSIS — R197 Diarrhea, unspecified: Secondary | ICD-10-CM | POA: Diagnosis not present

## 2019-02-11 DIAGNOSIS — I8511 Secondary esophageal varices with bleeding: Secondary | ICD-10-CM | POA: Diagnosis present

## 2019-02-11 DIAGNOSIS — Z79899 Other long term (current) drug therapy: Secondary | ICD-10-CM | POA: Diagnosis not present

## 2019-02-11 DIAGNOSIS — K920 Hematemesis: Secondary | ICD-10-CM | POA: Diagnosis not present

## 2019-02-11 DIAGNOSIS — K7460 Unspecified cirrhosis of liver: Secondary | ICD-10-CM | POA: Diagnosis not present

## 2019-02-11 DIAGNOSIS — D6959 Other secondary thrombocytopenia: Secondary | ICD-10-CM | POA: Diagnosis present

## 2019-02-11 DIAGNOSIS — D62 Acute posthemorrhagic anemia: Secondary | ICD-10-CM | POA: Diagnosis not present

## 2019-02-11 DIAGNOSIS — F1011 Alcohol abuse, in remission: Secondary | ICD-10-CM | POA: Diagnosis present

## 2019-02-11 DIAGNOSIS — Z8349 Family history of other endocrine, nutritional and metabolic diseases: Secondary | ICD-10-CM | POA: Diagnosis not present

## 2019-02-11 DIAGNOSIS — Z8249 Family history of ischemic heart disease and other diseases of the circulatory system: Secondary | ICD-10-CM | POA: Diagnosis not present

## 2019-02-11 DIAGNOSIS — F419 Anxiety disorder, unspecified: Secondary | ICD-10-CM | POA: Diagnosis present

## 2019-02-11 DIAGNOSIS — K766 Portal hypertension: Secondary | ICD-10-CM | POA: Diagnosis present

## 2019-02-11 DIAGNOSIS — E871 Hypo-osmolality and hyponatremia: Secondary | ICD-10-CM | POA: Diagnosis not present

## 2019-02-11 DIAGNOSIS — K2970 Gastritis, unspecified, without bleeding: Secondary | ICD-10-CM | POA: Diagnosis present

## 2019-02-11 DIAGNOSIS — G4700 Insomnia, unspecified: Secondary | ICD-10-CM | POA: Diagnosis present

## 2019-02-11 DIAGNOSIS — R451 Restlessness and agitation: Secondary | ICD-10-CM | POA: Diagnosis present

## 2019-02-11 DIAGNOSIS — E876 Hypokalemia: Secondary | ICD-10-CM | POA: Diagnosis not present

## 2019-02-11 DIAGNOSIS — D1809 Hemangioma of other sites: Secondary | ICD-10-CM | POA: Diagnosis present

## 2019-02-11 DIAGNOSIS — K2971 Gastritis, unspecified, with bleeding: Secondary | ICD-10-CM | POA: Diagnosis not present

## 2019-02-11 DIAGNOSIS — I8501 Esophageal varices with bleeding: Secondary | ICD-10-CM | POA: Diagnosis not present

## 2019-02-11 LAB — CBC WITH DIFFERENTIAL/PLATELET
Abs Immature Granulocytes: 0.01 10*3/uL (ref 0.00–0.07)
Basophils Absolute: 0.1 10*3/uL (ref 0.0–0.1)
Basophils Relative: 2 %
Eosinophils Absolute: 0.2 10*3/uL (ref 0.0–0.5)
Eosinophils Relative: 3 %
HCT: 22 % — ABNORMAL LOW (ref 39.0–52.0)
Hemoglobin: 7 g/dL — ABNORMAL LOW (ref 13.0–17.0)
Immature Granulocytes: 0 %
Lymphocytes Relative: 29 %
Lymphs Abs: 1.6 10*3/uL (ref 0.7–4.0)
MCH: 24.8 pg — ABNORMAL LOW (ref 26.0–34.0)
MCHC: 31.8 g/dL (ref 30.0–36.0)
MCV: 78 fL — ABNORMAL LOW (ref 80.0–100.0)
Monocytes Absolute: 0.6 10*3/uL (ref 0.1–1.0)
Monocytes Relative: 11 %
Neutro Abs: 3 10*3/uL (ref 1.7–7.7)
Neutrophils Relative %: 55 %
Platelets: 87 10*3/uL — ABNORMAL LOW (ref 150–400)
RBC: 2.82 MIL/uL — ABNORMAL LOW (ref 4.22–5.81)
RDW: 21.7 % — ABNORMAL HIGH (ref 11.5–15.5)
WBC: 5.4 10*3/uL (ref 4.0–10.5)
nRBC: 0 % (ref 0.0–0.2)

## 2019-02-11 LAB — RESPIRATORY PANEL BY RT PCR (FLU A&B, COVID)
Influenza A by PCR: NEGATIVE
Influenza B by PCR: NEGATIVE
SARS Coronavirus 2 by RT PCR: NEGATIVE

## 2019-02-11 LAB — ACETAMINOPHEN LEVEL: Acetaminophen (Tylenol), Serum: <10 ug/mL — ABNORMAL LOW (ref 10–30)

## 2019-02-11 LAB — MAGNESIUM: Magnesium: 0.9 mg/dL — CL (ref 1.7–2.4)

## 2019-02-11 LAB — PROTIME-INR
INR: 3.1 — ABNORMAL HIGH (ref 0.8–1.2)
Prothrombin Time: 31.9 seconds — ABNORMAL HIGH (ref 11.4–15.2)

## 2019-02-11 LAB — PREPARE RBC (CROSSMATCH)

## 2019-02-11 LAB — PHOSPHORUS: Phosphorus: 2.3 mg/dL — ABNORMAL LOW (ref 2.5–4.6)

## 2019-02-11 LAB — C DIFFICILE QUICK SCREEN W PCR REFLEX
C Diff antigen: NEGATIVE
C Diff interpretation: NOT DETECTED
C Diff toxin: NEGATIVE

## 2019-02-11 MED ORDER — SODIUM CHLORIDE 0.9 % IV SOLN
2.0000 g | INTRAVENOUS | Status: DC
  Administered 2019-02-11: 2 g via INTRAVENOUS
  Filled 2019-02-11 (×2): qty 20

## 2019-02-11 MED ORDER — ADULT MULTIVITAMIN W/MINERALS CH
1.0000 | ORAL_TABLET | Freq: Every day | ORAL | Status: DC
  Administered 2019-02-11 – 2019-02-15 (×4): 1 via ORAL
  Filled 2019-02-11 (×4): qty 1

## 2019-02-11 MED ORDER — SODIUM CHLORIDE 0.9 % IV SOLN: INTRAVENOUS | Status: AC

## 2019-02-11 MED ORDER — SODIUM CHLORIDE 0.9 % IV SOLN
50.0000 ug/h | INTRAVENOUS | Status: DC
  Administered 2019-02-11 – 2019-02-14 (×6): 50 ug/h via INTRAVENOUS
  Filled 2019-02-11 (×8): qty 1

## 2019-02-11 MED ORDER — SODIUM CHLORIDE 0.9% IV SOLUTION: Freq: Once | INTRAVENOUS | Status: AC

## 2019-02-11 MED ORDER — VITAMIN K1 10 MG/ML IJ SOLN
10.0000 mg | Freq: Once | INTRAVENOUS | Status: AC
  Administered 2019-02-11: 10 mg via INTRAVENOUS
  Filled 2019-02-11: qty 1

## 2019-02-11 MED ORDER — METRONIDAZOLE IN NACL 5-0.79 MG/ML-% IV SOLN
500.0000 mg | Freq: Three times a day (TID) | INTRAVENOUS | Status: DC
  Administered 2019-02-11 – 2019-02-12 (×5): 500 mg via INTRAVENOUS
  Filled 2019-02-11 (×5): qty 100

## 2019-02-11 MED ORDER — POTASSIUM CHLORIDE 10 MEQ/100ML IV SOLN
10.0000 meq | INTRAVENOUS | Status: AC
  Administered 2019-02-11 (×3): 10 meq via INTRAVENOUS
  Filled 2019-02-11 (×3): qty 100

## 2019-02-11 MED ORDER — THIAMINE HCL 100 MG/ML IJ SOLN
100.0000 mg | Freq: Every day | INTRAMUSCULAR | Status: DC
  Administered 2019-02-12: 100 mg via INTRAVENOUS
  Filled 2019-02-11: qty 2

## 2019-02-11 MED ORDER — THIAMINE HCL 100 MG PO TABS
100.0000 mg | ORAL_TABLET | Freq: Every day | ORAL | Status: DC
  Administered 2019-02-11 – 2019-02-15 (×4): 100 mg via ORAL
  Filled 2019-02-11 (×4): qty 1

## 2019-02-11 MED ORDER — LORAZEPAM 1 MG PO TABS
1.0000 mg | ORAL_TABLET | ORAL | Status: AC | PRN
  Administered 2019-02-11 – 2019-02-13 (×11): 1 mg via ORAL
  Administered 2019-02-13: 2 mg via ORAL
  Administered 2019-02-13 (×3): 1 mg via ORAL
  Administered 2019-02-13: 2 mg via ORAL
  Administered 2019-02-13: 1 mg via ORAL
  Filled 2019-02-11 (×3): qty 1
  Filled 2019-02-11 (×2): qty 2
  Filled 2019-02-11 (×12): qty 1

## 2019-02-11 MED ORDER — FOLIC ACID 1 MG PO TABS
1.0000 mg | ORAL_TABLET | Freq: Every day | ORAL | Status: DC
  Administered 2019-02-11 – 2019-02-15 (×4): 1 mg via ORAL
  Filled 2019-02-11 (×5): qty 1

## 2019-02-11 MED ORDER — ONDANSETRON HCL 4 MG/2ML IJ SOLN: 4.0000 mg | Freq: Four times a day (QID) | INTRAMUSCULAR | Status: DC | PRN

## 2019-02-11 MED ORDER — LORAZEPAM 2 MG/ML IJ SOLN
1.0000 mg | INTRAMUSCULAR | Status: AC | PRN
  Administered 2019-02-11 – 2019-02-12 (×4): 1 mg via INTRAVENOUS
  Filled 2019-02-11 (×4): qty 1

## 2019-02-11 MED ORDER — DIPHENHYDRAMINE HCL 25 MG PO CAPS
25.0000 mg | ORAL_CAPSULE | Freq: Once | ORAL | Status: AC
  Administered 2019-02-11: 25 mg via ORAL
  Filled 2019-02-11: qty 1

## 2019-02-11 MED ORDER — FUROSEMIDE 10 MG/ML IJ SOLN
20.0000 mg | Freq: Once | INTRAMUSCULAR | Status: AC
  Administered 2019-02-11: 20 mg via INTRAVENOUS
  Filled 2019-02-11: qty 2

## 2019-02-11 NOTE — ED Notes (Signed)
Pt resting in bed. On call NP paged and told this RN to let her know if the patient begins to bleed anymore.

## 2019-02-11 NOTE — H&P (Signed)
History and Physical    DAISY ATTWOOD [id number] DOB: June 28, 1975 DOA: 02/10/2019  PCP: [name], MD Patient coming from: Home  Chief Complaint: Syncope, hematemesis  HPI: Joshua Hubbard is a 44 y.o. male with medical history significant of alcoholic liver cirrhosis, previous GI bleed, thrombocytopenia presenting to the ED via EMS for evaluation of syncope and hematemesis.  When EMS arrived, patient had one episode of bright red bloody emesis with some dark coffee-ground material, approximately 150 cc.  When they tried to stand him up, patient had another syncopal episode.  Not hypotensive but was tachycardic and given 1 L IV fluid.  Patient states he has been having a lot of dark green-colored diarrhea for the past 2 days.  Last night when he went to the bathroom he felt dizzy, diaphoretic, and blacked out.  His wife called EMS.  When EMS arrived, he threw up blood.  Denies chest pain or abdominal pain.  States he had another episode of GI bleed back in September 2020 and since then has stopped drinking alcohol.  His gastroenterologist is Dr. [name] with [facility].  He is requesting a medication for anxiety.  ED Course: Afebrile.  Tachycardic.  Blood pressure stable.  WBC count 10.6.  Lactic acid 3.8.  Hemoglobin 4.6, previously in the 9 range.  FOBT positive.  Platelet count 124,000, improved compared to prior labs.  Potassium 3.2.  T bili 1.9, improved compared to prior labs.  Remainder of LFTs normal.  Lipase normal.  INR >10.  UA not suggestive of infection.  Urine culture pending.  Blood culture x2 pending.  SARS-CoV-2 rapid antigen and PCR test negative.  GI pathogen panel pending. Chest x-ray not suggestive of pneumonia. Head CT negative for acute intracranial bleed. CT abdomen pelvis showing wall thickening of significant portions of the colon concerning for infectious or inflammatory colitis. ED provider discussed the case with Dr. Michail Sermon from GI, will consult in a.m. Patient  received ceftriaxone, metronidazole, and 2.5 L IV fluid boluses.  IV PPI bolus and infusion, IV vitamin K, FFP, and 2 units RBCs.  Review of Systems:  All systems reviewed and apart from history of presenting illness, are negative.  Past Medical History:  Diagnosis Date  . Arthritis   . PONV (postoperative nausea and vomiting)   . Tears of meniscus and ACL of left knee 03/19/2018    Past Surgical History:  Procedure Laterality Date  . ARTHROSCOPIC REPAIR ACL     x2  . BIOPSY  10/05/2018   Procedure: BIOPSY;  Surgeon: Ronald Lobo, MD;  Location: [place];  Service: Endoscopy;;  . ESOPHAGOGASTRODUODENOSCOPY (EGD) WITH PROPOFOL N/A 10/05/2018   Procedure: ESOPHAGOGASTRODUODENOSCOPY (EGD) WITH PROPOFOL;  Surgeon: Ronald Lobo, MD;  Location: [place];  Service: Endoscopy;  Laterality: N/A;  . HERNIA REPAIR     x3  . KNEE ARTHROSCOPY WITH ANTERIOR CRUCIATE LIGAMENT (ACL) REPAIR WITH HAMSTRING GRAFT Left 03/20/2018   Procedure: KNEE ARTHROSCOPY WITH REVESION ANTERIOR CRUCIATE LIGAMENT (ACL) REPAIR WITH  HAMSTRING ALLOGRAFT,;  Surgeon: Elsie Saas, MD;  Location: [place];  Service: Orthopedics;  Laterality: Left;  . KNEE ARTHROSCOPY WITH LATERAL MENISECTOMY Left 03/20/2018   Procedure: KNEE ARTHROSCOPY WITH LATERAL MENISECTOMY;  Surgeon: Elsie Saas, MD;  Location: [place];  Service: Orthopedics;  Laterality: Left;  . KNEE ARTHROSCOPY WITH MEDIAL MENISECTOMY Left 03/20/2018   Procedure: KNEE ARTHROSCOPY WITH MEDIAL MENISECTOMY;  Surgeon: Elsie Saas, MD;  Location: [place];  Service: Orthopedics;  Laterality: Left;     reports that he has never smoked. He has never used smokeless tobacco. He reports current alcohol use of about 42.0 standard drinks of alcohol per week. He reports that he does not use drugs.  No Known Allergies  Family History  Problem Relation Age of Onset  . Osteoarthritis Mother   . Coronary artery  disease Father   . Hyperlipidemia Father     Prior to Admission medications   Medication Sig Start Date End Date Taking? Authorizing Provider  clonazePAM (KLONOPIN) 1 MG tablet TAKE 1 TABLET AT BEDTIME Patient taking differently: Take 1 mg by mouth at bedtime.  07/02/18   [name], MD  magnesium chloride (SLOW-MAG) 64 MG TBEC SR tablet Take 1 tablet (64 mg total) by mouth 2 (two) times daily. 01/10/19   Dene Gentry, MD  Multiple Vitamin (MULTIVITAMIN WITH MINERALS) TABS tablet Take 1 tablet by mouth daily.    [provider]  pantoprazole (PROTONIX) 40 MG tablet Take 1 tablet (40 mg total) by mouth 2 (two) times daily. 10/07/18 11/06/18  Terrilee Croak, MD  propranolol (INDERAL) 10 MG tablet Take 1 tablet (10 mg total) by mouth 2 (two) times daily. 10/07/18 11/06/18  Terrilee Croak, MD    Physical Exam: Vitals:   02/11/19 0103 02/11/19 0125 02/11/19 0130 02/11/19 0140  BP: 136/66 106/70 106/70 117/65  Pulse: (!) 121 (!) 124 (!) 124 (!) 122  Resp: 18 19 19 16   Temp: 98.3 F (36.8 C) 99.2 F (37.3 C) 99.2 F (37.3 C) 99.1 F (37.3 C)  TempSrc: Oral Oral Oral Oral  SpO2:  100%  100%  Weight:      Height:        Physical Exam  Constitutional: He is oriented to person, place, and time. He appears well-developed and well-nourished. No distress.  HENT:  Head: Normocephalic.  Eyes: Right eye exhibits no discharge. Left eye exhibits no discharge.  Cardiovascular: Regular rhythm and intact distal pulses.  Slightly tachycardic with heart rate in the 110s  Pulmonary/Chest: Effort normal and breath sounds normal. No respiratory distress. He has no wheezes. He has no rales.  Abdominal: Soft. Bowel sounds are normal. He exhibits no distension. There is no abdominal tenderness. There is no guarding.  Musculoskeletal:        General: No edema.     Cervical back: Neck supple.  Neurological: He is alert and oriented to person, place, and time.  Skin: Skin is warm and dry. He is  not diaphoretic.   Labs on Admission: I have personally reviewed following labs and imaging studies  CBC: Recent Labs  Lab 02/10/19 2108  WBC 10.6*  NEUTROABS 7.6  HGB 4.6*  HCT 16.8*  MCV 70.9*  PLT [id number]*   Basic Metabolic Panel: Recent Labs  Lab 02/10/19 2108  NA 136  K 3.2*  CL 104  CO2 21*  GLUCOSE 138*  BUN 20  CREATININE 0.77  CALCIUM 7.7*   GFR: Estimated Creatinine Clearance: 117.8 mL/min (by C-G formula based on SCr of 0.77 mg/dL). Liver Function Tests: Recent Labs  Lab 02/10/19 2108  AST 34  ALT 18  ALKPHOS 47  BILITOT 1.9*  PROT 6.0*  ALBUMIN 2.6*   Recent Labs  Lab 02/10/19 2108  LIPASE 42   No results for input(s): AMMONIA in the last 168 hours. Coagulation Profile: Recent Labs  Lab 02/10/19 2120  INR >10.0*   Cardiac Enzymes: No results for input(s): CKTOTAL, CKMB, CKMBINDEX, TROPONINI in the  last 168 hours. BNP (last 3 results) No results for input(s): PROBNP in the last 8760 hours. HbA1C: No results for input(s): HGBA1C in the last 72 hours. CBG: No results for input(s): GLUCAP in the last 168 hours. Lipid Profile: No results for input(s): CHOL, HDL, LDLCALC, TRIG, CHOLHDL, LDLDIRECT in the last 72 hours. Thyroid Function Tests: No results for input(s): TSH, T4TOTAL, FREET4, T3FREE, THYROIDAB in the last 72 hours. Anemia Panel: No results for input(s): VITAMINB12, FOLATE, FERRITIN, TIBC, IRON, RETICCTPCT in the last 72 hours. Urine analysis:    Component Value Date/Time   COLORURINE YELLOW 02/10/2019 2322   APPEARANCEUR CLEAR 02/10/2019 2322   LABSPEC 1.023 02/10/2019 2322   PHURINE 6.0 02/10/2019 [place] 02/10/2019 2322   HGBUR NEGATIVE 02/10/2019 2322   BILIRUBINUR NEGATIVE 02/10/2019 2322   KETONESUR 5 (A) 02/10/2019 2322   PROTEINUR NEGATIVE 02/10/2019 2322   NITRITE NEGATIVE 02/10/2019 2322   LEUKOCYTESUR NEGATIVE 02/10/2019 2322    Radiological Exams on Admission: CT Head Wo Contrast  Result  Date: 02/10/2019 CLINICAL DATA:  Syncope. EXAM: CT HEAD WITHOUT CONTRAST TECHNIQUE: Contiguous axial images were obtained from the base of the skull through the vertex without intravenous contrast. COMPARISON:  None. FINDINGS: Brain: No evidence of acute infarction, hemorrhage, hydrocephalus, extra-axial collection or mass lesion/mass effect. There is mild atrophy, greater than expected for the patient's age. Vascular: No hyperdense vessel or unexpected calcification. Skull: There may be some mild left frontal scalp swelling. No evidence for an underlying calvarial fracture. Sinuses/Orbits: Small mucosal retention cysts are noted within the bilateral maxillary sinuses. There is mucosal thickening of the ethmoid air cells. The remaining paranasal sinuses and mastoid air cells are essentially clear. Other: None. IMPRESSION: 1. No acute intracranial abnormality. 2. Atrophy, greater than expected for the patient's age. Electronically Signed   By: Constance Holster M.D.   On: 02/10/2019 21:43   CT ABDOMEN PELVIS W CONTRAST  Result Date: 02/11/2019 CLINICAL DATA:  Fever all.  Diarrhea.  Active GI bleeding. EXAM: CT ABDOMEN AND PELVIS WITH CONTRAST TECHNIQUE: Multidetector CT imaging of the abdomen and pelvis was performed using the standard protocol following bolus administration of intravenous contrast. CONTRAST:  178mL OMNIPAQUE IOHEXOL 300 MG/ML  SOLN COMPARISON:  10/04/2018 FINDINGS: Lower chest: The lung bases are clear. The heart size is normal. There is a probable pericardial cyst, stable from prior study. Hepatobiliary: Again noted is severe hepatic steatosis with findings of cirrhosis. There is a stable cystic appearing lesion involving the right hepatic lobe. There is a stable cystic lesion involving the anterior left hepatic lobe (axial series 3, image 36). Pancreas: Peripancreatic fat stranding is noted. Spleen: The spleen is significantly enlarged. Adrenals/Urinary Tract: --Adrenal glands: No adrenal  hemorrhage. --Right kidney/ureter: No hydronephrosis or perinephric hematoma. --Left kidney/ureter: No hydronephrosis or perinephric hematoma. --Urinary bladder: Unremarkable. Stomach/Bowel: --Stomach/Duodenum: There is a small hiatal hernia. --Small bowel: No dilatation or inflammation. --Colon: There may be some mild wall thickening of the splenic flexure of the colon. There may be some mild wall thickening at the hepatic flexure of the colon as well as the cecum and ascending colon. --Appendix: Normal. Vascular/Lymphatic: Atherosclerotic calcification is present within the non-aneurysmal abdominal aorta, without hemodynamically significant stenosis. The portal vein remains patent. Esophageal varices are suspected. Portosystemic shunting is noted consistent with portal hypertension. --there is shotty retroperitoneal adenopathy. --mild mesenteric adenopathy is noted --No pelvic or inguinal lymphadenopathy. Reproductive: Unremarkable Other: The patient is status post prior ventral hernia repair. There is a  small volume of free fluid in the patient's abdomen. There is no free air. Mesenteric edema is noted. Musculoskeletal. There is a bilateral pars defect at L5 with minimal anterolisthesis of L5 on S1. IMPRESSION: 1. Cirrhosis with stigmata of portal hypertension as detailed above. 2. Peripancreatic free fluid in fat stranding. Correlation with lipase is recommended to help exclude underlying pancreatitis. 3. Wall thickening of significant portions of the colon. Findings may be secondary to a diffuse infectious or inflammatory colitis. Portal hypertensive colopathy is also within the differential. 4. Scattered retroperitoneal and mesenteric adenopathy, presumably reactive. Electronically Signed   By: Constance Holster M.D.   On: 02/11/2019 00:17   DG Chest Port 1 View  Result Date: 02/10/2019 CLINICAL DATA:  Sepsis EXAM: PORTABLE CHEST 1 VIEW COMPARISON:  Radiograph 07/31/2018 FINDINGS: Lung volumes are low  with some streaky atelectasis. No consolidation, features of edema, pneumothorax, or effusion. The cardiomediastinal contours are unremarkable. No acute osseous or soft tissue abnormality. IMPRESSION: Low lung volumes. No focal consolidative opacity or other acute cardiopulmonary abnormality. Electronically Signed   By: Lovena Le M.D.   On: 02/10/2019 21:29    EKG: Independently reviewed.  Sinus tachycardia, heart rate 112.  Assessment/Plan Principal Problem:   Upper GI bleed Active Problems:   Hypokalemia   Coagulopathy (HCC)   Sepsis (HCC)   Colitis   Hematemesis/ upper GI bleed Patient with a history of alcoholic liver cirrhosis presenting to the ED after having syncopal episodes and hematemesis.  Not hypotensive but tachycardic.  Hemoglobin 4.6, previously in the 9 range.  FOBT positive.  INR greater than 10.  EGD done September 2020 showing portal hypertensive gastropathy and minimal varices.  No further episodes of hematemesis since the patient has been in the ED. -Admit to progressive care unit -Type and screen -2 units PRBCs -IV PPI bolus and infusion -IV fluid resuscitation -Medications for INR reversal -Antiemetic as needed -Monitor H&H -Keep n.p.o. -ED provider discussed the case with GI, will consult in a.m.  Not recommending octreotide.  Coagulopathy in the setting of liver disease INR greater than 10. -Received FFP and IV vitamin K -Repeat coags and monitor closely  SIRS/possible sepsis secondary to colitis Patient with complaints of dark green diarrhea. CT abdomen pelvis showing wall thickening of significant portions of the colon concerning for infectious or inflammatory colitis.  Afebrile and no significant leukocytosis.  Lactic acid elevated in the setting of underlying liver disease and acute GI bleed. -Received IV fluid boluses per sepsis protocol -Continue ceftriaxone and metronidazole -Blood culture x2 pending -GI pathogen panel -C. difficile PCR  Mild  hypokalemia Potassium 3.2. -Replete potassium.  Check magnesium level and replete if low.  Continue to monitor electrolytes.  Chronic thrombocytopenia Suspect related to underlying liver disease.  Platelet count 124,000, improved compared to prior labs. -Continue to monitor  History of alcohol use disorder Patient states he stopped drinking in September 2020.  Does report feeling anxious. -CIWA protocol at this time; Ativan as needed -Thiamine, folate, multivitamin  DVT prophylaxis: SCDs Code Status: Full code Family Communication: No family available at this time. Disposition Plan: Anticipate discharge after clinical improvement. Consults called: Gastroenterology Admission status: It is my clinical opinion that admission to [facility] is reasonable and necessary in this 44 y.o. male . presenting with syncopal episode secondary to hematemesis/upper GI bleed and SIRS/possible sepsis secondary to colitis.  Very high risk of decompensation.  Given the aforementioned, the predictability of an adverse outcome is felt to be significant. I expect that  the patient will require at least 2 midnights in the hospital to treat this condition.   The medical decision making on this patient was of high complexity and the patient is at high risk for clinical deterioration, therefore this is a level 3 visit.  Shela Leff MD [facility]  If 7PM-7AM, please contact night-coverage [email or web address] Password [facility]  02/11/2019, 3:27 AM

## 2019-02-11 NOTE — ED Notes (Signed)
Pt transported via cart on continuous monitoring to 4N04C by this RN. Pt conscious, breathing, and A&Ox4. No distress noted. All belongings with pt. Pt care endorsed to [facility].

## 2019-02-11 NOTE — ED Notes (Signed)
Per primary RN patient has been keeping family updated himself

## 2019-02-11 NOTE — H&P (View-Only) (Signed)
Referring Provider:  [facility] Primary Care Physician:  [name], MD Primary Gastroenterologist:  Dr. [name]  Reason for Consultation: GI bleed, cirrhosis, syncope  HPI: Joshua Hubbard is a 44 y.o. male with past medical history of alcoholic cirrhosis presented to the hospital with syncope and hematemesis.  He was admitted to the hospital in September 2020 with hematemesis.  CT scan at that time showed cirrhosis of the liver as well as gastric and esophageal varices.  EGD on October 05, 2018 by Dr. [name]  showed questionable small esophageal varices and portal hypertensive gastropathy.  Biopsies were negative for H. pylori.  Was discharged home with propranolol 10 mg twice a day.  Upon initial evaluation yesterday, he was found to have hemoglobin of 4.6, platelet count 124, T bili 1.9 otherwise normal LFTs, normal lipase.  INR was more than 10.  CT abdomen pelvis with contrast again showed evidence of cirrhosis.  Patient seen and examined at bedside.  He was doing fine until 2 days ago when he started having diarrhea with multiple nonbloody bowel movements on Saturday.  This was followed by fever of 100.7.  Patient continued to have multiple nonbloody diarrhea throughout the Sunday and subsequently had a syncopal episode associated with hematemesis.  He had seen some streaks of blood on the tissue paper but denies seeing any black color stool or bright blood in the stool.  He denied any abdominal pain associated with diarrhea.  Took TheraFlu after started having diarrhea and fever.  Had 1 glass of wine in November.  No known sick contact.  He took propranolol for 30 days  Past Medical History:  Diagnosis Date  . Arthritis   . PONV (postoperative nausea and vomiting)   . Tears of meniscus and ACL of left knee 03/19/2018    Past Surgical History:  Procedure Laterality Date  . ARTHROSCOPIC REPAIR ACL     x2  . BIOPSY  10/05/2018   Procedure: BIOPSY;  Surgeon: Ronald Lobo, MD;   Location: [place];  Service: Endoscopy;;  . ESOPHAGOGASTRODUODENOSCOPY (EGD) WITH PROPOFOL N/A 10/05/2018   Procedure: ESOPHAGOGASTRODUODENOSCOPY (EGD) WITH PROPOFOL;  Surgeon: Ronald Lobo, MD;  Location: [place];  Service: Endoscopy;  Laterality: N/A;  . HERNIA REPAIR     x3  . KNEE ARTHROSCOPY WITH ANTERIOR CRUCIATE LIGAMENT (ACL) REPAIR WITH HAMSTRING GRAFT Left 03/20/2018   Procedure: KNEE ARTHROSCOPY WITH REVESION ANTERIOR CRUCIATE LIGAMENT (ACL) REPAIR WITH  HAMSTRING ALLOGRAFT,;  Surgeon: Elsie Saas, MD;  Location: [place];  Service: Orthopedics;  Laterality: Left;  . KNEE ARTHROSCOPY WITH LATERAL MENISECTOMY Left 03/20/2018   Procedure: KNEE ARTHROSCOPY WITH LATERAL MENISECTOMY;  Surgeon: Elsie Saas, MD;  Location: [place];  Service: Orthopedics;  Laterality: Left;  . KNEE ARTHROSCOPY WITH MEDIAL MENISECTOMY Left 03/20/2018   Procedure: KNEE ARTHROSCOPY WITH MEDIAL MENISECTOMY;  Surgeon: Elsie Saas, MD;  Location: [place];  Service: Orthopedics;  Laterality: Left;    Prior to Admission medications   Medication Sig Start Date End Date Taking? Authorizing Provider  clonazePAM (KLONOPIN) 1 MG tablet TAKE 1 TABLET AT BEDTIME Patient taking differently: Take 1 mg by mouth at bedtime.  07/02/18  Yes [name], MD  Multiple Vitamin (MULTIVITAMIN WITH MINERALS) TABS tablet Take 1 tablet by mouth daily.   Yes [provider]  magnesium chloride (SLOW-MAG) 64 MG TBEC SR tablet Take 1 tablet (64 mg total) by mouth 2 (two) times daily. Patient not taking: Reported on 02/11/2019 01/10/19  Dene Gentry, MD  pantoprazole (PROTONIX) 40 MG tablet Take 1 tablet (40 mg total) by mouth 2 (two) times daily. 10/07/18 11/06/18  Terrilee Croak, MD  propranolol (INDERAL) 10 MG tablet Take 1 tablet (10 mg total) by mouth 2 (two) times daily. 10/07/18 11/06/18  Terrilee Croak, MD    Scheduled Meds: . folic acid  1 mg Oral  Daily  . multivitamin with minerals  1 tablet Oral Daily  . thiamine  100 mg Oral Daily   Or  . thiamine  100 mg Intravenous Daily   Continuous Infusions: . sodium chloride    . sodium chloride    . cefTRIAXone (ROCEPHIN)  IV    . metronidazole 500 mg (02/11/19 0647)  . pantoprozole (PROTONIX) infusion 8 mg/hr (02/11/19 0021)   PRN Meds:.LORazepam **OR** LORazepam, ondansetron (ZOFRAN) IV  Allergies as of 02/10/2019  . (No Known Allergies)    Family History  Problem Relation Age of Onset  . Osteoarthritis Mother   . Coronary artery disease Father   . Hyperlipidemia Father     Social History   Socioeconomic History  . Marital status: Married    Spouse name: Not on file  . Number of children: Not on file  . Years of education: Not on file  . Highest education level: Not on file  Occupational History  . Not on file  Tobacco Use  . Smoking status: Never Smoker  . Smokeless tobacco: Never Used  Substance and Sexual Activity  . Alcohol use: Yes    Alcohol/week: 42.0 standard drinks    Types: 42 Cans of beer per week  . Drug use: No  . Sexual activity: Not on file  Other Topics Concern  . Not on file  Social History Narrative  . Not on file   Social Determinants of Health   Financial Resource Strain:   . Difficulty of Paying Living Expenses: Not on file  Food Insecurity:   . Worried About Charity fundraiser in the Last Year: Not on file  . Ran Out of Food in the Last Year: Not on file  Transportation Needs:   . Lack of Transportation (Medical): Not on file  . Lack of Transportation (Non-Medical): Not on file  Physical Activity:   . Days of Exercise per Week: Not on file  . Minutes of Exercise per Session: Not on file  Stress:   . Feeling of Stress : Not on file  Social Connections:   . Frequency of Communication with Friends and Family: Not on file  . Frequency of Social Gatherings with Friends and Family: Not on file  . Attends Religious Services: Not on  file  . Active Member of Clubs or Organizations: Not on file  . Attends Archivist Meetings: Not on file  . Marital Status: Not on file  Intimate Partner Violence:   . Fear of Current or Ex-Partner: Not on file  . Emotionally Abused: Not on file  . Physically Abused: Not on file  . Sexually Abused: Not on file    Review of Systems: Review of Systems  Constitutional: Positive for chills, fever and malaise/fatigue.  HENT: Negative for hearing loss and tinnitus.   Eyes: Negative for blurred vision and double vision.  Respiratory: Negative for cough, hemoptysis and sputum production.   Cardiovascular: Negative for chest pain and palpitations.  Gastrointestinal: Positive for diarrhea and vomiting. Negative for abdominal pain, blood in stool, heartburn, melena and nausea.  Genitourinary: Negative for dysuria and urgency.  Musculoskeletal: Negative for myalgias and neck pain.  Skin: Negative for itching and rash.  Neurological: Positive for dizziness. Negative for seizures.  Endo/Heme/Allergies: Bruises/bleeds easily.  Psychiatric/Behavioral: Negative for hallucinations and suicidal ideas. The patient is nervous/anxious.     Physical Exam: Vital signs: Vitals:   02/11/19 0530 02/11/19 0600  BP: 123/76 115/76  Pulse: (!) 107 (!) 107  Resp: (!) 23 20  Temp:    SpO2: 99% 95%     Physical Exam  Constitutional: He is oriented to person, place, and time. He appears well-developed and well-nourished. No distress.  HENT:  Head: Normocephalic and atraumatic.  Eyes: EOM are normal. No scleral icterus.  Cardiovascular: Regular rhythm and normal heart sounds.  tachycardia  Pulmonary/Chest: Effort normal. No respiratory distress.  Abdominal: Soft. Bowel sounds are normal. He exhibits no distension. There is no abdominal tenderness. There is no rebound and no guarding.  Musculoskeletal:        General: No edema. Normal range of motion.     Cervical back: Normal range of motion  and neck supple.  Neurological: He is alert and oriented to person, place, and time.  Skin: Skin is warm. No erythema.  Psychiatric: He has a normal mood and affect. Judgment and thought content normal.  Nursing note and vitals reviewed.   GI:  Lab Results: Recent Labs    02/10/19 2108  WBC 10.6*  HGB 4.6*  HCT 16.8*  PLT 124*   BMET Recent Labs    02/10/19 2108  NA 136  K 3.2*  CL 104  CO2 21*  GLUCOSE 138*  BUN 20  CREATININE 0.77  CALCIUM 7.7*   LFT Recent Labs    02/10/19 2108  PROT 6.0*  ALBUMIN 2.6*  AST 34  ALT 18  ALKPHOS 47  BILITOT 1.9*   PT/INR Recent Labs    02/10/19 2120  LABPROT >90.0*  INR >10.0*     Studies/Results: CT Head Wo Contrast  Result Date: 02/10/2019 CLINICAL DATA:  Syncope. EXAM: CT HEAD WITHOUT CONTRAST TECHNIQUE: Contiguous axial images were obtained from the base of the skull through the vertex without intravenous contrast. COMPARISON:  None. FINDINGS: Brain: No evidence of acute infarction, hemorrhage, hydrocephalus, extra-axial collection or mass lesion/mass effect. There is mild atrophy, greater than expected for the patient's age. Vascular: No hyperdense vessel or unexpected calcification. Skull: There may be some mild left frontal scalp swelling. No evidence for an underlying calvarial fracture. Sinuses/Orbits: Small mucosal retention cysts are noted within the bilateral maxillary sinuses. There is mucosal thickening of the ethmoid air cells. The remaining paranasal sinuses and mastoid air cells are essentially clear. Other: None. IMPRESSION: 1. No acute intracranial abnormality. 2. Atrophy, greater than expected for the patient's age. Electronically Signed   By: Constance Holster M.D.   On: 02/10/2019 21:43   CT ABDOMEN PELVIS W CONTRAST  Result Date: 02/11/2019 CLINICAL DATA:  Fever all.  Diarrhea.  Active GI bleeding. EXAM: CT ABDOMEN AND PELVIS WITH CONTRAST TECHNIQUE: Multidetector CT imaging of the abdomen and pelvis  was performed using the standard protocol following bolus administration of intravenous contrast. CONTRAST:  170mL OMNIPAQUE IOHEXOL 300 MG/ML  SOLN COMPARISON:  10/04/2018 FINDINGS: Lower chest: The lung bases are clear. The heart size is normal. There is a probable pericardial cyst, stable from prior study. Hepatobiliary: Again noted is severe hepatic steatosis with findings of cirrhosis. There is a stable cystic appearing lesion involving the right hepatic lobe. There is a stable cystic lesion involving the anterior  left hepatic lobe (axial series 3, image 36). Pancreas: Peripancreatic fat stranding is noted. Spleen: The spleen is significantly enlarged. Adrenals/Urinary Tract: --Adrenal glands: No adrenal hemorrhage. --Right kidney/ureter: No hydronephrosis or perinephric hematoma. --Left kidney/ureter: No hydronephrosis or perinephric hematoma. --Urinary bladder: Unremarkable. Stomach/Bowel: --Stomach/Duodenum: There is a small hiatal hernia. --Small bowel: No dilatation or inflammation. --Colon: There may be some mild wall thickening of the splenic flexure of the colon. There may be some mild wall thickening at the hepatic flexure of the colon as well as the cecum and ascending colon. --Appendix: Normal. Vascular/Lymphatic: Atherosclerotic calcification is present within the non-aneurysmal abdominal aorta, without hemodynamically significant stenosis. The portal vein remains patent. Esophageal varices are suspected. Portosystemic shunting is noted consistent with portal hypertension. --there is shotty retroperitoneal adenopathy. --mild mesenteric adenopathy is noted --No pelvic or inguinal lymphadenopathy. Reproductive: Unremarkable Other: The patient is status post prior ventral hernia repair. There is a small volume of free fluid in the patient's abdomen. There is no free air. Mesenteric edema is noted. Musculoskeletal. There is a bilateral pars defect at L5 with minimal anterolisthesis of L5 on S1.  IMPRESSION: 1. Cirrhosis with stigmata of portal hypertension as detailed above. 2. Peripancreatic free fluid in fat stranding. Correlation with lipase is recommended to help exclude underlying pancreatitis. 3. Wall thickening of significant portions of the colon. Findings may be secondary to a diffuse infectious or inflammatory colitis. Portal hypertensive colopathy is also within the differential. 4. Scattered retroperitoneal and mesenteric adenopathy, presumably reactive. Electronically Signed   By: Constance Holster M.D.   On: 02/11/2019 00:17   DG Chest Port 1 View  Result Date: 02/10/2019 CLINICAL DATA:  Sepsis EXAM: PORTABLE CHEST 1 VIEW COMPARISON:  Radiograph 07/31/2018 FINDINGS: Lung volumes are low with some streaky atelectasis. No consolidation, features of edema, pneumothorax, or effusion. The cardiomediastinal contours are unremarkable. No acute osseous or soft tissue abnormality. IMPRESSION: Low lung volumes. No focal consolidative opacity or other acute cardiopulmonary abnormality. Electronically Signed   By: Lovena Le M.D.   On: 02/10/2019 21:29    Impression/Plan: -1 episode of hematemesis.  History of alcoholic cirrhosis with EGD in September 2020 showed questionable small varices and portal hypertensive gastropathy.  Differential diagnosis could be variceal bleed versus diffuse mucosal bleeding from coagulopathy.  -Coagulopathy.  INR more than 10.  No signs of acute liver failure as transaminases are normal and he is alert/oriented x3.  -Acute blood loss anemia. -Diarrhea. ?  Infectious etiology.  CT scan showed wall thickening of the colon which could be infectious/inflammatory or from portal hypertension.  Recommendations ------------------------ -Continue supportive care for now.  Only one episode of hematemesis.  Diarrhea has improved.  No bowel movement in last 3 hours.  -He has received PRBC and FFP along with vitamin K.  -Repeat CBC and INR pending.  Stool studies  pending.  -Plan for EGD once INR is less than 2 and hemoglobin is more than 7.    -Check acetaminophen level.  -Keep n.p.o. for now.  GI will follow    LOS: 0 days   [name]  MD, FACP 02/11/2019, 8:06 AM  Contact #  [phone]

## 2019-02-11 NOTE — ED Provider Notes (Signed)
Call taken for Benedetto Goad, PA-C, from Dr. Michail Sermon, GI. Patient's presentation, condition and ED work up given in detail. He was in agreement with treatment started in ED, no octreotide tx, and will see the patient in hospital tomorrow.    Charlann Lange, PA-C 02/11/19 0126    Noemi Chapel, MD 02/12/19 [phone]

## 2019-02-11 NOTE — ED Notes (Signed)
Dinner Tray Ordered @1734.  

## 2019-02-11 NOTE — ED Notes (Signed)
Report given to [facility] RN on 4N with no questions or concerns.

## 2019-02-11 NOTE — ED Notes (Signed)
Pt resting in bed. No distress noted. Medications given and charted per [facility]. Tolerated well. Blood infusing per MAR.

## 2019-02-11 NOTE — Consult Note (Signed)
Referring Provider:  [facility] Primary Care Physician:  [name], MD Primary Gastroenterologist:  Dr. [name]  Reason for Consultation: GI bleed, cirrhosis, syncope  HPI: Joshua Hubbard is a 44 y.o. male with past medical history of alcoholic cirrhosis presented to the hospital with syncope and hematemesis.  He was admitted to the hospital in September 2020 with hematemesis.  CT scan at that time showed cirrhosis of the liver as well as gastric and esophageal varices.  EGD on October 05, 2018 by Dr. [name]  showed questionable small esophageal varices and portal hypertensive gastropathy.  Biopsies were negative for H. pylori.  Was discharged home with propranolol 10 mg twice a day.  Upon initial evaluation yesterday, he was found to have hemoglobin of 4.6, platelet count 124, T bili 1.9 otherwise normal LFTs, normal lipase.  INR was more than 10.  CT abdomen pelvis with contrast again showed evidence of cirrhosis.  Patient seen and examined at bedside.  He was doing fine until 2 days ago when he started having diarrhea with multiple nonbloody bowel movements on Saturday.  This was followed by fever of 100.7.  Patient continued to have multiple nonbloody diarrhea throughout the Sunday and subsequently had a syncopal episode associated with hematemesis.  He had seen some streaks of blood on the tissue paper but denies seeing any black color stool or bright blood in the stool.  He denied any abdominal pain associated with diarrhea.  Took TheraFlu after started having diarrhea and fever.  Had 1 glass of wine in November.  No known sick contact.  He took propranolol for 30 days  Past Medical History:  Diagnosis Date  . Arthritis   . PONV (postoperative nausea and vomiting)   . Tears of meniscus and ACL of left knee 03/19/2018    Past Surgical History:  Procedure Laterality Date  . ARTHROSCOPIC REPAIR ACL     x2  . BIOPSY  10/05/2018   Procedure: BIOPSY;  Surgeon: Ronald Lobo, MD;   Location: [place];  Service: Endoscopy;;  . ESOPHAGOGASTRODUODENOSCOPY (EGD) WITH PROPOFOL N/A 10/05/2018   Procedure: ESOPHAGOGASTRODUODENOSCOPY (EGD) WITH PROPOFOL;  Surgeon: Ronald Lobo, MD;  Location: [place];  Service: Endoscopy;  Laterality: N/A;  . HERNIA REPAIR     x3  . KNEE ARTHROSCOPY WITH ANTERIOR CRUCIATE LIGAMENT (ACL) REPAIR WITH HAMSTRING GRAFT Left 03/20/2018   Procedure: KNEE ARTHROSCOPY WITH REVESION ANTERIOR CRUCIATE LIGAMENT (ACL) REPAIR WITH  HAMSTRING ALLOGRAFT,;  Surgeon: Elsie Saas, MD;  Location: [place];  Service: Orthopedics;  Laterality: Left;  . KNEE ARTHROSCOPY WITH LATERAL MENISECTOMY Left 03/20/2018   Procedure: KNEE ARTHROSCOPY WITH LATERAL MENISECTOMY;  Surgeon: Elsie Saas, MD;  Location: [place];  Service: Orthopedics;  Laterality: Left;  . KNEE ARTHROSCOPY WITH MEDIAL MENISECTOMY Left 03/20/2018   Procedure: KNEE ARTHROSCOPY WITH MEDIAL MENISECTOMY;  Surgeon: Elsie Saas, MD;  Location: [place];  Service: Orthopedics;  Laterality: Left;    Prior to Admission medications   Medication Sig Start Date End Date Taking? Authorizing Provider  clonazePAM (KLONOPIN) 1 MG tablet TAKE 1 TABLET AT BEDTIME Patient taking differently: Take 1 mg by mouth at bedtime.  07/02/18  Yes [name], MD  Multiple Vitamin (MULTIVITAMIN WITH MINERALS) TABS tablet Take 1 tablet by mouth daily.   Yes [provider]  magnesium chloride (SLOW-MAG) 64 MG TBEC SR tablet Take 1 tablet (64 mg total) by mouth 2 (two) times daily. Patient not taking: Reported on 02/11/2019 01/10/19  Dene Gentry, MD  pantoprazole (PROTONIX) 40 MG tablet Take 1 tablet (40 mg total) by mouth 2 (two) times daily. 10/07/18 11/06/18  Terrilee Croak, MD  propranolol (INDERAL) 10 MG tablet Take 1 tablet (10 mg total) by mouth 2 (two) times daily. 10/07/18 11/06/18  Terrilee Croak, MD    Scheduled Meds: . folic acid  1 mg Oral  Daily  . multivitamin with minerals  1 tablet Oral Daily  . thiamine  100 mg Oral Daily   Or  . thiamine  100 mg Intravenous Daily   Continuous Infusions: . sodium chloride    . sodium chloride    . cefTRIAXone (ROCEPHIN)  IV    . metronidazole 500 mg (02/11/19 0647)  . pantoprozole (PROTONIX) infusion 8 mg/hr (02/11/19 0021)   PRN Meds:.LORazepam **OR** LORazepam, ondansetron (ZOFRAN) IV  Allergies as of 02/10/2019  . (No Known Allergies)    Family History  Problem Relation Age of Onset  . Osteoarthritis Mother   . Coronary artery disease Father   . Hyperlipidemia Father     Social History   Socioeconomic History  . Marital status: Married    Spouse name: Not on file  . Number of children: Not on file  . Years of education: Not on file  . Highest education level: Not on file  Occupational History  . Not on file  Tobacco Use  . Smoking status: Never Smoker  . Smokeless tobacco: Never Used  Substance and Sexual Activity  . Alcohol use: Yes    Alcohol/week: 42.0 standard drinks    Types: 42 Cans of beer per week  . Drug use: No  . Sexual activity: Not on file  Other Topics Concern  . Not on file  Social History Narrative  . Not on file   Social Determinants of Health   Financial Resource Strain:   . Difficulty of Paying Living Expenses: Not on file  Food Insecurity:   . Worried About Charity fundraiser in the Last Year: Not on file  . Ran Out of Food in the Last Year: Not on file  Transportation Needs:   . Lack of Transportation (Medical): Not on file  . Lack of Transportation (Non-Medical): Not on file  Physical Activity:   . Days of Exercise per Week: Not on file  . Minutes of Exercise per Session: Not on file  Stress:   . Feeling of Stress : Not on file  Social Connections:   . Frequency of Communication with Friends and Family: Not on file  . Frequency of Social Gatherings with Friends and Family: Not on file  . Attends Religious Services: Not on  file  . Active Member of Clubs or Organizations: Not on file  . Attends Archivist Meetings: Not on file  . Marital Status: Not on file  Intimate Partner Violence:   . Fear of Current or Ex-Partner: Not on file  . Emotionally Abused: Not on file  . Physically Abused: Not on file  . Sexually Abused: Not on file    Review of Systems: Review of Systems  Constitutional: Positive for chills, fever and malaise/fatigue.  HENT: Negative for hearing loss and tinnitus.   Eyes: Negative for blurred vision and double vision.  Respiratory: Negative for cough, hemoptysis and sputum production.   Cardiovascular: Negative for chest pain and palpitations.  Gastrointestinal: Positive for diarrhea and vomiting. Negative for abdominal pain, blood in stool, heartburn, melena and nausea.  Genitourinary: Negative for dysuria and urgency.  Musculoskeletal: Negative for myalgias and neck pain.  Skin: Negative for itching and rash.  Neurological: Positive for dizziness. Negative for seizures.  Endo/Heme/Allergies: Bruises/bleeds easily.  Psychiatric/Behavioral: Negative for hallucinations and suicidal ideas. The patient is nervous/anxious.     Physical Exam: Vital signs: Vitals:   02/11/19 0530 02/11/19 0600  BP: 123/76 115/76  Pulse: (!) 107 (!) 107  Resp: (!) 23 20  Temp:    SpO2: 99% 95%     Physical Exam  Constitutional: He is oriented to person, place, and time. He appears well-developed and well-nourished. No distress.  HENT:  Head: Normocephalic and atraumatic.  Eyes: EOM are normal. No scleral icterus.  Cardiovascular: Regular rhythm and normal heart sounds.  tachycardia  Pulmonary/Chest: Effort normal. No respiratory distress.  Abdominal: Soft. Bowel sounds are normal. He exhibits no distension. There is no abdominal tenderness. There is no rebound and no guarding.  Musculoskeletal:        General: No edema. Normal range of motion.     Cervical back: Normal range of motion  and neck supple.  Neurological: He is alert and oriented to person, place, and time.  Skin: Skin is warm. No erythema.  Psychiatric: He has a normal mood and affect. Judgment and thought content normal.  Nursing note and vitals reviewed.   GI:  Lab Results: Recent Labs    02/10/19 2108  WBC 10.6*  HGB 4.6*  HCT 16.8*  PLT 124*   BMET Recent Labs    02/10/19 2108  NA 136  K 3.2*  CL 104  CO2 21*  GLUCOSE 138*  BUN 20  CREATININE 0.77  CALCIUM 7.7*   LFT Recent Labs    02/10/19 2108  PROT 6.0*  ALBUMIN 2.6*  AST 34  ALT 18  ALKPHOS 47  BILITOT 1.9*   PT/INR Recent Labs    02/10/19 2120  LABPROT >90.0*  INR >10.0*     Studies/Results: CT Head Wo Contrast  Result Date: 02/10/2019 CLINICAL DATA:  Syncope. EXAM: CT HEAD WITHOUT CONTRAST TECHNIQUE: Contiguous axial images were obtained from the base of the skull through the vertex without intravenous contrast. COMPARISON:  None. FINDINGS: Brain: No evidence of acute infarction, hemorrhage, hydrocephalus, extra-axial collection or mass lesion/mass effect. There is mild atrophy, greater than expected for the patient's age. Vascular: No hyperdense vessel or unexpected calcification. Skull: There may be some mild left frontal scalp swelling. No evidence for an underlying calvarial fracture. Sinuses/Orbits: Small mucosal retention cysts are noted within the bilateral maxillary sinuses. There is mucosal thickening of the ethmoid air cells. The remaining paranasal sinuses and mastoid air cells are essentially clear. Other: None. IMPRESSION: 1. No acute intracranial abnormality. 2. Atrophy, greater than expected for the patient's age. Electronically Signed   By: Constance Holster M.D.   On: 02/10/2019 21:43   CT ABDOMEN PELVIS W CONTRAST  Result Date: 02/11/2019 CLINICAL DATA:  Fever all.  Diarrhea.  Active GI bleeding. EXAM: CT ABDOMEN AND PELVIS WITH CONTRAST TECHNIQUE: Multidetector CT imaging of the abdomen and pelvis  was performed using the standard protocol following bolus administration of intravenous contrast. CONTRAST:  196mL OMNIPAQUE IOHEXOL 300 MG/ML  SOLN COMPARISON:  10/04/2018 FINDINGS: Lower chest: The lung bases are clear. The heart size is normal. There is a probable pericardial cyst, stable from prior study. Hepatobiliary: Again noted is severe hepatic steatosis with findings of cirrhosis. There is a stable cystic appearing lesion involving the right hepatic lobe. There is a stable cystic lesion involving the anterior  left hepatic lobe (axial series 3, image 36). Pancreas: Peripancreatic fat stranding is noted. Spleen: The spleen is significantly enlarged. Adrenals/Urinary Tract: --Adrenal glands: No adrenal hemorrhage. --Right kidney/ureter: No hydronephrosis or perinephric hematoma. --Left kidney/ureter: No hydronephrosis or perinephric hematoma. --Urinary bladder: Unremarkable. Stomach/Bowel: --Stomach/Duodenum: There is a small hiatal hernia. --Small bowel: No dilatation or inflammation. --Colon: There may be some mild wall thickening of the splenic flexure of the colon. There may be some mild wall thickening at the hepatic flexure of the colon as well as the cecum and ascending colon. --Appendix: Normal. Vascular/Lymphatic: Atherosclerotic calcification is present within the non-aneurysmal abdominal aorta, without hemodynamically significant stenosis. The portal vein remains patent. Esophageal varices are suspected. Portosystemic shunting is noted consistent with portal hypertension. --there is shotty retroperitoneal adenopathy. --mild mesenteric adenopathy is noted --No pelvic or inguinal lymphadenopathy. Reproductive: Unremarkable Other: The patient is status post prior ventral hernia repair. There is a small volume of free fluid in the patient's abdomen. There is no free air. Mesenteric edema is noted. Musculoskeletal. There is a bilateral pars defect at L5 with minimal anterolisthesis of L5 on S1.  IMPRESSION: 1. Cirrhosis with stigmata of portal hypertension as detailed above. 2. Peripancreatic free fluid in fat stranding. Correlation with lipase is recommended to help exclude underlying pancreatitis. 3. Wall thickening of significant portions of the colon. Findings may be secondary to a diffuse infectious or inflammatory colitis. Portal hypertensive colopathy is also within the differential. 4. Scattered retroperitoneal and mesenteric adenopathy, presumably reactive. Electronically Signed   By: Constance Holster M.D.   On: 02/11/2019 00:17   DG Chest Port 1 View  Result Date: 02/10/2019 CLINICAL DATA:  Sepsis EXAM: PORTABLE CHEST 1 VIEW COMPARISON:  Radiograph 07/31/2018 FINDINGS: Lung volumes are low with some streaky atelectasis. No consolidation, features of edema, pneumothorax, or effusion. The cardiomediastinal contours are unremarkable. No acute osseous or soft tissue abnormality. IMPRESSION: Low lung volumes. No focal consolidative opacity or other acute cardiopulmonary abnormality. Electronically Signed   By: Lovena Le M.D.   On: 02/10/2019 21:29    Impression/Plan: -1 episode of hematemesis.  History of alcoholic cirrhosis with EGD in September 2020 showed questionable small varices and portal hypertensive gastropathy.  Differential diagnosis could be variceal bleed versus diffuse mucosal bleeding from coagulopathy.  -Coagulopathy.  INR more than 10.  No signs of acute liver failure as transaminases are normal and he is alert/oriented x3.  -Acute blood loss anemia. -Diarrhea. ?  Infectious etiology.  CT scan showed wall thickening of the colon which could be infectious/inflammatory or from portal hypertension.  Recommendations ------------------------ -Continue supportive care for now.  Only one episode of hematemesis.  Diarrhea has improved.  No bowel movement in last 3 hours.  -He has received PRBC and FFP along with vitamin K.  -Repeat CBC and INR pending.  Stool studies  pending.  -Plan for EGD once INR is less than 2 and hemoglobin is more than 7.    -Check acetaminophen level.  -Keep n.p.o. for now.  GI will follow    LOS: 0 days   [name]  MD, FACP 02/11/2019, 8:06 AM  Contact #  [phone]

## 2019-02-11 NOTE — ED Notes (Signed)
Pt resting in bed. No distress noted. Will continue to monitor.  

## 2019-02-11 NOTE — ED Notes (Signed)
Assisted up to [facility].

## 2019-02-11 NOTE — Progress Notes (Signed)
74 white male admitted by my partner this morning with acute GI bleed-please see HPI for details Hemoglobin was 4-transfusing 2 U PRBC, antiemetics, IV PPI bolus, INR was greater than 10 received FFP IV K Because of green diarrhea started ceftriaxone Flagyl follow C. difficile Electrolytes being replaced  Defer further management of acute bleed to [facility] gastroenterology who are aware  Patient is currently hemodynamically stable and gastroenterology has seen him this morning He tells me that he had 2 episodes of large amounts of bleeding-1 with melanotic type stool and then some vomiting of blood He currently is n.p.o. awaiting procedure We will cycle patient's hemoglobin every 12 hours for now--transfusion threshold below 7  Verneita Griffes, MD [facility] 9:51 AM

## 2019-02-11 NOTE — Progress Notes (Signed)
Requested repeat lactic acid throughout the night. Bedside informs me that he is receiving blood and it cannot be drawn until it is complete and that they will draw it when it is finished.

## 2019-02-12 ENCOUNTER — Encounter (HOSPITAL_COMMUNITY)
Admission: EM | Disposition: A | Discharge status: Home / Self Care | Payer: Self-pay | Source: Home / Self Care | Attending: Internal Medicine

## 2019-02-12 ENCOUNTER — Inpatient Hospital Stay (HOSPITAL_COMMUNITY): Payer: 59 | Admitting: Certified Registered"

## 2019-02-12 HISTORY — PX: ESOPHAGOGASTRODUODENOSCOPY (EGD) WITH PROPOFOL: SHX5813

## 2019-02-12 HISTORY — PX: ESOPHAGEAL BANDING: SHX5518

## 2019-02-12 LAB — PROTIME-INR
INR: 1.6 — ABNORMAL HIGH (ref 0.8–1.2)
Prothrombin Time: 19.3 seconds — ABNORMAL HIGH (ref 11.4–15.2)

## 2019-02-12 LAB — CBC WITH DIFFERENTIAL/PLATELET
Abs Immature Granulocytes: 0.02 10*3/uL (ref 0.00–0.07)
Basophils Absolute: 0.1 10*3/uL (ref 0.0–0.1)
Basophils Relative: 1 %
Eosinophils Absolute: 0.1 10*3/uL (ref 0.0–0.5)
Eosinophils Relative: 2 %
HCT: 17.1 % — ABNORMAL LOW (ref 39.0–52.0)
Hemoglobin: 5.2 g/dL — CL (ref 13.0–17.0)
Immature Granulocytes: 0 %
Lymphocytes Relative: 25 %
Lymphs Abs: 1.5 10*3/uL (ref 0.7–4.0)
MCH: 22.5 pg — ABNORMAL LOW (ref 26.0–34.0)
MCHC: 30.4 g/dL (ref 30.0–36.0)
MCV: 74 fL — ABNORMAL LOW (ref 80.0–100.0)
Monocytes Absolute: 0.6 10*3/uL (ref 0.1–1.0)
Monocytes Relative: 10 %
Neutro Abs: 3.8 10*3/uL (ref 1.7–7.7)
Neutrophils Relative %: 62 %
Platelets: 88 10*3/uL — ABNORMAL LOW (ref 150–400)
RBC: 2.31 MIL/uL — ABNORMAL LOW (ref 4.22–5.81)
RDW: 21.6 % — ABNORMAL HIGH (ref 11.5–15.5)
WBC: 6.1 10*3/uL (ref 4.0–10.5)
nRBC: 0 % (ref 0.0–0.2)

## 2019-02-12 LAB — PREPARE FRESH FROZEN PLASMA

## 2019-02-12 LAB — COMPREHENSIVE METABOLIC PANEL
ALT: 21 U/L (ref 0–44)
AST: 61 U/L — ABNORMAL HIGH (ref 15–41)
Albumin: 2.4 g/dL — ABNORMAL LOW (ref 3.5–5.0)
Alkaline Phosphatase: 47 U/L (ref 38–126)
Anion gap: 7 (ref 5–15)
BUN: 16 mg/dL (ref 6–20)
CO2: 24 mmol/L (ref 22–32)
Calcium: 7.5 mg/dL — ABNORMAL LOW (ref 8.9–10.3)
Chloride: 110 mmol/L (ref 98–111)
Creatinine, Ser: 0.87 mg/dL (ref 0.61–1.24)
GFR calc Af Amer: >60 mL/min (ref >60)
GFR calc non Af Amer: >60 mL/min (ref >60)
Glucose, Bld: 92 mg/dL (ref 70–99)
Potassium: 2.9 mmol/L — ABNORMAL LOW (ref 3.5–5.1)
Sodium: 141 mmol/L (ref 135–145)
Total Bilirubin: 2.9 mg/dL — ABNORMAL HIGH (ref 0.3–1.2)
Total Protein: 5.3 g/dL — ABNORMAL LOW (ref 6.5–8.1)

## 2019-02-12 LAB — URINE CULTURE: Culture: NO GROWTH

## 2019-02-12 LAB — CBC
HCT: 23.3 % — ABNORMAL LOW (ref 39.0–52.0)
Hemoglobin: 7.3 g/dL — ABNORMAL LOW (ref 13.0–17.0)
MCH: 24.1 pg — ABNORMAL LOW (ref 26.0–34.0)
MCHC: 31.3 g/dL (ref 30.0–36.0)
MCV: 76.9 fL — ABNORMAL LOW (ref 80.0–100.0)
Platelets: 87 10*3/uL — ABNORMAL LOW (ref 150–400)
RBC: 3.03 MIL/uL — ABNORMAL LOW (ref 4.22–5.81)
RDW: 21 % — ABNORMAL HIGH (ref 11.5–15.5)
WBC: 4.6 10*3/uL (ref 4.0–10.5)
nRBC: 0 % (ref 0.0–0.2)

## 2019-02-12 LAB — BPAM FFP
Blood Product Expiration Date: 202101222359
ISSUE DATE / TIME: 202101180403
Unit Type and Rh: 2800

## 2019-02-12 LAB — MAGNESIUM: Magnesium: 1.3 mg/dL — ABNORMAL LOW (ref 1.7–2.4)

## 2019-02-12 SURGERY — ESOPHAGOGASTRODUODENOSCOPY (EGD) WITH PROPOFOL
Anesthesia: Monitor Anesthesia Care

## 2019-02-12 MED ORDER — POTASSIUM CHLORIDE 2 MEQ/ML IV SOLN
INTRAVENOUS | Status: DC
  Filled 2019-02-12 (×3): qty 1000

## 2019-02-12 MED ORDER — PROPOFOL 500 MG/50ML IV EMUL
INTRAVENOUS | Status: DC | PRN
  Administered 2019-02-12: 100 ug/kg/min via INTRAVENOUS

## 2019-02-12 MED ORDER — PROPOFOL 10 MG/ML IV BOLUS
INTRAVENOUS | Status: DC | PRN
  Administered 2019-02-12: 30 mg via INTRAVENOUS
  Administered 2019-02-12 (×3): 20 mg via INTRAVENOUS
  Administered 2019-02-12: 10 mg via INTRAVENOUS

## 2019-02-12 MED ORDER — VITAMIN K1 10 MG/ML IJ SOLN
5.0000 mg | Freq: Once | INTRAVENOUS | Status: AC
  Administered 2019-02-12: 5 mg via INTRAVENOUS
  Filled 2019-02-12: qty 0.5

## 2019-02-12 MED ORDER — LACTATED RINGERS IV SOLN
INTRAVENOUS | Status: AC | PRN
  Administered 2019-02-12: 1000 mL via INTRAVENOUS

## 2019-02-12 MED ORDER — MAGNESIUM SULFATE 2 GM/50ML IV SOLN
2.0000 g | Freq: Once | INTRAVENOUS | Status: AC
  Administered 2019-02-12: 2 g via INTRAVENOUS
  Filled 2019-02-12: qty 50

## 2019-02-12 MED ORDER — POTASSIUM CHLORIDE CRYS ER 20 MEQ PO TBCR
40.0000 meq | EXTENDED_RELEASE_TABLET | Freq: Every day | ORAL | Status: DC
  Administered 2019-02-12 – 2019-02-13 (×2): 40 meq via ORAL
  Filled 2019-02-12 (×2): qty 2

## 2019-02-12 MED ORDER — POTASSIUM CHLORIDE 10 MEQ/100ML IV SOLN
10.0000 meq | Freq: Once | INTRAVENOUS | Status: AC
  Administered 2019-02-12: 10 meq via INTRAVENOUS
  Filled 2019-02-12: qty 100

## 2019-02-12 SURGICAL SUPPLY — 15 items

## 2019-02-12 NOTE — Brief Op Note (Addendum)
02/10/2019 - 02/12/2019  10:27 AM  PATIENT:  Joshua Hubbard  44 y.o. male  PRE-OPERATIVE DIAGNOSIS:  GI bleed  POST-OPERATIVE DIAGNOSIS:  Esophageal varcies with banding x 5   PROCEDURE:  Procedure(s): ESOPHAGOGASTRODUODENOSCOPY (EGD) WITH PROPOFOL (N/A) ESOPHAGEAL BANDING  SURGEON:  Surgeon(s) and Role:    * Kimyah Frein, MD - Primary  Findings ----------- -EGD showed large esophageal varices in the mid and distal esophagus with red wale sign at distal esophagus.  Band ligation performed.  Recommendations ---------------------- -Start full liquid diet -Continue octreotide for total 72 hours -Continue Protonix and antibiotics for now -Repeat blood work tomorrow -Recommend repeat EGD in 3 to 4 weeks for retreatment of esophageal varices. -Consider starting beta-blocker before discharge -GI will follow  [name] MD, FACP 02/12/2019, 10:29 AM  Contact #  [phone] normal

## 2019-02-12 NOTE — Anesthesia Preprocedure Evaluation (Addendum)
Anesthesia Evaluation  Patient identified by MRN, date of birth, ID band Patient awake    Reviewed: Allergy & Precautions, NPO status , Patient's Chart, lab work & pertinent test results  History of Anesthesia Complications (+) PONV and history of anesthetic complications  Airway Mallampati: III   Neck ROM: full    Dental   Pulmonary neg pulmonary ROS,    breath sounds clear to auscultation       Cardiovascular negative cardio ROS   Rhythm:regular Rate:Normal     Neuro/Psych    GI/Hepatic (+) Cirrhosis       , GI bleed   Endo/Other    Renal/GU      Musculoskeletal  (+) Arthritis ,   Abdominal   Peds  Hematology  (+) anemia ,   Anesthesia Other Findings   Reproductive/Obstetrics                            Anesthesia Physical Anesthesia Plan  ASA: III  Anesthesia Plan: MAC   Post-op Pain Management:    Induction: Intravenous  PONV Risk Score and Plan: 2 and Propofol infusion and Treatment may vary due to age or medical condition  Airway Management Planned: Nasal Cannula  Additional Equipment:   Intra-op Plan:   Post-operative Plan:   Informed Consent: I have reviewed the patients History and Physical, chart, labs and discussed the procedure including the risks, benefits and alternatives for the proposed anesthesia with the patient or authorized representative who has indicated his/her understanding and acceptance.       Plan Discussed with: CRNA, Anesthesiologist and Surgeon  Anesthesia Plan Comments:         Anesthesia Quick Evaluation

## 2019-02-12 NOTE — Op Note (Signed)
[facility] Patient Name: Joshua Hubbard Procedure Date : 02/12/2019 MRN: [id number] Attending MD: [name] , MD Date of Birth: 1975/02/19 CSN: [id number] Age: 44 Admit Type: Inpatient Procedure:                Upper GI endoscopy Indications:              Hematemesis Providers:                [name], MD, Benay Pillow, RN, Elspeth Cho Tech., Technician, Claybon Jabs CRNA, CRNA Referring MD:              Medicines:                Sedation Administered by an Anesthesia Professional Complications:            No immediate complications. Estimated Blood Loss:     Estimated blood loss was minimal. Procedure:                Pre-Anesthesia Assessment:                           - Prior to the procedure, a History and Physical                            was performed, and patient medications and                            allergies were reviewed. The patient's tolerance of                            previous anesthesia was also reviewed. The risks                            and benefits of the procedure and the sedation                            options and risks were discussed with the patient.                            All questions were answered, and informed consent                            was obtained. Prior Anticoagulants: The patient has                            taken no previous anticoagulant or antiplatelet                            agents. ASA Grade Assessment: III - A patient with                            severe systemic disease. After reviewing the risks  and benefits, the patient was deemed in                            satisfactory condition to undergo the procedure.                           After obtaining informed consent, the endoscope was                            passed under direct vision. Throughout the                            procedure, the patient's blood pressure, pulse,  and                            oxygen saturations were monitored continuously. The                            [id number]) Olympus gastroscope was                            introduced through the mouth, and advanced to the                            second part of duodenum. The upper GI endoscopy was                            accomplished without difficulty. The patient                            tolerated the procedure well. Scope In: Scope Out: Findings:      Three columns of large (> 5 mm) varices were found in the mid esophagus       and in the distal esophagus,. Red wale signs were present. The varices       appeared larger than they were at prior exam. Six bands were       successfully placed with incomplete eradication of varices. 1 band was       misfired and 2 areas of varices had 2 bands placed.      Scattered mild inflammation characterized by erosions and erythema was       found in the prepyloric region of the stomach.      The cardia and gastric fundus were normal on retroflexion.      The duodenal bulb, first portion of the duodenum and second portion of       the duodenum were normal. Impression:               - Large (> 5 mm) esophageal varices. Incompletely                            eradicated. Banded.                           - Gastritis.                           -  Normal duodenal bulb, first portion of the                            duodenum and second portion of the duodenum.                           - No specimens collected. Recommendation:           - Return patient to hospital ward for ongoing care.                           - Full liquid diet.                           - Continue present medications.                           - Repeat upper endoscopy in 1 month for retreatment. Procedure Code(s):        --- Professional ---                           [phone], Esophagogastroduodenoscopy, flexible,                            transoral; with band  ligation of esophageal/gastric                            varices Diagnosis Code(s):        --- Professional ---                           I85.00, Esophageal varices without bleeding                           K29.70, Gastritis, unspecified, without bleeding                           K92.0, Hematemesis CPT copyright 2019 American Medical Association. All rights reserved. The codes documented in this report are preliminary and upon coder review may  be revised to meet current compliance requirements. [name], MD [name], MD 02/12/2019 10:27:44 AM Number of Addenda: 0

## 2019-02-12 NOTE — Progress Notes (Signed)
Social Note: I came by to see Joshua Hubbard after he called me with some questions about further work upof his acute anemia. I discussed the very serious nature of his admission. Hgb of 4 and INR of 10. Reporting several days of diarrhea, several weeks of increased fatigue and difficulty sleeping. He reports significant decrease in alcohol intake since I saw him at last outpatient appt. He had EGD this Am. He says the GI doctor wishes to do colonoscopy as further workup and he wanted to know my thoughts on doing it as outpatient procedure.  I recommended further inpatient evaluation. 1. Anemia: initial hgb of 4.6 with prior hgb in September 2020 of 9.3. reports 2 episodes of hematemesis. EGD shows increased esophageal varices but no current bleeding. 2 were banded. 2. Elevated INR on admission to >10, with no known anticoagulant.  Use is very concerning for acute liver injury/failure although AST/ALT normal, bilirubin slightly low at 6 and albumin low at 2.7. very concerning. He was reversed with vitamin K and FFP. I find this severity of coagulopathy unusual even in setting of chronic alcohol use. 3. Colon noted to have slight wall thickening on CT scan. I think colonoscopy is a good idea, as is repeat blood work etc in AM. 4. Notably, Joshua Hubbard is a patient of the [facility] but he is also an employee at the [employer]. I think it would be best if he stays on the [facility] service if possible. I appreciate their excellent care. I will be available on pager even though I am not currently on service. [phone]

## 2019-02-12 NOTE — Transfer of Care (Signed)
Immediate Anesthesia Transfer of Care Note  Patient: Joshua Hubbard  Procedure(s) Performed: ESOPHAGOGASTRODUODENOSCOPY (EGD) WITH PROPOFOL (N/A ) ESOPHAGEAL BANDING  Patient Location: Endoscopy Unit  Anesthesia Type:MAC  Level of Consciousness: drowsy and patient cooperative  Airway & Oxygen Therapy: Patient Spontanous Breathing and Patient connected to nasal cannula oxygen  Post-op Assessment: Report given to RN and Post -op Vital signs reviewed and stable  Post vital signs: Reviewed and stable  Last Vitals:  Vitals Value Taken Time  BP 105/61 02/12/19 1025  Temp    Pulse 90 02/12/19 1025  Resp 20 02/12/19 1025  SpO2 100 % 02/12/19 1025  Vitals shown include unvalidated device data.  Last Pain:  Vitals:   02/12/19 0923  TempSrc: Oral  PainSc: 0-No pain         Complications: No apparent anesthesia complications

## 2019-02-12 NOTE — Interval H&P Note (Signed)
History and Physical Interval Note:  02/12/2019 9:46 AM  Joshua Hubbard  has presented today for surgery, with the diagnosis of GI bleed.  The various methods of treatment have been discussed with the patient and family. After consideration of risks, benefits and other options for treatment, the patient has consented to  Procedure(s): ESOPHAGOGASTRODUODENOSCOPY (EGD) WITH PROPOFOL (N/A) as a surgical intervention.  The patient's history has been reviewed, patient examined, no change in status, stable for surgery.  I have reviewed the patient's chart and labs.  Questions were answered to the patient's satisfaction.       [name]

## 2019-02-12 NOTE — Progress Notes (Addendum)
Hospitalist progress note  Patient from home, Patient going likely home, Dispo await further clarity regarding GI planning as he is still on multiple drips not ready for home probably until 1/21  Joshua Hubbard [id number] DOB: [birth year] DOA: 02/10/2019  PCP: [name], MD patient should be followed by [place] in the hospital--- will follow in the outpatient with Dr. Nori Riis of family medicine  Narrative:  44 year old white male known history of chronic EtOH, cirrhosis, TCP, electrolyte derangements, hepatic hemangioma?  (Needs MRI of liver when in nonacute setting) admitted with frank hematemesis Previous hospitalization 10/05/2018-10/07/2018 Transfused 4 units PRBC antiemetics, IV PPI Sandostatin and received FFP vitamin K x2 2/2 INR greater than 10 Also significant diarrhea in ED which is since stopped  Data Reviewed:  Potassium 2.9 BUN/creatinine 16/0.8 magnesium 1.3 AST/ALT 61/21 total bili 2.9 Hemoglobin up from 7.0-->7.3 platelet 87 Impression:               - Large (> 5 mm) esophageal varices. Incompletely                            eradicated. Banded.                           - Gastritis.                           - Normal duodenal bulb, first portion of the                            duodenum and second portion of the duodenum.                           - No specimens collected.  Assessment & Plan: Hematemesis from variceal bleed Significant bleeding from varices-banded as per above Defer IV GTT and management of bleed to gastroenterology Diet as per them with regards to next steps would defer to Dr. Alessandra Bevels severe hypokalemia Replace with IV and oral potassium-magnesium is 1.3 so will replace that with 2 g in addition and check again in the morning Diarrhea on admission Unlikely to be infectious seems to have stopped Could be the cause for hypokalemia GI pathogen panel is pending however C. difficile is negative-as he is not having any real pharyngeal further issues we will  discontinue antibiotics today Hepatic hemangioma? Patient will need outpatient MRI to characterize the mass once all of his other issues settle down Alcoholic cirrhosis [id number] Recommend fibrosure-4/elastography in the outpatient testing INR is 1.6 If cessation can be accomplished by the patient over 6 months he may be able to get on to a liver list if it is felt that this is necessary as an outpatient by PCP Acute blood loss anemia Recheck labs a.m. and if less than 7 will transfuse Starting p.o. iron on discharge and once stabilized  Subjective: Awake alert feels better drinking liquids without any distress Consultants:   GI  Objective: Vitals:   02/12/19 1028 02/12/19 1034 02/12/19 1044 02/12/19 1200  BP: 105/61 127/67 120/68 128/73  Pulse: 90 90 88 91  Resp: 20 19 (!) 24 17  Temp: 97.8 F (36.6 C)  98.5 F (36.9 C) 98.5 F (36.9 C)  TempSrc: Temporal  Oral Oral  SpO2: 99% 98% 100% 100%  Weight:  Height:        Intake/Output Summary (Last 24 hours) at 02/12/2019 1403 Last data filed at 02/12/2019 1022 Gross per 24 hour  Intake 1450 ml  Output 1775 ml  Net -325 ml   Filed Weights   02/10/19 2059 02/12/19 0923  Weight: 78.9 kg 80 kg    Examination: Awake alert pleasant in nad no focal deficit no ict no pallor cta b no rales no rhonchi Abdomen soft nontender nonobese no shifting dullness cannot appreciate liver or spleen No lower extremity edema Mildly tremulous  Scheduled Meds: . folic acid  1 mg Oral Daily  . multivitamin with minerals  1 tablet Oral Daily  . potassium chloride  40 mEq Oral Daily  . thiamine  100 mg Oral Daily   Or  . thiamine  100 mg Intravenous Daily   Continuous Infusions: . sodium chloride Stopped (02/11/19 2345)  . cefTRIAXone (ROCEPHIN)  IV Stopped (02/11/19 2054)  . dextrose 5 % 1,000 mL with potassium chloride 40 mEq infusion 75 mL/hr at 02/12/19 1124  . metronidazole 500 mg (02/12/19 1335)  . octreotide  (SANDOSTATIN)     IV infusion 50 mcg/hr (02/12/19 0159)  . pantoprozole (PROTONIX) infusion 8 mg/hr (02/12/19 0157)     LOS: 1 day   Time spent: [place], MD [facility]  02/12/2019, 2:03 PM

## 2019-02-13 DIAGNOSIS — E871 Hypo-osmolality and hyponatremia: Secondary | ICD-10-CM

## 2019-02-13 DIAGNOSIS — D696 Thrombocytopenia, unspecified: Secondary | ICD-10-CM

## 2019-02-13 DIAGNOSIS — R197 Diarrhea, unspecified: Secondary | ICD-10-CM

## 2019-02-13 DIAGNOSIS — K920 Hematemesis: Secondary | ICD-10-CM

## 2019-02-13 DIAGNOSIS — D689 Coagulation defect, unspecified: Secondary | ICD-10-CM

## 2019-02-13 DIAGNOSIS — D62 Acute posthemorrhagic anemia: Secondary | ICD-10-CM

## 2019-02-13 DIAGNOSIS — K7030 Alcoholic cirrhosis of liver without ascites: Principal | ICD-10-CM

## 2019-02-13 DIAGNOSIS — E876 Hypokalemia: Secondary | ICD-10-CM

## 2019-02-13 LAB — COMPREHENSIVE METABOLIC PANEL
ALT: 24 U/L (ref 0–44)
AST: 61 U/L — ABNORMAL HIGH (ref 15–41)
Albumin: 2.3 g/dL — ABNORMAL LOW (ref 3.5–5.0)
Alkaline Phosphatase: 43 U/L (ref 38–126)
Anion gap: 7 (ref 5–15)
BUN: 8 mg/dL (ref 6–20)
CO2: 22 mmol/L (ref 22–32)
Calcium: 7.2 mg/dL — ABNORMAL LOW (ref 8.9–10.3)
Chloride: 103 mmol/L (ref 98–111)
Creatinine, Ser: 0.72 mg/dL (ref 0.61–1.24)
GFR calc Af Amer: >60 mL/min (ref >60)
GFR calc non Af Amer: >60 mL/min (ref >60)
Glucose, Bld: 112 mg/dL — ABNORMAL HIGH (ref 70–99)
Potassium: 3.1 mmol/L — ABNORMAL LOW (ref 3.5–5.1)
Sodium: 132 mmol/L — ABNORMAL LOW (ref 135–145)
Total Bilirubin: 2.6 mg/dL — ABNORMAL HIGH (ref 0.3–1.2)
Total Protein: 5 g/dL — ABNORMAL LOW (ref 6.5–8.1)

## 2019-02-13 LAB — CBC WITH DIFFERENTIAL/PLATELET
Abs Immature Granulocytes: 0.03 10*3/uL (ref 0.00–0.07)
Basophils Absolute: 0.1 10*3/uL (ref 0.0–0.1)
Basophils Relative: 2 %
Eosinophils Absolute: 0.3 10*3/uL (ref 0.0–0.5)
Eosinophils Relative: 5 %
HCT: 21.5 % — ABNORMAL LOW (ref 39.0–52.0)
Hemoglobin: 6.6 g/dL — CL (ref 13.0–17.0)
Immature Granulocytes: 1 %
Lymphocytes Relative: 19 %
Lymphs Abs: 1.1 10*3/uL (ref 0.7–4.0)
MCH: 23.9 pg — ABNORMAL LOW (ref 26.0–34.0)
MCHC: 30.7 g/dL (ref 30.0–36.0)
MCV: 77.9 fL — ABNORMAL LOW (ref 80.0–100.0)
Monocytes Absolute: 0.5 10*3/uL (ref 0.1–1.0)
Monocytes Relative: 9 %
Neutro Abs: 3.8 10*3/uL (ref 1.7–7.7)
Neutrophils Relative %: 64 %
Platelets: 91 10*3/uL — ABNORMAL LOW (ref 150–400)
RBC: 2.76 MIL/uL — ABNORMAL LOW (ref 4.22–5.81)
RDW: 22.3 % — ABNORMAL HIGH (ref 11.5–15.5)
WBC: 5.7 10*3/uL (ref 4.0–10.5)
nRBC: 0 % (ref 0.0–0.2)

## 2019-02-13 LAB — HEMOGLOBIN AND HEMATOCRIT, BLOOD
HCT: 26.5 % — ABNORMAL LOW (ref 39.0–52.0)
Hemoglobin: 8.3 g/dL — ABNORMAL LOW (ref 13.0–17.0)

## 2019-02-13 LAB — GI PATHOGEN PANEL BY PCR, STOOL

## 2019-02-13 LAB — PROTIME-INR
INR: 1.7 — ABNORMAL HIGH (ref 0.8–1.2)
Prothrombin Time: 20 seconds — ABNORMAL HIGH (ref 11.4–15.2)

## 2019-02-13 LAB — MAGNESIUM: Magnesium: 1.7 mg/dL (ref 1.7–2.4)

## 2019-02-13 LAB — PREPARE RBC (CROSSMATCH)

## 2019-02-13 MED ORDER — PROPRANOLOL HCL 10 MG PO TABS
10.0000 mg | ORAL_TABLET | Freq: Two times a day (BID) | ORAL | Status: DC
  Administered 2019-02-13 – 2019-02-15 (×4): 10 mg via ORAL
  Filled 2019-02-13 (×6): qty 1

## 2019-02-13 MED ORDER — CIPROFLOXACIN HCL 500 MG PO TABS
500.0000 mg | ORAL_TABLET | Freq: Two times a day (BID) | ORAL | Status: DC
  Administered 2019-02-13 – 2019-02-15 (×4): 500 mg via ORAL
  Filled 2019-02-13 (×4): qty 1

## 2019-02-13 MED ORDER — MAGNESIUM OXIDE 400 (241.3 MG) MG PO TABS
200.0000 mg | ORAL_TABLET | Freq: Every day | ORAL | Status: DC
  Administered 2019-02-13: 200 mg via ORAL
  Filled 2019-02-13: qty 1

## 2019-02-13 MED ORDER — SODIUM CHLORIDE 0.9% IV SOLUTION: Freq: Once | INTRAVENOUS | Status: DC

## 2019-02-13 MED ORDER — VITAMIN K1 10 MG/ML IJ SOLN
10.0000 mg | Freq: Once | INTRAVENOUS | Status: AC
  Administered 2019-02-13: 10 mg via INTRAVENOUS
  Filled 2019-02-13: qty 1

## 2019-02-13 NOTE — Progress Notes (Addendum)
[name]  PROGRESS NOTE    Joshua Hubbard  [id number] DOB: [birth year] DOA: 02/10/2019 PCP: [name], MD   Brief Narrative:   44 year old white male known history of chronic EtOH, cirrhosis, TCP, electrolyte derangements, hepatic hemangioma?  (Needs MRI of liver when in nonacute setting) admitted with frank hematemesis Previous hospitalization 10/05/2018-10/07/2018 Transfused 4 units PRBC antiemetics, IV PPI Sandostatin and received FFP vitamin K x2 2/2 INR greater than 10 Also significant diarrhea in ED which is since stopped  02/13/19: Reports that difficult to sleep ON, but denies any other complaints. His Hgb was 6.6 this AM. Denies frank blood in his stool. 2 units pRBCs ordered. K+ looking better. Hold D5WK+ as his Na is low, but will continue PO K+ supplementation. Convert Mg2+ supplementation to MagOx 400mg  qday. ??continue abx per GI, but was stopped by partner yesterday. Will d/w continuation with them. Has octreotide through tomorrow. Protonix likely to be switched to PO. As BP even out, will start low down propranolol. He is overall feeling well today, but needs some more work.   Assessment & Plan:   Principal Problem:   Upper GI bleed Active Problems:   Hypokalemia   Coagulopathy (HCC)   Sepsis (HCC)   Colitis  Hematemesis from variceal bleed     - Significant bleeding from varices; s/p banding, appreciate GI assistance     - IV octreotide (stops tomorrow), protonix     - diet: FLD     - repeat EGD in 3 - 4 weeks     - propranolol as BP even out     - continuing abx?? D/w GI  Hypokalemia Hypomagensemia Hyponatremia     - K+ 3.1 this AM, continue replacement     - Mg2+ 1.7; can continue lower dose PO supplement     - hold D5/K+ IVF     - watch fluid status; overall ok, ab not particularly distended.  Diarrhea on admission     - Unlikely to be infectious seems to have stopped     - Could be the cause for hypokalemia     - GI PCR/C diff is negative     - imodium PRN   Hepatic hemangioma?     - Patient will need outpatient MRI to characterize the mass once all of his other issues settle down  Alcoholic cirrhosis     - [id number]     - Recommend fibrosure-4/elastography in the outpatient testing     - INR is 1.7     - If cessation can be accomplished by the patient over 6 months he may be able to get on to a liver list if it is felt that this is necessary as an outpatient by PCP  Acute blood loss anemia Thrombocytopenia Coagulopathy     - Hgb 6.6 this AM, transfuse 2 units pRBCs     - INR is 1.7 this AM, monitor     - thrombocytopenia likely d/t liver disease, monitor     - Starting p.o. iron on discharge and once stabilized  DVT prophylaxis: SCDs Code Status: FULL Family Communication: None at bedside.   Disposition Plan: Still needs stabilization of Hgb. See above.   Consultants:   GI  Procedures:   EGD   ROS:  Denies CP, dyspnea, N, V. Reports some mild RUQ tenderness on exam . Remainder 10-pt ROS is negative for all not previously mentioned.  Subjective: "I'm ok."  Objective: Vitals:   02/12/19 2355 02/13/19 [id number] 02/13/19 [id number]  02/13/19 0800  BP: 114/68 102/61 109/70   Pulse: 97 93 95 100  Resp:   17   Temp: 98.9 F (37.2 C) 99.3 F (37.4 C) 98.6 F (37 C)   TempSrc: Oral Oral Oral   SpO2:  96% 95%   Weight:      Height:        Intake/Output Summary (Last 24 hours) at 02/13/2019 [id number] Last data filed at 02/12/2019 2345 Gross per 24 hour  Intake 2559.94 ml  Output 550 ml  Net 2009.94 ml   Filed Weights   02/10/19 2059 02/12/19 0923  Weight: 78.9 kg 80 kg    Examination:  General: 44 y.o. male resting in bed in NAD Cardiovascular: RRR, +S1, S2, no m/g/r, equal pulses throughout Respiratory: CTABL, no w/r/r, normal WOB GI: BS+, ND, RUQ TTP mild ("just sore"), no masses noted MSK: No e/c/c Neuro: Alert to name, follows commands Psyc: Appropriate interaction and affect, calm/cooperative   Data Reviewed: I have  personally reviewed following labs and imaging studies.  CBC: Recent Labs  Lab 02/10/19 2108 02/11/19 1321 02/11/19 1958 02/12/19 0545 02/13/19 0319  WBC 10.6* 6.1 5.4 4.6 5.7  NEUTROABS 7.6 3.8 3.0  --  3.8  HGB 4.6* 5.2* 7.0* 7.3* 6.6*  HCT 16.8* 17.1* 22.0* 23.3* 21.5*  MCV 70.9* 74.0* 78.0* 76.9* 77.9*  PLT 124* 88* 87* 87* 91*   Basic Metabolic Panel: Recent Labs  Lab 02/10/19 2108 02/11/19 1235 02/12/19 0545 02/12/19 0800 02/13/19 0319  NA 136  --  141  --  132*  K 3.2*  --  2.9*  --  3.1*  CL 104  --  110  --  103  CO2 21*  --  24  --  22  GLUCOSE 138*  --  92  --  112*  BUN 20  --  16  --  8  CREATININE 0.77  --  0.87  --  0.72  CALCIUM 7.7*  --  7.5*  --  7.2*  MG  --  0.9*  --  1.3* 1.7  PHOS  --  2.3*  --   --   --    GFR: Estimated Creatinine Clearance: 117.8 mL/min (by C-G formula based on SCr of 0.72 mg/dL). Liver Function Tests: Recent Labs  Lab 02/10/19 2108 02/12/19 0545 02/13/19 0319  AST 34 61* 61*  ALT 18 21 24   ALKPHOS 47 47 43  BILITOT 1.9* 2.9* 2.6*  PROT 6.0* 5.3* 5.0*  ALBUMIN 2.6* 2.4* 2.3*   Recent Labs  Lab 02/10/19 2108  LIPASE 42   No results for input(s): AMMONIA in the last 168 hours. Coagulation Profile: Recent Labs  Lab 02/10/19 2120 02/11/19 1235 02/12/19 0545 02/13/19 0319  INR >10.0* 3.1* 1.6* 1.7*   Cardiac Enzymes: No results for input(s): CKTOTAL, CKMB, CKMBINDEX, TROPONINI in the last 168 hours. BNP (last 3 results) No results for input(s): PROBNP in the last 8760 hours. HbA1C: No results for input(s): HGBA1C in the last 72 hours. CBG: No results for input(s): GLUCAP in the last 168 hours. Lipid Profile: No results for input(s): CHOL, HDL, LDLCALC, TRIG, CHOLHDL, LDLDIRECT in the last 72 hours. Thyroid Function Tests: No results for input(s): TSH, T4TOTAL, FREET4, T3FREE, THYROIDAB in the last 72 hours. Anemia Panel: No results for input(s): VITAMINB12, FOLATE, FERRITIN, TIBC, IRON, RETICCTPCT in  the last 72 hours. Sepsis Labs: Recent Labs  Lab 02/10/19 2120  LATICACIDVEN 3.8*    Recent Results (from the past 240 hour(s))  Blood Culture (routine x 2)     Status: None (Preliminary result)   Collection Time: 02/10/19  9:20 PM   Specimen: BLOOD  Result Value Ref Range Status   Specimen Description BLOOD RIGHT ANTECUBITAL  Final   Special Requests   Final    BOTTLES DRAWN AEROBIC AND ANAEROBIC Blood Culture adequate volume   Culture   Final    NO GROWTH 3 DAYS Performed at [facility], [address] [place], [place] [address]    Report Status PENDING  Incomplete  Culture, blood (Routine X 2) w Reflex to ID Panel     Status: None (Preliminary result)   Collection Time: 02/10/19 10:00 PM   Specimen: BLOOD  Result Value Ref Range Status   Specimen Description BLOOD LEFT ANTECUBITAL  Final   Special Requests   Final    BOTTLES DRAWN AEROBIC AND ANAEROBIC Blood Culture adequate volume   Culture   Final    NO GROWTH 2 DAYS Performed at [facility], [place] [address], [place], [place] [address]    Report Status PENDING  Incomplete  C difficile quick scan w PCR reflex     Status: None   Collection Time: 02/10/19 11:22 PM   Specimen: STOOL  Result Value Ref Range Status   C Diff antigen NEGATIVE NEGATIVE Final   C Diff toxin NEGATIVE NEGATIVE Final   C Diff interpretation No C. difficile detected.  Final    Comment: Performed at [facility], [place] [address] [place], [place] [address]  Urine culture     Status: None   Collection Time: 02/10/19 11:26 PM   Specimen: In/Out Cath Urine  Result Value Ref Range Status   Specimen Description IN/OUT CATH URINE  Final   Special Requests NONE  Final   Culture   Final    NO GROWTH Performed at [facility], [place] [address] [place], [place] [address]    Report Status 02/12/2019 FINAL  Final  GI pathogen panel by PCR, stool     Status: None   Collection Time: 02/10/19 11:29 PM   Specimen: Stool  Result  Value Ref Range Status   Plesiomonas shigelloides NOT DETECTED NOT DETECTED Final   Yersinia enterocolitica NOT DETECTED NOT DETECTED Final   Vibrio NOT DETECTED NOT DETECTED Final   Enteropathogenic E coli NOT DETECTED NOT DETECTED Final   E coli (ETEC) LT/ST NOT DETECTED NOT DETECTED Final   E coli [id number] by PCR Not applicable NOT DETECTED Final   Cryptosporidium by PCR NOT DETECTED NOT DETECTED Final   Entamoeba histolytica NOT DETECTED NOT DETECTED Final   Adenovirus F 40/41 NOT DETECTED NOT DETECTED Final   Norovirus GI/GII NOT DETECTED NOT DETECTED Final   Sapovirus NOT DETECTED NOT DETECTED Final    Comment: (NOTE) Performed At: [facility] [place], [place] [id number] Rush Farmer MD [id number]    Vibrio cholerae NOT DETECTED NOT DETECTED Final   Campylobacter by PCR NOT DETECTED NOT DETECTED Final   Salmonella by PCR NOT DETECTED NOT DETECTED Final   E coli (STEC) NOT DETECTED NOT DETECTED Final   Enteroaggregative E coli NOT DETECTED NOT DETECTED Final   Shigella by PCR NOT DETECTED NOT DETECTED Final   Cyclospora cayetanensis NOT DETECTED NOT DETECTED Final   Astrovirus NOT DETECTED NOT DETECTED Final   G lamblia by PCR NOT DETECTED NOT DETECTED Final   Rotavirus A by PCR NOT DETECTED NOT DETECTED Final  Respiratory  Panel by RT PCR (Flu A&B, Covid) - Nasopharyngeal Swab     Status: None   Collection Time: 02/10/19 11:54 PM   Specimen: Nasopharyngeal Swab  Result Value Ref Range Status   SARS Coronavirus 2 by RT PCR NEGATIVE NEGATIVE Final    Comment: (NOTE) SARS-CoV-2 target nucleic acids are NOT DETECTED. The SARS-CoV-2 RNA is generally detectable in upper respiratoy specimens during the acute phase of infection. The lowest concentration of SARS-CoV-2 viral copies this assay can detect is 131 copies/mL. A negative result does not preclude SARS-Cov-2 infection and should not be used as the sole basis for treatment or other patient management  decisions. A negative result may occur with  improper specimen collection/handling, submission of specimen other than nasopharyngeal swab, presence of viral mutation(s) within the areas targeted by this assay, and inadequate number of viral copies (<131 copies/mL). A negative result must be combined with clinical observations, patient history, and epidemiological information. The expected result is Negative. Fact Sheet for Patients:  PinkCheek.be Fact Sheet for Healthcare Providers:  GravelBags.it This test is not yet ap proved or cleared by the [place] FDA and  has been authorized for detection and/or diagnosis of SARS-CoV-2 by FDA under an Emergency Use Authorization (EUA). This EUA will remain  in effect (meaning this test can be used) for the duration of the COVID-19 declaration under Section 564(b)(1) of the Act, 21 U.S.C. section 360bbb-3(b)(1), unless the authorization is terminated or revoked sooner.    Influenza A by PCR NEGATIVE NEGATIVE Final   Influenza B by PCR NEGATIVE NEGATIVE Final    Comment: (NOTE) The Xpert Xpress SARS-CoV-2/FLU/RSV assay is intended as an aid in  the diagnosis of influenza from Nasopharyngeal swab specimens and  should not be used as a sole basis for treatment. Nasal washings and  aspirates are unacceptable for Xpert Xpress SARS-CoV-2/FLU/RSV  testing. Fact Sheet for Patients: PinkCheek.be Fact Sheet for Healthcare Providers: GravelBags.it This test is not yet approved or cleared by the [place] FDA and  has been authorized for detection and/or diagnosis of SARS-CoV-2 by  FDA under an Emergency Use Authorization (EUA). This EUA will remain  in effect (meaning this test can be used) for the duration of the  Covid-19 declaration under Section 564(b)(1) of the Act, 21  U.S.C. section 360bbb-3(b)(1), unless the authorization  is  terminated or revoked. Performed at [facility], [place] [address], [place], [place] [address]       Radiology Studies: No results found.   Scheduled Meds: . sodium chloride   Intravenous Once  . folic acid  1 mg Oral Daily  . multivitamin with minerals  1 tablet Oral Daily  . potassium chloride  40 mEq Oral Daily  . thiamine  100 mg Oral Daily   Or  . thiamine  100 mg Intravenous Daily   Continuous Infusions: . sodium chloride Stopped (02/11/19 2345)  . dextrose 5 % 1,000 mL with potassium chloride 40 mEq infusion 75 mL/hr at 02/13/19 0524  . octreotide  (SANDOSTATIN)    IV infusion 50 mcg/hr (02/13/19 0135)  . pantoprozole (PROTONIX) infusion 8 mg/hr (02/12/19 2253)     LOS: 2 days    Time spent: 25 minutes spent in the coordination of care today.    Jonnie Finner, DO [facility]  If 7PM-7AM, please contact night-coverage [email or web address] 02/13/2019, 8:03 AM

## 2019-02-13 NOTE — Progress Notes (Signed)
Result received from lab via telephone hemoglobin=6.6, MD notified via text.

## 2019-02-13 NOTE — Progress Notes (Signed)
[facility] Progress Note  Joshua Hubbard 44 y.o. [birth year]  CC: GI bleed   Subjective: Underwent EGD with band ligation yesterday.  Doing much better today.  Had 2 bowel movements which was nonbloody.  Had some nausea yesterday but has resolved now.  Tolerating diet.  Denies any further diarrhea  ROS : Afebrile, negative for chest pain and shortness of breath   Objective: Vital signs in last 24 hours: Vitals:   02/13/19 1111 02/13/19 1133  BP: 111/67 104/67  Pulse:  87  Resp:  20  Temp: 98.4 F (36.9 C) 99 F (37.2 C)  SpO2: 96% 94%    Physical Exam:  General:  Alert, cooperative, no distress, appears stated age  Head:  Normocephalic, without obvious abnormality, atraumatic  Eyes:  , EOM's intact,   Lungs:   Clear to auscultation bilaterally, respirations unlabored  Heart:  Regular rate and rhythm, S1, S2 normal  Abdomen:   Soft, non-tender, mild distended, bowel sounds present, no peritoneal signs  Extremities: Extremities normal, atraumatic, no  edema  Pulses: 2+ and symmetric    Lab Results: Recent Labs    02/10/19 2108 02/11/19 1235 02/11/19 1235 02/12/19 0545 02/12/19 0800 02/13/19 0319  NA   < >  --   --  141  --  132*  K   < >  --   --  2.9*  --  3.1*  CL   < >  --   --  110  --  103  CO2   < >  --   --  24  --  22  GLUCOSE   < >  --   --  92  --  112*  BUN   < >  --   --  16  --  8  CREATININE   < >  --   --  0.87  --  0.72  CALCIUM   < >  --   --  7.5*  --  7.2*  MG  --  0.9*   < >  --  1.3* 1.7  PHOS  --  2.3*  --   --   --   --    < > = values in this interval not displayed.   Recent Labs    02/12/19 0545 02/13/19 0319  AST 61* 61*  ALT 21 24  ALKPHOS 47 43  BILITOT 2.9* 2.6*  PROT 5.3* 5.0*  ALBUMIN 2.4* 2.3*   Recent Labs    02/11/19 1958 02/11/19 1958 02/12/19 0545 02/13/19 0319  WBC 5.4   < > 4.6 5.7  NEUTROABS 3.0  --   --  3.8  HGB 7.0*   < > 7.3* 6.6*  HCT 22.0*   < > 23.3* 21.5*  MCV 78.0*   < > 76.9* 77.9*   PLT 87*   < > 87* 91*   < > = values in this interval not displayed.   Recent Labs    02/12/19 0545 02/13/19 0319  LABPROT 19.3* 20.0*  INR 1.6* 1.7*      Assessment/Plan: - hematemesis with EGD yesterday showing esophageal varices status post band ligation yesterday.  Mild drop in hemoglobin noted but patient declines any further bleeding.  -Coagulopathy.  INR more than 10 on admission..  No signs of acute liver failure as transaminases are normal and he is alert/oriented x3.  Improving now.  INR 1.7 today  -Acute blood loss anemia. -Diarrhea. ?  etiology.  Resolved  CT scan  showed wall thickening of the colon which could be infectious/inflammatory or from portal hypertension.  GI pathogen panel and C. difficile negative.  Recommendations ------------------------ -Continue octreotide drip. -Change Protonix to IV twice daily -Start ciprofloxacin 500 mg twice a day for  5 days -Start propranolol 10 mg twice daily -No need for inpatient colonoscopy. -Recommend repeat EGD in 3 to 4 weeks for further treatment of esophageal varices. -Transfuse 1 unit of blood -Repeat labs in the morning. -GI will follow   [name] MD, [place] 02/13/2019, 11:54 AM  Contact #  [phone]

## 2019-02-14 LAB — BPAM RBC
Blood Product Expiration Date: 202101242359
Blood Product Expiration Date: 202101242359
Blood Product Expiration Date: 202101242359
Blood Product Expiration Date: 202101262359
Blood Product Expiration Date: 202101262359
Blood Product Expiration Date: 202102092359
Blood Product Expiration Date: 202102182359
Blood Product Expiration Date: 202102192359
Blood Product Expiration Date: 202102222359
ISSUE DATE / TIME: 202101172212
ISSUE DATE / TIME: 202101180119
ISSUE DATE / TIME: 202101181426
ISSUE DATE / TIME: 202101181802
ISSUE DATE / TIME: 202101200757
ISSUE DATE / TIME: 202101201140
ISSUE DATE / TIME: 202101201454
Unit Type and Rh: 6200
Unit Type and Rh: 6200
Unit Type and Rh: 6200
Unit Type and Rh: 6200
Unit Type and Rh: 6200
Unit Type and Rh: 8400
Unit Type and Rh: 8400
Unit Type and Rh: 8400
Unit Type and Rh: 8400

## 2019-02-14 LAB — TYPE AND SCREEN
ABO/RH(D): AB POS
Antibody Screen: NEGATIVE
Unit division: 0
Unit division: 0
Unit division: 0
Unit division: 0
Unit division: 0
Unit division: 0
Unit division: 0
Unit division: 0
Unit division: 0

## 2019-02-14 LAB — CBC WITH DIFFERENTIAL/PLATELET
Abs Immature Granulocytes: 0.01 10*3/uL (ref 0.00–0.07)
Basophils Absolute: 0.1 10*3/uL (ref 0.0–0.1)
Basophils Relative: 2 %
Eosinophils Absolute: 0.2 10*3/uL (ref 0.0–0.5)
Eosinophils Relative: 4 %
HCT: 25.8 % — ABNORMAL LOW (ref 39.0–52.0)
Hemoglobin: 8 g/dL — ABNORMAL LOW (ref 13.0–17.0)
Immature Granulocytes: 0 %
Lymphocytes Relative: 17 %
Lymphs Abs: 0.9 10*3/uL (ref 0.7–4.0)
MCH: 25 pg — ABNORMAL LOW (ref 26.0–34.0)
MCHC: 31 g/dL (ref 30.0–36.0)
MCV: 80.6 fL (ref 80.0–100.0)
Monocytes Absolute: 0.6 10*3/uL (ref 0.1–1.0)
Monocytes Relative: 11 %
Neutro Abs: 3.5 10*3/uL (ref 1.7–7.7)
Neutrophils Relative %: 66 %
Platelets: 94 10*3/uL — ABNORMAL LOW (ref 150–400)
RBC: 3.2 MIL/uL — ABNORMAL LOW (ref 4.22–5.81)
RDW: 21.7 % — ABNORMAL HIGH (ref 11.5–15.5)
WBC: 5.3 10*3/uL (ref 4.0–10.5)
nRBC: 0 % (ref 0.0–0.2)

## 2019-02-14 LAB — MAGNESIUM: Magnesium: 1.6 mg/dL — ABNORMAL LOW (ref 1.7–2.4)

## 2019-02-14 LAB — PHOSPHORUS: Phosphorus: 3.2 mg/dL (ref 2.5–4.6)

## 2019-02-14 LAB — COMPREHENSIVE METABOLIC PANEL
ALT: 22 U/L (ref 0–44)
AST: 51 U/L — ABNORMAL HIGH (ref 15–41)
Albumin: 2.3 g/dL — ABNORMAL LOW (ref 3.5–5.0)
Alkaline Phosphatase: 47 U/L (ref 38–126)
Anion gap: 7 (ref 5–15)
BUN: <5 mg/dL — ABNORMAL LOW (ref 6–20)
CO2: 22 mmol/L (ref 22–32)
Calcium: 7.4 mg/dL — ABNORMAL LOW (ref 8.9–10.3)
Chloride: 108 mmol/L (ref 98–111)
Creatinine, Ser: 0.66 mg/dL (ref 0.61–1.24)
GFR calc Af Amer: >60 mL/min (ref >60)
GFR calc non Af Amer: >60 mL/min (ref >60)
Glucose, Bld: 93 mg/dL (ref 70–99)
Potassium: 3.4 mmol/L — ABNORMAL LOW (ref 3.5–5.1)
Sodium: 137 mmol/L (ref 135–145)
Total Bilirubin: 3.3 mg/dL — ABNORMAL HIGH (ref 0.3–1.2)
Total Protein: 5.1 g/dL — ABNORMAL LOW (ref 6.5–8.1)

## 2019-02-14 LAB — PROTIME-INR
INR: 1.8 — ABNORMAL HIGH (ref 0.8–1.2)
Prothrombin Time: 20.8 seconds — ABNORMAL HIGH (ref 11.4–15.2)

## 2019-02-14 MED ORDER — MAGNESIUM OXIDE 400 (241.3 MG) MG PO TABS
400.0000 mg | ORAL_TABLET | Freq: Every day | ORAL | Status: DC
  Administered 2019-02-14 – 2019-02-15 (×2): 400 mg via ORAL
  Filled 2019-02-14 (×2): qty 1

## 2019-02-14 MED ORDER — CLONAZEPAM 0.5 MG PO TABS
0.5000 mg | ORAL_TABLET | Freq: Every day | ORAL | Status: DC
  Administered 2019-02-14: 0.5 mg via ORAL
  Filled 2019-02-14: qty 1

## 2019-02-14 MED ORDER — MELATONIN 3 MG PO TABS
6.0000 mg | ORAL_TABLET | Freq: Every day | ORAL | Status: DC
  Administered 2019-02-14: 6 mg via ORAL
  Filled 2019-02-14 (×2): qty 2

## 2019-02-14 MED ORDER — PANTOPRAZOLE SODIUM 40 MG IV SOLR
40.0000 mg | Freq: Two times a day (BID) | INTRAVENOUS | Status: DC
  Administered 2019-02-14 – 2019-02-15 (×3): 40 mg via INTRAVENOUS
  Filled 2019-02-14 (×3): qty 40

## 2019-02-14 MED ORDER — VITAMIN K1 10 MG/ML IJ SOLN
10.0000 mg | Freq: Once | INTRAVENOUS | Status: AC
  Administered 2019-02-14: 10 mg via INTRAVENOUS
  Filled 2019-02-14: qty 1

## 2019-02-14 MED ORDER — POTASSIUM CHLORIDE CRYS ER 20 MEQ PO TBCR
40.0000 meq | EXTENDED_RELEASE_TABLET | Freq: Two times a day (BID) | ORAL | Status: DC
  Administered 2019-02-14 – 2019-02-15 (×3): 40 meq via ORAL
  Filled 2019-02-14 (×3): qty 2

## 2019-02-14 NOTE — Progress Notes (Signed)
[name] [facility]  PROGRESS NOTE    Joshua Hubbard  [id number] DOB: [birth year] DOA: 02/10/2019 PCP: [name], MD   Brief Narrative:   44 year old white male known history of chronic EtOH, cirrhosis, TCP, electrolyte derangements, hepatic hemangioma? (Needs MRI of liver when in nonacute setting) admitted with frank hematemesis Previous hospitalization 10/05/2018-10/07/2018 Transfused 4 units PRBC antiemetics, IV PPI Sandostatin and received FFP vitamin K x2 2/2 INR greater than 10 Also significant diarrhea in ED which is since stopped  02/14/19: No acute events ON. He reports 2 BM w/o blood; not tarry or sticky. He notes that there were 2 "rubber pieces" in his stool. Na+ improved this AM. K+ is still on the low side, will supplement. Mg2+ is on the low side, will increase supplement. Decent response to transfusion yesterday. Hgb is 8.0 this AM (slightly down from last night). Will continue protonix IV BID and octreotide for now. Cipro x 5 days. Appreciate GI assistance. Add melatonin and klonopin qHS for insomina/anxiety.   Assessment & Plan:   Principal Problem:   Upper GI bleed Active Problems:   Hypokalemia   Coagulopathy (HCC)   Sepsis (HCC)   Colitis  Hematemesis from variceal bleed     - Significant bleeding from varices; s/p banding, appreciate GI assistance     - IV octreotide, protonix     - diet: FLD     - repeat EGD in 3 - 4 weeks     - propranolol 10mg  BID     - cipro 500mg  BID x 5 days  Hypokalemia Hypomagensemia Hyponatremia     - K+ 3.4 this AM, continue replacement     - Mg2+ 1.6; continue replacement     - Na+ is normal, monito  Diarrhea on admission     - Unlikely to be infectious seems to have stopped     - Could be the cause for hypokalemia     - GI PCR/C diff is negative     - imodium PRN  Hepatic hemangioma?     - Patient will need outpatient MRI to characterize the mass once all of his other issues settle down  Alcoholic cirrhosis     - [id number]   - Recommend fibrosure-4/elastography in the outpatient testing     - INR is 1.8     - If cessation can be accomplished by the patient over 6 months he may be able to get on to a liver list if it is felt that this is necessary as an outpatient by PCP  Acute blood loss anemia Thrombocytopenia Coagulopathy     - Hgb 8.0 this AM, s/p 2 units pRBCs 02/13/19; continue to monitor H&H     - INR is 1.8 this AM, monitor     - thrombocytopenia likely d/t liver disease, monitor     - Starting p.o. iron on discharge  Anxiety Insomnia Restlessness     - melatonin, klonopin qHS  Let's see if we can get him mobile in the hallways today.   DVT prophylaxis: SCDs Code Status: FULL Family Communication: None at bedside   Disposition Plan: Still on IV octreotide, protonix. See above.  Consultants:   GI  Procedures:   EGD  Antimicrobials:  . Cipro   ROS:  Reports anxiety, insomnia. Denies CP, N, V, ab pain, D. Remainder 10-pt ROS is negative for all not previously mentioned.  Subjective: "Just hard to sleep. I feel restless."  Objective: Vitals:   02/13/19 2129 02/14/19 0000 02/14/19 [id number]  02/14/19 0747  BP: 116/61 108/76  111/64  Pulse: 96   82  Resp:    15  Temp:  99.7 F (37.6 C) 99.8 F (37.7 C) 98.8 F (37.1 C)  TempSrc:  Oral Oral Oral  SpO2:    93%  Weight:      Height:        Intake/Output Summary (Last 24 hours) at 02/14/2019 0856 Last data filed at 02/14/2019 [id number] Gross per 24 hour  Intake 2042.94 ml  Output 750 ml  Net 1292.94 ml   Filed Weights   02/10/19 2059 02/12/19 0923  Weight: 78.9 kg 80 kg    Examination:  General: 45 y.o. male resting in bed in NAD Cardiovascular: RRR, +S1, S2, no m/g/r, equal pulses throughout Respiratory: CTABL, no w/r/r GI: BS+, ND, mild epigastric TTP, soft MSK: No e/c/c Neuro: A&O x 3, no focal deficits Psyc: Appropriate interaction and affect, calm/cooperative   Data Reviewed: I have personally reviewed following labs  and imaging studies.  CBC: Recent Labs  Lab 02/10/19 2108 02/10/19 2108 02/11/19 1321 02/11/19 1321 02/11/19 1958 02/12/19 0545 02/13/19 0319 02/13/19 1828 02/14/19 0320  WBC 10.6*   < > 6.1  --  5.4 4.6 5.7  --  5.3  NEUTROABS 7.6  --  3.8  --  3.0  --  3.8  --  3.5  HGB 4.6*   < > 5.2*   < > 7.0* 7.3* 6.6* 8.3* 8.0*  HCT 16.8*   < > 17.1*   < > 22.0* 23.3* 21.5* 26.5* 25.8*  MCV 70.9*   < > 74.0*  --  78.0* 76.9* 77.9*  --  80.6  PLT 124*   < > 88*  --  87* 87* 91*  --  94*   < > = values in this interval not displayed.   Basic Metabolic Panel: Recent Labs  Lab 02/10/19 2108 02/11/19 1235 02/12/19 0545 02/12/19 0800 02/13/19 0319 02/14/19 0320  NA 136  --  141  --  132* 137  K 3.2*  --  2.9*  --  3.1* 3.4*  CL 104  --  110  --  103 108  CO2 21*  --  24  --  22 22  GLUCOSE 138*  --  92  --  112* 93  BUN 20  --  16  --  8 <5*  CREATININE 0.77  --  0.87  --  0.72 0.66  CALCIUM 7.7*  --  7.5*  --  7.2* 7.4*  MG  --  0.9*  --  1.3* 1.7 1.6*  PHOS  --  2.3*  --   --   --  3.2   GFR: Estimated Creatinine Clearance: 117.8 mL/min (by C-G formula based on SCr of 0.66 mg/dL). Liver Function Tests: Recent Labs  Lab 02/10/19 2108 02/12/19 0545 02/13/19 0319 02/14/19 0320  AST 34 61* 61* 51*  ALT 18 21 24 22   ALKPHOS 47 47 43 47  BILITOT 1.9* 2.9* 2.6* 3.3*  PROT 6.0* 5.3* 5.0* 5.1*  ALBUMIN 2.6* 2.4* 2.3* 2.3*   Recent Labs  Lab 02/10/19 2108  LIPASE 42   No results for input(s): AMMONIA in the last 168 hours. Coagulation Profile: Recent Labs  Lab 02/10/19 2120 02/11/19 1235 02/12/19 0545 02/13/19 0319 02/14/19 0320  INR >10.0* 3.1* 1.6* 1.7* 1.8*   Cardiac Enzymes: No results for input(s): CKTOTAL, CKMB, CKMBINDEX, TROPONINI in the last 168 hours. BNP (last 3 results) No results for input(s): PROBNP in  the last 8760 hours. HbA1C: No results for input(s): HGBA1C in the last 72 hours. CBG: No results for input(s): GLUCAP in the last 168  hours. Lipid Profile: No results for input(s): CHOL, HDL, LDLCALC, TRIG, CHOLHDL, LDLDIRECT in the last 72 hours. Thyroid Function Tests: No results for input(s): TSH, T4TOTAL, FREET4, T3FREE, THYROIDAB in the last 72 hours. Anemia Panel: No results for input(s): VITAMINB12, FOLATE, FERRITIN, TIBC, IRON, RETICCTPCT in the last 72 hours. Sepsis Labs: Recent Labs  Lab 02/10/19 2120  LATICACIDVEN 3.8*    Recent Results (from the past 240 hour(s))  Blood Culture (routine x 2)     Status: None (Preliminary result)   Collection Time: 02/10/19  9:20 PM   Specimen: BLOOD  Result Value Ref Range Status   Specimen Description BLOOD RIGHT ANTECUBITAL  Final   Special Requests   Final    BOTTLES DRAWN AEROBIC AND ANAEROBIC Blood Culture adequate volume   Culture   Final    NO GROWTH 4 DAYS Performed at [facility], [address] [place], [place] [address]    Report Status PENDING  Incomplete  Culture, blood (Routine X 2) w Reflex to ID Panel     Status: None (Preliminary result)   Collection Time: 02/10/19 10:00 PM   Specimen: BLOOD  Result Value Ref Range Status   Specimen Description BLOOD LEFT ANTECUBITAL  Final   Special Requests   Final    BOTTLES DRAWN AEROBIC AND ANAEROBIC Blood Culture adequate volume   Culture   Final    NO GROWTH 3 DAYS Performed at [facility], [place] [address], [place], [place] [address]    Report Status PENDING  Incomplete  C difficile quick scan w PCR reflex     Status: None   Collection Time: 02/10/19 11:22 PM   Specimen: STOOL  Result Value Ref Range Status   C Diff antigen NEGATIVE NEGATIVE Final   C Diff toxin NEGATIVE NEGATIVE Final   C Diff interpretation No C. difficile detected.  Final    Comment: Performed at [facility], [place] [address], [place], [place] [address]  Urine culture     Status: None   Collection Time: 02/10/19 11:26 PM   Specimen: In/Out Cath Urine  Result Value Ref Range Status   Specimen Description  IN/OUT CATH URINE  Final   Special Requests NONE  Final   Culture   Final    NO GROWTH Performed at [facility], [place] [address], [place], [place] [address]    Report Status 02/12/2019 FINAL  Final  GI pathogen panel by PCR, stool     Status: None   Collection Time: 02/10/19 11:29 PM   Specimen: Stool  Result Value Ref Range Status   Plesiomonas shigelloides NOT DETECTED NOT DETECTED Final   Yersinia enterocolitica NOT DETECTED NOT DETECTED Final   Vibrio NOT DETECTED NOT DETECTED Final   Enteropathogenic E coli NOT DETECTED NOT DETECTED Final   E coli (ETEC) LT/ST NOT DETECTED NOT DETECTED Final   E coli [id number] by PCR Not applicable NOT DETECTED Final   Cryptosporidium by PCR NOT DETECTED NOT DETECTED Final   Entamoeba histolytica NOT DETECTED NOT DETECTED Final   Adenovirus F 40/41 NOT DETECTED NOT DETECTED Final   Norovirus GI/GII NOT DETECTED NOT DETECTED Final   Sapovirus NOT DETECTED NOT DETECTED Final    Comment: (NOTE) Performed At: [facility] [place], [place] [id number] Rush Farmer MD [id number]  Vibrio cholerae NOT DETECTED NOT DETECTED Final   Campylobacter by PCR NOT DETECTED NOT DETECTED Final   Salmonella by PCR NOT DETECTED NOT DETECTED Final   E coli (STEC) NOT DETECTED NOT DETECTED Final   Enteroaggregative E coli NOT DETECTED NOT DETECTED Final   Shigella by PCR NOT DETECTED NOT DETECTED Final   Cyclospora cayetanensis NOT DETECTED NOT DETECTED Final   Astrovirus NOT DETECTED NOT DETECTED Final   G lamblia by PCR NOT DETECTED NOT DETECTED Final   Rotavirus A by PCR NOT DETECTED NOT DETECTED Final  Respiratory Panel by RT PCR (Flu A&B, Covid) - Nasopharyngeal Swab     Status: None   Collection Time: 02/10/19 11:54 PM   Specimen: Nasopharyngeal Swab  Result Value Ref Range Status   SARS Coronavirus 2 by RT PCR NEGATIVE NEGATIVE Final    Comment: (NOTE) SARS-CoV-2 target nucleic acids are NOT DETECTED. The SARS-CoV-2 RNA  is generally detectable in upper respiratoy specimens during the acute phase of infection. The lowest concentration of SARS-CoV-2 viral copies this assay can detect is 131 copies/mL. A negative result does not preclude SARS-Cov-2 infection and should not be used as the sole basis for treatment or other patient management decisions. A negative result may occur with  improper specimen collection/handling, submission of specimen other than nasopharyngeal swab, presence of viral mutation(s) within the areas targeted by this assay, and inadequate number of viral copies (<131 copies/mL). A negative result must be combined with clinical observations, patient history, and epidemiological information. The expected result is Negative. Fact Sheet for Patients:  PinkCheek.be Fact Sheet for Healthcare Providers:  GravelBags.it This test is not yet ap proved or cleared by the [place] FDA and  has been authorized for detection and/or diagnosis of SARS-CoV-2 by FDA under an Emergency Use Authorization (EUA). This EUA will remain  in effect (meaning this test can be used) for the duration of the COVID-19 declaration under Section 564(b)(1) of the Act, 21 U.S.C. section 360bbb-3(b)(1), unless the authorization is terminated or revoked sooner.    Influenza A by PCR NEGATIVE NEGATIVE Final   Influenza B by PCR NEGATIVE NEGATIVE Final    Comment: (NOTE) The Xpert Xpress SARS-CoV-2/FLU/RSV assay is intended as an aid in  the diagnosis of influenza from Nasopharyngeal swab specimens and  should not be used as a sole basis for treatment. Nasal washings and  aspirates are unacceptable for Xpert Xpress SARS-CoV-2/FLU/RSV  testing. Fact Sheet for Patients: PinkCheek.be Fact Sheet for Healthcare Providers: GravelBags.it This test is not yet approved or cleared by the [place] FDA and   has been authorized for detection and/or diagnosis of SARS-CoV-2 by  FDA under an Emergency Use Authorization (EUA). This EUA will remain  in effect (meaning this test can be used) for the duration of the  Covid-19 declaration under Section 564(b)(1) of the Act, 21  U.S.C. section 360bbb-3(b)(1), unless the authorization is  terminated or revoked. Performed at [facility], [place] [address], [place], [place] [address]       Radiology Studies: No results found.   Scheduled Meds: . sodium chloride   Intravenous Once  . ciprofloxacin  500 mg Oral BID  . clonazePAM  0.5 mg Oral QHS  . folic acid  1 mg Oral Daily  . magnesium oxide  400 mg Oral Daily  . multivitamin with minerals  1 tablet Oral Daily  . potassium chloride  40 mEq Oral BID  . propranolol  10 mg Oral BID  .  thiamine  100 mg Oral Daily   Continuous Infusions: . sodium chloride Stopped (02/11/19 2345)  . octreotide  (SANDOSTATIN)    IV infusion 50 mcg/hr (02/13/19 1305)  . pantoprazole (PROTONIX) IV       LOS: 3 days    Time spent: 25 minutes spent in the coordination of care today.    Jonnie Finner, DO [facility]  If 7PM-7AM, please contact night-coverage [email or web address] 02/14/2019, 8:56 AM

## 2019-02-14 NOTE — Anesthesia Postprocedure Evaluation (Signed)
Anesthesia Post Note  Patient: Joshua Hubbard  Procedure(s) Performed: ESOPHAGOGASTRODUODENOSCOPY (EGD) WITH PROPOFOL (N/A ) ESOPHAGEAL BANDING     Patient location during evaluation: Endoscopy Anesthesia Type: MAC Level of consciousness: awake and alert Pain management: pain level controlled Vital Signs Assessment: post-procedure vital signs reviewed and stable Respiratory status: spontaneous breathing, nonlabored ventilation, respiratory function stable and patient connected to nasal cannula oxygen Cardiovascular status: blood pressure returned to baseline and stable Postop Assessment: no apparent nausea or vomiting Anesthetic complications: no    Last Vitals:  Vitals:   02/14/19 0349 02/14/19 0747  BP:  111/64  Pulse:  82  Resp:  15  Temp: 37.7 C 37.1 C  SpO2:  93%    Last Pain:  Vitals:   02/14/19 0848  TempSrc:   PainSc: Grandfather

## 2019-02-14 NOTE — Progress Notes (Signed)
[facility] Progress Note  Joshua Hubbard 44 y.o. 1975-02-09  CC: GI bleed   Subjective: Patient seen and examined at bedside.  Feeling much better today.  Tolerating diet.  Denies nausea and vomiting.  Denies abdominal pain.  Denies any further bleeding episodes  ROS : Afebrile, negative for chest pain and shortness of breath   Objective: Vital signs in last 24 hours: Vitals:   02/14/19 0747 02/14/19 1117  BP: 111/64 102/64  Pulse: 82 78  Resp: 15 18  Temp: 98.8 F (37.1 C) 98.3 F (36.8 C)  SpO2: 93% 98%    Physical Exam:  General:  Alert, cooperative, no distress, appears stated age  Head:  Normocephalic, without obvious abnormality, atraumatic  Eyes:  , EOM's intact, mild scleral icterus noted  Lungs:   Clear to auscultation bilaterally, respirations unlabored  Heart:  Regular rate and rhythm, S1, S2 normal  Abdomen:   Soft, non-tender, mild distended, bowel sounds present, no peritoneal signs  Extremities: Extremities normal, atraumatic, no  edema       Lab Results: Recent Labs    02/13/19 0319 02/14/19 0320  NA 132* 137  K 3.1* 3.4*  CL 103 108  CO2 22 22  GLUCOSE 112* 93  BUN 8 <5*  CREATININE 0.72 0.66  CALCIUM 7.2* 7.4*  MG 1.7 1.6*  PHOS  --  3.2   Recent Labs    02/13/19 0319 02/14/19 0320  AST 61* 51*  ALT 24 22  ALKPHOS 43 47  BILITOT 2.6* 3.3*  PROT 5.0* 5.1*  ALBUMIN 2.3* 2.3*   Recent Labs    02/13/19 0319 02/13/19 0319 02/13/19 1828 02/14/19 0320  WBC 5.7  --   --  5.3  NEUTROABS 3.8  --   --  3.5  HGB 6.6*   < > 8.3* 8.0*  HCT 21.5*   < > 26.5* 25.8*  MCV 77.9*  --   --  80.6  PLT 91*  --   --  94*   < > = values in this interval not displayed.   Recent Labs    02/13/19 0319 02/14/19 0320  LABPROT 20.0* 20.8*  INR 1.7* 1.8*      Assessment/Plan: - hematemesis with EGD yesterday showing esophageal varices status post band ligation 02/12/2019.   -Coagulopathy.  INR more than 10 on admission..  No  signs of acute liver failure as transaminases are normal and he is alert/oriented x3.  Improving now.  INR 1.8 today  -Acute blood loss anemia. -Diarrhea. ?  etiology.  Resolved  CT scan showed wall thickening of the colon which could be infectious/inflammatory or from portal hypertension.  GI pathogen panel and C. difficile negative.  Recommendations ------------------------ -Advance diet to heart healthy/ 2 g sodium diet -Vitamin K 10 mg once  -Hopefully discharge home tomorrow if hemoglobin stable.   -Continue octreotide drip for today.  -Continue Protonix to IV twice daily -Continue ciprofloxacin 500 mg twice for total for  5 days -Continue propranolol 10 mg twice daily -No need for inpatient colonoscopy. -Recommend repeat EGD in 3 to 4 weeks for further treatment of esophageal varices. -GI will follow   [name] MD, [place] 02/14/2019, 12:44 PM  Contact #  [phone]

## 2019-02-15 LAB — COMPREHENSIVE METABOLIC PANEL
ALT: 21 U/L (ref 0–44)
AST: 43 U/L — ABNORMAL HIGH (ref 15–41)
Albumin: 2.3 g/dL — ABNORMAL LOW (ref 3.5–5.0)
Alkaline Phosphatase: 45 U/L (ref 38–126)
Anion gap: 8 (ref 5–15)
BUN: 5 mg/dL — ABNORMAL LOW (ref 6–20)
CO2: 20 mmol/L — ABNORMAL LOW (ref 22–32)
Calcium: 7.6 mg/dL — ABNORMAL LOW (ref 8.9–10.3)
Chloride: 108 mmol/L (ref 98–111)
Creatinine, Ser: 0.78 mg/dL (ref 0.61–1.24)
GFR calc Af Amer: >60 mL/min (ref >60)
GFR calc non Af Amer: >60 mL/min (ref >60)
Glucose, Bld: 80 mg/dL (ref 70–99)
Potassium: 4 mmol/L (ref 3.5–5.1)
Sodium: 136 mmol/L (ref 135–145)
Total Bilirubin: 2.9 mg/dL — ABNORMAL HIGH (ref 0.3–1.2)
Total Protein: 5.1 g/dL — ABNORMAL LOW (ref 6.5–8.1)

## 2019-02-15 LAB — CULTURE, BLOOD (ROUTINE X 2)
Culture: NO GROWTH
Special Requests: ADEQUATE

## 2019-02-15 LAB — CBC WITH DIFFERENTIAL/PLATELET
Abs Immature Granulocytes: 0.01 10*3/uL (ref 0.00–0.07)
Basophils Absolute: 0.1 10*3/uL (ref 0.0–0.1)
Basophils Relative: 1 %
Eosinophils Absolute: 0.3 10*3/uL (ref 0.0–0.5)
Eosinophils Relative: 6 %
HCT: 27.7 % — ABNORMAL LOW (ref 39.0–52.0)
Hemoglobin: 8.4 g/dL — ABNORMAL LOW (ref 13.0–17.0)
Immature Granulocytes: 0 %
Lymphocytes Relative: 19 %
Lymphs Abs: 0.9 10*3/uL (ref 0.7–4.0)
MCH: 25.4 pg — ABNORMAL LOW (ref 26.0–34.0)
MCHC: 30.3 g/dL (ref 30.0–36.0)
MCV: 83.7 fL (ref 80.0–100.0)
Monocytes Absolute: 0.6 10*3/uL (ref 0.1–1.0)
Monocytes Relative: 14 %
Neutro Abs: 2.8 10*3/uL (ref 1.7–7.7)
Neutrophils Relative %: 60 %
Platelets: 94 10*3/uL — ABNORMAL LOW (ref 150–400)
RBC: 3.31 MIL/uL — ABNORMAL LOW (ref 4.22–5.81)
RDW: 23.2 % — ABNORMAL HIGH (ref 11.5–15.5)
WBC: 4.5 10*3/uL (ref 4.0–10.5)
nRBC: 0 % (ref 0.0–0.2)

## 2019-02-15 LAB — PROTIME-INR
INR: 1.9 — ABNORMAL HIGH (ref 0.8–1.2)
Prothrombin Time: 21.6 seconds — ABNORMAL HIGH (ref 11.4–15.2)

## 2019-02-15 LAB — MAGNESIUM: Magnesium: 1.5 mg/dL — ABNORMAL LOW (ref 1.7–2.4)

## 2019-02-15 LAB — PHOSPHORUS: Phosphorus: 3.3 mg/dL (ref 2.5–4.6)

## 2019-02-15 MED ORDER — CIPROFLOXACIN HCL 500 MG PO TABS: 500.0000 mg | ORAL_TABLET | Freq: Two times a day (BID) | ORAL | 0 refills | Status: AC

## 2019-02-15 MED ORDER — MAGNESIUM OXIDE 400 (241.3 MG) MG PO TABS: 400.0000 mg | ORAL_TABLET | Freq: Every day | ORAL | 0 refills | Status: AC

## 2019-02-15 MED ORDER — PROPRANOLOL HCL 10 MG PO TABS: 10.0000 mg | ORAL_TABLET | Freq: Two times a day (BID) | ORAL | 0 refills | Status: DC

## 2019-02-15 MED ORDER — PHYTONADIONE 5 MG PO TABS
5.0000 mg | ORAL_TABLET | Freq: Once | ORAL | Status: AC
  Administered 2019-02-15: 5 mg via ORAL
  Filled 2019-02-15: qty 1

## 2019-02-15 MED ORDER — PANTOPRAZOLE SODIUM 40 MG PO TBEC: 40.0000 mg | DELAYED_RELEASE_TABLET | Freq: Every day | ORAL | Status: DC

## 2019-02-15 MED ORDER — PHYTONADIONE 5 MG PO TABS: 5.0000 mg | ORAL_TABLET | Freq: Every day | ORAL | 0 refills | Status: AC

## 2019-02-15 MED ORDER — PANTOPRAZOLE SODIUM 40 MG PO TBEC: 40.0000 mg | DELAYED_RELEASE_TABLET | Freq: Every day | ORAL | 0 refills | Status: DC

## 2019-02-15 MED FILL — PANTOPRAZOLE SOD DR 40 MG T: 40 | 30 days supply | Qty: 30 | Fill #0

## 2019-02-15 MED FILL — CIPROFLOXACIN HCL 500 MG TA: 500 | 3 days supply | Qty: 6 | Fill #0

## 2019-02-15 MED FILL — PROPRANOLOL HCL 10 MG TAB: 10 | 30 days supply | Qty: 60 | Fill #0

## 2019-02-15 NOTE — Progress Notes (Signed)
Provider came and explained discharge instructions, Vitamin K ordered with instructions to call after 5 days PCP or hospital provider for follow up labwork. AVS summary printed and reviewed with patient. Cell phone with patient. IV's discontinued. Allowed time for questions. Simmie Davies RN

## 2019-02-15 NOTE — Progress Notes (Signed)
[name] Kitchen  PROGRESS NOTE    Joshua Hubbard  [id number] DOB: Jun 11, 1975 DOA: 02/10/2019 PCP: Joshua La, MD   Brief Narrative:   44 year old white male known history of chronic EtOH, cirrhosis, TCP, electrolyte derangements, hepatic hemangioma? (Needs MRI of liver when in nonacute setting) admitted with frank hematemesis Previous hospitalization 10/05/2018-10/07/2018 Transfused 4 units PRBC antiemetics, IV PPI Sandostatin and received FFP vitamin K x2 2/2 INR greater than 10 Also significant diarrhea in ED which is since stopped  02/15/20: No acute events ON. His Hgb is stable. No new reports of bleed. Had Vit K yesterday, but INR remains 1.9 (really no change from yesterday). Overall he is looking well. Would say we could switch him to PO protonix BID for the next 6 - 8 weeks w/ GI follow up and the thought that he could possibly leave today. Will get GI's final recs. Cipro through 02/18/19 AM.    Assessment & Plan:   Principal Problem:   Upper GI bleed Active Problems:   Hypokalemia   Coagulopathy (HCC)   Sepsis (HCC)   Colitis  Hematemesis from variceal bleed - Significant bleeding from varices; s/p banding, appreciate GI assistance - IV octreotide, protonix - diet: FLD - repeat EGD in 3 - 4 weeks - propranolol 10mg  BID - cipro 500mg  BID x 5 days (End 02/18/19 AM)  Hypokalemia Hypomagensemia Hyponatremia - K+ resolved - Mg2+ 1.5; continue replacement; will go home with short term supplement - Na+ iresolved  Diarrhea on admission - Unlikely to be infectious seems to have stopped - Could be the cause for hypokalemia - GI PCR/C diff is negative - imodium PRN  Hepatic hemangioma? - Patient will need outpatient MRI to characterize the mass once all of his other issues settle down  Alcoholic cirrhosis - [id number] - Recommend fibrosure-4/elastography in the outpatient testing - INR is 1.9; Vit K on  02/14/19 - If cessation can be accomplished by the patient over 6 months he may be able to get on to a liver list if it is felt that this is necessary as an outpatient by PCP  Acute blood loss anemia Thrombocytopenia Coagulopathy - Hgb 8.4 this AM, s/p 2 units pRBCs 02/13/19; continue to monitor H&H - INR is 1.9 this AM, monitor - thrombocytopenia likely d/t liver disease, monitor - Starting p.o. iron on discharge     - H&H is holding, no new signs of bleed.  Anxiety Insomnia Restlessness     - melatonin, klonopin qHS     - reports improvement in symptoms  DVT prophylaxis: SCDs Code Status: FULL Family Communication: None at bedside   Disposition Plan: Remains on IV octreotide. When stopped and Hgb stable, to home.   Consultants:   GI  Procedures:   EGD  Antimicrobials:  . Cipro (End 02/18/19 AM)   ROS:  Denies ab pain, N, V, CP. Remainder 10-pt ROS is negative for all not previously mentioned.  Subjective: "I hope I get to go today."  Objective: Vitals:   02/14/19 2106 02/14/19 2338 02/15/19 0353 02/15/19 0745  BP: 115/66 107/68 109/65 108/67  Pulse: 79 77 68 75  Resp:  (!) 24 16 11   Temp:  98.3 F (36.8 C) 98.2 F (36.8 C) 98.3 F (36.8 C)  TempSrc:  Oral Oral Oral  SpO2:  96% 94% 97%  Weight:      Height:        Intake/Output Summary (Last 24 hours) at 02/15/2019 0802 Last data filed at 02/15/2019 0355 Gross per  24 hour  Intake 1129.28 ml  Output 1300 ml  Net -170.72 ml   Filed Weights   02/10/19 2059 02/12/19 0923  Weight: 78.9 kg 80 kg    Examination:  General: 44 y.o. male resting on side of bed in NAD Cardiovascular: RRR, +S1, S2, no m/g/r, equal pulses throughout Respiratory: CTABL, no w/r/r, normal WOB GI: BS+, NDNT, no masses noted, no organomegaly noted MSK: No e/c/c Neuro: A&O x 3, no focal deficits Psyc: Appropriate interaction and affect, calm/cooperative   Data Reviewed: I have personally reviewed following  labs and imaging studies.  CBC: Recent Labs  Lab 02/11/19 1321 02/11/19 1321 02/11/19 1958 02/11/19 1958 02/12/19 0545 02/13/19 0319 02/13/19 1828 02/14/19 0320 02/15/19 0549  WBC 6.1   < > 5.4  --  4.6 5.7  --  5.3 4.5  NEUTROABS 3.8  --  3.0  --   --  3.8  --  3.5 2.8  HGB 5.2*   < > 7.0*   < > 7.3* 6.6* 8.3* 8.0* 8.4*  HCT 17.1*   < > 22.0*   < > 23.3* 21.5* 26.5* 25.8* 27.7*  MCV 74.0*   < > 78.0*  --  76.9* 77.9*  --  80.6 83.7  PLT 88*   < > 87*  --  87* 91*  --  94* 94*   < > = values in this interval not displayed.   Basic Metabolic Panel: Recent Labs  Lab 02/10/19 2108 02/11/19 1235 02/12/19 0545 02/12/19 0800 02/13/19 0319 02/14/19 0320 02/15/19 0549  NA 136  --  141  --  132* 137 136  K 3.2*  --  2.9*  --  3.1* 3.4* 4.0  CL 104  --  110  --  103 108 108  CO2 21*  --  24  --  22 22 20*  GLUCOSE 138*  --  92  --  112* 93 80  BUN 20  --  16  --  8 <5* 5*  CREATININE 0.77  --  0.87  --  0.72 0.66 0.78  CALCIUM 7.7*  --  7.5*  --  7.2* 7.4* 7.6*  MG  --  0.9*  --  1.3* 1.7 1.6* 1.5*  PHOS  --  2.3*  --   --   --  3.2 3.3   GFR: Estimated Creatinine Clearance: 117.8 mL/min (by C-G formula based on SCr of 0.78 mg/dL). Liver Function Tests: Recent Labs  Lab 02/10/19 2108 02/12/19 0545 02/13/19 0319 02/14/19 0320 02/15/19 0549  AST 34 61* 61* 51* 43*  ALT 18 21 24 22 21   ALKPHOS 47 47 43 47 45  BILITOT 1.9* 2.9* 2.6* 3.3* 2.9*  PROT 6.0* 5.3* 5.0* 5.1* 5.1*  ALBUMIN 2.6* 2.4* 2.3* 2.3* 2.3*   Recent Labs  Lab 02/10/19 2108  LIPASE 42   No results for input(s): AMMONIA in the last 168 hours. Coagulation Profile: Recent Labs  Lab 02/11/19 1235 02/12/19 0545 02/13/19 0319 02/14/19 0320 02/15/19 0549  INR 3.1* 1.6* 1.7* 1.8* 1.9*   Cardiac Enzymes: No results for input(s): CKTOTAL, CKMB, CKMBINDEX, TROPONINI in the last 168 hours. BNP (last 3 results) No results for input(s): PROBNP in the last 8760 hours. HbA1C: No results for input(s):  HGBA1C in the last 72 hours. CBG: No results for input(s): GLUCAP in the last 168 hours. Lipid Profile: No results for input(s): CHOL, HDL, LDLCALC, TRIG, CHOLHDL, LDLDIRECT in the last 72 hours. Thyroid Function Tests: No results for input(s): TSH,  T4TOTAL, FREET4, T3FREE, THYROIDAB in the last 72 hours. Anemia Panel: No results for input(s): VITAMINB12, FOLATE, FERRITIN, TIBC, IRON, RETICCTPCT in the last 72 hours. Sepsis Labs: Recent Labs  Lab 02/10/19 2120  LATICACIDVEN 3.8*    Recent Results (from the past 240 hour(s))  Blood Culture (routine x 2)     Status: None (Preliminary result)   Collection Time: 02/10/19  9:20 PM   Specimen: BLOOD  Result Value Ref Range Status   Specimen Description BLOOD RIGHT ANTECUBITAL  Final   Special Requests   Final    BOTTLES DRAWN AEROBIC AND ANAEROBIC Blood Culture adequate volume   Culture   Final    NO GROWTH 4 DAYS Performed at [facility], [address], [place], [place] [address]    Report Status PENDING  Incomplete  Culture, blood (Routine X 2) w Reflex to ID Panel     Status: None (Preliminary result)   Collection Time: 02/10/19 10:00 PM   Specimen: BLOOD  Result Value Ref Range Status   Specimen Description BLOOD LEFT ANTECUBITAL  Final   Special Requests   Final    BOTTLES DRAWN AEROBIC AND ANAEROBIC Blood Culture adequate volume   Culture   Final    NO GROWTH 3 DAYS Performed at [facility], [place] [address] [place], [place] [address]    Report Status PENDING  Incomplete  C difficile quick scan w PCR reflex     Status: None   Collection Time: 02/10/19 11:22 PM   Specimen: STOOL  Result Value Ref Range Status   C Diff antigen NEGATIVE NEGATIVE Final   C Diff toxin NEGATIVE NEGATIVE Final   C Diff interpretation No C. difficile detected.  Final    Comment: Performed at [facility], [place] [address], [place], [place] [address]  Urine culture     Status: None   Collection Time: 02/10/19 11:26 PM    Specimen: In/Out Cath Urine  Result Value Ref Range Status   Specimen Description IN/OUT CATH URINE  Final   Special Requests NONE  Final   Culture   Final    NO GROWTH Performed at [facility], [place] [address], [place], [place] [address]    Report Status 02/12/2019 FINAL  Final  GI pathogen panel by PCR, stool     Status: None   Collection Time: 02/10/19 11:29 PM   Specimen: Stool  Result Value Ref Range Status   Plesiomonas shigelloides NOT DETECTED NOT DETECTED Final   Yersinia enterocolitica NOT DETECTED NOT DETECTED Final   Vibrio NOT DETECTED NOT DETECTED Final   Enteropathogenic E coli NOT DETECTED NOT DETECTED Final   E coli (ETEC) LT/ST NOT DETECTED NOT DETECTED Final   E coli [id number] by PCR Not applicable NOT DETECTED Final   Cryptosporidium by PCR NOT DETECTED NOT DETECTED Final   Entamoeba histolytica NOT DETECTED NOT DETECTED Final   Adenovirus F 40/41 NOT DETECTED NOT DETECTED Final   Norovirus GI/GII NOT DETECTED NOT DETECTED Final   Sapovirus NOT DETECTED NOT DETECTED Final    Comment: (NOTE) Performed At: [facility] [place], [place] [id number] Rush Farmer MD [id number]    Vibrio cholerae NOT DETECTED NOT DETECTED Final   Campylobacter by PCR NOT DETECTED NOT DETECTED Final   Salmonella by PCR NOT DETECTED NOT DETECTED Final   E coli (STEC) NOT DETECTED NOT DETECTED Final   Enteroaggregative E coli NOT DETECTED NOT DETECTED Final   Shigella  by PCR NOT DETECTED NOT DETECTED Final   Cyclospora cayetanensis NOT DETECTED NOT DETECTED Final   Astrovirus NOT DETECTED NOT DETECTED Final   G lamblia by PCR NOT DETECTED NOT DETECTED Final   Rotavirus A by PCR NOT DETECTED NOT DETECTED Final  Respiratory Panel by RT PCR (Flu A&B, Covid) - Nasopharyngeal Swab     Status: None   Collection Time: 02/10/19 11:54 PM   Specimen: Nasopharyngeal Swab  Result Value Ref Range Status   SARS Coronavirus 2 by RT PCR NEGATIVE NEGATIVE Final     Comment: (NOTE) SARS-CoV-2 target nucleic acids are NOT DETECTED. The SARS-CoV-2 RNA is generally detectable in upper respiratoy specimens during the acute phase of infection. The lowest concentration of SARS-CoV-2 viral copies this assay can detect is 131 copies/mL. A negative result does not preclude SARS-Cov-2 infection and should not be used as the sole basis for treatment or other patient management decisions. A negative result may occur with  improper specimen collection/handling, submission of specimen other than nasopharyngeal swab, presence of viral mutation(s) within the areas targeted by this assay, and inadequate number of viral copies (<131 copies/mL). A negative result must be combined with clinical observations, patient history, and epidemiological information. The expected result is Negative. Fact Sheet for Patients:  PinkCheek.be Fact Sheet for Healthcare Providers:  GravelBags.it This test is not yet ap proved or cleared by the [place] FDA and  has been authorized for detection and/or diagnosis of SARS-CoV-2 by FDA under an Emergency Use Authorization (EUA). This EUA will remain  in effect (meaning this test can be used) for the duration of the COVID-19 declaration under Section 564(b)(1) of the Act, 21 U.S.C. section 360bbb-3(b)(1), unless the authorization is terminated or revoked sooner.    Influenza A by PCR NEGATIVE NEGATIVE Final   Influenza B by PCR NEGATIVE NEGATIVE Final    Comment: (NOTE) The Xpert Xpress SARS-CoV-2/FLU/RSV assay is intended as an aid in  the diagnosis of influenza from Nasopharyngeal swab specimens and  should not be used as a sole basis for treatment. Nasal washings and  aspirates are unacceptable for Xpert Xpress SARS-CoV-2/FLU/RSV  testing. Fact Sheet for Patients: PinkCheek.be Fact Sheet for Healthcare Providers:  GravelBags.it This test is not yet approved or cleared by the [place] FDA and  has been authorized for detection and/or diagnosis of SARS-CoV-2 by  FDA under an Emergency Use Authorization (EUA). This EUA will remain  in effect (meaning this test can be used) for the duration of the  Covid-19 declaration under Section 564(b)(1) of the Act, 21  U.S.C. section 360bbb-3(b)(1), unless the authorization is  terminated or revoked. Performed at [facility], [place] [address] [place], [place] [address]       Radiology Studies: No results found.   Scheduled Meds: . sodium chloride   Intravenous Once  . ciprofloxacin  500 mg Oral BID  . clonazePAM  0.5 mg Oral QHS  . folic acid  1 mg Oral Daily  . magnesium oxide  400 mg Oral Daily  . Melatonin  6 mg Oral QHS  . multivitamin with minerals  1 tablet Oral Daily  . pantoprazole (PROTONIX) IV  40 mg Intravenous Q12H  . potassium chloride  40 mEq Oral BID  . propranolol  10 mg Oral BID  . thiamine  100 mg Oral Daily   Continuous Infusions: . sodium chloride Stopped (02/11/19 2345)  . octreotide  (SANDOSTATIN)    IV infusion 50 mcg/hr (02/14/19 2112)  LOS: 4 days    Time spent: 25 minutes spent in the coordination of care today.    Jonnie Finner, DO [facility]  If 7PM-7AM, please contact night-coverage [email or web address] 02/15/2019, 8:02 AM

## 2019-02-15 NOTE — Progress Notes (Signed)
[facility] Progress Note  Joshua Hubbard [facility] 44 y.o. 1975-09-03  CC: GI bleed   Subjective: Patient seen and examined at bedside.  No acute issues overnight.  Sitting comfortably in the chair.  Denies any further bleeding episodes   ROS : Afebrile, negative for chest pain and shortness of breath   Objective: Vital signs in last 24 hours: Vitals:   02/15/19 0745 02/15/19 1150  BP: 108/67 116/74  Pulse: 75 71  Resp: 11 (!) 21  Temp: 98.3 F (36.8 C) 98.4 F (36.9 C)  SpO2: 97% 99%    Physical Exam:  General:  Alert, cooperative, no distress, appears stated age  Head:  Normocephalic, without obvious abnormality, atraumatic  Eyes:  , EOM's intact, mild scleral icterus noted  Lungs:   Clear to auscultation bilaterally, respirations unlabored  Heart:  Regular rate and rhythm, S1, S2 normal  Abdomen:   Soft, non-tender, mild distended, bowel sounds present, no peritoneal signs  Extremities: Extremities normal, atraumatic, no  edema       Lab Results: Recent Labs    02/14/19 0320 02/15/19 0549  NA 137 136  K 3.4* 4.0  CL 108 108  CO2 22 20*  GLUCOSE 93 80  BUN <5* 5*  CREATININE 0.66 0.78  CALCIUM 7.4* 7.6*  MG 1.6* 1.5*  PHOS 3.2 3.3   Recent Labs    02/14/19 0320 02/15/19 0549  AST 51* 43*  ALT 22 21  ALKPHOS 47 45  BILITOT 3.3* 2.9*  PROT 5.1* 5.1*  ALBUMIN 2.3* 2.3*   Recent Labs    02/14/19 0320 02/15/19 0549  WBC 5.3 4.5  NEUTROABS 3.5 2.8  HGB 8.0* 8.4*  HCT 25.8* 27.7*  MCV 80.6 83.7  PLT 94* 94*   Recent Labs    02/14/19 0320 02/15/19 0549  LABPROT 20.8* 21.6*  INR 1.8* 1.9*      Assessment/Plan: - hematemesis with EGD yesterday showing esophageal varices status post band ligation 02/12/2019.   -Coagulopathy.  INR more than 10 on admission..  No signs of acute liver failure as transaminases are normal and he is alert/oriented x3.  Improving now.  INR 1.8 today  -Acute blood loss anemia. -Diarrhea. ?  etiology.   Resolved  CT scan showed wall thickening of the colon which could be infectious/inflammatory or from portal hypertension.  GI pathogen panel and C. difficile negative.  Recommendations ------------------------ -Patient's hemoglobin is stable.  No further bleeding episodes.  INR around 1.9. -Okay to discharge from GI standpoint.  -Recommend vitamin K 5 mg p.o. once a day for 5 days.  Change Protonix to 40 mg once a day on discharge. -Continue ciprofloxacin 500 mg twice for total for  5 days -Continue propranolol 10 mg twice daily -Recommend repeat blood work next week to check for CBC and INR.  -Follow-up with Dr. [name] in 2 weeks after discharge. -Recommend repeat EGD in 3 to 4 weeks for further treatment of esophageal varices.  Recommend outpatient colonoscopy.  GI will sign off.  Call us back if needed   [name] MD, [place] 02/15/2019, 12:27 PM  Contact #  [phone]

## 2019-02-15 NOTE — Discharge Summary (Addendum)
. Physician Discharge Summary  Joshua Hubbard [id number] DOB: September 12, 1975 DOA: 02/10/2019  PCP: [name], MD  Admit date: 02/10/2019 Discharge date: 02/15/2019  Admitted From: Home Disposition:  Discharged to home.   Recommendations for Outpatient Follow-up:  1. Follow up with PCP in 1 week. 2. Please obtain CBC/INR in one week. 3. Follow up with Dr. [name] in 2 weeks.   Discharge Condition: Stable  CODE STATUS: FULL   Brief/Interim Summary: 44 year old white male known history of chronic EtOH, cirrhosis, TCP, electrolyte derangements, hepatic hemangioma? (Needs MRI of liver when in nonacute setting) admitted with frank hematemesis Previous hospitalization 10/05/2018-10/07/2018 Transfused 4 units PRBC antiemetics, IV PPI Sandostatin and received FFP vitamin K x2 2/2 INR greater than 10 Also significant diarrhea in ED which is since stopped  02/15/20: No acute events ON. His Hgb is stable. No new reports of bleed. Had Vit K yesterday, but INR remains 1.9 (really no change from yesterday). Will send home with Vit K 5mg  PO x 5 days. Will need PCP follow up for labs. See GI as scheduled. He is stable for discharge.   Discharge Diagnoses:  Principal Problem:   Upper GI bleed Active Problems:   Hypokalemia   Coagulopathy (HCC)   Sepsis (HCC)   Colitis  Hematemesis from variceal bleed - Significant bleeding from varices; s/p banding, appreciate GI assistance - IV octreotide, protonix - diet: FLD - repeat EGD in 3 - 4 weeks - propranolol10mg  BID -cipro 500mg  BID x 5 days (End 02/18/19 AM)     - Final GI recs:          - Recommend vitamin K 5 mg p.o. once a day for 5 days.         - Change Protonix to 40 mg once a day on discharge.         - Continue ciprofloxacin 500 mg twice for total for  5 days         - Continue propranolol 10 mg twice daily         - Recommend repeat blood work next week to check for CBC and INR.         - Follow-up with Dr.  [name] in 2 weeks after discharge.         - Recommend repeat EGD in 3 to 4 weeks for further treatment of esophageal varices.  Recommend outpatient colonoscopy.  Hypokalemia Hypomagensemia Hyponatremia - K+ resolved - Mg2+ 1.5;continue replacement; will go home with short term supplement -Na+ iresolved  Diarrhea on admission - Unlikely to be infectious seems to have stopped - Could be the cause for hypokalemia - GI PCR/C diff is negative - imodium PRN  Hepatic hemangioma? - Patient will need outpatient MRI to characterize the mass once all of his other issues settle down  Alcoholic cirrhosis - [id number] - Recommend fibrosure-4/elastography in the outpatient testing - INR is 1.9; Vit K on 02/14/19 - If cessation can be accomplished by the patient over 6 months he may be able to get on to a liver list if it is felt that this is necessary as an outpatient by PCP  Acute blood loss anemia Thrombocytopenia Coagulopathy - Hgb8.4this AM, s/p2 units pRBCs1/20/21; continue to monitor H&H - INR is 1.9this AM, monitor - thrombocytopenia likely d/t liver disease, monitor - Starting p.o. iron on discharge     - H&H is holding, no new signs of bleed.  Anxiety Insomnia Restlessness - melatonin, klonopin qHS     -  reports improvement in symptoms  Discharge Instructions   Allergies as of 02/15/2019   No Known Allergies     Medication List    STOP taking these medications   magnesium chloride 64 MG Tbec SR tablet Commonly known as: SLOW-MAG     TAKE these medications   ciprofloxacin 500 MG tablet Commonly known as: CIPRO Take 1 tablet (500 mg total) by mouth 2 (two) times daily for 6 doses.   clonazePAM 1 MG tablet Commonly known as: KLONOPIN TAKE 1 TABLET AT BEDTIME   magnesium oxide 400 (241.3 Mg) MG tablet Commonly known as: MAG-OX Take 1 tablet (400 mg total) by mouth daily for 5  days. Start taking on: February 16, 2019   multivitamin with minerals Tabs tablet Take 1 tablet by mouth daily.   pantoprazole 40 MG tablet Commonly known as: PROTONIX Take 1 tablet (40 mg total) by mouth daily. What changed: when to take this   phytonadione 5 MG tablet Commonly known as: VITAMIN K Take 1 tablet (5 mg total) by mouth daily for 5 days.   propranolol 10 MG tablet Commonly known as: INDERAL Take 1 tablet (10 mg total) by mouth 2 (two) times daily.       No Known Allergies  Consultations:  GI   Procedures/Studies: CT Head Wo Contrast  Result Date: 02/10/2019 CLINICAL DATA:  Syncope. EXAM: CT HEAD WITHOUT CONTRAST TECHNIQUE: Contiguous axial images were obtained from the base of the skull through the vertex without intravenous contrast. COMPARISON:  None. FINDINGS: Brain: No evidence of acute infarction, hemorrhage, hydrocephalus, extra-axial collection or mass lesion/mass effect. There is mild atrophy, greater than expected for the patient's age. Vascular: No hyperdense vessel or unexpected calcification. Skull: There may be some mild left frontal scalp swelling. No evidence for an underlying calvarial fracture. Sinuses/Orbits: Small mucosal retention cysts are noted within the bilateral maxillary sinuses. There is mucosal thickening of the ethmoid air cells. The remaining paranasal sinuses and mastoid air cells are essentially clear. Other: None. IMPRESSION: 1. No acute intracranial abnormality. 2. Atrophy, greater than expected for the patient's age. Electronically Signed   By: Constance Holster M.D.   On: 02/10/2019 21:43   CT ABDOMEN PELVIS W CONTRAST  Result Date: 02/11/2019 CLINICAL DATA:  Fever all.  Diarrhea.  Active GI bleeding. EXAM: CT ABDOMEN AND PELVIS WITH CONTRAST TECHNIQUE: Multidetector CT imaging of the abdomen and pelvis was performed using the standard protocol following bolus administration of intravenous contrast. CONTRAST:  157mL OMNIPAQUE  IOHEXOL 300 MG/ML  SOLN COMPARISON:  10/04/2018 FINDINGS: Lower chest: The lung bases are clear. The heart size is normal. There is a probable pericardial cyst, stable from prior study. Hepatobiliary: Again noted is severe hepatic steatosis with findings of cirrhosis. There is a stable cystic appearing lesion involving the right hepatic lobe. There is a stable cystic lesion involving the anterior left hepatic lobe (axial series 3, image 36). Pancreas: Peripancreatic fat stranding is noted. Spleen: The spleen is significantly enlarged. Adrenals/Urinary Tract: --Adrenal glands: No adrenal hemorrhage. --Right kidney/ureter: No hydronephrosis or perinephric hematoma. --Left kidney/ureter: No hydronephrosis or perinephric hematoma. --Urinary bladder: Unremarkable. Stomach/Bowel: --Stomach/Duodenum: There is a small hiatal hernia. --Small bowel: No dilatation or inflammation. --Colon: There may be some mild wall thickening of the splenic flexure of the colon. There may be some mild wall thickening at the hepatic flexure of the colon as well as the cecum and ascending colon. --Appendix: Normal. Vascular/Lymphatic: Atherosclerotic calcification is present within the non-aneurysmal abdominal aorta, without hemodynamically  significant stenosis. The portal vein remains patent. Esophageal varices are suspected. Portosystemic shunting is noted consistent with portal hypertension. --there is shotty retroperitoneal adenopathy. --mild mesenteric adenopathy is noted --No pelvic or inguinal lymphadenopathy. Reproductive: Unremarkable Other: The patient is status post prior ventral hernia repair. There is a small volume of free fluid in the patient's abdomen. There is no free air. Mesenteric edema is noted. Musculoskeletal. There is a bilateral pars defect at L5 with minimal anterolisthesis of L5 on S1. IMPRESSION: 1. Cirrhosis with stigmata of portal hypertension as detailed above. 2. Peripancreatic free fluid in fat stranding.  Correlation with lipase is recommended to help exclude underlying pancreatitis. 3. Wall thickening of significant portions of the colon. Findings may be secondary to a diffuse infectious or inflammatory colitis. Portal hypertensive colopathy is also within the differential. 4. Scattered retroperitoneal and mesenteric adenopathy, presumably reactive. Electronically Signed   By: Constance Holster M.D.   On: 02/11/2019 00:17   DG Chest Port 1 View  Result Date: 02/10/2019 CLINICAL DATA:  Sepsis EXAM: PORTABLE CHEST 1 VIEW COMPARISON:  Radiograph 07/31/2018 FINDINGS: Lung volumes are low with some streaky atelectasis. No consolidation, features of edema, pneumothorax, or effusion. The cardiomediastinal contours are unremarkable. No acute osseous or soft tissue abnormality. IMPRESSION: Low lung volumes. No focal consolidative opacity or other acute cardiopulmonary abnormality. Electronically Signed   By: Lovena Le M.D.   On: 02/10/2019 21:29      Subjective: "I hope I get to go today."  Discharge Exam: Vitals:   02/15/19 0745 02/15/19 1150  BP: 108/67 116/74  Pulse: 75 71  Resp: 11 (!) 21  Temp: 98.3 F (36.8 C) 98.4 F (36.9 C)  SpO2: 97% 99%   Vitals:   02/14/19 2338 02/15/19 0353 02/15/19 0745 02/15/19 1150  BP: 107/68 109/65 108/67 116/74  Pulse: 77 68 75 71  Resp: (!) 24 16 11  (!) 21  Temp: 98.3 F (36.8 C) 98.2 F (36.8 C) 98.3 F (36.8 C) 98.4 F (36.9 C)  TempSrc: Oral Oral Oral Oral  SpO2: 96% 94% 97% 99%  Weight:      Height:        General: 44 y.o. male resting in bed in NAD Cardiovascular: RRR, +S1, S2, no m/g/r Respiratory: CTABL, no w/r/r, normal WOB GI: BS+, NDNT, soft MSK: No e/c/c Neuro: A&O x 3, no focal deficits Psyc: Appropriate interaction and affect, calm/cooperative   The results of significant diagnostics from this hospitalization (including imaging, microbiology, ancillary and laboratory) are listed below for reference.      Microbiology: Recent Results (from the past 240 hour(s))  Blood Culture (routine x 2)     Status: None   Collection Time: 02/10/19  9:20 PM   Specimen: BLOOD  Result Value Ref Range Status   Specimen Description BLOOD RIGHT ANTECUBITAL  Final   Special Requests   Final    BOTTLES DRAWN AEROBIC AND ANAEROBIC Blood Culture adequate volume   Culture   Final    NO GROWTH 5 DAYS Performed at [facility], [address], [place], [place] [address]    Report Status 02/15/2019 FINAL  Final  Culture, blood (Routine X 2) w Reflex to ID Panel     Status: None (Preliminary result)   Collection Time: 02/10/19 10:00 PM   Specimen: BLOOD  Result Value Ref Range Status   Specimen Description BLOOD LEFT ANTECUBITAL  Final   Special Requests   Final    BOTTLES DRAWN AEROBIC AND ANAEROBIC Blood Culture  adequate volume   Culture   Final    NO GROWTH 4 DAYS Performed at [facility], [place] [address], [place], [place] [address]    Report Status PENDING  Incomplete  C difficile quick scan w PCR reflex     Status: None   Collection Time: 02/10/19 11:22 PM   Specimen: STOOL  Result Value Ref Range Status   C Diff antigen NEGATIVE NEGATIVE Final   C Diff toxin NEGATIVE NEGATIVE Final   C Diff interpretation No C. difficile detected.  Final    Comment: Performed at [facility], [place] [address] [place], [place] [address]  Urine culture     Status: None   Collection Time: 02/10/19 11:26 PM   Specimen: In/Out Cath Urine  Result Value Ref Range Status   Specimen Description IN/OUT CATH URINE  Final   Special Requests NONE  Final   Culture   Final    NO GROWTH Performed at [facility], [place] [address] [place],  [address]    Report Status 02/12/2019 FINAL  Final  GI pathogen panel by PCR, stool     Status: None   Collection Time: 02/10/19 11:29 PM   Specimen: Stool  Result Value Ref Range Status   Plesiomonas shigelloides NOT DETECTED NOT DETECTED Final    Yersinia enterocolitica NOT DETECTED NOT DETECTED Final   Vibrio NOT DETECTED NOT DETECTED Final   Enteropathogenic E coli NOT DETECTED NOT DETECTED Final   E coli (ETEC) LT/ST NOT DETECTED NOT DETECTED Final   E coli [id number] by PCR Not applicable NOT DETECTED Final   Cryptosporidium by PCR NOT DETECTED NOT DETECTED Final   Entamoeba histolytica NOT DETECTED NOT DETECTED Final   Adenovirus F 40/41 NOT DETECTED NOT DETECTED Final   Norovirus GI/GII NOT DETECTED NOT DETECTED Final   Sapovirus NOT DETECTED NOT DETECTED Final    Comment: (NOTE) Performed At: [facility] [place], [place] [id number] Rush Farmer MD [id number]    Vibrio cholerae NOT DETECTED NOT DETECTED Final   Campylobacter by PCR NOT DETECTED NOT DETECTED Final   Salmonella by PCR NOT DETECTED NOT DETECTED Final   E coli (STEC) NOT DETECTED NOT DETECTED Final   Enteroaggregative E coli NOT DETECTED NOT DETECTED Final   Shigella by PCR NOT DETECTED NOT DETECTED Final   Cyclospora cayetanensis NOT DETECTED NOT DETECTED Final   Astrovirus NOT DETECTED NOT DETECTED Final   G lamblia by PCR NOT DETECTED NOT DETECTED Final   Rotavirus A by PCR NOT DETECTED NOT DETECTED Final  Respiratory Panel by RT PCR (Flu A&B, Covid) - Nasopharyngeal Swab     Status: None   Collection Time: 02/10/19 11:54 PM   Specimen: Nasopharyngeal Swab  Result Value Ref Range Status   SARS Coronavirus 2 by RT PCR NEGATIVE NEGATIVE Final    Comment: (NOTE) SARS-CoV-2 target nucleic acids are NOT DETECTED. The SARS-CoV-2 RNA is generally detectable in upper respiratoy specimens during the acute phase of infection. The lowest concentration of SARS-CoV-2 viral copies this assay can detect is 131 copies/mL. A negative result does not preclude SARS-Cov-2 infection and should not be used as the sole basis for treatment or other patient management decisions. A negative result may occur with  improper specimen collection/handling,  submission of specimen other than nasopharyngeal swab, presence of viral mutation(s) within the areas targeted by this assay, and inadequate number of viral copies (<131 copies/mL). A negative result must be combined with  clinical observations, patient history, and epidemiological information. The expected result is Negative. Fact Sheet for Patients:  PinkCheek.be Fact Sheet for Healthcare Providers:  GravelBags.it This test is not yet ap proved or cleared by the [place] FDA and  has been authorized for detection and/or diagnosis of SARS-CoV-2 by FDA under an Emergency Use Authorization (EUA). This EUA will remain  in effect (meaning this test can be used) for the duration of the COVID-19 declaration under Section 564(b)(1) of the Act, 21 U.S.C. section 360bbb-3(b)(1), unless the authorization is terminated or revoked sooner.    Influenza A by PCR NEGATIVE NEGATIVE Final   Influenza B by PCR NEGATIVE NEGATIVE Final    Comment: (NOTE) The Xpert Xpress SARS-CoV-2/FLU/RSV assay is intended as an aid in  the diagnosis of influenza from Nasopharyngeal swab specimens and  should not be used as a sole basis for treatment. Nasal washings and  aspirates are unacceptable for Xpert Xpress SARS-CoV-2/FLU/RSV  testing. Fact Sheet for Patients: PinkCheek.be Fact Sheet for Healthcare Providers: GravelBags.it This test is not yet approved or cleared by the [place] FDA and  has been authorized for detection and/or diagnosis of SARS-CoV-2 by  FDA under an Emergency Use Authorization (EUA). This EUA will remain  in effect (meaning this test can be used) for the duration of the  Covid-19 declaration under Section 564(b)(1) of the Act, 21  U.S.C. section 360bbb-3(b)(1), unless the authorization is  terminated or revoked. Performed at [facility], [place] [address],  [place], [place] [address]      Labs: BNP (last 3 results) No results for input(s): BNP in the last 8760 hours. Basic Metabolic Panel: Recent Labs  Lab 02/10/19 2108 02/11/19 1235 02/12/19 0545 02/12/19 0800 02/13/19 0319 02/14/19 0320 02/15/19 0549  NA 136  --  141  --  132* 137 136  K 3.2*  --  2.9*  --  3.1* 3.4* 4.0  CL 104  --  110  --  103 108 108  CO2 21*  --  24  --  22 22 20*  GLUCOSE 138*  --  92  --  112* 93 80  BUN 20  --  16  --  8 <5* 5*  CREATININE 0.77  --  0.87  --  0.72 0.66 0.78  CALCIUM 7.7*  --  7.5*  --  7.2* 7.4* 7.6*  MG  --  0.9*  --  1.3* 1.7 1.6* 1.5*  PHOS  --  2.3*  --   --   --  3.2 3.3   Liver Function Tests: Recent Labs  Lab 02/10/19 2108 02/12/19 0545 02/13/19 0319 02/14/19 0320 02/15/19 0549  AST 34 61* 61* 51* 43*  ALT 18 21 24 22 21   ALKPHOS 47 47 43 47 45  BILITOT 1.9* 2.9* 2.6* 3.3* 2.9*  PROT 6.0* 5.3* 5.0* 5.1* 5.1*  ALBUMIN 2.6* 2.4* 2.3* 2.3* 2.3*   Recent Labs  Lab 02/10/19 2108  LIPASE 42   No results for input(s): AMMONIA in the last 168 hours. CBC: Recent Labs  Lab 02/11/19 1321 02/11/19 1321 02/11/19 1958 02/11/19 1958 02/12/19 0545 02/13/19 0319 02/13/19 1828 02/14/19 0320 02/15/19 0549  WBC 6.1   < > 5.4  --  4.6 5.7  --  5.3 4.5  NEUTROABS 3.8  --  3.0  --   --  3.8  --  3.5 2.8  HGB 5.2*   < > 7.0*   < > 7.3* 6.6* 8.3* 8.0* 8.4*  HCT 17.1*   < > 22.0*   < > 23.3* 21.5* 26.5* 25.8* 27.7*  MCV 74.0*   < > 78.0*  --  76.9* 77.9*  --  80.6 83.7  PLT 88*   < > 87*  --  87* 91*  --  94* 94*   < > = values in this interval not displayed.   Cardiac Enzymes: No results for input(s): CKTOTAL, CKMB, CKMBINDEX, TROPONINI in the last 168 hours. BNP: Invalid input(s): POCBNP CBG: No results for input(s): GLUCAP in the last 168 hours. D-Dimer No results for input(s): DDIMER in the last 72 hours. Hgb A1c No results for input(s): HGBA1C in the last 72 hours. Lipid Profile No results for input(s): CHOL, HDL,  LDLCALC, TRIG, CHOLHDL, LDLDIRECT in the last 72 hours. Thyroid function studies No results for input(s): TSH, T4TOTAL, T3FREE, THYROIDAB in the last 72 hours.  Invalid input(s): FREET3 Anemia work up No results for input(s): VITAMINB12, FOLATE, FERRITIN, TIBC, IRON, RETICCTPCT in the last 72 hours. Urinalysis    Component Value Date/Time   COLORURINE YELLOW 02/10/2019 2322   APPEARANCEUR CLEAR 02/10/2019 2322   LABSPEC 1.023 02/10/2019 2322   PHURINE 6.0 02/10/2019 2322   GLUCOSEU NEGATIVE 02/10/2019 2322   HGBUR NEGATIVE 02/10/2019 2322   BILIRUBINUR NEGATIVE 02/10/2019 2322   KETONESUR 5 (A) 02/10/2019 2322   PROTEINUR NEGATIVE 02/10/2019 2322   NITRITE NEGATIVE 02/10/2019 2322   LEUKOCYTESUR NEGATIVE 02/10/2019 2322   Sepsis Labs Invalid input(s): PROCALCITONIN,  WBC,  LACTICIDVEN Microbiology Recent Results (from the past 240 hour(s))  Blood Culture (routine x 2)     Status: None   Collection Time: 02/10/19  9:20 PM   Specimen: BLOOD  Result Value Ref Range Status   Specimen Description BLOOD RIGHT ANTECUBITAL  Final   Special Requests   Final    BOTTLES DRAWN AEROBIC AND ANAEROBIC Blood Culture adequate volume   Culture   Final    NO GROWTH 5 DAYS Performed at [facility], [address], [place], [place] [address]    Report Status 02/15/2019 FINAL  Final  Culture, blood (Routine X 2) w Reflex to ID Panel     Status: None (Preliminary result)   Collection Time: 02/10/19 10:00 PM   Specimen: BLOOD  Result Value Ref Range Status   Specimen Description BLOOD LEFT ANTECUBITAL  Final   Special Requests   Final    BOTTLES DRAWN AEROBIC AND ANAEROBIC Blood Culture adequate volume   Culture   Final    NO GROWTH 4 DAYS Performed at [facility], [place] [address], [place], [place] [address]    Report Status PENDING  Incomplete  C difficile quick scan w PCR reflex     Status: None   Collection Time: 02/10/19 11:22 PM   Specimen: STOOL  Result Value Ref Range  Status   C Diff antigen NEGATIVE NEGATIVE Final   C Diff toxin NEGATIVE NEGATIVE Final   C Diff interpretation No C. difficile detected.  Final    Comment: Performed at [facility], [place] [address] [place], [place] [address]  Urine culture     Status: None   Collection Time: 02/10/19 11:26 PM   Specimen: In/Out Cath Urine  Result Value Ref Range Status   Specimen Description IN/OUT CATH URINE  Final   Special Requests NONE  Final   Culture   Final    NO GROWTH Performed at [facility], [place],  [place] [address]    Report Status 02/12/2019 FINAL  Final  GI pathogen panel by PCR, stool     Status: None   Collection Time: 02/10/19 11:29 PM   Specimen: Stool  Result Value Ref Range Status   Plesiomonas shigelloides NOT DETECTED NOT DETECTED Final   Yersinia enterocolitica NOT DETECTED NOT DETECTED Final   Vibrio NOT DETECTED NOT DETECTED Final   Enteropathogenic E coli NOT DETECTED NOT DETECTED Final   E coli (ETEC) LT/ST NOT DETECTED NOT DETECTED Final   E coli [id number] by PCR Not applicable NOT DETECTED Final   Cryptosporidium by PCR NOT DETECTED NOT DETECTED Final   Entamoeba histolytica NOT DETECTED NOT DETECTED Final   Adenovirus F 40/41 NOT DETECTED NOT DETECTED Final   Norovirus GI/GII NOT DETECTED NOT DETECTED Final   Sapovirus NOT DETECTED NOT DETECTED Final    Comment: (NOTE) Performed At: [facility] [place], [place] [id number] Rush Farmer MD [id number]    Vibrio cholerae NOT DETECTED NOT DETECTED Final   Campylobacter by PCR NOT DETECTED NOT DETECTED Final   Salmonella by PCR NOT DETECTED NOT DETECTED Final   E coli (STEC) NOT DETECTED NOT DETECTED Final   Enteroaggregative E coli NOT DETECTED NOT DETECTED Final   Shigella by PCR NOT DETECTED NOT DETECTED Final   Cyclospora cayetanensis NOT DETECTED NOT DETECTED Final   Astrovirus NOT DETECTED NOT DETECTED Final   G lamblia by PCR NOT DETECTED NOT DETECTED  Final   Rotavirus A by PCR NOT DETECTED NOT DETECTED Final  Respiratory Panel by RT PCR (Flu A&B, Covid) - Nasopharyngeal Swab     Status: None   Collection Time: 02/10/19 11:54 PM   Specimen: Nasopharyngeal Swab  Result Value Ref Range Status   SARS Coronavirus 2 by RT PCR NEGATIVE NEGATIVE Final    Comment: (NOTE) SARS-CoV-2 target nucleic acids are NOT DETECTED. The SARS-CoV-2 RNA is generally detectable in upper respiratoy specimens during the acute phase of infection. The lowest concentration of SARS-CoV-2 viral copies this assay can detect is 131 copies/mL. A negative result does not preclude SARS-Cov-2 infection and should not be used as the sole basis for treatment or other patient management decisions. A negative result may occur with  improper specimen collection/handling, submission of specimen other than nasopharyngeal swab, presence of viral mutation(s) within the areas targeted by this assay, and inadequate number of viral copies (<131 copies/mL). A negative result must be combined with clinical observations, patient history, and epidemiological information. The expected result is Negative. Fact Sheet for Patients:  PinkCheek.be Fact Sheet for Healthcare Providers:  GravelBags.it This test is not yet ap proved or cleared by the [place] FDA and  has been authorized for detection and/or diagnosis of SARS-CoV-2 by FDA under an Emergency Use Authorization (EUA). This EUA will remain  in effect (meaning this test can be used) for the duration of the COVID-19 declaration under Section 564(b)(1) of the Act, 21 U.S.C. section 360bbb-3(b)(1), unless the authorization is terminated or revoked sooner.    Influenza A by PCR NEGATIVE NEGATIVE Final   Influenza B by PCR NEGATIVE NEGATIVE Final    Comment: (NOTE) The Xpert Xpress SARS-CoV-2/FLU/RSV assay is intended as an aid in  the diagnosis of influenza from  Nasopharyngeal swab specimens and  should not be used as a sole basis for treatment. Nasal washings and  aspirates are unacceptable for Xpert Xpress SARS-CoV-2/FLU/RSV  testing. Fact Sheet for Patients: PinkCheek.be Fact Sheet for Healthcare Providers: GravelBags.it This  test is not yet approved or cleared by the [place] and  has been authorized for detection and/or diagnosis of SARS-CoV-2 by  FDA under an Emergency Use Authorization (EUA). This EUA will remain  in effect (meaning this test can be used) for the duration of the  Covid-19 declaration under Section 564(b)(1) of the Act, 21  U.S.C. section 360bbb-3(b)(1), unless the authorization is  terminated or revoked. Performed at [facility], [place] [address] [place], [place] [address]      Time coordinating discharge: 35 minutes  SIGNED:   Jonnie Finner, DO  [facility] 02/15/2019, 12:22 PM   If 7PM-7AM, please contact night-coverage [email or web address]

## 2019-02-15 NOTE — Discharge Instructions (Signed)
Gastrointestinal Bleeding Gastrointestinal (GI) bleeding is bleeding somewhere along the path that food travels through the body (digestive tract). This path is anywhere between the mouth and the opening of the butt (anus). You may have blood in your poop (stool) or have black poop. If you throw up (vomit), there may be blood in it. This condition can be mild, serious, or even life-threatening. If you have a lot of bleeding, you may need to stay in the hospital. What are the causes? This condition may be caused by:  Irritation and swelling of the esophagus (esophagitis). The esophagus is part of the body that moves food from your mouth to your stomach.  Swollen veins in the butt (hemorrhoids).  Areas of painful tearing in the opening of the butt (anal fissures). These are often caused by passing hard poop.  Pouches that form on the colon over time (diverticulosis).  Irritation and swelling (diverticulitis) in areas where pouches have formed on the colon.  Growths (polyps) or cancer. Colon cancer often starts out as growths that are not cancer.  Irritation of the stomach lining (gastritis).  Sores (ulcers) in the stomach. What increases the risk? You are more likely to develop this condition if you:  Have a certain type of infection in your stomach (Helicobacter pylori infection).  Take certain medicines.  Smoke.  Drink alcohol. What are the signs or symptoms? Common symptoms of this condition include:  Throwing up (vomiting) material that has bright red blood in it. It may look like coffee grounds.  Changes in your poop. The poop may: ? Have red blood in it. ? Be black, look like tar, and smell stronger than normal. ? Be red.  Pain or cramping in the belly (abdomen). How is this treated? Treatment for this condition depends on the cause of the bleeding. For example:  Sometimes, the bleeding can be stopped during a procedure that is done to find the problem (endoscopy or  colonoscopy).  Medicines can be used to: ? Help control irritation, swelling, or infection. ? Reduce acid in your stomach.  Certain problems can be treated with: ? Creams. ? Medicines that are put in the butt (suppositories). ? Warm baths.  Surgery is sometimes needed.  If you lose a lot of blood, you may need a blood transfusion. If bleeding is mild, you may be allowed to go home. If there is a lot of bleeding, you will need to stay in the hospital. Follow these instructions at home:   Take over-the-counter and prescription medicines only as told by your doctor.  Eat foods that have a lot of fiber in them. These foods include beans, whole grains, and fresh fruits and vegetables. You can also try eating 1-3 prunes each day.  Drink enough fluid to keep your pee (urine) pale yellow.  Keep all follow-up visits as told by your doctor. This is important. Contact a doctor if:  Your symptoms do not get better. Get help right away if:  Your bleeding does not stop.  You feel dizzy or you pass out (faint).  You feel weak.  You have very bad cramps in your back or belly.  You pass large clumps of blood (clots) in your poop.  Your symptoms are getting worse.  You have chest pain or fast heartbeats. Summary  GI bleeding is bleeding somewhere along the path that food travels through the body (digestive tract).  This bleeding can be caused by many things. Treatment depends on the cause of the bleeding.    Take medicines only as told by your doctor.  Keep all follow-up visits as told by your doctor. This is important. This information is not intended to replace advice given to you by your health care provider. Make sure you discuss any questions you have with your health care provider. Document Revised: 08/23/2017 Document Reviewed: 08/23/2017 Elsevier Patient Education  [place], Vitamin K1 tablets What is this medicine? PHYTONADIONE (fye toe na DYE  one) is a man-made form of vitamin K. This medicine is used to treat vitamin K deficiency or bleeding problems caused by various disorders. This medicine may be used for other purposes; ask your health care provider or pharmacist if you have questions. COMMON BRAND NAME(S): Mephyton What should I tell my health care provider before I take this medicine? They need to know if you have any of these conditions:  liver disease  an unusual or allergic reaction to phytonadione, other medicine, foods, dyes, or preservatives  pregnant or trying to get pregnant  breast-feeding How should I use this medicine? Take this medicine by mouth with a glass of water. Follow the directions on the prescription label. Take your doses at regular intervals. Do not take your medicine more often than directed. Talk to your pediatrician regarding the use of this medicine in children. While this drug may be prescribed for selected conditions, precautions do apply. Overdosage: If you think you have taken too much of this medicine contact a poison control center or emergency room at once. NOTE: This medicine is only for you. Do not share this medicine with others. What if I miss a dose? If you miss a dose, take it as soon as you can. If it is almost time for your next dose, take only that dose. Do not take double or extra doses. What may interact with this medicine?  cholestyramine  colestipol  warfarin This list may not describe all possible interactions. Give your health care provider a list of all the medicines, herbs, non-prescription drugs, or dietary supplements you use. Also tell them if you smoke, drink alcohol, or use illegal drugs. Some items may interact with your medicine. What should I watch for while using this medicine? Visit your doctor or health care professional for regular checks on your progress. Your doctor or health care professional will schedule tests to make sure the medicine is working  properly. What side effects may I notice from receiving this medicine? Side effects that you should report to your doctor or health care professional as soon as possible:  allergic reactions like skin rash, itching or hives, swelling of the face, lips, or tongue  bluish discoloration of lips, fingernails, or palms of hands  increased sweating  shortness of breath Side effects that usually do not require medical attention (report to your doctor or health care professional if they continue or are bothersome):  changes in taste  dizziness  flushing of the face This list may not describe all possible side effects. Call your doctor for medical advice about side effects. You may report side effects to FDA at 1-800-FDA-1088. Where should I keep my medicine? Keep out of the reach of children. Store at room temperature between 15 and 30 degrees C (59 and 86 degrees F). Protect from light. Store in tightly closed container and original carton until contents have been used. Throw away any unused medicine after the expiration date. NOTE: This sheet is a summary. It may not cover all possible information. If you have questions about  this medicine, talk to your doctor, pharmacist, or health care provider.  2020 Elsevier/Gold Standard (2007-08-13 17:26:46)

## 2019-02-16 LAB — CULTURE, BLOOD (ROUTINE X 2)
Culture: NO GROWTH
Special Requests: ADEQUATE

## 2019-02-18 ENCOUNTER — Other Ambulatory Visit: Payer: Self-pay | Admitting: *Deleted

## 2019-02-18 ENCOUNTER — Encounter: Payer: Self-pay | Admitting: *Deleted

## 2019-02-18 NOTE — Patient Outreach (Addendum)
[facility]) Care Management  02/18/2019  Joshua Hubbard 03-Mar-1975 [id number]  Transition of care call/case closure   Referral received: 02/12/19 Initial outreach: 02/18/19 Insurance: [employer] Health Save Plan   Subjective: Initial successful telephone call to patient's mobile number in order to complete transition of care assessment; 2 HIPAA identifiers verified. Explained purpose of call and completed transition of care assessment.  Joshua Hubbard states he is doing Ok, says he has about 60% of his stamina back,  denies pain or further GI bleeding, tolerating diet, denies bowel or bladder problems.  Spouse, [name] assisted with his initial recovery.  He denies any ongoing health issues, other than ETOH abuse,  and says he does not need a referral to one of the [place] chronic disease management programs. He was receptive to receiving information again on [place] [facility] for substance abuse assistance in addition to continuing to attend Alcoholics Anonymous.  He says he doesn't think he purchased the hospital indemnity insurance but will check his benefits and file a claim if he does He says he uses the [facility] outpatient pharmacy on [name]  He denies educational needs related to staying safe during the COVID 19 pandemic. He says he will receive the Covid vaccine when it is available and says he is relieved that  his elderly Dad was able to receive his first vaccine recently.   Objective:  Joshua Hubbard, a [employer] employee,  was hospitalized at [facility] from 1/18/- 02/15/2019 after he was taken to the  [facility] Emergency Department via EMS for syncope and bloody emesis. AN upper endoscopy done 02/12/19 showed gastritis and 3 columns of large esophageal varices that were treated with banding. Comorbidities include: colitis, previous GI bleed, liver cirrhosis, ETOH abuse, arthritis, voice hoarseness, thrombocytopenia, hypokalemia. He  required 4 units of PRBC and 1 unit of fresh frozen plasma due to severe anemia from upper GI bleeding with H/H= 4.6/16.8 on 02/10/19.  He was discharged to home on 02/15/2019 without the need for home health services or DME.   Assessment:  Patient voices good understanding of all discharge instructions.  See transition of care flowsheet for assessment details.   Plan:  Reviewed hospital discharge diagnosis of upper GI bleed and discharge treatment plan using hospital discharge instructions, assessing medication adherence, reviewing problems requiring provider notification, and discussing the importance of follow up with primary care provider with labs of INR and CBC and with gastroenterologist for repeat upper endoscopy, initial colonoscopy and MRI of liver to evaluate cystic lesions/? hemangioma seen on abdominal CT done 02/10/19. After reviewing benefits and with [name]'s permission, provided him with phone number of [place].  No other ongoing care management needs identified so will close case to [place] [facility] [place] [facility] services and route successful outreach letter with [place] [employer] pamphlet and 24 Hour Nurse Line Magnet and Employee Assistance Counseling Program brochure to [place] [employer] clinical pool to be mailed to patient's home address.  Thanked patient for their services to [facility].  Barrington Ellison [place] Management Coordinator Office Phone [phone] Office Fax [phone]

## 2019-02-19 ENCOUNTER — Other Ambulatory Visit: Payer: Self-pay

## 2019-02-19 ENCOUNTER — Ambulatory Visit (INDEPENDENT_AMBULATORY_CARE_PROVIDER_SITE_OTHER): Payer: 59 | Admitting: Family Medicine

## 2019-02-19 DIAGNOSIS — D696 Thrombocytopenia, unspecified: Secondary | ICD-10-CM | POA: Diagnosis not present

## 2019-02-19 DIAGNOSIS — K7030 Alcoholic cirrhosis of liver without ascites: Secondary | ICD-10-CM

## 2019-02-19 DIAGNOSIS — K922 Gastrointestinal hemorrhage, unspecified: Secondary | ICD-10-CM | POA: Diagnosis not present

## 2019-02-21 NOTE — Assessment & Plan Note (Signed)
Recheck CBC. 

## 2019-02-21 NOTE — Assessment & Plan Note (Signed)
" >>  ASSESSMENT AND PLAN FOR THROMBOCYTOPENIA WRITTEN ON 02/21/2019  8:58 AM BY Yehudis Monceaux L, MD  Check CBC "

## 2019-02-21 NOTE — Progress Notes (Signed)
    CHIEF COMPLAINT / HPI: Discussed recent hospitalization for upper GI bleed, cirrhosis.  Meld score at time of admission was 38.   PERTINENT  PMH / PSH: I have reviewed the patient's medications, allergies, past medical and surgical history, smoking status and updated in the EMR as appropriate.   OBJECTIVE:  There were no vitals taken for this visit.  Well-developed male, no acute distress ASSESSMENT / PLAN:   Upper GI bleed Recheck CBC.  Liver cirrhosis ([place]) Lengthy discussion about prognosis with meld score 38.  Went through that in detail.  Discussed pros and cons of treatment options.  Explained absolute necessity to stop all alcohol immediately or he may lose his life.  Total amount of face-to-face time in counseling and education was 45 minutes. I will see him back in office 2 weeks.  Thrombocytopenia ([place]) Check CBC   Dorcas Mcmurray MD

## 2019-02-21 NOTE — Assessment & Plan Note (Signed)
Check CBC 

## 2019-02-21 NOTE — Assessment & Plan Note (Addendum)
Lengthy discussion about prognosis with meld score 38.  Went through that in detail.  Discussed pros and cons of treatment options.  Explained absolute necessity to stop all alcohol immediately or he may lose his life.  Total amount of face-to-face time in counseling and education was 45 minutes. I will see him back in office 2 weeks.

## 2019-02-22 ENCOUNTER — Other Ambulatory Visit: Payer: 59

## 2019-02-22 ENCOUNTER — Other Ambulatory Visit: Payer: Self-pay

## 2019-02-22 DIAGNOSIS — K7030 Alcoholic cirrhosis of liver without ascites: Secondary | ICD-10-CM

## 2019-02-22 DIAGNOSIS — K922 Gastrointestinal hemorrhage, unspecified: Secondary | ICD-10-CM | POA: Diagnosis not present

## 2019-02-23 LAB — COMPREHENSIVE METABOLIC PANEL
ALT: 28 IU/L (ref 0–44)
AST: 63 IU/L — ABNORMAL HIGH (ref 0–40)
Albumin/Globulin Ratio: 1.1 — ABNORMAL LOW (ref 1.2–2.2)
Albumin: 3.5 g/dL — ABNORMAL LOW (ref 4.0–5.0)
Alkaline Phosphatase: 90 IU/L (ref 39–117)
BUN/Creatinine Ratio: 6 — ABNORMAL LOW (ref 9–20)
BUN: 4 mg/dL — ABNORMAL LOW (ref 6–24)
Bilirubin Total: 1.3 mg/dL — ABNORMAL HIGH (ref 0.0–1.2)
CO2: 24 mmol/L (ref 20–29)
Calcium: 8.9 mg/dL (ref 8.7–10.2)
Chloride: 101 mmol/L (ref 96–106)
Creatinine, Ser: 0.63 mg/dL — ABNORMAL LOW (ref 0.76–1.27)
GFR calc Af Amer: 139 mL/min/{1.73_m2} (ref >59)
GFR calc non Af Amer: 120 mL/min/{1.73_m2} (ref >59)
Globulin, Total: 3.3 g/dL (ref 1.5–4.5)
Glucose: 94 mg/dL (ref 65–99)
Potassium: 4.3 mmol/L (ref 3.5–5.2)
Sodium: 137 mmol/L (ref 134–144)
Total Protein: 6.8 g/dL (ref 6.0–8.5)

## 2019-02-23 LAB — CBC
Hematocrit: 31.8 % — ABNORMAL LOW (ref 37.5–51.0)
Hemoglobin: 9.2 g/dL — ABNORMAL LOW (ref 13.0–17.7)
MCH: 23.7 pg — ABNORMAL LOW (ref 26.6–33.0)
MCHC: 28.9 g/dL — ABNORMAL LOW (ref 31.5–35.7)
MCV: 82 fL (ref 79–97)
Platelets: 224 10*3/uL (ref 150–450)
RBC: 3.88 x10E6/uL — ABNORMAL LOW (ref 4.14–5.80)
RDW: 21.3 % — ABNORMAL HIGH (ref 11.6–15.4)
WBC: 4.9 10*3/uL (ref 3.4–10.8)

## 2019-02-23 LAB — PROTIME-INR
INR: 1.2 (ref 0.9–1.2)
Prothrombin Time: 13 s — ABNORMAL HIGH (ref 9.1–12.0)

## 2019-02-25 ENCOUNTER — Encounter: Payer: Self-pay | Admitting: Family Medicine

## 2019-03-15 ENCOUNTER — Other Ambulatory Visit: Payer: Self-pay

## 2019-03-15 ENCOUNTER — Ambulatory Visit (INDEPENDENT_AMBULATORY_CARE_PROVIDER_SITE_OTHER): Payer: 59 | Admitting: Family Medicine

## 2019-03-15 ENCOUNTER — Encounter: Payer: Self-pay | Admitting: Family Medicine

## 2019-03-15 VITALS — BP 120/60

## 2019-03-15 DIAGNOSIS — M25561 Pain in right knee: Secondary | ICD-10-CM | POA: Diagnosis not present

## 2019-03-15 DIAGNOSIS — F1019 Alcohol abuse with unspecified alcohol-induced disorder: Secondary | ICD-10-CM

## 2019-03-15 DIAGNOSIS — K922 Gastrointestinal hemorrhage, unspecified: Secondary | ICD-10-CM

## 2019-03-16 NOTE — Assessment & Plan Note (Signed)
No sign of current GI bleeding.  His hemoglobin was stable at last check.  He is feeling pretty well.  He has follow-up with GI.

## 2019-03-16 NOTE — Progress Notes (Signed)
  Joshua Hubbard - 44 y.o. male MRN [id number]  Date of birth: 1975-08-26    SUBJECTIVE:      Chief Complaint:/ HPI:    #1.  Knee pain, right.  Occurred 2 days ago when a large dog that belongs to the family was roughhousing with him.  Noted some stiffness that night and since.  No swelling.  History of prior Achilles repair on that side.  No locking or giving way. #2. Cirrhosis: No unusual bleeding.  No abdominal pain. #3.  Alcohol abuse: Reports no alcohol intake since last hospitalization..  Seeing therapist twice a week.   OBJECTIVE: BP 120/60   Physical Exam:  Vital signs are reviewed. GENERAL: Well-developed, no acute distress Abdomen: Nontender.  No rebound or guarding.  Increased girth is baseline. KNEE: Right.  MS intact to varus and valgus stress.  Popliteal space is benign.  Calf is soft.  Mildly tender palpation to distal portion of the quadricep tendon but tendon is intact as is flexion extension.  No effusion.  ASSESSMENT & PLAN:   Knee pain, contusion.  The quadricep tendon is intact.  I recommended some single-leg squats for rehab.  Follow-up if no improvement after about 2 weeks. See problem based charting & AVS for pt instructions. Hx of  Upper GI bleed No sign of current GI bleeding.  His hemoglobin was stable at last check.  He is feeling pretty well.  He has follow-up with GI.  Alcohol abuse with unspecified alcohol-induced disorder ([place]) Discussed and congratulated on current sobriety. Continue CBT. Again discussed various alcohol support groups.

## 2019-03-16 NOTE — Assessment & Plan Note (Addendum)
Discussed and congratulated on current sobriety. Continue CBT. Again discussed various alcohol support groups. F/u 4-6 weeks.

## 2019-03-21 ENCOUNTER — Other Ambulatory Visit: Payer: Self-pay | Admitting: Family Medicine

## 2019-03-21 MED FILL — PANTOPRAZOLE SOD DR 40 MG T: 40 | 30 days supply | Qty: 30 | Fill #0

## 2019-03-26 MED FILL — clonazePAM 1 MG TABS: 1 | 90 days supply | Qty: 90 | Fill #0

## 2019-03-27 MED FILL — PROPRANOLOL HCL 10 MG TAB: 10 | 30 days supply | Qty: 60 | Fill #0

## 2019-04-19 ENCOUNTER — Other Ambulatory Visit: Payer: Self-pay

## 2019-04-19 ENCOUNTER — Ambulatory Visit (INDEPENDENT_AMBULATORY_CARE_PROVIDER_SITE_OTHER): Payer: 59 | Admitting: Family Medicine

## 2019-04-19 VITALS — BP 130/70

## 2019-04-19 DIAGNOSIS — L989 Disorder of the skin and subcutaneous tissue, unspecified: Secondary | ICD-10-CM

## 2019-04-19 NOTE — Progress Notes (Signed)
    CHIEF COMPLAINT / HPI: #1.  Skin lesion.  He has had a lesion on his left upper arm for quite a while.  In the last few weeks he has noticed that it has been bleeding intermittently. 2.  Continues to work hard in his battle with substance abuse.   PERTINENT  PMH / PSH: I have reviewed the patient's medications, allergies, past medical and surgical history, smoking status and updated in the EMR as appropriate.   OBJECTIVE:  BP 130/70  SKIN: Posterior left upper arm reveals a classic seborrheic keratosis.  Right below this is a 6 mm long lesion that is currently open from excoriation.  Surrounding tissue looks a little bit like scar tissue.  ASSESSMENT / PLAN: #1.  Skin lesion: Definitely has a seborrheic keratosis which we can address at some point.  Would like to see him back and do a biopsy of the lesion right below that.  No problem-specific Assessment & Plan notes found for this encounter.   Dorcas Mcmurray MD

## 2019-05-03 ENCOUNTER — Other Ambulatory Visit: Payer: Self-pay

## 2019-05-03 ENCOUNTER — Encounter: Payer: Self-pay | Admitting: Family Medicine

## 2019-05-03 ENCOUNTER — Ambulatory Visit (INDEPENDENT_AMBULATORY_CARE_PROVIDER_SITE_OTHER): Payer: 59 | Admitting: Family Medicine

## 2019-05-03 VITALS — Ht 69.0 in | Wt 180.0 lb

## 2019-05-03 DIAGNOSIS — L989 Disorder of the skin and subcutaneous tissue, unspecified: Secondary | ICD-10-CM

## 2019-05-03 DIAGNOSIS — L905 Scar conditions and fibrosis of skin: Secondary | ICD-10-CM | POA: Diagnosis not present

## 2019-05-03 DIAGNOSIS — L821 Other seborrheic keratosis: Secondary | ICD-10-CM | POA: Diagnosis not present

## 2019-05-03 DIAGNOSIS — Z658 Other specified problems related to psychosocial circumstances: Secondary | ICD-10-CM | POA: Diagnosis not present

## 2019-05-03 MED ORDER — FLUOXETINE HCL 20 MG PO TABS: 20.0000 mg | ORAL_TABLET | Freq: Every day | ORAL | 3 refills | Status: DC

## 2019-05-03 MED FILL — FLUoxetine HCL 20 MG TABS: 20 | 30 days supply | Qty: 30 | Fill #0

## 2019-05-03 NOTE — Progress Notes (Signed)
  NAJEE NEUGEBAUER - 44 y.o. male MRN [id number]  Date of birth: [birth year]    SUBJECTIVE:      Chief Complaint:/ HPI:  Left arm skin lesion: here for followup. It has stopped bleeding but remains there. Still concerned about it. 2. Stressors: working his program, found a 'down and dirty" meeting which he enjoyed and plans to attend on goping. Some folks there have reached out to offer support. Home still stressful.    OBJECTIVE: Ht 5\' 9"  (1.753 m)   Wt 180 lb (81.6 kg)   BMI 26.58 kg/m   Physical Exam:  Vital signs are reviewed. GENERAL: Well developed, well nourished and no acute distress. RESPIRATORY: respiratory rate and effort normal CARDIOVASCULAR: RRR. MSK: Normal gait, normal muscle bulk and tone.  PSYCH: Alert and oriented. Normal speech fluency and content. Asks and answers questions appropriately. No agitation noted. Affect somewhat constricted. NEURO: no gross focal motor deficits are noted. SKIN: left posterior upper arm 11x7 mm irregular lesion adjacent to a typical appearing sk. 4 mm sizePunch biopsy  of skin lesion performed without problem. Minimal bleeding.   ASSESSMENT & PLAN:  See problem based charting & AVS for pt instructions. Skin lesion Punch biopsy today. The adjacent lesion is a normal appearing sebrrheic keratosis.  Psychosocial stressors Discussion. He is having some depressive symptoms. We decided to start SSRI and f/u 2-4 weeks. Sooner with problems.

## 2019-05-03 NOTE — Assessment & Plan Note (Addendum)
Discussion 30 minutes. Joshua Hubbard He is having some depressive symptoms. We decided to start SSRI and f/u 2-4 weeks. Sooner with problems.

## 2019-05-03 NOTE — Addendum Note (Signed)
Addended by: [place] Pink on: 05/03/2019 02:21 PM   Modules accepted: Orders

## 2019-05-03 NOTE — Assessment & Plan Note (Signed)
Punch biopsy today. The adjacent lesion is a normal appearing sebrrheic keratosis.

## 2019-05-07 ENCOUNTER — Encounter: Payer: Self-pay | Admitting: Family Medicine

## 2019-05-30 MED FILL — PANTOPRAZOLE SOD DR 40 MG T: 40 | 30 days supply | Qty: 30 | Fill #1

## 2019-05-30 MED FILL — PROPRANOLOL HCL 10 MG TAB: 10 | 30 days supply | Qty: 60 | Fill #1

## 2019-06-21 MED FILL — clonazePAM 1 MG TABS: 1 | 90 days supply | Qty: 90 | Fill #1

## 2019-07-12 MED FILL — PROPRANOLOL HCL 10 MG TAB: 10 | 30 days supply | Qty: 60 | Fill #2

## 2019-07-12 MED FILL — PANTOPRAZOLE SOD DR 40 MG T: 40 | 30 days supply | Qty: 30 | Fill #2

## 2019-08-30 ENCOUNTER — Other Ambulatory Visit: Payer: Self-pay

## 2019-08-30 ENCOUNTER — Ambulatory Visit (INDEPENDENT_AMBULATORY_CARE_PROVIDER_SITE_OTHER): Payer: 59 | Admitting: Family Medicine

## 2019-08-30 DIAGNOSIS — F1019 Alcohol abuse with unspecified alcohol-induced disorder: Secondary | ICD-10-CM

## 2019-08-30 DIAGNOSIS — Z658 Other specified problems related to psychosocial circumstances: Secondary | ICD-10-CM

## 2019-08-30 MED ORDER — BUPROPION HCL ER (XL) 150 MG PO TB24: 150.0000 mg | ORAL_TABLET | Freq: Every day | ORAL | 2 refills | Status: DC

## 2019-09-02 NOTE — Assessment & Plan Note (Signed)
Change to  Wellbutrin. F/u 3-4 weeks.

## 2019-09-02 NOTE — Assessment & Plan Note (Signed)
Again we discussed absolute need for counseling or other support system. He agrees to contact EAP.

## 2019-09-02 NOTE — Progress Notes (Signed)
  OJANI BERENSON - 44 y.o. male MRN [id number]  Date of birth: 01/05/1976    SUBJECTIVE:      Chief Complaint:/ HPI:   1. Anxiety and depression in setting of intermittent alcohol use: fluoxetine made him feel a little paranoid by about the third week so he stopped it. Agress it also may have been situational. His wife left in [place] and he had a 4 day alcohol bender. None since. Having a lot of problems focusing. Denies SI/HI    OBJECTIVE: BP 130/72   Ht 5\' 9"  (1.753 m)   Wt 180 lb (81.6 kg)   BMI 26.58 kg/m   Physical Exam:  Vital signs are reviewed. PSYCH: AxOx4. Good eye contact.. No psychomotor retardation or agitation. Appropriate speech fluency and content. Asks and answers questions appropriately. Mood is congruent.   ASSESSMENT & PLAN:  See problem based charting & AVS for pt instructions. Alcohol abuse with unspecified alcohol-induced disorder ([place]) Again we discussed absolute need for counseling or other support system. He agrees to contact EAP.  Psychosocial stressors Change to  Wellbutrin. F/u 3-4 weeks.

## 2019-09-04 MED FILL — PANTOPRAZOLE SOD DR 40 MG T: 40 | 30 days supply | Qty: 30 | Fill #3

## 2019-09-13 MED FILL — PROPRANOLOL HCL 10 MG TAB: 10 | 30 days supply | Qty: 60 | Fill #3

## 2019-09-20 MED FILL — clonazePAM 1 MG TABS: 1 | 90 days supply | Qty: 90 | Fill #2

## 2019-09-21 ENCOUNTER — Other Ambulatory Visit: Payer: Self-pay | Admitting: Family Medicine

## 2019-09-27 ENCOUNTER — Ambulatory Visit: Payer: 59 | Admitting: Family Medicine

## 2019-10-03 ENCOUNTER — Emergency Department (HOSPITAL_COMMUNITY)
Admission: EM | Admit: 2019-10-03 | Discharge: 2019-10-04 | Disposition: A | Discharge status: Home / Self Care | Payer: 59 | Attending: Emergency Medicine | Admitting: Emergency Medicine

## 2019-10-03 ENCOUNTER — Encounter (HOSPITAL_COMMUNITY): Payer: Self-pay | Admitting: Emergency Medicine

## 2019-10-03 ENCOUNTER — Other Ambulatory Visit: Payer: Self-pay

## 2019-10-03 DIAGNOSIS — K7031 Alcoholic cirrhosis of liver with ascites: Secondary | ICD-10-CM | POA: Insufficient documentation

## 2019-10-03 DIAGNOSIS — Z20822 Contact with and (suspected) exposure to covid-19: Secondary | ICD-10-CM | POA: Diagnosis not present

## 2019-10-03 DIAGNOSIS — Z79899 Other long term (current) drug therapy: Secondary | ICD-10-CM | POA: Diagnosis not present

## 2019-10-03 DIAGNOSIS — R140 Abdominal distension (gaseous): Secondary | ICD-10-CM | POA: Diagnosis not present

## 2019-10-03 DIAGNOSIS — R1084 Generalized abdominal pain: Secondary | ICD-10-CM | POA: Insufficient documentation

## 2019-10-03 LAB — CBC
HCT: 28.1 % — ABNORMAL LOW (ref 39.0–52.0)
Hemoglobin: 8.5 g/dL — ABNORMAL LOW (ref 13.0–17.0)
MCH: 20.9 pg — ABNORMAL LOW (ref 26.0–34.0)
MCHC: 30.2 g/dL (ref 30.0–36.0)
MCV: 69.2 fL — ABNORMAL LOW (ref 80.0–100.0)
Platelets: 134 10*3/uL — ABNORMAL LOW (ref 150–400)
RBC: 4.06 MIL/uL — ABNORMAL LOW (ref 4.22–5.81)
RDW: 20.6 % — ABNORMAL HIGH (ref 11.5–15.5)
WBC: 10.1 10*3/uL (ref 4.0–10.5)
nRBC: 0 % (ref 0.0–0.2)

## 2019-10-03 LAB — COMPREHENSIVE METABOLIC PANEL
ALT: 31 U/L (ref 0–44)
AST: 126 U/L — ABNORMAL HIGH (ref 15–41)
Albumin: 2.4 g/dL — ABNORMAL LOW (ref 3.5–5.0)
Alkaline Phosphatase: 144 U/L — ABNORMAL HIGH (ref 38–126)
Anion gap: 9 (ref 5–15)
BUN: <5 mg/dL — ABNORMAL LOW (ref 6–20)
CO2: 23 mmol/L (ref 22–32)
Calcium: 8.2 mg/dL — ABNORMAL LOW (ref 8.9–10.3)
Chloride: 96 mmol/L — ABNORMAL LOW (ref 98–111)
Creatinine, Ser: 0.64 mg/dL (ref 0.61–1.24)
GFR calc Af Amer: >60 mL/min (ref >60)
GFR calc non Af Amer: >60 mL/min (ref >60)
Glucose, Bld: 86 mg/dL (ref 70–99)
Potassium: 3.1 mmol/L — ABNORMAL LOW (ref 3.5–5.1)
Sodium: 128 mmol/L — ABNORMAL LOW (ref 135–145)
Total Bilirubin: 4 mg/dL — ABNORMAL HIGH (ref 0.3–1.2)
Total Protein: 7.6 g/dL (ref 6.5–8.1)

## 2019-10-03 LAB — TROPONIN I (HIGH SENSITIVITY): Troponin I (High Sensitivity): 6 ng/L (ref <18)

## 2019-10-03 LAB — LIPASE, BLOOD: Lipase: 62 U/L — ABNORMAL HIGH (ref 11–51)

## 2019-10-03 NOTE — ED Triage Notes (Signed)
Pt reports abd distention and soreness x 1 month.  Reports epigastric pain since Sunday.  Eyes appear jaundice.

## 2019-10-04 ENCOUNTER — Encounter (HOSPITAL_BASED_OUTPATIENT_CLINIC_OR_DEPARTMENT_OTHER): Payer: Self-pay | Admitting: *Deleted

## 2019-10-04 ENCOUNTER — Emergency Department (HOSPITAL_COMMUNITY)
Admission: RE | Admit: 2019-10-04 | Discharge: 2019-10-04 | Disposition: A | Discharge status: Home / Self Care | Payer: 59 | Source: Ambulatory Visit | Attending: Family Medicine | Admitting: Family Medicine

## 2019-10-04 ENCOUNTER — Emergency Department (HOSPITAL_BASED_OUTPATIENT_CLINIC_OR_DEPARTMENT_OTHER)
Admission: EM | Admit: 2019-10-04 | Discharge: 2019-10-04 | Disposition: A | Discharge status: Home / Self Care | Payer: 59 | Source: Home / Self Care | Attending: Emergency Medicine | Admitting: Emergency Medicine

## 2019-10-04 ENCOUNTER — Ambulatory Visit (INDEPENDENT_AMBULATORY_CARE_PROVIDER_SITE_OTHER): Payer: 59 | Admitting: Family Medicine

## 2019-10-04 ENCOUNTER — Other Ambulatory Visit: Payer: Self-pay

## 2019-10-04 DIAGNOSIS — R1013 Epigastric pain: Secondary | ICD-10-CM

## 2019-10-04 DIAGNOSIS — F1019 Alcohol abuse with unspecified alcohol-induced disorder: Secondary | ICD-10-CM

## 2019-10-04 DIAGNOSIS — K8020 Calculus of gallbladder without cholecystitis without obstruction: Secondary | ICD-10-CM | POA: Diagnosis not present

## 2019-10-04 DIAGNOSIS — K7030 Alcoholic cirrhosis of liver without ascites: Secondary | ICD-10-CM | POA: Diagnosis not present

## 2019-10-04 DIAGNOSIS — K7031 Alcoholic cirrhosis of liver with ascites: Secondary | ICD-10-CM

## 2019-10-04 DIAGNOSIS — I8510 Secondary esophageal varices without bleeding: Secondary | ICD-10-CM | POA: Diagnosis not present

## 2019-10-04 DIAGNOSIS — D689 Coagulation defect, unspecified: Secondary | ICD-10-CM | POA: Diagnosis not present

## 2019-10-04 DIAGNOSIS — R140 Abdominal distension (gaseous): Secondary | ICD-10-CM | POA: Diagnosis not present

## 2019-10-04 DIAGNOSIS — K7460 Unspecified cirrhosis of liver: Secondary | ICD-10-CM | POA: Diagnosis not present

## 2019-10-04 DIAGNOSIS — K7689 Other specified diseases of liver: Secondary | ICD-10-CM | POA: Diagnosis not present

## 2019-10-04 DIAGNOSIS — Z20822 Contact with and (suspected) exposure to covid-19: Secondary | ICD-10-CM | POA: Insufficient documentation

## 2019-10-04 DIAGNOSIS — Z79899 Other long term (current) drug therapy: Secondary | ICD-10-CM | POA: Insufficient documentation

## 2019-10-04 DIAGNOSIS — R1084 Generalized abdominal pain: Secondary | ICD-10-CM | POA: Diagnosis not present

## 2019-10-04 LAB — CBC WITH DIFFERENTIAL/PLATELET
Abs Immature Granulocytes: 0.04 10*3/uL (ref 0.00–0.07)
Basophils Absolute: 0.1 10*3/uL (ref 0.0–0.1)
Basophils Relative: 1 %
Eosinophils Absolute: 0.2 10*3/uL (ref 0.0–0.5)
Eosinophils Relative: 3 %
HCT: 27.5 % — ABNORMAL LOW (ref 39.0–52.0)
Hemoglobin: 8.7 g/dL — ABNORMAL LOW (ref 13.0–17.0)
Immature Granulocytes: 1 %
Lymphocytes Relative: 11 %
Lymphs Abs: 0.9 10*3/uL (ref 0.7–4.0)
MCH: 21.6 pg — ABNORMAL LOW (ref 26.0–34.0)
MCHC: 31.6 g/dL (ref 30.0–36.0)
MCV: 68.2 fL — ABNORMAL LOW (ref 80.0–100.0)
Monocytes Absolute: 1.3 10*3/uL — ABNORMAL HIGH (ref 0.1–1.0)
Monocytes Relative: 16 %
Neutro Abs: 5.5 10*3/uL (ref 1.7–7.7)
Neutrophils Relative %: 68 %
Platelets: 121 10*3/uL — ABNORMAL LOW (ref 150–400)
RBC: 4.03 MIL/uL — ABNORMAL LOW (ref 4.22–5.81)
RDW: 20.8 % — ABNORMAL HIGH (ref 11.5–15.5)
Smear Review: NORMAL
WBC: 8 10*3/uL (ref 4.0–10.5)
nRBC: 0 % (ref 0.0–0.2)

## 2019-10-04 LAB — COMPREHENSIVE METABOLIC PANEL
ALT: 31 U/L (ref 0–44)
AST: 140 U/L — ABNORMAL HIGH (ref 15–41)
Albumin: 2.6 g/dL — ABNORMAL LOW (ref 3.5–5.0)
Alkaline Phosphatase: 123 U/L (ref 38–126)
Anion gap: 11 (ref 5–15)
BUN: <5 mg/dL — ABNORMAL LOW (ref 6–20)
CO2: 23 mmol/L (ref 22–32)
Calcium: 7.9 mg/dL — ABNORMAL LOW (ref 8.9–10.3)
Chloride: 97 mmol/L — ABNORMAL LOW (ref 98–111)
Creatinine, Ser: 0.52 mg/dL — ABNORMAL LOW (ref 0.61–1.24)
GFR calc Af Amer: >60 mL/min (ref >60)
GFR calc non Af Amer: >60 mL/min (ref >60)
Glucose, Bld: 103 mg/dL — ABNORMAL HIGH (ref 70–99)
Potassium: 3.7 mmol/L (ref 3.5–5.1)
Sodium: 131 mmol/L — ABNORMAL LOW (ref 135–145)
Total Bilirubin: 5.2 mg/dL — ABNORMAL HIGH (ref 0.3–1.2)
Total Protein: 7.2 g/dL (ref 6.5–8.1)

## 2019-10-04 LAB — SARS CORONAVIRUS 2 BY RT PCR (HOSPITAL ORDER, PERFORMED IN ~~LOC~~ HOSPITAL LAB): SARS Coronavirus 2: NEGATIVE

## 2019-10-04 LAB — LIPASE, BLOOD: Lipase: 53 U/L — ABNORMAL HIGH (ref 11–51)

## 2019-10-04 LAB — PROTIME-INR
INR: 1.8 — ABNORMAL HIGH (ref 0.8–1.2)
Prothrombin Time: 20.6 seconds — ABNORMAL HIGH (ref 11.4–15.2)

## 2019-10-04 IMAGING — CT CT ABDOMEN WO/W CM
2 of 10 series · 10 of 46 positions shown, 15 images · IV contrast (omnipaque)
Comparison: [DATE]

CLINICAL DATA: Abdominal pain, acute nonlocalized abdominal pain.
Concern for portal thrombosis in the setting of liver disease.

EXAM:
CT ABDOMEN WITHOUT AND WITH CONTRAST
TECHNIQUE: Multidetector CT imaging of the abdomen was performed following the
standard protocol before and following the bolus administration of
intravenous contrast.
CONTRAST:  100mL OMNIPAQUE IOHEXOL 300 MG/ML  SOLN

[Series 5: venous · axial · portal-venous · 0.86mm/px · z∈[+942,+1176]mm · 7 of 104 slices shown, 12 images]
[im 13/104  soft-tissue]
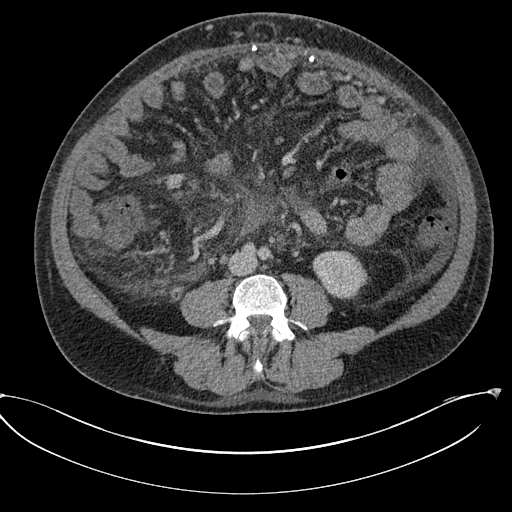
[im 13/104  bone]
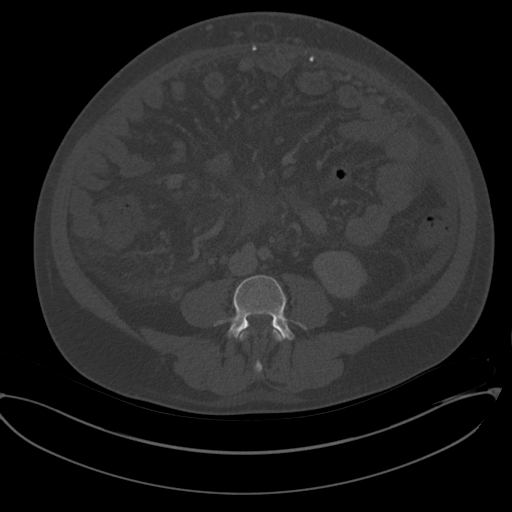
[im 26/104  soft-tissue]
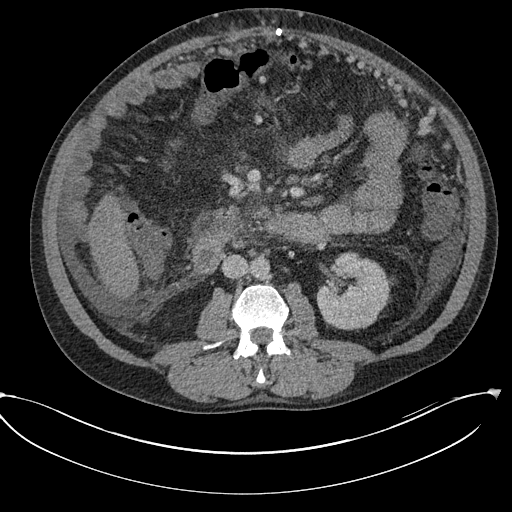
[im 39/104  soft-tissue]
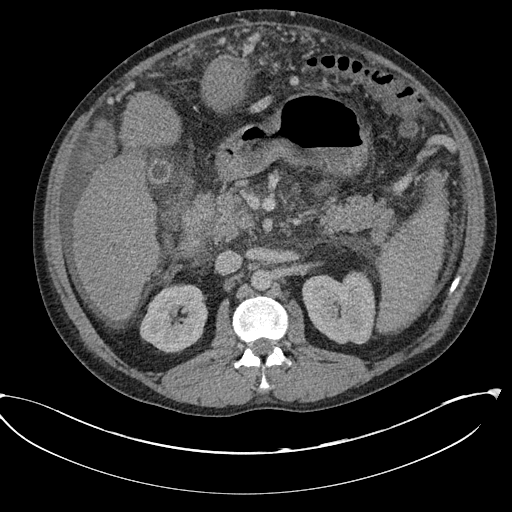
[im 52/104  soft-tissue]
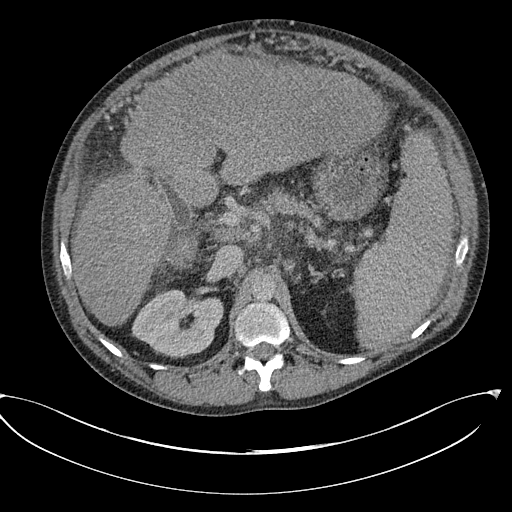
[im 52/104  lung]
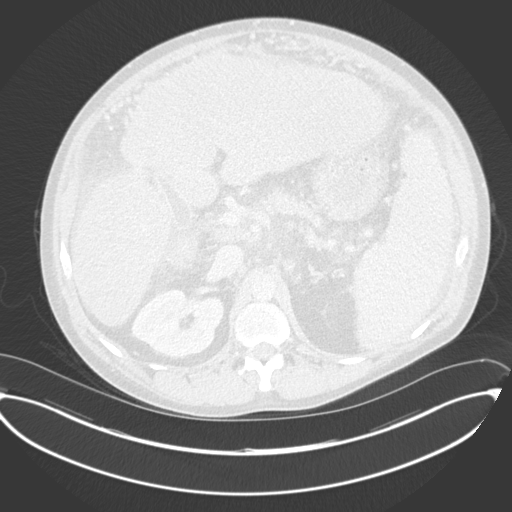
[im 65/104  soft-tissue]
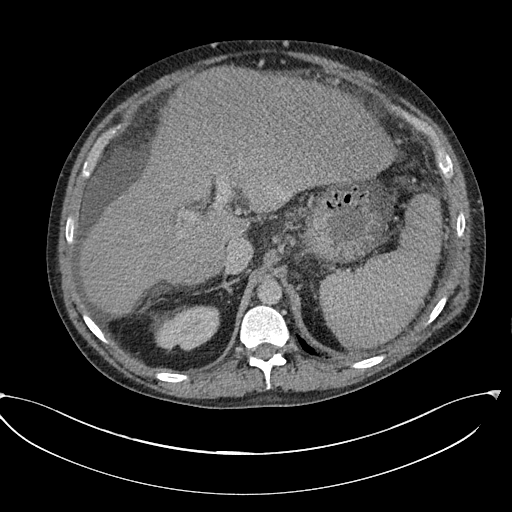
[im 65/104  lung]
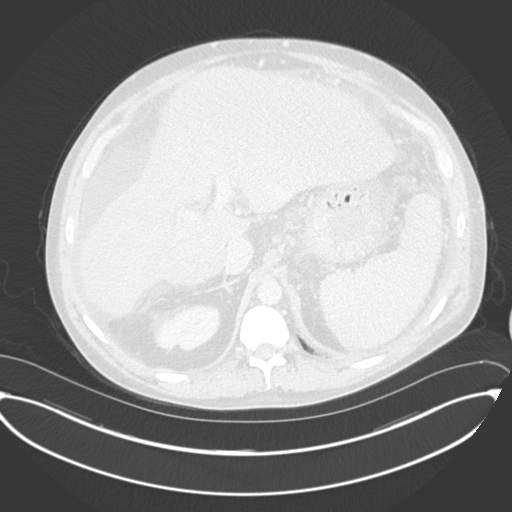
[im 78/104  soft-tissue]
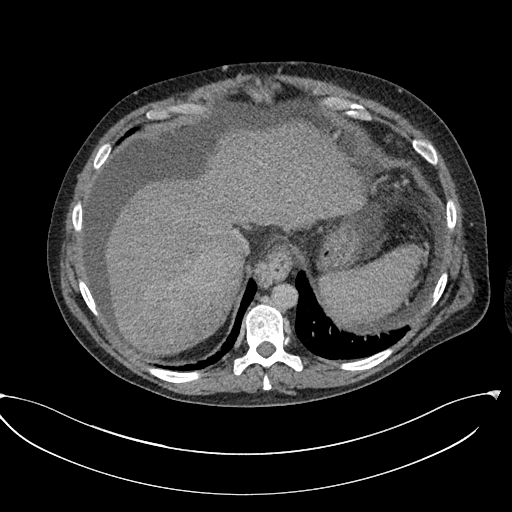
[im 78/104  lung]
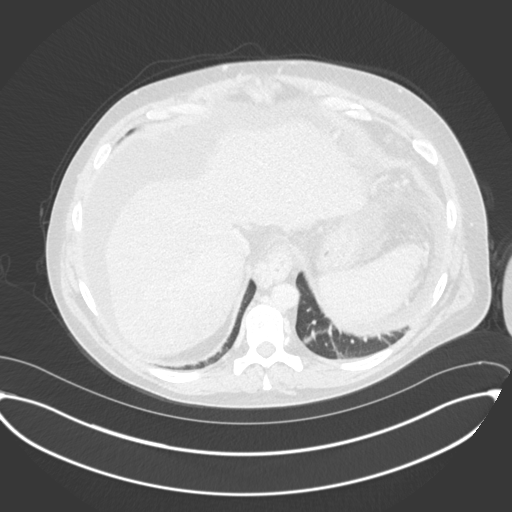
[im 91/104  soft-tissue]
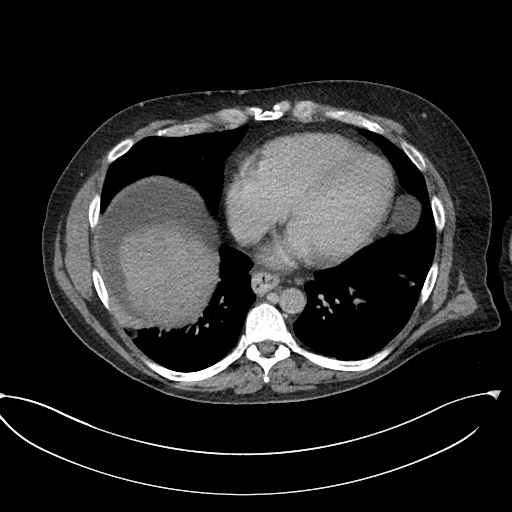
[im 91/104  lung]
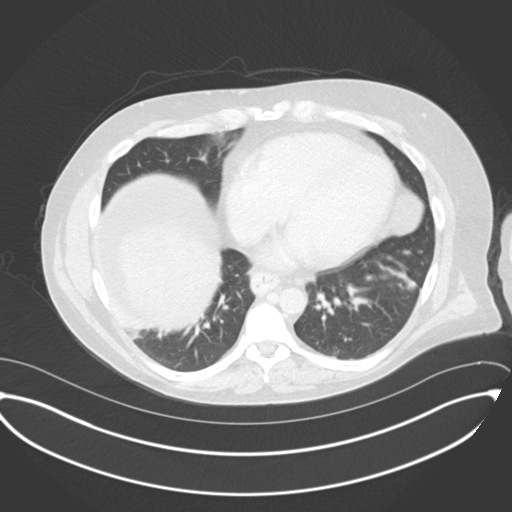

[Series 10: arterial cor · coronal · arterial · 0.63mm/px · 3 of 211 slices shown]
[im 43/211  soft-tissue]
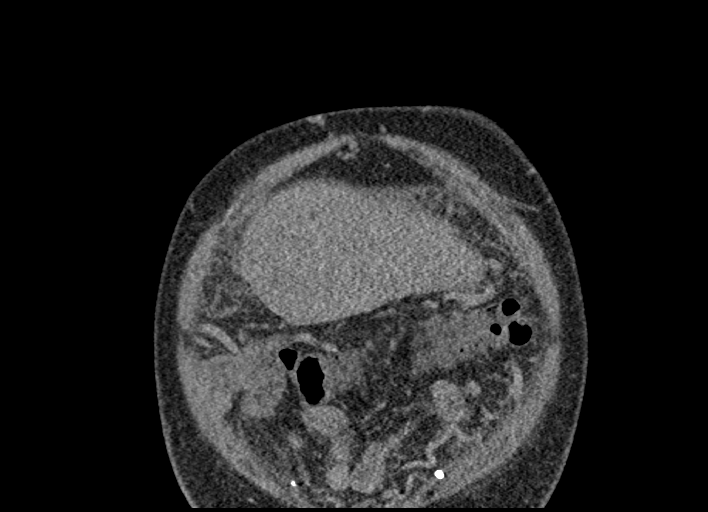
[im 85/211  soft-tissue]
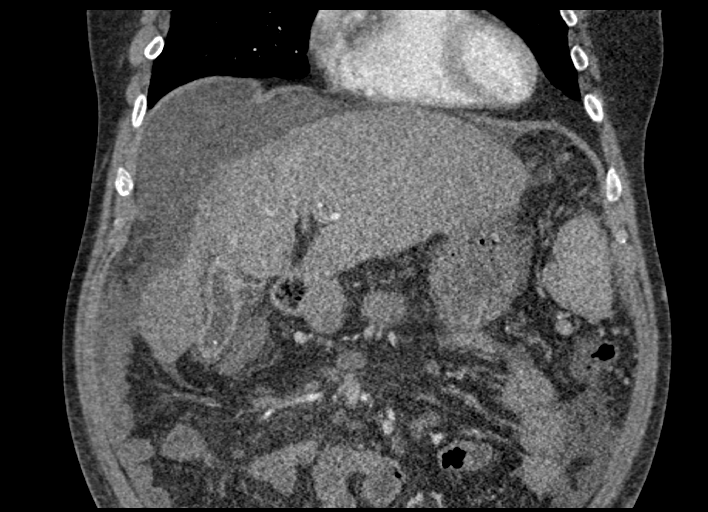
[im 127/211  soft-tissue]
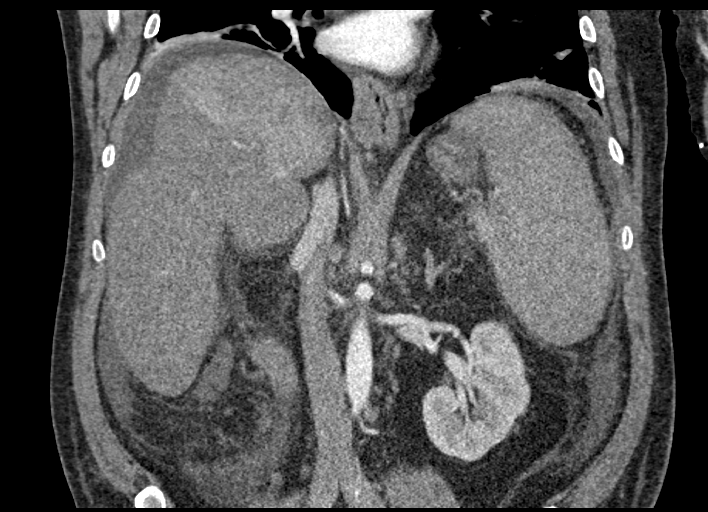

[10 of 46 positions shown; findings below may reference images not displayed]

FINDINGS: Lower chest: Basilar atelectasis. Nodular area at the LEFT lung base
(image 7, series 3) this measures approximately 10 mm. RIGHT
hemidiaphragm is elevated with juxta diaphragmatic atelectasis as
well. No effusion.

Hepatobiliary:

Liver again displays cirrhotic morphology with atrophy of the RIGHT
lobe and hypertrophy of LEFT lobe and caudate. There is considerable
background variation in liver density compatible with background
geographic steatotic change. Geographic area of mosaic enhancement
in the posterior RIGHT hepatic lobe, hepatic subsegment VII and VIII
extending towards the dome of the RIGHT hemi liver measuring
approximately 11 x 7 cm in greatest axial dimension. This area shows
washout on the delayed phase but is not defined lesion.

Hypervascular enhancement along the margin of hepatic subsegment
VIII with amorphous features persisting on later phases seen on
image 22 of series 5

Heterogeneous enhancement along the posterior margin of the lateral
segment of the LEFT hepatic lobe, hepatic subsegment II. This area
does not show washout.

The portal vein is patent.

Classic hepatic arterial anatomy arises from the celiac axis.

Gallbladder is collapsed with signs of cholelithiasis. Thickening
and under distension likely related to portal hypertension.

Pancreas: Pancreas without ductal dilation or sign of inflammation.

Spleen: Spleen remains enlarged similar to the prior study.

Adrenals/Urinary Tract: Adrenal glands are normal. Kidneys enhance
symmetrically without for abnormality. No hydronephrosis.

Stomach/Bowel: Signs of gastric and small bowel thickening as well
as colonic thickening perhaps more so along the RIGHT colon than
LEFT. No pneumatosis. The SMV is patent.

Vascular/Lymphatic: Extensive portosystemic collaterals in the upper
abdomen including esophageal varices, recanalized periumbilical
collaterals mainly arising from perisplenic and pericolonic
collaterals tracking over the anterior abdomen. No aneurysmal
dilation of the abdominal aorta. Enlarged periportal lymph nodes
often a nonspecific finding in the setting of liver disease, of
uncertain significance given findings in the RIGHT posterior hemi
liver described above. Largest lymph node approximately 1.8 cm about
the celiac axis. No retroperitoneal adenopathy.

Other: Moderate volume ascites which has increased since previous
imaging. Signs of ventral abdominal wall reconstruction with similar
appearance to the prior study.

Musculoskeletal: No acute musculoskeletal process. Spinal
degenerative changes.
IMPRESSION: 1. Findings suspicious for infiltrative hepatocellular carcinoma in
the RIGHT hepatic lobe LR category M. correlation with AFP and MRI
may be helpful for further characterization. There are other
scattered areas of hypervascular and or venous enhancement that are
seen on the current exam do not meet strict JIM criteria for
additional sites of neoplasm but would be better evaluated with MRI
to exclude this possibility.
2. Signs of worsening of stigmata of liver disease likely associated
with portal colopathy and increased ascites. There is also diffuse
bowel edema likely related to portal hypertension.
3. Portal vein is patent.
4. Enlarged periportal lymph nodes often a nonspecific finding in
the setting of liver disease, of uncertain significance given
findings in the RIGHT posterior hemi liver described above.
5. Nodular area at the LEFT lung base measures approximately 10 mm.
This may be related to atelectatic changes or scarring though given
findings in the liver would consider short interval follow-up with
chest CT to assess for resolution and or changes and to assess for
any other findings of concern in the chest.
6. Cholelithiasis.

These results will be called to the ordering clinician or
representative by the Radiologist Assistant, and communication
documented in the PACS or [REDACTED].

## 2019-10-04 MED ORDER — SODIUM CHLORIDE 0.9 % IV BOLUS
1000.0000 mL | Freq: Once | INTRAVENOUS | Status: AC
  Administered 2019-10-04: 1000 mL via INTRAVENOUS

## 2019-10-04 MED ORDER — KETOROLAC TROMETHAMINE 15 MG/ML IJ SOLN
15.0000 mg | Freq: Once | INTRAMUSCULAR | Status: AC
  Administered 2019-10-04: 15 mg via INTRAVENOUS
  Filled 2019-10-04: qty 1

## 2019-10-04 MED ORDER — IOHEXOL 300 MG/ML  SOLN
100.0000 mL | Freq: Once | INTRAMUSCULAR | Status: AC | PRN
  Administered 2019-10-04: 100 mL via INTRAVENOUS

## 2019-10-04 NOTE — ED Provider Notes (Signed)
[facility] EMERGENCY DEPARTMENT Provider Note   CSN: [id number] Arrival date & time: 10/04/19  8242     History Chief Complaint  Patient presents with  . Abdominal Pain    Joshua Hubbard is a 44 y.o. male.  44 yo M with a significant past medical history of cirrhosis comes in with a chief complaints of right upper quadrant abdominal discomfort.  Going on for the past couple months.  Started to have significant weight gain over the past few weeks.  Also having bowel movements every time he eats or drinks for the past couple weeks or so.  No vomiting no fevers no trauma.  Denies recent alcohol use.  Came into the ED yesterday but due to the long wait was not able to be seen by provider.  The history is provided by the patient.  Abdominal Pain Pain location:  RUQ Pain quality: aching   Pain radiates to:  Does not radiate Pain severity:  Moderate Onset quality:  Gradual Duration:  8 weeks Timing:  Constant Progression:  Worsening Chronicity:  New Relieved by:  Nothing Worsened by:  Nothing Ineffective treatments:  None tried Associated symptoms: diarrhea   Associated symptoms: no chest pain, no chills, no fever, no shortness of breath and no vomiting        Past Medical History:  Diagnosis Date  . Arthritis   . Blood transfusion without reported diagnosis    recevied 4 units PRBC and FFP during hospitalization from 1/17-1/22/21  . PONV (postoperative nausea and vomiting)   . Tears of meniscus and ACL of left knee 03/19/2018    Patient Active Problem List   Diagnosis Date Noted  . Psychosocial stressors 05/03/2019  . Skin lesion 04/19/2019  . Hx of  Upper GI bleed 02/11/2019  . Coagulopathy ([place]) 02/11/2019  . Alcohol abuse with unspecified alcohol-induced disorder ([place]) 10/08/2018  . Liver cirrhosis ([place]) 10/05/2018  . Thrombocytopenia ([place]) 10/05/2018  . Tears of meniscus and ACL of left knee 03/19/2018  . Asymmetrical hearing loss of right ear  03/02/2017  . Tinnitus of both ears 02/01/2017  . Hoarseness of voice 02/01/2017  . 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 CLOSED DISLOCATION OF TARSAL JOINT UNSPECIFIED 12/25/2007    Past Surgical History:  Procedure Laterality Date  . ARTHROSCOPIC REPAIR ACL     x2  . BIOPSY  10/05/2018   Procedure: BIOPSY;  Surgeon: Ronald Lobo, MD;  Location: [facility];  Service: Endoscopy;;  . ESOPHAGEAL BANDING  02/12/2019   Procedure: ESOPHAGEAL BANDING;  Surgeon: [name], MD;  Location: [facility];  Service: Gastroenterology;;  . ESOPHAGOGASTRODUODENOSCOPY (EGD) WITH PROPOFOL N/A 10/05/2018   Procedure: ESOPHAGOGASTRODUODENOSCOPY (EGD) WITH PROPOFOL;  Surgeon: Ronald Lobo, MD;  Location: [facility];  Service: Endoscopy;  Laterality: N/A;  . ESOPHAGOGASTRODUODENOSCOPY (EGD) WITH PROPOFOL N/A 02/12/2019   Procedure: ESOPHAGOGASTRODUODENOSCOPY (EGD) WITH PROPOFOL;  Surgeon: [name], MD;  Location: [facility];  Service: Gastroenterology;  Laterality: N/A;  . HERNIA REPAIR     x3  . KNEE ARTHROSCOPY WITH ANTERIOR CRUCIATE LIGAMENT (ACL) REPAIR WITH HAMSTRING GRAFT Left 03/20/2018   Procedure: KNEE ARTHROSCOPY WITH REVESION ANTERIOR CRUCIATE LIGAMENT (ACL) REPAIR WITH  HAMSTRING ALLOGRAFT,;  Surgeon: Elsie Saas, MD;  Location: [facility];  Service: Orthopedics;  Laterality: Left;  . KNEE ARTHROSCOPY WITH LATERAL MENISECTOMY Left 03/20/2018   Procedure: KNEE ARTHROSCOPY WITH LATERAL MENISECTOMY;  Surgeon: Elsie Saas, MD;  Location: [facility];  Service: Orthopedics;  Laterality: Left;  . KNEE ARTHROSCOPY WITH MEDIAL MENISECTOMY Left 03/20/2018   Procedure: KNEE ARTHROSCOPY WITH MEDIAL MENISECTOMY;  Surgeon: Elsie Saas, MD;  Location: [facility];  Service: Orthopedics;  Laterality: Left;       Family History  Problem Relation Age of Onset  .  Osteoarthritis Mother   . Coronary artery disease Father   . Hyperlipidemia Father     Social History   Tobacco Use  . Smoking status: Never Smoker  . Smokeless tobacco: Never Used  Vaping Use  . Vaping Use: Never used  Substance Use Topics  . Alcohol use: Yes    Alcohol/week: 42.0 standard drinks    Types: 42 Cans of beer per week  . Drug use: No    Home Medications Prior to Admission medications   Medication Sig Start Date End Date Taking? Authorizing Provider  buPROPion (WELLBUTRIN XL) 150 MG 24 hr tablet TAKE 1 TABLET BY MOUTH EVERY DAY 09/23/19  Yes [name], MD  clonazePAM (KLONOPIN) 1 MG tablet TAKE 1 TABLET BY MOUTH AT BEDTIME 03/26/19  Yes [name], MD  Multiple Vitamin (MULTIVITAMIN WITH MINERALS) TABS tablet Take 1 tablet by mouth daily.   Yes [provider]  propranolol (INDERAL) 10 MG tablet Take 1 tablet (10 mg total) by mouth 2 (two) times daily. 02/15/19 10/04/19 Yes Kyle, Tyrone A, DO  pantoprazole (PROTONIX) 40 MG tablet Take 1 tablet (40 mg total) by mouth daily. 02/15/19 03/17/19  Cherylann Ratel A, DO    Allergies    Patient has no known allergies.  Review of Systems   Review of Systems  Constitutional: Negative for chills and fever.  HENT: Negative for congestion and facial swelling.   Eyes: Negative for discharge and visual disturbance.  Respiratory: Negative for shortness of breath.   Cardiovascular: Negative for chest pain and palpitations.  Gastrointestinal: Positive for abdominal pain and diarrhea. Negative for vomiting.  Musculoskeletal: Negative for arthralgias and myalgias.  Skin: Negative for color change and rash.  Neurological: Negative for tremors, syncope and headaches.  Psychiatric/Behavioral: Negative for confusion and dysphoric mood.    Physical Exam Updated Vital Signs BP 121/61 (BP Location: Right Arm)   Pulse 85   Temp 98.4 F (36.9 C) (Oral)   Resp 18   Ht 5' 9"  (1.753 m)   Wt 83.9 kg   SpO2 97%   BMI 27.32  kg/m   Physical Exam Vitals and nursing note reviewed.  Constitutional:      Appearance: He is well-developed.  HENT:     Head: Normocephalic and atraumatic.  Eyes:     General: Scleral icterus (slight) present.     Pupils: Pupils are equal, round, and reactive to light.  Neck:     Vascular: No JVD.  Cardiovascular:     Rate and Rhythm: Normal rate and regular rhythm.     Heart sounds: No murmur heard.  No friction rub. No gallop.   Pulmonary:     Effort: No respiratory distress.     Breath sounds: No wheezing.  Abdominal:     General: There is distension.     Palpations: There is hepatomegaly (3 finger bredths below the costal margin).     Tenderness: There is abdominal tenderness (ruq). There is no guarding or rebound.  Musculoskeletal:        General: Normal range of motion.     Cervical back: Normal range of motion and neck supple.  Skin:    Coloration: Skin is not pale.     Findings: No rash.  Neurological:     Mental Status: He is alert and oriented to person, place, and time.  Psychiatric:        Behavior: Behavior normal.     ED Results / Procedures / Treatments   Labs (all labs ordered are listed, but only abnormal results are displayed) Labs Reviewed  CBC WITH DIFFERENTIAL/PLATELET - Abnormal; Notable for the following components:      Result Value   RBC 4.03 (*)    Hemoglobin 8.7 (*)    HCT 27.5 (*)    MCV 68.2 (*)    MCH 21.6 (*)    RDW 20.8 (*)    Platelets 121 (*)    Monocytes Absolute 1.3 (*)    All other components within normal limits  COMPREHENSIVE METABOLIC PANEL - Abnormal; Notable for the following components:   Sodium 131 (*)    Chloride 97 (*)    Glucose, Bld 103 (*)    BUN <5 (*)    Creatinine, Ser 0.52 (*)    Calcium 7.9 (*)    Albumin 2.6 (*)    AST 140 (*)    Total Bilirubin 5.2 (*)    All other components within normal limits  LIPASE, BLOOD - Abnormal; Notable for the following components:   Lipase 53 (*)    All other  components within normal limits  PROTIME-INR - Abnormal; Notable for the following components:   Prothrombin Time 20.6 (*)    INR 1.8 (*)    All other components within normal limits  SARS CORONAVIRUS 2 BY RT PCR (HOSPITAL ORDER, [facility])    EKG None  Radiology No results found.  Procedures Procedures (including critical care time)  Medications Ordered in ED Medications  sodium chloride 0.9 % bolus 1,000 mL (0 mLs Intravenous Stopped 10/04/19 0924)  ketorolac (TORADOL) 15 MG/ML injection 15 mg (15 mg Intravenous Given 10/04/19 0919)    ED Course  I have reviewed the triage vital signs and the nursing notes.  Pertinent labs & imaging results that were available during my care of the patient were reviewed by me and considered in my medical decision making (see chart for details).    MDM Rules/Calculators/A&P                          45 yo M with a significant past medical history of alcoholic cirrhosis comes in with a chief complaints of worsening abdominal distention and right upper quadrant abdominal discomfort.  He was seen in the ED yesterday but left prior to being seen.  Had mild alk phos elevation in his total bilirubin was elevated to 4.  History is most consistent with worsening cirrhosis with a 43-monthhistory of symptoms.  Will recheck abdominal labs today.  Discussed with GI.  I initially discussed the case with Dr. BAlessandra Bevels ESadie HaberGI.  He had seen the patient most recently in the hospital and they had seen him in the office.  Unfortunately he was dismissed from the GI service.  He then forwarded the request over to Dr. HBenson Norway  [facility].  The patient is declining admission.  He feels like he is well enough to continue to be treated as an outpatient.  I will have him call the GI office today.  10:52 AM:  I have discussed the diagnosis/risks/treatment options with the patient and  believe the pt to be eligible for discharge home to follow-up with PCP, GI. We also discussed returning to the ED immediately if new or worsening sx occur. We discussed the sx which are most concerning (e.g., sudden worsening pain, fever, inability to tolerate by mouth) that necessitate immediate return. Medications administered to the patient during their visit and any new prescriptions provided to the patient are listed below.  Medications given during this visit Medications  sodium chloride 0.9 % bolus 1,000 mL (0 mLs Intravenous Stopped 10/04/19 0924)  ketorolac (TORADOL) 15 MG/ML injection 15 mg (15 mg Intravenous Given 10/04/19 0919)     The patient appears reasonably screen and/or stabilized for discharge and I doubt any other medical condition or other [facility] requiring further screening, evaluation, or treatment in the ED at this time prior to discharge.   Final Clinical Impression(s) / ED Diagnoses Final diagnoses:  Alcoholic cirrhosis of liver with ascites [facility])    Rx / DC Orders ED Discharge Orders    None       Deno Etienne, DO 10/04/19 1052

## 2019-10-04 NOTE — Assessment & Plan Note (Signed)
INR and platelets reviewed. INR relatively stable, platelets slight decrease.

## 2019-10-04 NOTE — Discharge Instructions (Signed)
Follow up with GI in the office.  Their initial recommendation was for you to come in to the hospital for your worsening lab work.  If you change your mind at any time please return to the ED and we were accompanying him to the hospital.  Certainly return if your symptoms worsen or if you develop a fever or cannot keep anything down.

## 2019-10-04 NOTE — Assessment & Plan Note (Signed)
Applauded his sobriety. Continue with counseling. He has made a new will and is making some steps toward re-establishing his new life.

## 2019-10-04 NOTE — ED Notes (Signed)
ED Provider at bedside. 

## 2019-10-04 NOTE — ED Notes (Signed)
Pt did not respond when called for vitals or for treatment area

## 2019-10-04 NOTE — Progress Notes (Signed)
    CHIEF COMPLAINT / HPI: Abdominal pain Increased girth and weight Over last few days having increase in pain, sharp at times, located over RUQ. Some AM nausea. No hematemesis. Has had some diarrhea, non bloody. No dizziness Last drink was June 3 In counseling and doing well on welbutrin  PERTINENT  PMH / PSH: I have reviewed the patient's medications, allergies, past medical and surgical history, smoking status and updated in the EMR as appropriate.   OBJECTIVE:  There were no vitals taken for this visit.  GENERAL: Well-developed, well-nourished, no acute distress. CARDIOVASCULAR: Regular rate and rhythm no murmur gallop or rub LUNGS: Clear to auscultation bilaterally, no rales or wheeze. ABDOMEN: positive bowel sounds. TTP at the upperpart of epigastric area, close to sternum. No rebound or guarding. Diffusely distended. No fluid wave is elicited. No stigmata on skin NEURO: No gross focal neurological deficits. MSK: Movement of extremity x 4. PSYCH: AxOx4. Good eye contact.. No psychomotor retardation or agitation. Appropriate speech fluency and content. Asks and answers questions appropriately. Mood is congruent. SKIN no jaundice  Labs reviewed from this morning     ASSESSMENT / PLAN:   1. Subacute abdominal pain in patient with known cirrhosis. Labs seem relatively stable, Concern for new/increased symptoms. Differential very broad but includes: : hepatic ascites, hepatic inflammation, portal vein thrombosis, gall bladder disease, gastritis  Discussed options. Imaging ordered stat. I will call him once I get results Total time spent inteview of labs, exam, talking with patient, discussion of options including possible inpatient hospitalization vs outpatient management coordinating testing, scheduling 45 minutes. MDM high risk.  Dorcas Mcmurray MD

## 2019-10-04 NOTE — ED Triage Notes (Signed)
Stabbing and soreness pain to right upper quadrant yesterday.  Diarrhea for 3 days.

## 2019-10-05 ENCOUNTER — Encounter: Payer: Self-pay | Admitting: Family Medicine

## 2019-10-05 NOTE — Progress Notes (Signed)
I have left vm and text message for [name]. I will try to meet him in person today or tomorrow to discuss his CT results. I hjave set CT scan for manual release to My Chart only.

## 2019-10-06 ENCOUNTER — Other Ambulatory Visit: Payer: Self-pay | Admitting: Family Medicine

## 2019-10-06 MED ORDER — PROPRANOLOL HCL 20 MG PO TABS: 20.0000 mg | ORAL_TABLET | Freq: Three times a day (TID) | ORAL | 3 refills | Status: DC

## 2019-10-06 MED ORDER — OXYCODONE HCL 5 MG PO CAPS
5.0000 mg | ORAL_CAPSULE | Freq: Three times a day (TID) | ORAL | 0 refills | Status: DC | PRN
Start: 2019-10-06 — End: 2019-10-16

## 2019-10-06 NOTE — Progress Notes (Signed)
Long in person discussion wit [name] and his parents last night at his home re findings on CT scan. Will start beta blocker to decrease portal hypertension. Low dose pain meds. Plan to schedule oncology tomorrow. Also discussed antabuse which he asked about, but I will have to ask pharmacist (Dr. Valentina Lucks) tomorrow; looks like that can have some liver toxicity [potential so may not be an option. acamprosate may be an option.

## 2019-10-07 ENCOUNTER — Encounter: Payer: Self-pay | Admitting: Family Medicine

## 2019-10-07 ENCOUNTER — Other Ambulatory Visit: Payer: Self-pay | Admitting: Family Medicine

## 2019-10-07 MED ORDER — ACAMPROSATE CALCIUM 333 MG PO TBEC: 666.0000 mg | DELAYED_RELEASE_TABLET | Freq: Three times a day (TID) | ORAL | 2 refills | Status: DC

## 2019-10-07 MED FILL — ACAMPROSATE CALC DR 333 MG: 333 | 30 days supply | Qty: 180 | Fill #0

## 2019-10-07 NOTE — Progress Notes (Signed)
Spoke with [place] at [facility]. Referral information including nites, scans, 'scopes etc sent to GI Oncology: Fax: [phone] Phone [phone].  Discussed with [name] and he agrees wit referral to [facility] as I think they have best facility/resources for his condition.

## 2019-10-07 NOTE — Progress Notes (Signed)
Current Outpatient Medications on File Prior to Visit  Medication Sig Dispense Refill  . buPROPion (WELLBUTRIN XL) 150 MG 24 hr tablet TAKE 1 TABLET BY MOUTH EVERY DAY 90 tablet 1  . clonazePAM (KLONOPIN) 1 MG tablet TAKE 1 TABLET BY MOUTH AT BEDTIME 90 tablet 2  . Multiple Vitamin (MULTIVITAMIN WITH MINERALS) TABS tablet Take 1 tablet by mouth daily.    [name] Kitchen oxycodone (OXY-IR) 5 MG capsule Take 1 capsule (5 mg total) by mouth every 8 (eight) hours as needed for pain. 60 capsule 0  . pantoprazole (PROTONIX) 40 MG tablet Take 1 tablet (40 mg total) by mouth daily. 30 tablet 0  . propranolol (INDERAL) 20 MG tablet Take 1 tablet (20 mg total) by mouth 3 (three) times daily. 90 tablet 3   No current facility-administered medications on file prior to visit.    Patient Active Problem List   Diagnosis Date Noted  . Hx of  Upper GI bleed 02/11/2019    Priority: High  . Alcohol abuse with unspecified alcohol-induced disorder ([place]) 10/08/2018    Priority: High  . Liver cirrhosis ([place]) 10/05/2018    Priority: High  . Coagulopathy ([place]) 02/11/2019    Priority: Medium  . Thrombocytopenia ([place]) 10/05/2018    Priority: Medium  . Psychosocial stressors 05/03/2019  . Skin lesion 04/19/2019  . Tears of meniscus and ACL of left knee 03/19/2018  . Asymmetrical hearing loss of right ear 03/02/2017  . Tinnitus of both ears 02/01/2017  . Hoarseness of voice 02/01/2017  . 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 CLOSED DISLOCATION OF TARSAL JOINT UNSPECIFIED 12/25/2007   Results for Joshua Hubbard, Joshua "[name]" (MRN [id number]) as of 10/07/2019 10:24  Ref. Range 02/10/2019 21:08 02/11/2019 12:35 02/12/2019 05:45 02/12/2019 08:00 02/13/2019 03:19 02/14/2019 03:20 02/15/2019 05:49 02/22/2019 13:44 10/03/2019 12:22 10/04/2019 08:15  COMPREHENSIVE METABOLIC PANEL Unknown Rpt (A)  Rpt (A)  Rpt (A) Rpt (A) Rpt (A) Rpt (A) Rpt (A) Rpt (A)  Sodium Latest Ref  Range: 135 - 145 mmol/L 136  141  132 (L) 137 136 137 128 (L) 131 (L)  Potassium Latest Ref Range: 3.5 - 5.1 mmol/L 3.2 (L)  2.9 (L)  3.1 (L) 3.4 (L) 4.0 4.3 3.1 (L) 3.7  Chloride Latest Ref Range: 98 - 111 mmol/L 104  110  103 108 108 101 96 (L) 97 (L)  CO2 Latest Ref Range: 22 - 32 mmol/L 21 (L)  24  22 22 20  (L) 24 23 23   Glucose Latest Ref Range: 70 - 99 mg/dL 138 (H)  92  112 (H) 93 80 94 86 103 (H)  BUN Latest Ref Range: 6 - 20 mg/dL 20  16  8  <5 (L) 5 (L) 4 (L) <5 (L) <5 (L)  Creatinine Latest Ref Range: 0.61 - 1.24 mg/dL 0.77  0.87  0.72 0.66 0.78 0.63 (L) 0.64 0.52 (L)  Calcium Latest Ref Range: 8.9 - 10.3 mg/dL 7.7 (L)  7.5 (L)  7.2 (L) 7.4 (L) 7.6 (L) 8.9 8.2 (L) 7.9 (L)  Anion gap Latest Ref Range: 5 - 15  11  7  7 7 8  9 11   BUN/Creatinine Ratio Latest Ref Range: 9 - 20         6 (L)    Phosphorus Latest Ref Range: 2.5 - 4.6 mg/dL  2.3 (L)    3.2 3.3     Magnesium Latest Ref Range: 1.7 - 2.4 mg/dL  0.9 (LL)  1.3 (L) 1.7 1.6 (L) 1.5 (L)     Alkaline Phosphatase Latest Ref Range: 38 - 126 U/L 47  47  43 47 45 90 144 (H) 123  Albumin Latest Ref Range: 3.5 - 5.0 g/dL 2.6 (L)  2.4 (L)  2.3 (L) 2.3 (L) 2.3 (L) 3.5 (L) 2.4 (L) 2.6 (L)  Albumin/Globulin Ratio Latest Ref Range: 1.2 - 2.2         1.1 (L)    Lipase Latest Ref Range: 11 - 51 U/L 42        62 (H) 53 (H)  AST Latest Ref Range: 15 - 41 U/L 34  61 (H)  61 (H) 51 (H) 43 (H) 63 (H) 126 (H) 140 (H)  ALT Latest Ref Range: 0 - 44 U/L 18  21  24 22 21 28 31 31   Total Protein Latest Ref Range: 6.5 - 8.1 g/dL 6.0 (L)  5.3 (L)  5.0 (L) 5.1 (L) 5.1 (L) 6.8 7.6 7.2  Total Bilirubin Latest Ref Range: 0.3 - 1.2 mg/dL 1.9 (H)  2.9 (H)  2.6 (H) 3.3 (H) 2.9 (H) 1.3 (H) 4.0 (H) 5.2 (H)  GFR, Est Non African American Latest Ref Range: >60 mL/min >60  >60  >60 >60 >60 120 >60 >60  GFR, Est African American Latest Ref Range: >60 mL/min >60  >60  >60 >60 >60 139 >60 >60    Results for Joshua Hubbard, Joshua Hubbard" (MRN [id number]) as of 10/07/2019 10:24   Ref. Range 02/11/2019 13:21 02/11/2019 19:58 02/12/2019 05:45 02/13/2019 03:19 02/13/2019 18:28 02/14/2019 03:20 02/15/2019 05:49 02/22/2019 13:44 10/03/2019 12:22 10/04/2019 08:15  WBC Latest Ref Range: 4.0 - 10.5 K/uL 6.1 5.4 4.6 5.7  5.3 4.5 4.9 10.1 8.0  RBC Latest Ref Range: 4.22 - 5.81 MIL/uL 2.31 (L) 2.82 (L) 3.03 (L) 2.76 (L)  3.20 (L) 3.31 (L) 3.88 (L) 4.06 (L) 4.03 (L)  Hemoglobin Latest Ref Range: 13.0 - 17.0 g/dL 5.2 (LL) 7.0 (L) 7.3 (L) 6.6 (LL) 8.3 (L) 8.0 (L) 8.4 (L) 9.2 (L) 8.5 (L) 8.7 (L)  HCT Latest Ref Range: 39 - 52 % 17.1 (L) 22.0 (L) 23.3 (L) 21.5 (L) 26.5 (L) 25.8 (L) 27.7 (L) 31.8 (L) 28.1 (L) 27.5 (L)  MCV Latest Ref Range: 80.0 - 100.0 fL 74.0 (L) 78.0 (L) 76.9 (L) 77.9 (L)  80.6 83.7 82 69.2 (L) 68.2 (L)  MCH Latest Ref Range: 26.0 - 34.0 pg 22.5 (L) 24.8 (L) 24.1 (L) 23.9 (L)  25.0 (L) 25.4 (L) 23.7 (L) 20.9 (L) 21.6 (L)  MCHC Latest Ref Range: 30.0 - 36.0 g/dL 30.4 31.8 31.3 30.7  31.0 30.3 28.9 (L) 30.2 31.6  RDW Latest Ref Range: 11.5 - 15.5 % 21.6 (H) 21.7 (H) 21.0 (H) 22.3 (H)  21.7 (H) 23.2 (H) 21.3 (H) 20.6 (H) 20.8 (H)  Platelets Latest Ref Range: 150 - 400 K/uL 88 (L) 87 (L) 87 (L) 91 (L)  94 (L) 94 (L) 224 134 (L) 121 (L)  nRBC Latest Ref Range: 0.0 - 0.2 % 0.0 0.0 0.0 0.0  0.0 0.0  0.0 0.0   Results for Joshua Hubbard, Joshua Hubbard (MRN [id number]) as of 10/07/2019 10:24  Ref. Range 02/10/2019 21:20 02/11/2019 12:35 02/12/2019 05:45 02/13/2019 03:19 02/14/2019 03:20 02/15/2019 05:49 02/22/2019 13:44 10/04/2019 08:15  Prothrombin Time Latest Ref Range: 11.4 - 15.2 seconds >90.0 (H) 31.9 (H) 19.3 (H) 20.0 (H) 20.8 (H) 21.6 (H) 13.0 (H) 20.6 (H)  INR Latest Ref Range: 0.8 - 1.2  >  10.0 (HH) 3.1 (H) 1.6 (H) 1.7 (H) 1.8 (H) 1.9 (H) 1.2 1.8 (H)

## 2019-10-08 ENCOUNTER — Other Ambulatory Visit: Payer: Self-pay | Admitting: Family Medicine

## 2019-10-08 ENCOUNTER — Encounter: Payer: Self-pay | Admitting: Family Medicine

## 2019-10-08 DIAGNOSIS — K769 Liver disease, unspecified: Secondary | ICD-10-CM | POA: Insufficient documentation

## 2019-10-08 DIAGNOSIS — K7031 Alcoholic cirrhosis of liver with ascites: Secondary | ICD-10-CM

## 2019-10-08 NOTE — Progress Notes (Signed)
Scheduling paracentesis as he is increasingly uncomfortable Centralized rads scheduling -[phone]

## 2019-10-11 ENCOUNTER — Other Ambulatory Visit: Payer: Self-pay | Admitting: Family Medicine

## 2019-10-11 MED ORDER — SPIRONOLACTONE 50 MG PO TABS: ORAL_TABLET | ORAL | 3 refills | Status: DC

## 2019-10-14 ENCOUNTER — Other Ambulatory Visit: Payer: Self-pay | Admitting: Family Medicine

## 2019-10-14 DIAGNOSIS — K7031 Alcoholic cirrhosis of liver with ascites: Secondary | ICD-10-CM

## 2019-10-14 NOTE — Progress Notes (Signed)
Left message I need to check labs in next day or so since we started  spironolactone

## 2019-10-15 ENCOUNTER — Other Ambulatory Visit: Payer: Self-pay

## 2019-10-15 ENCOUNTER — Other Ambulatory Visit (HOSPITAL_COMMUNITY): Payer: 59

## 2019-10-15 ENCOUNTER — Ambulatory Visit (HOSPITAL_COMMUNITY)
Admission: RE | Admit: 2019-10-15 | Discharge: 2019-10-15 | Disposition: A | Discharge status: Home / Self Care | Payer: 59 | Source: Ambulatory Visit | Attending: Family Medicine | Admitting: Family Medicine

## 2019-10-15 DIAGNOSIS — K7031 Alcoholic cirrhosis of liver with ascites: Secondary | ICD-10-CM | POA: Diagnosis not present

## 2019-10-15 DIAGNOSIS — R188 Other ascites: Secondary | ICD-10-CM | POA: Diagnosis not present

## 2019-10-15 DIAGNOSIS — K766 Portal hypertension: Secondary | ICD-10-CM | POA: Diagnosis not present

## 2019-10-15 LAB — LACTATE DEHYDROGENASE, PLEURAL OR PERITONEAL FLUID: LD, Fluid: 37 U/L — ABNORMAL HIGH (ref 3–23)

## 2019-10-15 LAB — BODY FLUID CELL COUNT WITH DIFFERENTIAL
Lymphs, Fluid: 21 %
Monocyte-Macrophage-Serous Fluid: 75 % (ref 50–90)
Neutrophil Count, Fluid: 4 % (ref 0–25)
Total Nucleated Cell Count, Fluid: 369 cu mm (ref 0–1000)

## 2019-10-15 LAB — GLUCOSE, PLEURAL OR PERITONEAL FLUID: Glucose, Fluid: 100 mg/dL

## 2019-10-15 LAB — PROTEIN, PLEURAL OR PERITONEAL FLUID: Total protein, fluid: <3 g/dL

## 2019-10-15 IMAGING — US US PARACENTESIS
1 series · 7 of 7 positions shown · non-contrast
Comparison: none

INDICATION: Patient history of cirrhosis with portal vein thrombosis and
ascites. Request is for therapeutic and diagnostic paracentesis

[Series 1: us paracentesis · 7 of 7 slices shown]
[im 1/7]
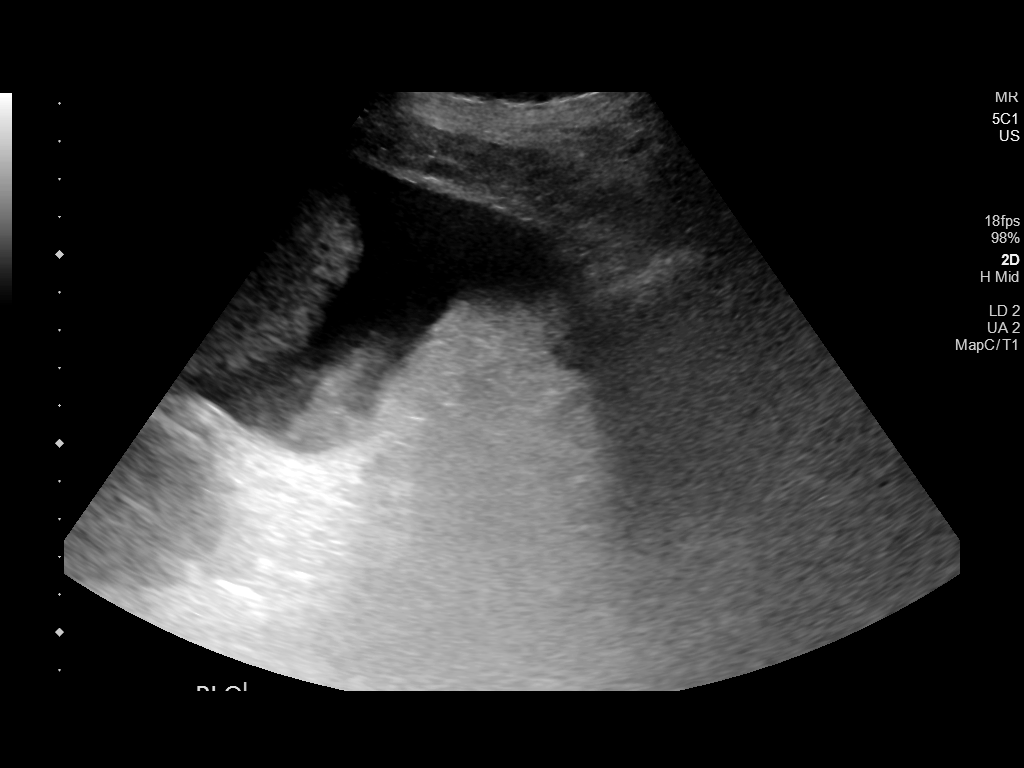
[im 2/7]
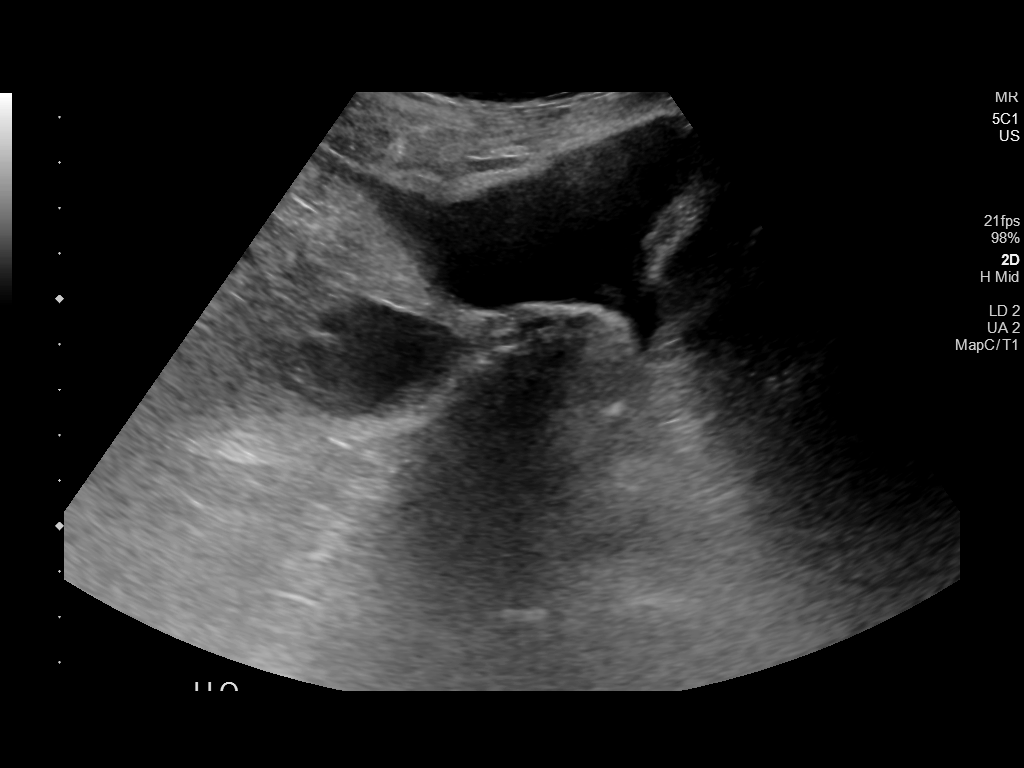
[im 3/7]
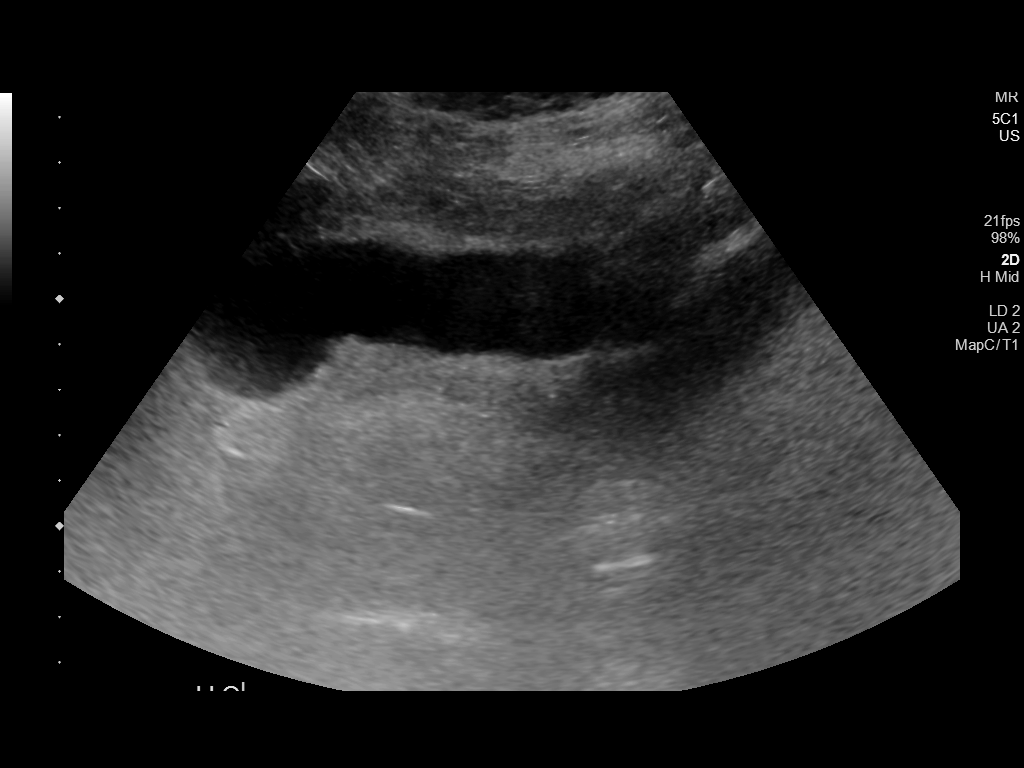
[im 4/7]
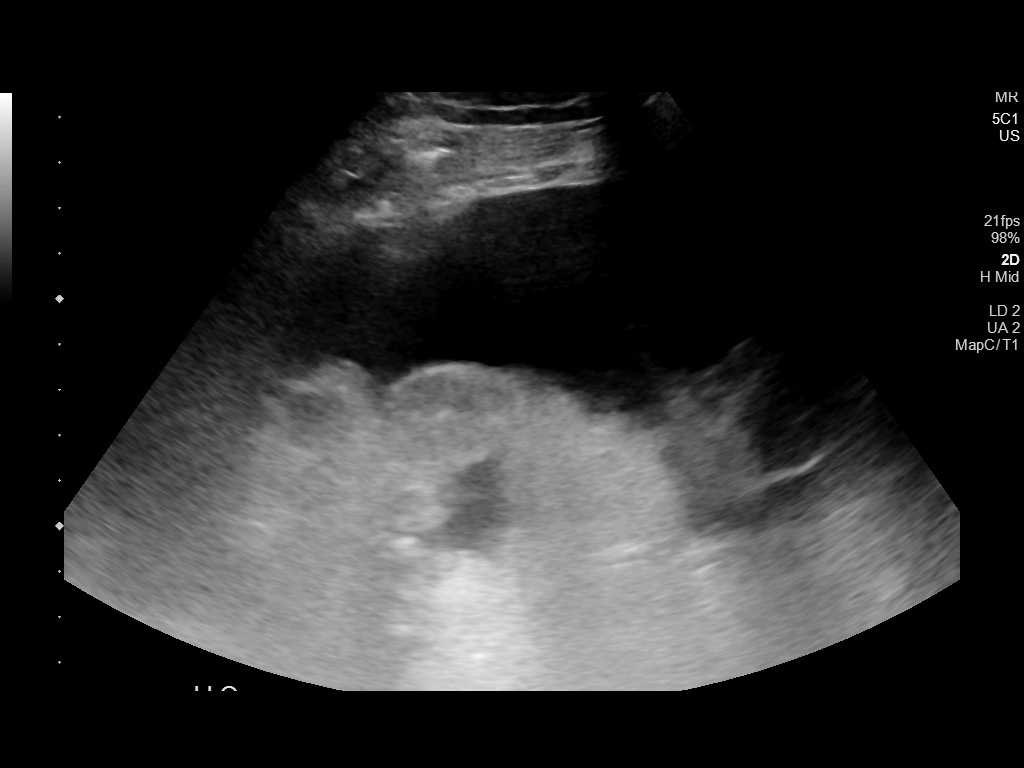
[im 5/7]
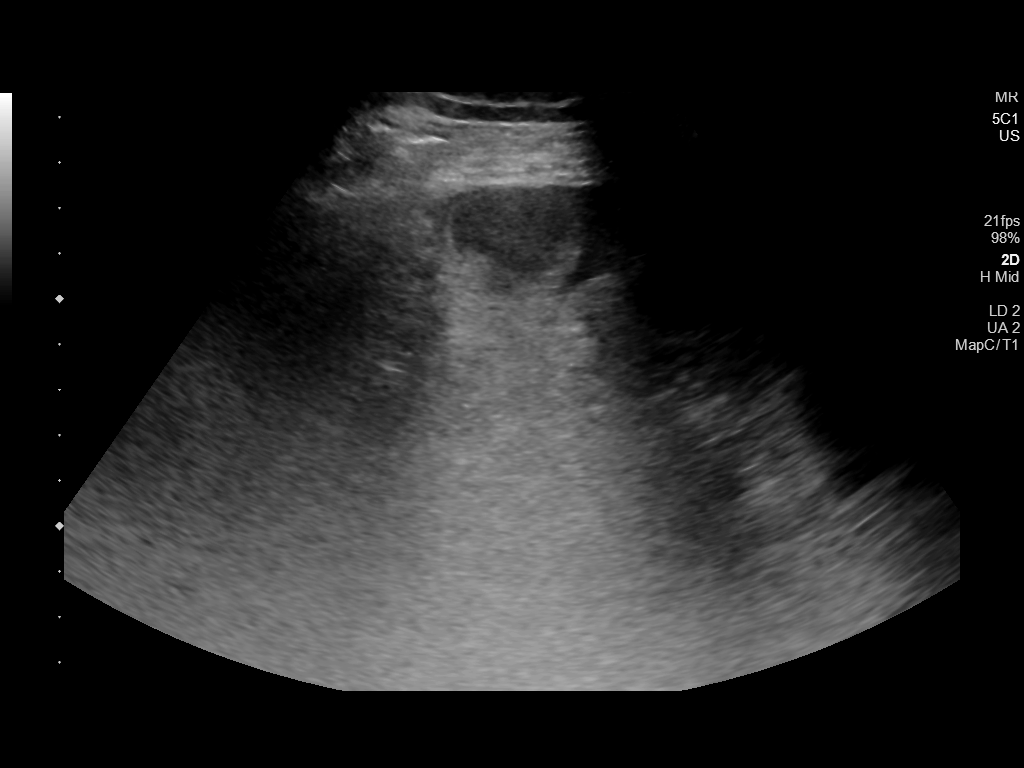
[im 6/7]
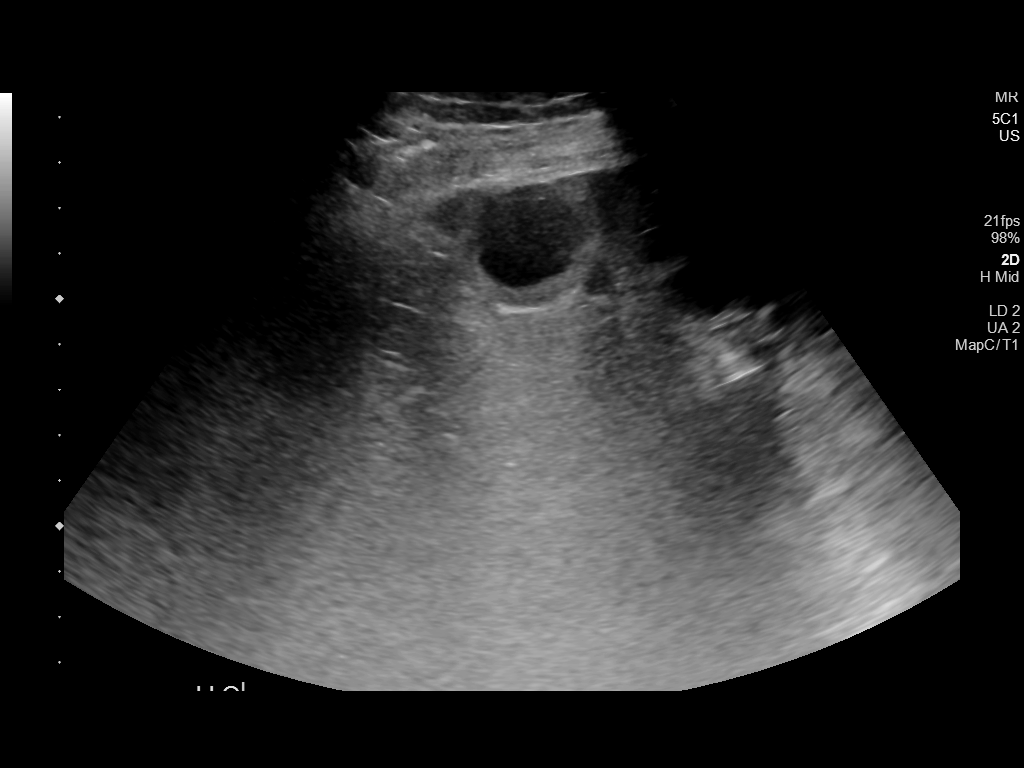
[im 7/7]
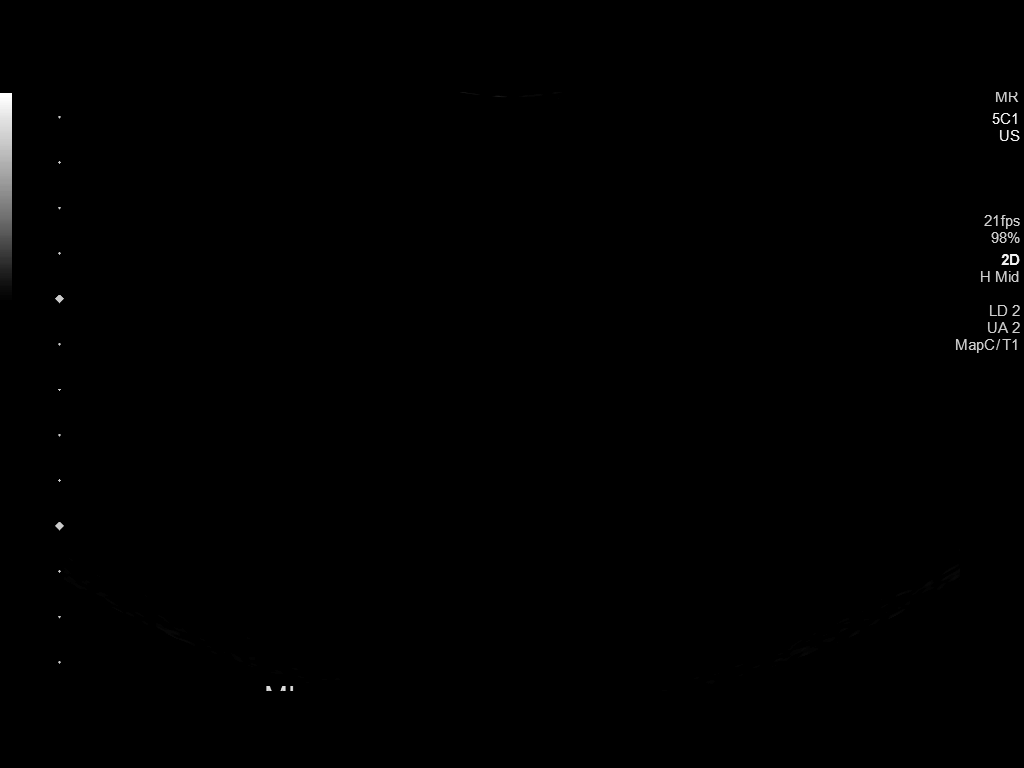

[7 of 7 positions shown; findings below may reference images not displayed]

EXAM:
ULTRASOUND GUIDED THERAPEUTIC AND DIAGNOSTIC PARACENTESIS

MEDICATIONS:
Lidocaine 1% 10 mL

COMPLICATIONS:
None immediate.

PROCEDURE:
Informed written consent was obtained from the patient after a
discussion of the risks, benefits and alternatives to treatment. A
timeout was performed prior to the initiation of the procedure.

Initial ultrasound scanning demonstrates a small amount of ascites
within the left lower abdominal quadrant. The right lower abdomen
was prepped and draped in the usual sterile fashion. 1% lidocaine
was used for local anesthesia.

Following this, a 19 gauge, 7-cm, Yueh catheter was introduced. An
ultrasound image was saved for documentation purposes. The
paracentesis was performed. The catheter was removed and a dressing
was applied. The patient tolerated the procedure well without
immediate post procedural complication.
FINDINGS: A total of approximately 1 L of straw-colored fluid was removed.
Samples were sent to the laboratory as requested by the clinical
team.
IMPRESSION: Successful ultrasound-guided therapeutic and diagnostic paracentesis
yielding 1 liters of peritoneal fluid.

Read by: KIAN SOON, NP

## 2019-10-15 MED ORDER — LIDOCAINE HCL 1 % IJ SOLN
INTRAMUSCULAR | Status: AC
  Filled 2019-10-15: qty 20

## 2019-10-15 NOTE — Procedures (Signed)
Ultrasound-guided diagnostic and therapeutic paracentesis performed yielding 1 liters of straw colored fluid.  Fluid was sent to lab for analysis. No immediate complications. EBL is none.

## 2019-10-15 NOTE — H&P (Signed)
Chief Complaint: Abdominal distention. Request is for therapeutic and diagnostic paracentesis.  Referring Physician(s): Neal,Sara L  Supervising Physician: Arne Cleveland  Patient Status: [facility] - Out-pt  History of Present Illness: Joshua Hubbard is a 44 y.o. male History of cirrhosis with abdominal distention. CT scan  from 9.11.21 shows portal hypertension with possible infiltrative HCC and ascites. Team is requesting a therapeutic and diagnostic paracentesis. Patient is scheduled to be seen at [facility] this week.  NKDA. All labs and medications are within acceptable parameters.  Past Medical History:  Diagnosis Date   Arthritis    Blood transfusion without reported diagnosis    recevied 4 units PRBC and FFP during hospitalization from 1/17-1/22/21   PONV (postoperative nausea and vomiting)    Tears of meniscus and ACL of left knee 03/19/2018    Past Surgical History:  Procedure Laterality Date   ARTHROSCOPIC REPAIR ACL     x2   BIOPSY  10/05/2018   Procedure: BIOPSY;  Surgeon: Ronald Lobo, MD;  Location: [place];  Service: Endoscopy;;   ESOPHAGEAL BANDING  02/12/2019   Procedure: ESOPHAGEAL BANDING;  Surgeon: [name], MD;  Location: [place] ENDOSCOPY;  Service: Gastroenterology;;   ESOPHAGOGASTRODUODENOSCOPY (EGD) WITH PROPOFOL N/A 10/05/2018   Procedure: ESOPHAGOGASTRODUODENOSCOPY (EGD) WITH PROPOFOL;  Surgeon: Ronald Lobo, MD;  Location: [place] ENDOSCOPY;  Service: Endoscopy;  Laterality: N/A;   ESOPHAGOGASTRODUODENOSCOPY (EGD) WITH PROPOFOL N/A 02/12/2019   Procedure: ESOPHAGOGASTRODUODENOSCOPY (EGD) WITH PROPOFOL;  Surgeon: [name], MD;  Location: [place];  Service: Gastroenterology;  Laterality: N/A;   HERNIA REPAIR     x3   KNEE ARTHROSCOPY WITH ANTERIOR CRUCIATE LIGAMENT (ACL) REPAIR WITH HAMSTRING GRAFT Left 03/20/2018   Procedure: KNEE ARTHROSCOPY WITH REVESION ANTERIOR CRUCIATE LIGAMENT (ACL) REPAIR WITH  HAMSTRING ALLOGRAFT,;   Surgeon: Elsie Saas, MD;  Location: [place];  Service: Orthopedics;  Laterality: Left;   KNEE ARTHROSCOPY WITH LATERAL MENISECTOMY Left 03/20/2018   Procedure: KNEE ARTHROSCOPY WITH LATERAL MENISECTOMY;  Surgeon: Elsie Saas, MD;  Location: [place];  Service: Orthopedics;  Laterality: Left;   KNEE ARTHROSCOPY WITH MEDIAL MENISECTOMY Left 03/20/2018   Procedure: KNEE ARTHROSCOPY WITH MEDIAL MENISECTOMY;  Surgeon: Elsie Saas, MD;  Location: [place];  Service: Orthopedics;  Laterality: Left;    Allergies: Patient has no known allergies.  Medications: Prior to Admission medications   Medication Sig Start Date End Date Taking? Authorizing Provider  acamprosate (CAMPRAL) 333 MG tablet Take 2 tablets (666 mg total) by mouth 3 (three) times daily. 10/07/19   [name], MD  buPROPion (WELLBUTRIN XL) 150 MG 24 hr tablet TAKE 1 TABLET BY MOUTH EVERY DAY 09/23/19   [name], MD  clonazePAM (KLONOPIN) 1 MG tablet TAKE 1 TABLET BY MOUTH AT BEDTIME 03/26/19   [name], MD  Multiple Vitamin (MULTIVITAMIN WITH MINERALS) TABS tablet Take 1 tablet by mouth daily.    [provider]  oxycodone (OXY-IR) 5 MG capsule Take 1 capsule (5 mg total) by mouth every 8 (eight) hours as needed for pain. 10/06/19   [name], MD  pantoprazole (PROTONIX) 40 MG tablet Take 1 tablet (40 mg total) by mouth daily. 02/15/19 03/17/19  Cherylann Ratel A, DO  propranolol (INDERAL) 20 MG tablet Take 1 tablet (20 mg total) by mouth 3 (three) times daily. 10/06/19   [name], MD  spironolactone (ALDACTONE) 50 MG tablet Take one a day for a week then start one bid 10/11/19   Nori Riis,  Marzetta Merino, MD     Family History  Problem Relation Age of Onset   Osteoarthritis Mother    Coronary artery disease Father    Hyperlipidemia Father     Social History   Socioeconomic History   Marital status: Married    Spouse name: Not on file   Number of children: Not  on file   Years of education: Not on file   Highest education level: Not on file  Occupational History   Occupation: Patent attorney with [employer]    Employer: [employer]  Tobacco Use   Smoking status: Never Smoker   Smokeless tobacco: Never Used  Vaping Use   Vaping Use: Never used  Substance and Sexual Activity   Alcohol use: Yes    Alcohol/week: 42.0 standard drinks    Types: 42 Cans of beer per week   Drug use: No   Sexual activity: Not on file  Other Topics Concern   Not on file  Social History Narrative   Not on file   Social Determinants of Health   Financial Resource Strain:    Difficulty of Paying Living Expenses: Not on file  Food Insecurity:    Worried About Festus in the Last Year: Not on file   Ran Out of Food in the Last Year: Not on file  Transportation Needs:    Lack of Transportation (Medical): Not on file   Lack of Transportation (Non-Medical): Not on file  Physical Activity:    Days of Exercise per Week: Not on file   Minutes of Exercise per Session: Not on file  Stress:    Feeling of Stress : Not on file  Social Connections:    Frequency of Communication with Friends and Family: Not on file   Frequency of Social Gatherings with Friends and Family: Not on file   Attends Religious Services: Not on file   Active Member of Clubs or Organizations: Not on file   Attends Archivist Meetings: Not on file   Marital Status: Not on file     Review of Systems: A 12 point ROS discussed and pertinent positives are indicated in the HPI above.  All other systems are negative.  Review of Systems  Constitutional: Negative for fever.  HENT: Negative for congestion.   Respiratory: Negative for cough and shortness of breath.   Cardiovascular: Negative for chest pain.  Gastrointestinal: Positive for abdominal distention and abdominal pain.       Pain worse in LUQ.   Neurological: Negative for  headaches.  Psychiatric/Behavioral: Negative for behavioral problems and confusion.    Vital Signs: There were no vitals taken for this visit.  Physical Exam Vitals and nursing note reviewed.  Constitutional:      Appearance: He is well-developed.  HENT:     Head: Normocephalic.  Pulmonary:     Effort: Pulmonary effort is normal.  Abdominal:     General: There is distension.  Musculoskeletal:        General: Normal range of motion.     Cervical back: Normal range of motion.  Skin:    General: Skin is dry.  Neurological:     Mental Status: He is alert and oriented to person, place, and time.     Imaging: CT ABDOMEN W WO CONTRAST  Result Date: 10/05/2019 CLINICAL DATA:  Abdominal pain, acute nonlocalized abdominal pain. Concern for portal thrombosis in the setting of liver disease. EXAM: CT ABDOMEN WITHOUT AND WITH CONTRAST TECHNIQUE: Multidetector  CT imaging of the abdomen was performed following the standard protocol before and following the bolus administration of intravenous contrast. CONTRAST:  153mL OMNIPAQUE IOHEXOL 300 MG/ML  SOLN COMPARISON:  02/10/2019 FINDINGS: Lower chest: Basilar atelectasis. Nodular area at the LEFT lung base (image 7, series 3) this measures approximately 10 mm. RIGHT hemidiaphragm is elevated with juxta diaphragmatic atelectasis as well. No effusion. Hepatobiliary: Liver again displays cirrhotic morphology with atrophy of the RIGHT lobe and hypertrophy of LEFT lobe and caudate. There is considerable background variation in liver density compatible with background geographic steatotic change. Geographic area of mosaic enhancement in the posterior RIGHT hepatic lobe, hepatic subsegment VII and VIII extending towards the dome of the RIGHT hemi liver measuring approximately 11 x 7 cm in greatest axial dimension. This area shows washout on the delayed phase but is not defined lesion. Hypervascular enhancement along the margin of hepatic subsegment VIII with  amorphous features persisting on later phases seen on image 22 of series 5 Heterogeneous enhancement along the posterior margin of the lateral segment of the LEFT hepatic lobe, hepatic subsegment II. This area does not show washout. The portal vein is patent. Classic hepatic arterial anatomy arises from the celiac axis. Gallbladder is collapsed with signs of cholelithiasis. Thickening and under distension likely related to portal hypertension. Pancreas: Pancreas without ductal dilation or sign of inflammation. Spleen: Spleen remains enlarged similar to the prior study. Adrenals/Urinary Tract: Adrenal glands are normal. Kidneys enhance symmetrically without for abnormality. No hydronephrosis. Stomach/Bowel: Signs of gastric and small bowel thickening as well as colonic thickening perhaps more so along the RIGHT colon than LEFT. No pneumatosis. The SMV is patent. Vascular/Lymphatic: Extensive portosystemic collaterals in the upper abdomen including esophageal varices, recanalized periumbilical collaterals mainly arising from perisplenic and pericolonic collaterals tracking over the anterior abdomen. No aneurysmal dilation of the abdominal aorta. Enlarged periportal lymph nodes often a nonspecific finding in the setting of liver disease, of uncertain significance given findings in the RIGHT posterior hemi liver described above. Largest lymph node approximately 1.8 cm about the celiac axis. No retroperitoneal adenopathy. Other: Moderate volume ascites which has increased since previous imaging. Signs of ventral abdominal wall reconstruction with similar appearance to the prior study. Musculoskeletal: No acute musculoskeletal process. Spinal degenerative changes. IMPRESSION: 1. Findings suspicious for infiltrative hepatocellular carcinoma in the RIGHT hepatic lobe LR category M. correlation with AFP and MRI may be helpful for further characterization. There are other scattered areas of hypervascular and or venous  enhancement that are seen on the current exam do not meet strict LI-RADS criteria for additional sites of neoplasm but would be better evaluated with MRI to exclude this possibility. 2. Signs of worsening of stigmata of liver disease likely associated with portal colopathy and increased ascites. There is also diffuse bowel edema likely related to portal hypertension. 3. Portal vein is patent. 4. Enlarged periportal lymph nodes often a nonspecific finding in the setting of liver disease, of uncertain significance given findings in the RIGHT posterior hemi liver described above. 5. Nodular area at the LEFT lung base measures approximately 10 mm. This may be related to atelectatic changes or scarring though given findings in the liver would consider short interval follow-up with chest CT to assess for resolution and or changes and to assess for any other findings of concern in the chest. 6. Cholelithiasis. These results will be called to the ordering clinician or representative by the Radiologist Assistant, and communication documented in the PACS or [employer]. Electronically Signed  By: Zetta Bills M.D.   On: 10/05/2019 14:54    Labs:  CBC: Recent Labs    02/15/19 0549 02/22/19 1344 10/03/19 1222 10/04/19 0815  WBC 4.5 4.9 10.1 8.0  HGB 8.4* 9.2* 8.5* 8.7*  HCT 27.7* 31.8* 28.1* 27.5*  PLT 94* 224 134* 121*    COAGS: Recent Labs    02/10/19 2120 02/11/19 1235 02/14/19 0320 02/15/19 0549 02/22/19 1344 10/04/19 0815  INR >10.0*   < > 1.8* 1.9* 1.2 1.8*  APTT >200*  --   --   --   --   --    < > = values in this interval not displayed.    BMP: Recent Labs    02/15/19 0549 02/22/19 1344 10/03/19 1222 10/04/19 0815  NA 136 137 128* 131*  K 4.0 4.3 3.1* 3.7  CL 108 101 96* 97*  CO2 20* 24 23 23   GLUCOSE 80 94 86 103*  BUN 5* 4* <5* <5*  CALCIUM 7.6* 8.9 8.2* 7.9*  CREATININE 0.78 0.63* 0.64 0.52*  GFRNONAA >60 120 >60 >60  GFRAA >60 139 >60 >60    LIVER FUNCTION  TESTS: Recent Labs    02/15/19 0549 02/22/19 1344 10/03/19 1222 10/04/19 0815  BILITOT 2.9* 1.3* 4.0* 5.2*  AST 43* 63* 126* 140*  ALT 21 28 31 31   ALKPHOS 45 90 144* 123  PROT 5.1* 6.8 7.6 7.2  ALBUMIN 2.3* 3.5* 2.4* 2.6*    Assessment and Plan:  44 y.o. male outpatient. History of cirrhosis with abdominal distention. CT scan  from 9.11.21 shows portal hypertension with possible infiltrative HCC and ascites. Team is requesting a therapeutic and diagnostic paracentesis. Patient is scheduled to be seen at [facility] this week.  NKDA. All labs and medications are within acceptable parameters.  Thank you for this interesting consult.  I greatly enjoyed meeting Rockne Dearinger Cahalan and look forward to participating in their care.  A copy of this report was sent to the requesting provider on this date.  Electronically Signed: Jacqualine Mau, NP 10/15/2019, 3:47 PM   I spent a total of  40 Minutes   in face to face in clinical consultation, greater than 50% of which was counseling/coordinating care for paracentesis

## 2019-10-16 ENCOUNTER — Encounter: Payer: Self-pay | Admitting: Family Medicine

## 2019-10-16 ENCOUNTER — Other Ambulatory Visit: Payer: Self-pay

## 2019-10-16 ENCOUNTER — Ambulatory Visit (INDEPENDENT_AMBULATORY_CARE_PROVIDER_SITE_OTHER): Payer: 59 | Admitting: Family Medicine

## 2019-10-16 VITALS — BP 100/50 | HR 73 | Ht 69.0 in | Wt 189.4 lb

## 2019-10-16 DIAGNOSIS — K7460 Unspecified cirrhosis of liver: Secondary | ICD-10-CM

## 2019-10-16 DIAGNOSIS — K769 Liver disease, unspecified: Secondary | ICD-10-CM

## 2019-10-16 DIAGNOSIS — Z9189 Other specified personal risk factors, not elsewhere classified: Secondary | ICD-10-CM | POA: Diagnosis not present

## 2019-10-16 DIAGNOSIS — R188 Other ascites: Secondary | ICD-10-CM

## 2019-10-16 LAB — CYTOLOGY - NON PAP

## 2019-10-16 LAB — TRIGLYCERIDES, BODY FLUIDS: Triglycerides, Fluid: 24 mg/dL

## 2019-10-16 MED ORDER — PROPRANOLOL HCL 20 MG PO TABS
20.0000 mg | ORAL_TABLET | Freq: Two times a day (BID) | ORAL | 3 refills | Status: DC
Start: 2019-10-16 — End: 2019-12-21

## 2019-10-16 MED ORDER — OXYCODONE HCL 5 MG PO CAPS
ORAL_CAPSULE | ORAL | 0 refills | Status: DC
Start: 2019-10-16 — End: 2019-10-17

## 2019-10-16 NOTE — Patient Instructions (Signed)
DECREASE the propranolol to twice a day. Increase the oxycodone to two tabs every 8 hours and TEXT ME In Am tomorrow if  This is a good dose.

## 2019-10-16 NOTE — Progress Notes (Signed)
    CHIEF COMPLAINT / HPI: 1.  f/u starting spironolactone: no problems. Has not had significant increase in urination 2. Had paracentesis yesterday. Took off one liter. Has not noticed any difference in discomfort of abdominal distention  3.is actually having increased flank pain. Both flanks equally, paracentesis was done on Left. 4. Started  On the acamprosate and is tolerating it well. Has not had any alcohol cravings. 5. Fatigue is worse last 2 days. He feels so tired he is having trouble getting ready for work this AM. His Mom was so worried that she made him accept a ride to clinic.  6. Has appt with GI oncologist this coming Friday morning.    PERTINENT  PMH / PSH: I have reviewed the patient's medications, allergies, past medical and surgical history, smoking status and updated in the EMR as appropriate. CT scan suspicious for hepatocellular carcinoma in setting of cirrhosis hx alcohol abuse with last drink June 27, 2019.   OBJECTIVE:  BP (!) 100/50   Pulse 73   Ht 5\' 9"  (1.753 m)   Wt 189 lb 6.4 oz (85.9 kg)   SpO2 95%   BMI 27.97 kg/m  Vital signs reviewed. GENERAL: Well-developed, well-nourished, no acute distress. Looks fatigued. CARDIOVASCULAR: Regular rate and rhythm no murmur gallop or rub LUNGS: Clear to auscultation bilaterally, no rales or wheeze. ABDOMEN: positive bowel sounds. Very distended and mildy ttp diffusely. No specififc ara of tenderness, no rebound or guarding.  NEURO: No gross focal neurological deficits. MSK: Movement of extremity x 4.    ASSESSMENT / PLAN:   Lesion of liver greater than 1 cm in diameter with liver disease conferring risk of hepatocellular carcinoma Labs to check potassium with recent start of spironolactone. If they are fine, would plan to increase to BID next few days. Decrease dose of propranolol. I think contributing  To his fatige and BP on low side today. Change from TID to BID.  Abdominal distention nit much improved  from paracentesis. Fluids looked Ok.Labs today to include albumin, INR/PT/PTT, CBC CMP.Has appt this Friday with [facility] oncology and I am sure they will want their own labs.  I will send updated notes to [facility]  Abdominal pain not controlled. Will increase pain med dose to 10 mg q 8 prn and he will call me tomorrow with update.    Dorcas Mcmurray MD

## 2019-10-16 NOTE — Assessment & Plan Note (Addendum)
Labs to check potassium with recent start of spironolactone. If they are fine, would plan to increase to BID next few days. Decrease dose of propranolol. I think contributing  To his fatige and BP on low side today. Change from TID to BID.  Abdominal distention nit much improved from paracentesis. Fluids looked Ok.Labs today to include albumin, INR/PT/PTT, CBC CMP.Has appt this Friday with [facility] oncology and I am sure they will want their own labs.  I will send updated notes to [facility]  Abdominal pain not controlled. Will increase pain med dose to 10 mg q 8 prn and he will call me tomorrow with update.

## 2019-10-17 ENCOUNTER — Other Ambulatory Visit: Payer: Self-pay | Admitting: Family Medicine

## 2019-10-17 LAB — CBC
Hematocrit: 27.7 % — ABNORMAL LOW (ref 37.5–51.0)
Hemoglobin: 8.8 g/dL — ABNORMAL LOW (ref 13.0–17.7)
MCH: 22.1 pg — ABNORMAL LOW (ref 26.6–33.0)
MCHC: 31.8 g/dL (ref 31.5–35.7)
MCV: 70 fL — ABNORMAL LOW (ref 79–97)
Platelets: 162 10*3/uL (ref 150–450)
RBC: 3.98 x10E6/uL — ABNORMAL LOW (ref 4.14–5.80)
RDW: 19.8 % — ABNORMAL HIGH (ref 11.6–15.4)
WBC: 5 10*3/uL (ref 3.4–10.8)

## 2019-10-17 LAB — COMPREHENSIVE METABOLIC PANEL
ALT: 31 IU/L (ref 0–44)
AST: 119 IU/L — ABNORMAL HIGH (ref 0–40)
Albumin/Globulin Ratio: 0.7 — ABNORMAL LOW (ref 1.2–2.2)
Albumin: 2.9 g/dL — ABNORMAL LOW (ref 4.0–5.0)
Alkaline Phosphatase: 124 IU/L — ABNORMAL HIGH (ref 44–121)
BUN/Creatinine Ratio: 6 — ABNORMAL LOW (ref 9–20)
BUN: 4 mg/dL — ABNORMAL LOW (ref 6–24)
Bilirubin Total: 4 mg/dL — ABNORMAL HIGH (ref 0.0–1.2)
CO2: 24 mmol/L (ref 20–29)
Calcium: 8.7 mg/dL (ref 8.7–10.2)
Chloride: 102 mmol/L (ref 96–106)
Creatinine, Ser: 0.64 mg/dL — ABNORMAL LOW (ref 0.76–1.27)
GFR calc Af Amer: 137 mL/min/{1.73_m2} (ref >59)
GFR calc non Af Amer: 118 mL/min/{1.73_m2} (ref >59)
Globulin, Total: 4.3 g/dL (ref 1.5–4.5)
Glucose: 101 mg/dL — ABNORMAL HIGH (ref 65–99)
Potassium: 4.5 mmol/L (ref 3.5–5.2)
Sodium: 134 mmol/L (ref 134–144)
Total Protein: 7.2 g/dL (ref 6.0–8.5)

## 2019-10-17 LAB — PROTIME-INR
INR: 1.6 — ABNORMAL HIGH (ref 0.9–1.2)
Prothrombin Time: 16.9 s — ABNORMAL HIGH (ref 9.1–12.0)

## 2019-10-17 MED ORDER — OXYCODONE HCL ER 20 MG PO T12A
EXTENDED_RELEASE_TABLET | ORAL | 0 refills | Status: DC
Start: 2019-10-17 — End: 2019-10-30

## 2019-10-17 NOTE — Progress Notes (Signed)
Pain is 8-9/10 even with increased dose. Will switch to ER and increase dose. We spoke by phone. He has oncology appt tomorrow Concerning that his pain is increasing so rapidly

## 2019-10-18 DIAGNOSIS — K7460 Unspecified cirrhosis of liver: Secondary | ICD-10-CM | POA: Diagnosis not present

## 2019-10-18 DIAGNOSIS — K766 Portal hypertension: Secondary | ICD-10-CM | POA: Diagnosis not present

## 2019-10-18 DIAGNOSIS — K7031 Alcoholic cirrhosis of liver with ascites: Secondary | ICD-10-CM | POA: Diagnosis not present

## 2019-10-18 DIAGNOSIS — C220 Liver cell carcinoma: Secondary | ICD-10-CM | POA: Diagnosis not present

## 2019-10-21 DIAGNOSIS — K766 Portal hypertension: Secondary | ICD-10-CM | POA: Diagnosis not present

## 2019-10-21 DIAGNOSIS — K7460 Unspecified cirrhosis of liver: Secondary | ICD-10-CM | POA: Diagnosis not present

## 2019-10-21 DIAGNOSIS — R918 Other nonspecific abnormal finding of lung field: Secondary | ICD-10-CM | POA: Diagnosis not present

## 2019-10-21 DIAGNOSIS — C220 Liver cell carcinoma: Secondary | ICD-10-CM | POA: Diagnosis not present

## 2019-10-30 ENCOUNTER — Other Ambulatory Visit: Payer: Self-pay | Admitting: Family Medicine

## 2019-10-30 MED ORDER — OXYCODONE HCL ER 20 MG PO T12A
EXTENDED_RELEASE_TABLET | ORAL | 0 refills | Status: DC
Start: 2019-10-30 — End: 2019-11-19

## 2019-10-31 ENCOUNTER — Telehealth: Payer: Self-pay

## 2019-10-31 NOTE — Telephone Encounter (Signed)
Patient contacted Dr. Nori Riis after not being able to pick up oxycontin from pharmacy. Called and spoke with Faith at the pharmacy. Faith states that they are working on filling the script now and will take about two hours for processing. Faith ran the prescription to verify that PA was not needed. Reports that medication went through for $0 cost to patient.    Called patient and informed of above.   FYI to PCP  Talbot Grumbling, RN

## 2019-11-01 ENCOUNTER — Other Ambulatory Visit: Payer: 59

## 2019-11-01 ENCOUNTER — Other Ambulatory Visit: Payer: Self-pay

## 2019-11-01 ENCOUNTER — Ambulatory Visit (INDEPENDENT_AMBULATORY_CARE_PROVIDER_SITE_OTHER): Payer: 59 | Admitting: Family Medicine

## 2019-11-01 VITALS — BP 124/66 | Ht 69.0 in | Wt 172.0 lb

## 2019-11-01 DIAGNOSIS — K7031 Alcoholic cirrhosis of liver with ascites: Secondary | ICD-10-CM

## 2019-11-01 DIAGNOSIS — Z658 Other specified problems related to psychosocial circumstances: Secondary | ICD-10-CM

## 2019-11-01 DIAGNOSIS — K769 Liver disease, unspecified: Secondary | ICD-10-CM | POA: Diagnosis not present

## 2019-11-01 DIAGNOSIS — F1019 Alcohol abuse with unspecified alcohol-induced disorder: Secondary | ICD-10-CM

## 2019-11-01 DIAGNOSIS — Z9189 Other specified personal risk factors, not elsewhere classified: Secondary | ICD-10-CM | POA: Diagnosis not present

## 2019-11-01 NOTE — Assessment & Plan Note (Signed)
He is very relieved that this is most likely not hepatocellular carcinoma.  They seem to think it is chronic liver injury.  Hopefully that will slightly improve.  He does have follow-up appointment with transplant hepatology in the next few weeks.  I will continue him him on spironolactone.  Given his calf cramps, I will decrease his furosemide to 20 mg q. OD and get a BMP.  We discussed low-salt diet.

## 2019-11-01 NOTE — Progress Notes (Signed)
  Joshua Hubbard - 44 y.o. male MRN [id number]  Date of birth: 1975-12-29    SUBJECTIVE:      Chief Complaint:/ HPI:  Follow-up abdominal pain from liver cirrhosis and portal hypertension.  Follow-up alcohol abuse disorder.  Quite relieved after his work-up at [facility] which showed he did not have infiltrative hepatocellular carcinoma.  He continues to have significant abdominal pain however.  It has improved to about 5 to 10% since I last saw him.  He takes the pain medicine in the evening when he gets home and then sometimes before bed.  He has been having some calf cramps over the last week.  Notably his GI oncologist had placed him on furosemide.  Still abstinent.  He has done well with the acamprosate all.  No side effects from that.  Overall stress level still quite high.  Separated from his wife.  He is getting to see his daughters more often.  He is having a lot of fatigue but still working full-time.  Has an appointment scheduled with transplant hepatologist in the next couple of weeks.     OBJECTIVE: BP 124/66   Ht 5\' 9"  (1.753 m)   Wt 172 lb (78 kg)   BMI 25.40 kg/m   Physical Exam:  Vital signs are reviewed. GENERAL: Well-developed male no acute distress CV: Regular rate and rhythm next  ABDOMEN: Large and protuberant but less tender than previously.  Most of the tenderness is still in the right upper quadrant however which is where he has his crampy pain during the day. PSYCH: AxOx4. Good eye contact.. No psychomotor retardation or agitation. Appropriate speech fluency and content. Asks and answers questions appropriately. Mood is congruent. HEENT: Slight icterus of the conjunctiva.   ASSESSMENT & PLAN:  See problem based charting & AVS for pt instructions. Alcohol abuse with unspecified alcohol-induced disorder ([place]) Continues to be abstinent.  Doing well in a camper saw.  We will continue that.  I urged him to continue with his counseling  Lesion of liver  greater than 1 cm in diameter with liver disease conferring risk of hepatocellular carcinoma He is very relieved that this is most likely not hepatocellular carcinoma.  They seem to think it is chronic liver injury.  Hopefully that will slightly improve.  He does have follow-up appointment with transplant hepatology in the next few weeks.  I will continue him him on spironolactone.  Given his calf cramps, I will decrease his furosemide to 20 mg q. OD and get a BMP.  We discussed low-salt diet.  Psychosocial stressors He is actually seems to be doing quite well.  We discussed at some length.  I will see him back approximately 4 weeks and certainly as needed.  Total visit 40 minutes face-to-face time.

## 2019-11-01 NOTE — Assessment & Plan Note (Signed)
Continues to be abstinent.  Doing well in a camper saw.  We will continue that.  I urged him to continue with his counseling

## 2019-11-01 NOTE — Assessment & Plan Note (Signed)
He is actually seems to be doing quite well.  We discussed at some length.  I will see him back approximately 4 weeks and certainly as needed.  Total visit 40 minutes face-to-face time.

## 2019-11-02 LAB — BASIC METABOLIC PANEL
BUN/Creatinine Ratio: 13 (ref 9–20)
BUN: 9 mg/dL (ref 6–24)
CO2: 25 mmol/L (ref 20–29)
Calcium: 9 mg/dL (ref 8.7–10.2)
Chloride: 88 mmol/L — ABNORMAL LOW (ref 96–106)
Creatinine, Ser: 0.67 mg/dL — ABNORMAL LOW (ref 0.76–1.27)
GFR calc Af Amer: 134 mL/min/{1.73_m2} (ref >59)
GFR calc non Af Amer: 116 mL/min/{1.73_m2} (ref >59)
Glucose: 93 mg/dL (ref 65–99)
Potassium: 3.6 mmol/L (ref 3.5–5.2)
Sodium: 124 mmol/L — ABNORMAL LOW (ref 134–144)

## 2019-11-04 NOTE — Progress Notes (Signed)
Na = 124 w K 3.6 and cr 0.67. With his report of leg cramps we had changed lasix to QOD but now I will d/c that. Watch for excess fluid intake. Recheck bmp on Friday.Sent text to him.

## 2019-11-08 ENCOUNTER — Other Ambulatory Visit: Payer: Self-pay

## 2019-11-08 ENCOUNTER — Ambulatory Visit (INDEPENDENT_AMBULATORY_CARE_PROVIDER_SITE_OTHER): Payer: 59 | Admitting: Family Medicine

## 2019-11-08 VITALS — BP 118/67 | Ht 69.0 in | Wt 172.0 lb

## 2019-11-08 DIAGNOSIS — K7031 Alcoholic cirrhosis of liver with ascites: Secondary | ICD-10-CM

## 2019-11-08 DIAGNOSIS — E871 Hypo-osmolality and hyponatremia: Secondary | ICD-10-CM | POA: Diagnosis not present

## 2019-11-08 DIAGNOSIS — F1019 Alcohol abuse with unspecified alcohol-induced disorder: Secondary | ICD-10-CM | POA: Diagnosis not present

## 2019-11-08 DIAGNOSIS — Z658 Other specified problems related to psychosocial circumstances: Secondary | ICD-10-CM

## 2019-11-08 NOTE — Assessment & Plan Note (Signed)
Recheck labs 

## 2019-11-08 NOTE — Assessment & Plan Note (Signed)
Continue current meds continueCBT

## 2019-11-08 NOTE — Assessment & Plan Note (Signed)
Congratulated on continued abstinence. Quite heavy drinking Janiuary 2021 and had one episode of subsequent intake June 3rd, abstinent since. Continue  CBT.

## 2019-11-08 NOTE — Patient Instructions (Signed)
Bicep curls Triceps Overhead press Heavy enough so numbers 10-12 are a little tough Wall squats  WALKINg daily 1-3 miles 4-5 days a week

## 2019-11-08 NOTE — Progress Notes (Signed)
  KEATON BEICHNER - 44 y.o. male MRN [id number]  Date of birth: 1975-03-08    SUBJECTIVE:      Chief Complaint:/ HPI:  F/u electrolyte abnormality, chronic cirrhosis with congestive type pain from cirrhosis.  1. Abdominal pain is improved. Was daily, multiple episodes that would 'out me in the fetal position in the floor". Now is simialr pain but 3 times a week and lasts less time. Using pain meds appropriately. 2. Alcohol abuse disorder:maintaining abstinence. Taking acamoprosate and it seems to be helping. 3. Depression and stress from divorce: stable. He seems a little improved. Has been back in to see his therapist and this is helpful/. Getting to see his kids regularly and that is very helpful 4. Labs pending     OBJECTIVE: BP 118/67   Ht 5\' 9"  (1.753 m)   Wt 172 lb (78 kg)   BMI 25.40 kg/m   Physical Exam:  Vital signs are reviewed. GEN WD WN NAD\ PSYCH: AxOx4. Good eye contact.. No psychomotor retardation or agitation. Appropriate speech fluency and content. Asks and answers questions appropriately. Mood is congruent. HEENT: very slight scleral icterus (improved). Neck no LAD, supple. ABDOMEN: protuberant and very mildy diffusely tender.  ASSESSMENT & PLAN:  See problem based charting & AVS for pt instructions. Alcohol abuse with unspecified alcohol-induced disorder ([place]) Congratulated on continued abstinence. Quite heavy drinking Janiuary 2021 and had one episode of subsequent intake June 3rd, abstinent since. Continue  CBT.  Liver cirrhosis ([place]) Encouraged that his pain is improved in its frequency.Encouraged him to do some mild strength training and gentle but regular walking. He feels like he is losing muscle mass and strength. His albumin has improved some. We discussed low salt diet, cautious fluid intake, good protein ratio. He has upcoming appt with Transplant Hepatologist at [facility].  Coontinue spironolactone. Dr. Oralia Rud had added furosemide and he started having leg  cramps several times a night. Na was 124. We have it on hold for now with repeat labs today.  Psychosocial stressors Continue current meds continueCBT  Hyponatremia Recheck labs

## 2019-11-08 NOTE — Assessment & Plan Note (Signed)
Encouraged that his pain is improved in its frequency.Encouraged him to do some mild strength training and gentle but regular walking. He feels like he is losing muscle mass and strength. His albumin has improved some. We discussed low salt diet, cautious fluid intake, good protein ratio. He has upcoming appt with Transplant Hepatologist at [facility].  Coontinue spironolactone. Dr. Oralia Rud had added furosemide and he started having leg cramps several times a night. Na was 124. We have it on hold for now with repeat labs today.

## 2019-11-11 ENCOUNTER — Other Ambulatory Visit: Payer: Self-pay

## 2019-11-11 ENCOUNTER — Other Ambulatory Visit: Payer: 59

## 2019-11-11 DIAGNOSIS — K7031 Alcoholic cirrhosis of liver with ascites: Secondary | ICD-10-CM

## 2019-11-12 ENCOUNTER — Encounter: Payer: Self-pay | Admitting: Family Medicine

## 2019-11-12 LAB — BASIC METABOLIC PANEL
BUN/Creatinine Ratio: 10 (ref 9–20)
BUN: 8 mg/dL (ref 6–24)
CO2: 26 mmol/L (ref 20–29)
Calcium: 9.4 mg/dL (ref 8.7–10.2)
Chloride: 89 mmol/L — ABNORMAL LOW (ref 96–106)
Creatinine, Ser: 0.83 mg/dL (ref 0.76–1.27)
GFR calc Af Amer: 123 mL/min/{1.73_m2} (ref >59)
GFR calc non Af Amer: 106 mL/min/{1.73_m2} (ref >59)
Glucose: 108 mg/dL — ABNORMAL HIGH (ref 65–99)
Potassium: 4.1 mmol/L (ref 3.5–5.2)
Sodium: 128 mmol/L — ABNORMAL LOW (ref 134–144)

## 2019-11-14 ENCOUNTER — Other Ambulatory Visit (HOSPITAL_COMMUNITY): Payer: Self-pay | Admitting: Internal Medicine

## 2019-11-14 MED FILL — FLUARIX QUADRIVALENT 0.5 ML: 0.5 | 1 days supply | Qty: 1 | Fill #0

## 2019-11-19 ENCOUNTER — Other Ambulatory Visit: Payer: Self-pay | Admitting: Family Medicine

## 2019-11-19 MED ORDER — OXYCODONE HCL ER 20 MG PO T12A
EXTENDED_RELEASE_TABLET | ORAL | 0 refills | Status: DC
Start: 2019-11-19 — End: 2019-12-21

## 2019-11-19 MED ORDER — OXYCODONE HCL ER 20 MG PO T12A
EXTENDED_RELEASE_TABLET | ORAL | 0 refills | Status: DC
Start: 2019-11-19 — End: 2019-11-19

## 2019-11-19 NOTE — Progress Notes (Unsigned)
RN team  I sent in a rx for some more pain meds for him and they said they couldn't fillit for some weird reason---so I called and talked to the person in the pharmacy(i forgot her name already) and we straightened it out. Now I get a text from [facility] they need some kind of prior authorization. Can you call them and see what else I need ot do to get that done for him sometime today?  THANKS! Dorcas Mcmurray

## 2019-11-19 NOTE — Progress Notes (Signed)
Phone call from [place]. Worsening pain in last few days. Out of pain meds. Pain is his typical abdominal and chest. I called in some refill on meds. We may have to adjust dosing. He will go to ED if worse and will call me this PM with update.

## 2019-11-25 DIAGNOSIS — K7031 Alcoholic cirrhosis of liver with ascites: Secondary | ICD-10-CM | POA: Diagnosis not present

## 2019-12-14 ENCOUNTER — Emergency Department (HOSPITAL_BASED_OUTPATIENT_CLINIC_OR_DEPARTMENT_OTHER): Payer: 59

## 2019-12-14 ENCOUNTER — Encounter (HOSPITAL_BASED_OUTPATIENT_CLINIC_OR_DEPARTMENT_OTHER): Payer: Self-pay | Admitting: Emergency Medicine

## 2019-12-14 ENCOUNTER — Inpatient Hospital Stay (HOSPITAL_BASED_OUTPATIENT_CLINIC_OR_DEPARTMENT_OTHER)
Admission: EM | Admit: 2019-12-14 | Discharge: 2019-12-21 | DRG: 432 | Disposition: A | Discharge status: Home / Self Care | Payer: 59 | Attending: Family Medicine | Admitting: Family Medicine

## 2019-12-14 ENCOUNTER — Other Ambulatory Visit: Payer: Self-pay

## 2019-12-14 DIAGNOSIS — Z79891 Long term (current) use of opiate analgesic: Secondary | ICD-10-CM

## 2019-12-14 DIAGNOSIS — G9341 Metabolic encephalopathy: Secondary | ICD-10-CM | POA: Diagnosis not present

## 2019-12-14 DIAGNOSIS — F1390 Sedative, hypnotic, or anxiolytic use, unspecified, uncomplicated: Secondary | ICD-10-CM | POA: Diagnosis present

## 2019-12-14 DIAGNOSIS — I10 Essential (primary) hypertension: Secondary | ICD-10-CM | POA: Diagnosis present

## 2019-12-14 DIAGNOSIS — Y92002 Bathroom of unspecified non-institutional (private) residence single-family (private) house as the place of occurrence of the external cause: Secondary | ICD-10-CM | POA: Diagnosis not present

## 2019-12-14 DIAGNOSIS — I8500 Esophageal varices without bleeding: Secondary | ICD-10-CM | POA: Diagnosis not present

## 2019-12-14 DIAGNOSIS — Z20822 Contact with and (suspected) exposure to covid-19: Secondary | ICD-10-CM | POA: Diagnosis present

## 2019-12-14 DIAGNOSIS — K7460 Unspecified cirrhosis of liver: Secondary | ICD-10-CM | POA: Diagnosis not present

## 2019-12-14 DIAGNOSIS — D696 Thrombocytopenia, unspecified: Secondary | ICD-10-CM | POA: Diagnosis present

## 2019-12-14 DIAGNOSIS — D62 Acute posthemorrhagic anemia: Secondary | ICD-10-CM | POA: Diagnosis not present

## 2019-12-14 DIAGNOSIS — E872 Acidosis: Secondary | ICD-10-CM | POA: Diagnosis present

## 2019-12-14 DIAGNOSIS — I864 Gastric varices: Secondary | ICD-10-CM | POA: Diagnosis present

## 2019-12-14 DIAGNOSIS — F1010 Alcohol abuse, uncomplicated: Secondary | ICD-10-CM | POA: Diagnosis present

## 2019-12-14 DIAGNOSIS — R739 Hyperglycemia, unspecified: Secondary | ICD-10-CM | POA: Diagnosis present

## 2019-12-14 DIAGNOSIS — M1990 Unspecified osteoarthritis, unspecified site: Secondary | ICD-10-CM | POA: Diagnosis not present

## 2019-12-14 DIAGNOSIS — H9071 Mixed conductive and sensorineural hearing loss, unilateral, right ear, with unrestricted hearing on the contralateral side: Secondary | ICD-10-CM | POA: Diagnosis present

## 2019-12-14 DIAGNOSIS — K7200 Acute and subacute hepatic failure without coma: Secondary | ICD-10-CM | POA: Diagnosis present

## 2019-12-14 DIAGNOSIS — R578 Other shock: Secondary | ICD-10-CM | POA: Diagnosis present

## 2019-12-14 DIAGNOSIS — I8511 Secondary esophageal varices with bleeding: Secondary | ICD-10-CM | POA: Diagnosis not present

## 2019-12-14 DIAGNOSIS — W1839XA Other fall on same level, initial encounter: Secondary | ICD-10-CM | POA: Diagnosis present

## 2019-12-14 DIAGNOSIS — N179 Acute kidney failure, unspecified: Secondary | ICD-10-CM | POA: Diagnosis present

## 2019-12-14 DIAGNOSIS — Z8249 Family history of ischemic heart disease and other diseases of the circulatory system: Secondary | ICD-10-CM | POA: Diagnosis not present

## 2019-12-14 DIAGNOSIS — R140 Abdominal distension (gaseous): Secondary | ICD-10-CM | POA: Diagnosis not present

## 2019-12-14 DIAGNOSIS — K7031 Alcoholic cirrhosis of liver with ascites: Secondary | ICD-10-CM | POA: Diagnosis not present

## 2019-12-14 DIAGNOSIS — K7469 Other cirrhosis of liver: Secondary | ICD-10-CM | POA: Diagnosis not present

## 2019-12-14 DIAGNOSIS — I8501 Esophageal varices with bleeding: Secondary | ICD-10-CM | POA: Diagnosis not present

## 2019-12-14 DIAGNOSIS — R1110 Vomiting, unspecified: Secondary | ICD-10-CM | POA: Diagnosis not present

## 2019-12-14 DIAGNOSIS — F32A Depression, unspecified: Secondary | ICD-10-CM | POA: Diagnosis present

## 2019-12-14 DIAGNOSIS — K449 Diaphragmatic hernia without obstruction or gangrene: Secondary | ICD-10-CM | POA: Diagnosis present

## 2019-12-14 DIAGNOSIS — D500 Iron deficiency anemia secondary to blood loss (chronic): Secondary | ICD-10-CM | POA: Diagnosis not present

## 2019-12-14 DIAGNOSIS — F419 Anxiety disorder, unspecified: Secondary | ICD-10-CM | POA: Diagnosis not present

## 2019-12-14 DIAGNOSIS — R531 Weakness: Secondary | ICD-10-CM

## 2019-12-14 DIAGNOSIS — K922 Gastrointestinal hemorrhage, unspecified: Secondary | ICD-10-CM | POA: Diagnosis present

## 2019-12-14 DIAGNOSIS — R197 Diarrhea, unspecified: Secondary | ICD-10-CM | POA: Diagnosis not present

## 2019-12-14 DIAGNOSIS — Z79899 Other long term (current) drug therapy: Secondary | ICD-10-CM

## 2019-12-14 DIAGNOSIS — I4891 Unspecified atrial fibrillation: Secondary | ICD-10-CM | POA: Diagnosis present

## 2019-12-14 DIAGNOSIS — R109 Unspecified abdominal pain: Secondary | ICD-10-CM | POA: Diagnosis not present

## 2019-12-14 DIAGNOSIS — K920 Hematemesis: Secondary | ICD-10-CM | POA: Diagnosis not present

## 2019-12-14 DIAGNOSIS — E871 Hypo-osmolality and hyponatremia: Secondary | ICD-10-CM | POA: Diagnosis not present

## 2019-12-14 DIAGNOSIS — Y901 Blood alcohol level of 20-39 mg/100 ml: Secondary | ICD-10-CM | POA: Diagnosis present

## 2019-12-14 DIAGNOSIS — K766 Portal hypertension: Secondary | ICD-10-CM | POA: Diagnosis not present

## 2019-12-14 DIAGNOSIS — E8770 Fluid overload, unspecified: Secondary | ICD-10-CM | POA: Diagnosis present

## 2019-12-14 DIAGNOSIS — K921 Melena: Secondary | ICD-10-CM | POA: Diagnosis not present

## 2019-12-14 DIAGNOSIS — R55 Syncope and collapse: Secondary | ICD-10-CM | POA: Diagnosis present

## 2019-12-14 DIAGNOSIS — R188 Other ascites: Secondary | ICD-10-CM

## 2019-12-14 DIAGNOSIS — R4182 Altered mental status, unspecified: Secondary | ICD-10-CM | POA: Diagnosis not present

## 2019-12-14 DIAGNOSIS — K7030 Alcoholic cirrhosis of liver without ascites: Secondary | ICD-10-CM | POA: Diagnosis not present

## 2019-12-14 DIAGNOSIS — D689 Coagulation defect, unspecified: Secondary | ICD-10-CM | POA: Diagnosis present

## 2019-12-14 DIAGNOSIS — E876 Hypokalemia: Secondary | ICD-10-CM | POA: Diagnosis present

## 2019-12-14 HISTORY — DX: Unspecified cirrhosis of liver: K74.60

## 2019-12-14 HISTORY — DX: Essential (primary) hypertension: I10

## 2019-12-14 LAB — CBC
HCT: 23.9 % — ABNORMAL LOW (ref 39.0–52.0)
HCT: 18.4 % — ABNORMAL LOW (ref 39.0–52.0)
Hemoglobin: 7.6 g/dL — ABNORMAL LOW (ref 13.0–17.0)
Hemoglobin: 5.9 g/dL — CL (ref 13.0–17.0)
MCH: 23.3 pg — ABNORMAL LOW (ref 26.0–34.0)
MCH: 24.1 pg — ABNORMAL LOW (ref 26.0–34.0)
MCHC: 31.8 g/dL (ref 30.0–36.0)
MCHC: 32.1 g/dL (ref 30.0–36.0)
MCV: 73.3 fL — ABNORMAL LOW (ref 80.0–100.0)
MCV: 75.1 fL — ABNORMAL LOW (ref 80.0–100.0)
Platelets: 201 10*3/uL (ref 150–400)
Platelets: 155 10*3/uL (ref 150–400)
RBC: 3.26 MIL/uL — ABNORMAL LOW (ref 4.22–5.81)
RBC: 2.45 MIL/uL — ABNORMAL LOW (ref 4.22–5.81)
RDW: 24.3 % — ABNORMAL HIGH (ref 11.5–15.5)
RDW: 24.3 % — ABNORMAL HIGH (ref 11.5–15.5)
WBC: 10.8 10*3/uL — ABNORMAL HIGH (ref 4.0–10.5)
WBC: 14.6 10*3/uL — ABNORMAL HIGH (ref 4.0–10.5)
nRBC: 0 % (ref 0.0–0.2)
nRBC: 0 % (ref 0.0–0.2)

## 2019-12-14 LAB — COMPREHENSIVE METABOLIC PANEL
ALT: 45 U/L — ABNORMAL HIGH (ref 0–44)
AST: 90 U/L — ABNORMAL HIGH (ref 15–41)
Albumin: 2.3 g/dL — ABNORMAL LOW (ref 3.5–5.0)
Alkaline Phosphatase: 72 U/L (ref 38–126)
Anion gap: 11 (ref 5–15)
BUN: 31 mg/dL — ABNORMAL HIGH (ref 6–20)
CO2: 23 mmol/L (ref 22–32)
Calcium: 8.1 mg/dL — ABNORMAL LOW (ref 8.9–10.3)
Chloride: 94 mmol/L — ABNORMAL LOW (ref 98–111)
Creatinine, Ser: 0.67 mg/dL (ref 0.61–1.24)
GFR, Estimated: >60 mL/min (ref >60)
Glucose, Bld: 164 mg/dL — ABNORMAL HIGH (ref 70–99)
Potassium: 4.3 mmol/L (ref 3.5–5.1)
Sodium: 128 mmol/L — ABNORMAL LOW (ref 135–145)
Total Bilirubin: 6.2 mg/dL — ABNORMAL HIGH (ref 0.3–1.2)
Total Protein: 6.8 g/dL (ref 6.5–8.1)

## 2019-12-14 LAB — LACTIC ACID, PLASMA
Lactic Acid, Venous: 5.7 mmol/L (ref 0.5–1.9)
Lactic Acid, Venous: 8 mmol/L (ref 0.5–1.9)

## 2019-12-14 LAB — RESP PANEL BY RT-PCR (FLU A&B, COVID) ARPGX2
Influenza A by PCR: NEGATIVE
Influenza B by PCR: NEGATIVE
SARS Coronavirus 2 by RT PCR: NEGATIVE

## 2019-12-14 LAB — MRSA PCR SCREENING: MRSA by PCR: NEGATIVE

## 2019-12-14 LAB — ETHANOL: Alcohol, Ethyl (B): 23 mg/dL — ABNORMAL HIGH (ref <10)

## 2019-12-14 LAB — PROTIME-INR
INR: 2.8 — ABNORMAL HIGH (ref 0.8–1.2)
Prothrombin Time: 28.4 seconds — ABNORMAL HIGH (ref 11.4–15.2)

## 2019-12-14 LAB — GLUCOSE, CAPILLARY
Glucose-Capillary: 126 mg/dL — ABNORMAL HIGH (ref 70–99)
Glucose-Capillary: 127 mg/dL — ABNORMAL HIGH (ref 70–99)

## 2019-12-14 LAB — AMMONIA: Ammonia: 62 umol/L — ABNORMAL HIGH (ref 9–35)

## 2019-12-14 LAB — LIPASE, BLOOD: Lipase: 49 U/L (ref 11–51)

## 2019-12-14 IMAGING — DX DG CHEST 2V
2 series · 2 of 2 positions shown · non-contrast
Comparison: [DATE]

CLINICAL DATA: Abdominal pain with vomiting

EXAM:
CHEST - 2 VIEW

[chest lat]
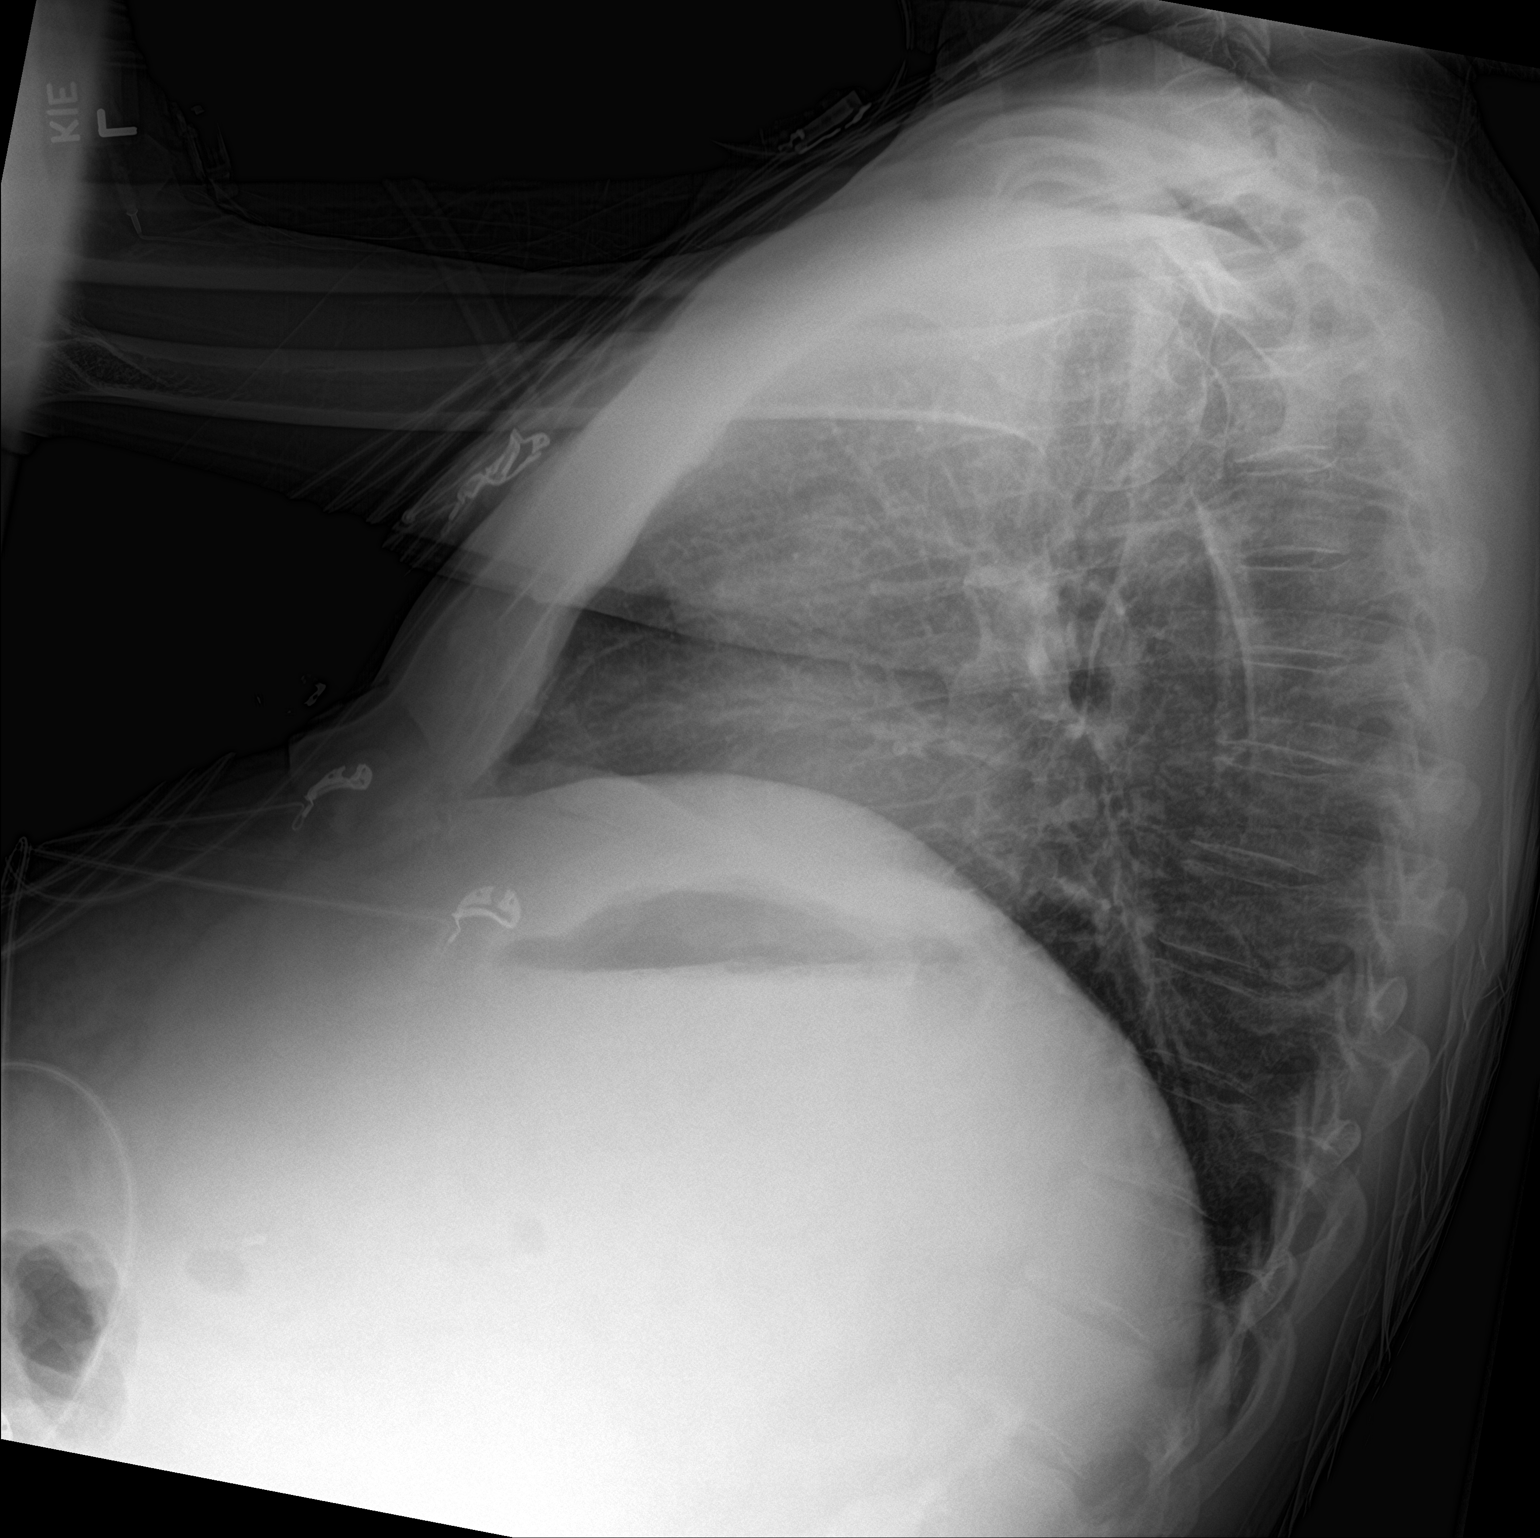

[chest ap strecther]
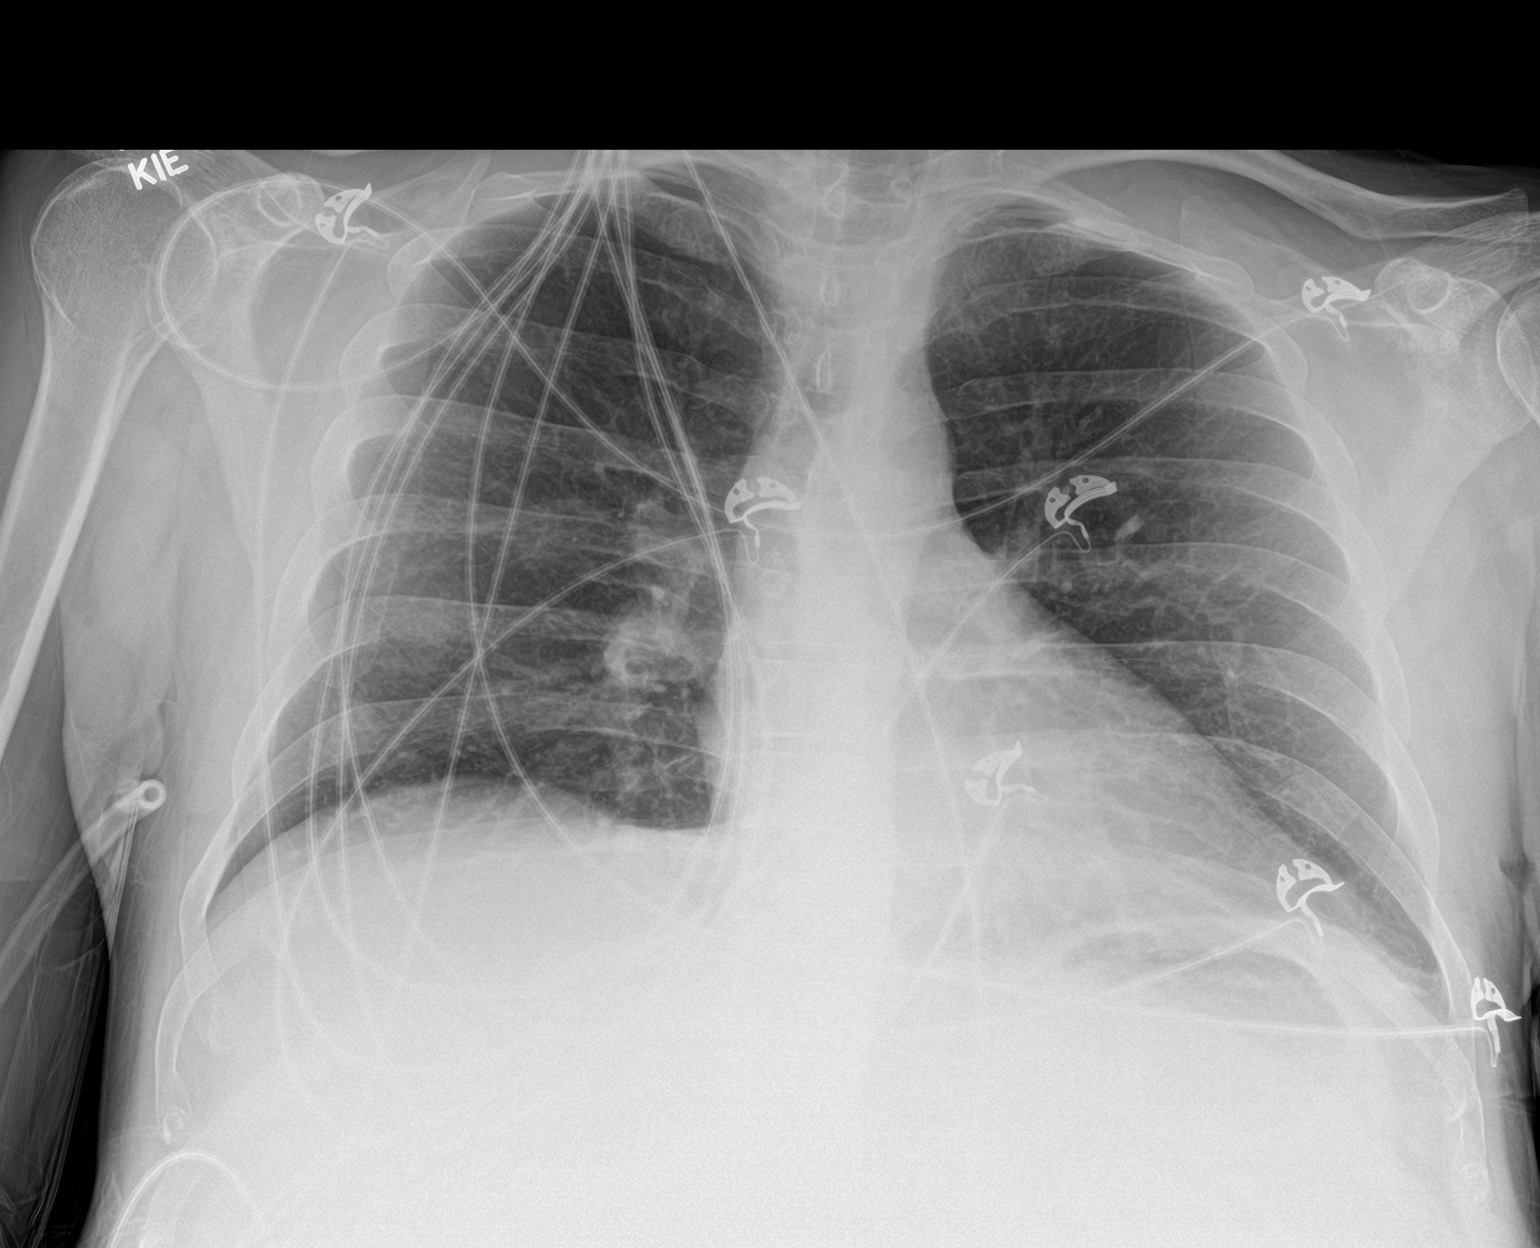

[2 of 2 positions shown; findings below may reference images not displayed]

FINDINGS: The heart size and mediastinal contours are within normal limits.
Both lungs are clear. The visualized skeletal structures are
unremarkable.
IMPRESSION: No active cardiopulmonary disease.

## 2019-12-14 IMAGING — CT CT ABD-PELV W/ CM
2 of 5 series · 16 of 46 positions shown, 18 images · IV contrast (Omnipaque)
Comparison: [DATE]

CLINICAL DATA: Abdominal distension

EXAM:
CT ABDOMEN AND PELVIS WITH CONTRAST
TECHNIQUE: Multidetector CT imaging of the abdomen and pelvis was performed
using the standard protocol following bolus administration of
intravenous contrast.
CONTRAST:  100mL OMNIPAQUE IOHEXOL 300 MG/ML  SOLN

[Series 2: axial st · axial · 0.97mm/px · z∈[-499,-19]mm · 13 of 109 slices shown, 15 images]
[im 7/109  soft-tissue]
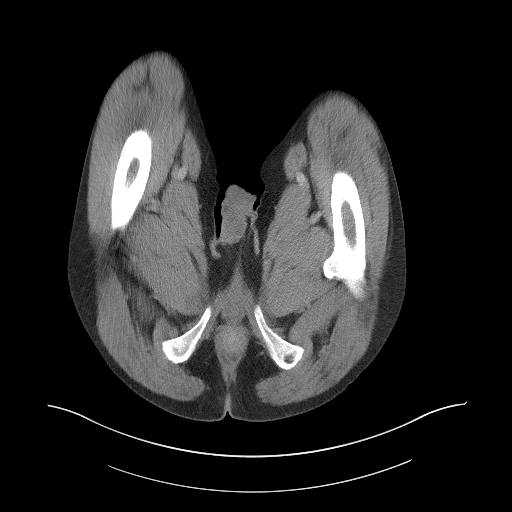
[im 7/109  bone]
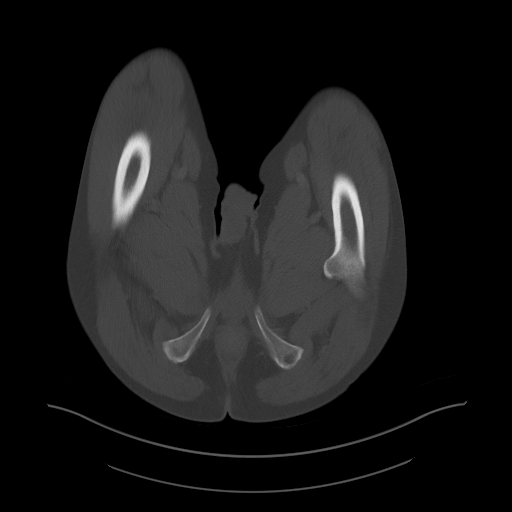
[im 13/109  soft-tissue]
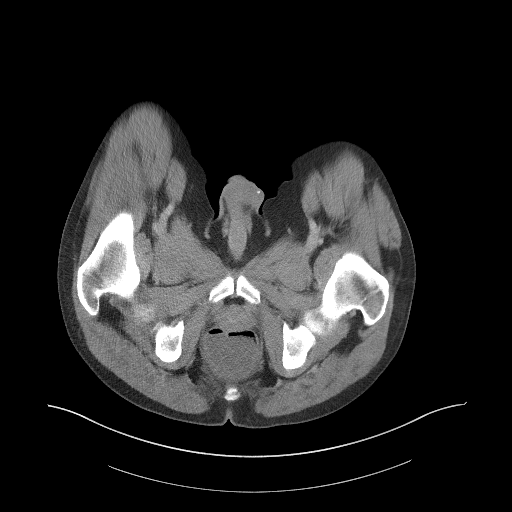
[im 25/109  soft-tissue]
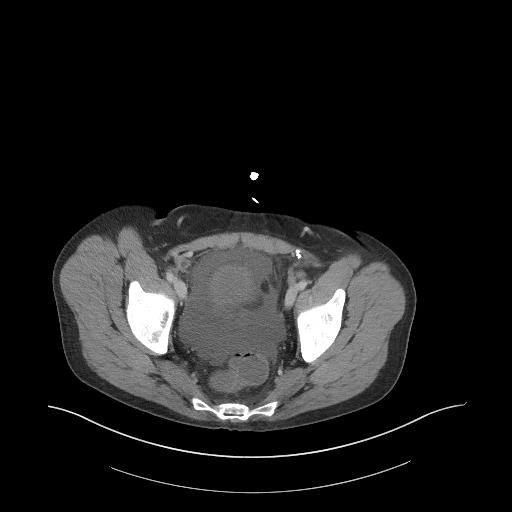
[im 31/109  soft-tissue]
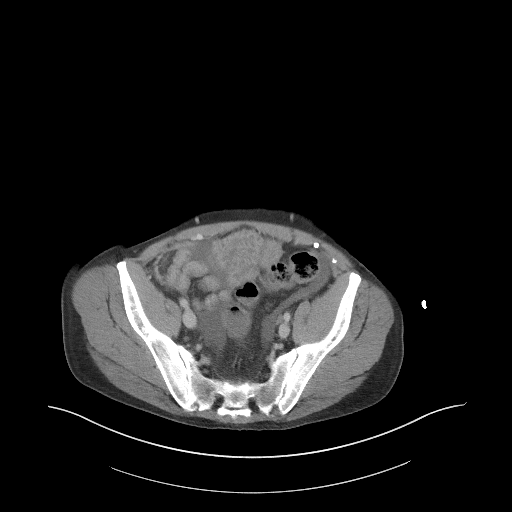
[im 37/109  soft-tissue]
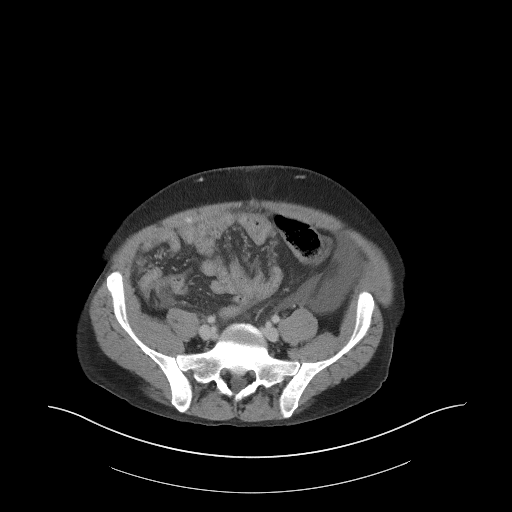
[im 49/109  soft-tissue]
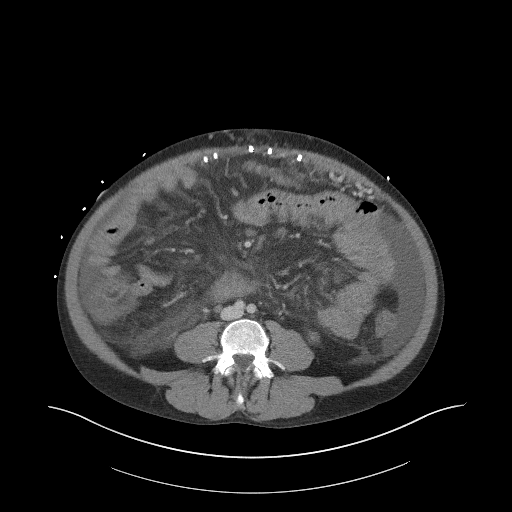
[im 55/109  soft-tissue]
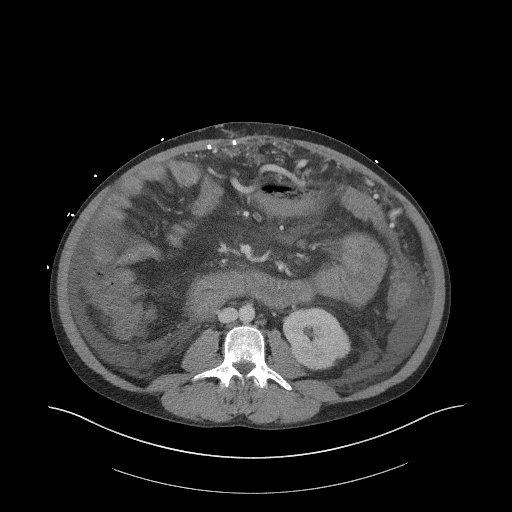
[im 61/109  soft-tissue]
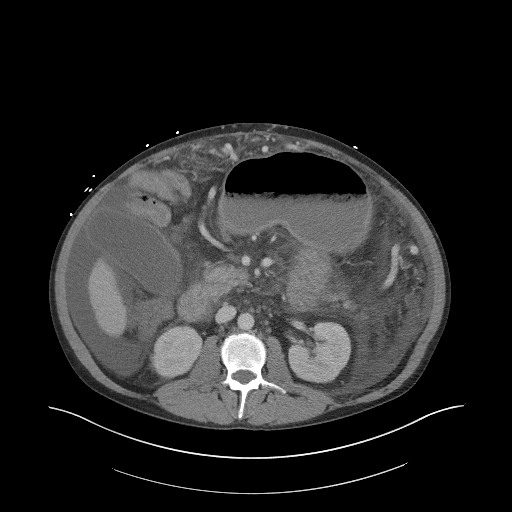
[im 73/109  soft-tissue]
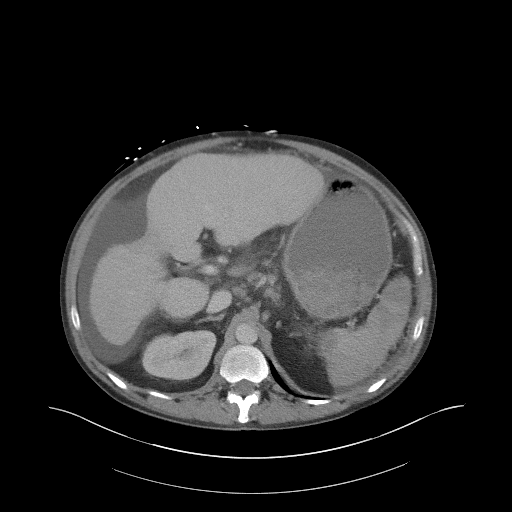
[im 73/109  bone]
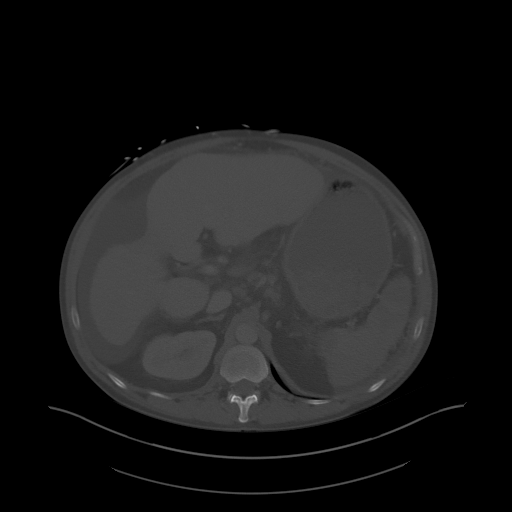
[im 79/109  soft-tissue]
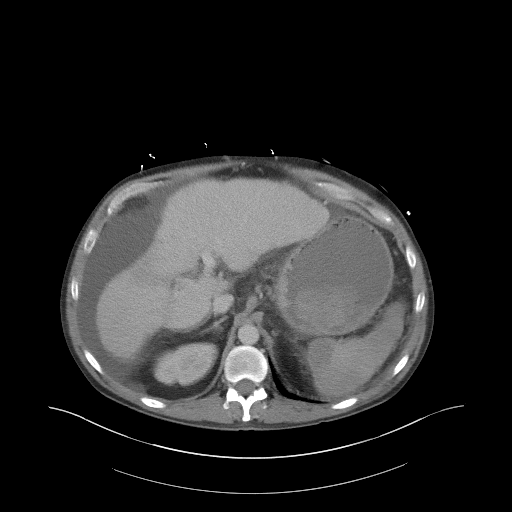
[im 85/109  soft-tissue]
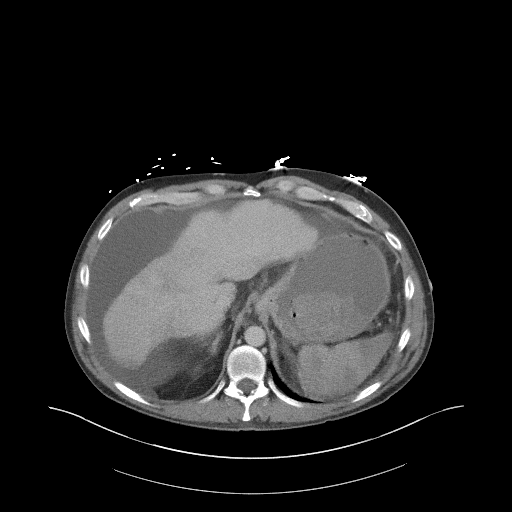
[im 97/109  soft-tissue]
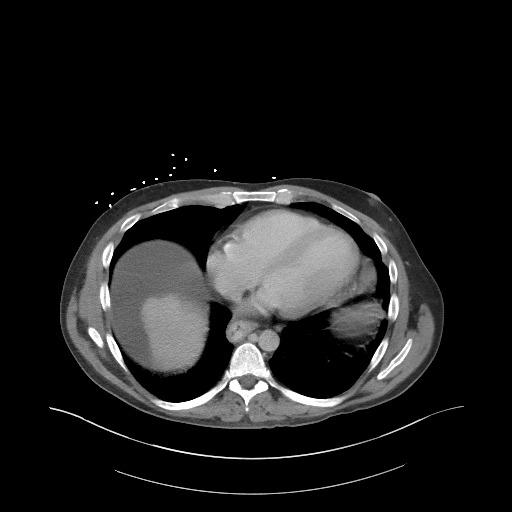
[im 103/109  soft-tissue]
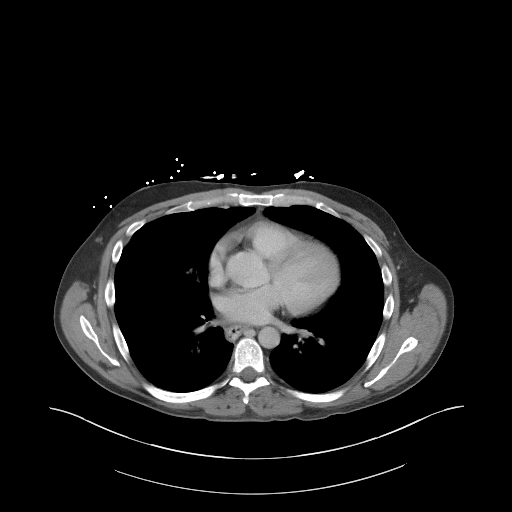

[Series 5: coronal st · coronal · 0.88mm/px · 3 of 105 slices shown]
[im 35/105  soft-tissue]
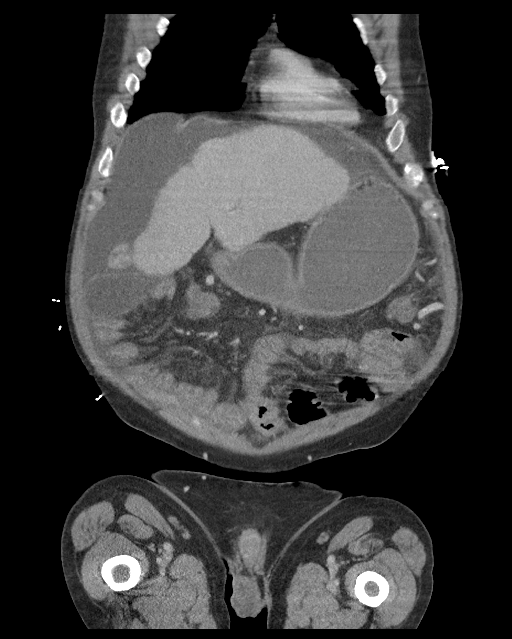
[im 47/105  soft-tissue]
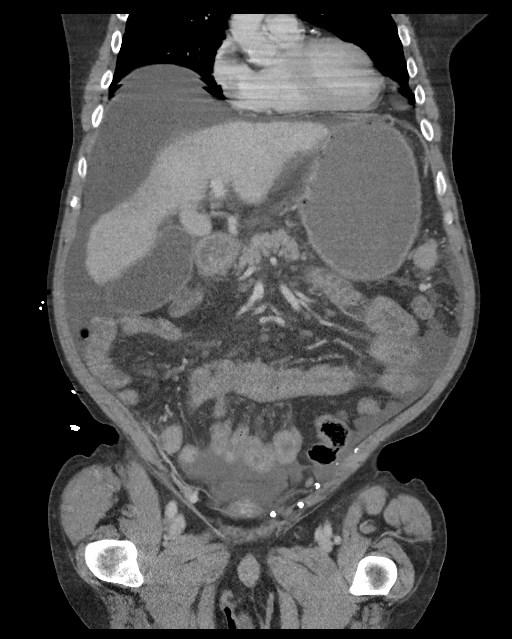
[im 58/105  soft-tissue]
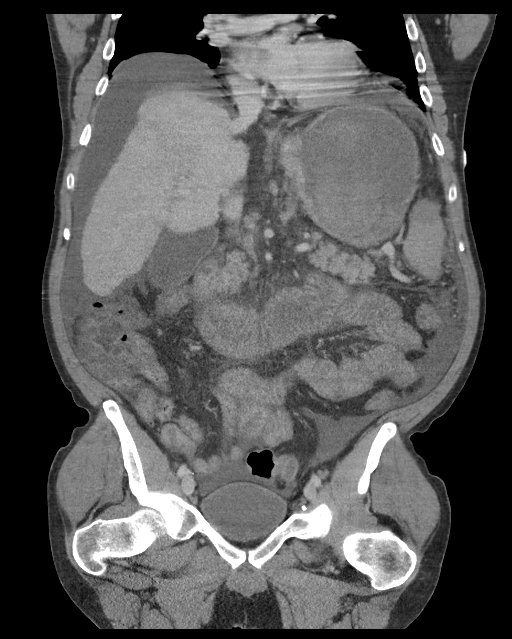

[16 of 46 positions shown; findings below may reference images not displayed]

FINDINGS: Examination is generally limited by breath motion artifact.

Lower chest: No acute abnormality. Large lower esophageal varices
(series 2, image 17).

Hepatobiliary: Cirrhotic morphology of the liver. Distended
gallbladder containing multiple small gallstones. No biliary ductal
dilatation.

Pancreas: Unremarkable. No pancreatic ductal dilatation or
surrounding inflammatory changes.

Spleen: Splenomegaly, maximum coronal span 15.9 cm.

Adrenals/Urinary Tract: Adrenal glands are unremarkable. Kidneys are
normal, without renal calculi, solid lesion, or hydronephrosis.
Bladder is unremarkable.

Stomach/Bowel: Stomach is within normal limits. Appendix appears
normal. No evidence of bowel wall thickening, distention, or
inflammatory changes.

Vascular/Lymphatic: Aortic atherosclerosis. Varices about the
ventral abdomen. No enlarged abdominal or pelvic lymph nodes.

Reproductive: No mass or other significant abnormality.

Other: No abdominal wall hernia or abnormality. Moderate volume
ascites throughout the abdomen and pelvis, increased compared to
prior examination.

Musculoskeletal: No acute or significant osseous findings.
IMPRESSION: 1. Examination is generally limited by breath motion artifact.
2. Stigmata of cirrhosis and portal hypertension, including
splenomegaly and moderate volume ascites, increased compared to
prior examination.
3. Distended gallbladder containing multiple small gallstones. No
biliary ductal dilatation.

Aortic Atherosclerosis ([1H]-[1H]).

## 2019-12-14 MED ORDER — OCTREOTIDE ACETATE 50 MCG/ML IJ SOLN
INTRAMUSCULAR | Status: AC
  Filled 2019-12-14: qty 1

## 2019-12-14 MED ORDER — IOHEXOL 300 MG/ML  SOLN
100.0000 mL | Freq: Once | INTRAMUSCULAR | Status: AC | PRN
  Administered 2019-12-14: 100 mL via INTRAVENOUS

## 2019-12-14 MED ORDER — SODIUM CHLORIDE 0.9% IV SOLUTION: Freq: Once | INTRAVENOUS | Status: DC

## 2019-12-14 MED ORDER — INSULIN ASPART 100 UNIT/ML ~~LOC~~ SOLN
0.0000 [IU] | SUBCUTANEOUS | Status: DC
  Administered 2019-12-15: 2 [IU] via SUBCUTANEOUS
  Administered 2019-12-15: 3 [IU] via SUBCUTANEOUS
  Administered 2019-12-15 – 2019-12-18 (×10): 2 [IU] via SUBCUTANEOUS

## 2019-12-14 MED ORDER — DEXTROSE-NACL 5-0.9 % IV SOLN: INTRAVENOUS | Status: DC

## 2019-12-14 MED ORDER — SODIUM CHLORIDE 0.9 % IV SOLN
2.0000 g | Freq: Once | INTRAVENOUS | Status: AC
  Administered 2019-12-14: 2 g via INTRAVENOUS
  Filled 2019-12-14: qty 2

## 2019-12-14 MED ORDER — CHLORHEXIDINE GLUCONATE CLOTH 2 % EX PADS
6.0000 | MEDICATED_PAD | Freq: Every day | CUTANEOUS | Status: DC
  Administered 2019-12-14 – 2019-12-18 (×5): 6 via TOPICAL

## 2019-12-14 MED ORDER — SODIUM CHLORIDE 0.9% IV SOLUTION: Freq: Once | INTRAVENOUS | Status: AC

## 2019-12-14 MED ORDER — PANTOPRAZOLE SODIUM 40 MG IV SOLR
INTRAVENOUS | Status: AC
  Administered 2019-12-14: 40 mg via INTRAVENOUS
  Filled 2019-12-14: qty 80

## 2019-12-14 MED ORDER — SODIUM CHLORIDE 0.9 % IV BOLUS (SEPSIS): 1000.0000 mL | Freq: Once | INTRAVENOUS | Status: DC

## 2019-12-14 MED ORDER — FENTANYL CITRATE (PF) 100 MCG/2ML IJ SOLN: 50.0000 ug | INTRAMUSCULAR | Status: DC | PRN

## 2019-12-14 MED ORDER — SODIUM CHLORIDE 0.9 % IV SOLN
80.0000 mg | Freq: Once | INTRAVENOUS | Status: AC
  Administered 2019-12-14: 80 mg via INTRAVENOUS
  Filled 2019-12-14 (×2): qty 80

## 2019-12-14 MED ORDER — SODIUM CHLORIDE 0.9 % IV SOLN
250.0000 mL | INTRAVENOUS | Status: DC
  Administered 2019-12-14 – 2019-12-15 (×3): 250 mL via INTRAVENOUS

## 2019-12-14 MED ORDER — PANTOPRAZOLE SODIUM 40 MG IV SOLR: 40.0000 mg | Freq: Two times a day (BID) | INTRAVENOUS | Status: DC

## 2019-12-14 MED ORDER — SODIUM CHLORIDE 0.9 % IV SOLN: INTRAVENOUS | Status: DC

## 2019-12-14 MED ORDER — SODIUM CHLORIDE 0.9 % IV SOLN
8.0000 mg/h | INTRAVENOUS | Status: DC
  Administered 2019-12-14 – 2019-12-15 (×2): 8 mg/h via INTRAVENOUS
  Filled 2019-12-14 (×2): qty 80

## 2019-12-14 MED ORDER — SODIUM CHLORIDE 0.9 % IV SOLN
50.0000 ug/h | INTRAVENOUS | Status: DC
  Administered 2019-12-14 – 2019-12-19 (×10): 50 ug/h via INTRAVENOUS
  Filled 2019-12-14 (×14): qty 1

## 2019-12-14 MED ORDER — PANTOPRAZOLE SODIUM 40 MG IV SOLR
INTRAVENOUS | Status: AC
  Filled 2019-12-14: qty 80

## 2019-12-14 MED ORDER — SODIUM CHLORIDE 0.9 % IV BOLUS (SEPSIS): 1000.0000 mL | Freq: Once | INTRAVENOUS | Status: AC

## 2019-12-14 MED ORDER — LORAZEPAM 2 MG/ML IJ SOLN
1.0000 mg | INTRAMUSCULAR | Status: DC | PRN
  Administered 2019-12-16: 2 mg via INTRAVENOUS
  Filled 2019-12-14: qty 1

## 2019-12-14 MED ORDER — SODIUM CHLORIDE 0.9 % IV SOLN: 1000.0000 mL | INTRAVENOUS | Status: DC

## 2019-12-14 MED ORDER — ENOXAPARIN SODIUM 40 MG/0.4ML ~~LOC~~ SOLN: 40.0000 mg | SUBCUTANEOUS | Status: DC

## 2019-12-14 MED ORDER — NOREPINEPHRINE 4 MG/250ML-% IV SOLN
2.0000 ug/min | INTRAVENOUS | Status: DC
  Filled 2019-12-14: qty 250

## 2019-12-14 MED ORDER — SODIUM CHLORIDE 0.9 % IV SOLN
INTRAVENOUS | Status: DC | PRN
  Administered 2019-12-15: 250 mL via INTRAVENOUS
  Administered 2019-12-16: 500 mL via INTRAVENOUS

## 2019-12-14 MED ORDER — LORAZEPAM 2 MG/ML IJ SOLN
1.0000 mg | Freq: Once | INTRAMUSCULAR | Status: AC
  Administered 2019-12-14: 1 mg via INTRAVENOUS
  Filled 2019-12-14: qty 1

## 2019-12-14 MED ORDER — OCTREOTIDE LOAD VIA INFUSION
50.0000 ug | Freq: Once | INTRAVENOUS | Status: AC
  Administered 2019-12-14: 50 ug via INTRAVENOUS
  Filled 2019-12-14: qty 25

## 2019-12-14 MED ORDER — NOREPINEPHRINE 4 MG/250ML-% IV SOLN
INTRAVENOUS | Status: AC
  Administered 2019-12-14: 5 ug/min via INTRAVENOUS
  Filled 2019-12-14: qty 250

## 2019-12-14 MED ORDER — ONDANSETRON HCL 4 MG/2ML IJ SOLN
4.0000 mg | Freq: Once | INTRAMUSCULAR | Status: AC
  Administered 2019-12-14: 4 mg via INTRAVENOUS
  Filled 2019-12-14: qty 2

## 2019-12-14 NOTE — ED Notes (Signed)
Attempted to give urine sample while going to restroom with assistance, stated" he couldn't pee", given urinal for use in room. Will try again later

## 2019-12-14 NOTE — ED Notes (Signed)
Attempted to call report; this RN contact info provided for callback 

## 2019-12-14 NOTE — ED Triage Notes (Addendum)
N/V/D that started yesterday. States he had a syncopal episode and fell into the wall. C/o pain to his mouth. Pt appears jaundice.

## 2019-12-14 NOTE — ED Provider Notes (Signed)
[facility] EMERGENCY DEPARTMENT Provider Note   CSN: [id number] Arrival date & time: 12/14/19  5852     History Chief Complaint  Patient presents with  . Diarrhea  . Emesis    Joshua Hubbard is a 44 y.o. male with history of known alcoholic cirrhosis, treated at [facility].  Patient presents today with 24 hours of nausea, vomiting, and diarrhea, with additional episode of syncope last night.  He states he had 4 episodes of nonbloody nonbilious emesis, and several episodes of nonbloody diarrhea.  He states his feces were very dark in color x 24 hours, which is a abnormal for him.  Patient states he was sitting in the toilet after having another episode of diarrhea, when he stood up he felt very lightheaded and then he passed out, hitting his face on the bathroom door.  He reports bleeding from his bottom lip, pain in his teeth since that time.  He endorses nausea at this time, denies dizziness, lightheadedness when lying flat.  Endorses lightheadedness with change of position.  I personally reviewed this patient's medical records.  He has history of upper GI bleed which he says is secondary to varices in January/2021.  He also has history of alcohol abuse, he achieved sobriety in January of this year upon diagnosis of his cirrhosis.  History of depression, hypertension. He denies recent alcohol use.   HPI     Past Medical History:  Diagnosis Date  . Arthritis   . Blood transfusion without reported diagnosis    recevied 4 units PRBC and FFP during hospitalization from 1/17-1/22/21  . Cirrhosis ([place])   . Hypertension   . PONV (postoperative nausea and vomiting)   . Tears of meniscus and ACL of left knee 03/19/2018    Patient Active Problem List   Diagnosis Date Noted  . Syncope 12/14/2019  . Decompensated liver disease ([place]) 12/14/2019  . Hyponatremia 11/08/2019  . Lesion of liver greater than 1 cm in diameter with liver disease conferring risk of hepatocellular  carcinoma 10/08/2019  . Psychosocial stressors 05/03/2019  . Hx of  Upper GI bleed 02/11/2019  . Coagulopathy ([place]) 02/11/2019  . Alcohol abuse with unspecified alcohol-induced disorder ([place]) 10/08/2018  . Liver cirrhosis ([place]) 10/05/2018  . Thrombocytopenia ([place]) 10/05/2018  . Tears of meniscus and ACL of left knee 03/19/2018  . Asymmetrical hearing loss of right ear 03/02/2017  . Tinnitus of both ears 02/01/2017  . Hoarseness of voice 02/01/2017  . 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 CLOSED DISLOCATION OF TARSAL JOINT UNSPECIFIED 12/25/2007    Past Surgical History:  Procedure Laterality Date  . ARTHROSCOPIC REPAIR ACL     x2  . BIOPSY  10/05/2018   Procedure: BIOPSY;  Surgeon: Ronald Lobo, MD;  Location: [place];  Service: Endoscopy;;  . ESOPHAGEAL BANDING  02/12/2019   Procedure: ESOPHAGEAL BANDING;  Surgeon: [name], MD;  Location: [place];  Service: Gastroenterology;;  . ESOPHAGOGASTRODUODENOSCOPY (EGD) WITH PROPOFOL N/A 10/05/2018   Procedure: ESOPHAGOGASTRODUODENOSCOPY (EGD) WITH PROPOFOL;  Surgeon: Ronald Lobo, MD;  Location: [place];  Service: Endoscopy;  Laterality: N/A;  . ESOPHAGOGASTRODUODENOSCOPY (EGD) WITH PROPOFOL N/A 02/12/2019   Procedure: ESOPHAGOGASTRODUODENOSCOPY (EGD) WITH PROPOFOL;  Surgeon: [name], MD;  Location: [facility];  Service: Gastroenterology;  Laterality: N/A;  . HERNIA REPAIR     x3  . KNEE ARTHROSCOPY WITH ANTERIOR CRUCIATE LIGAMENT (ACL) REPAIR WITH HAMSTRING GRAFT Left 03/20/2018   Procedure:  KNEE ARTHROSCOPY WITH REVESION ANTERIOR CRUCIATE LIGAMENT (ACL) REPAIR WITH  HAMSTRING ALLOGRAFT,;  Surgeon: Elsie Saas, MD;  Location: [place];  Service: Orthopedics;  Laterality: Left;  . KNEE ARTHROSCOPY WITH LATERAL MENISECTOMY Left 03/20/2018   Procedure: KNEE ARTHROSCOPY WITH LATERAL MENISECTOMY;  Surgeon: Elsie Saas, MD;  Location: [place];  Service: Orthopedics;  Laterality: Left;  . KNEE ARTHROSCOPY WITH MEDIAL MENISECTOMY Left 03/20/2018   Procedure: KNEE ARTHROSCOPY WITH MEDIAL MENISECTOMY;  Surgeon: Elsie Saas, MD;  Location: [place];  Service: Orthopedics;  Laterality: Left;       Family History  Problem Relation Age of Onset  . Osteoarthritis Mother   . Coronary artery disease Father   . Hyperlipidemia Father     Social History   Tobacco Use  . Smoking status: Never Smoker  . Smokeless tobacco: Never Used  Vaping Use  . Vaping Use: Never used  Substance Use Topics  . Alcohol use: Yes    Alcohol/week: 42.0 standard drinks    Types: 42 Cans of beer per week  . Drug use: No    Home Medications Prior to Admission medications   Medication Sig Start Date End Date Taking? Authorizing Provider  acamprosate (CAMPRAL) 333 MG tablet Take 2 tablets (666 mg total) by mouth 3 (three) times daily. 10/07/19   [name], MD  buPROPion (WELLBUTRIN XL) 150 MG 24 hr tablet TAKE 1 TABLET BY MOUTH EVERY DAY 09/23/19   [name], MD  clonazePAM (KLONOPIN) 1 MG tablet TAKE 1 TABLET BY MOUTH AT BEDTIME 03/26/19   [name], MD  Multiple Vitamin (MULTIVITAMIN WITH MINERALS) TABS tablet Take 1 tablet by mouth daily.    [provider]  oxyCODONE (OXYCONTIN) 20 mg 12 hr tablet Take one or two by mouth every 12 hours needed for pain 11/19/19   [name], MD  pantoprazole (PROTONIX) 40 MG tablet Take 1 tablet (40 mg total) by mouth daily. 02/15/19 03/17/19  Cherylann Ratel A, DO  propranolol (INDERAL) 20 MG tablet Take 1 tablet (20 mg total) by mouth 2 (two) times daily. 10/16/19   [name], MD  spironolactone (ALDACTONE) 50 MG tablet Take one a day for a week then start one bid 10/11/19   [name], MD    Allergies    Patient has no known allergies.  Review of Systems   Review of Systems  Constitutional: Positive for activity change and  fatigue. Negative for appetite change, chills, diaphoresis and fever.  HENT: Positive for dental problem.        Pain in front teeth secondary to fall during syncopal episode last night.  Eyes: Negative.        Yellowness in his eyes which is new in the last 48 hours.  Respiratory: Negative for apnea, chest tightness, shortness of breath and wheezing.   Cardiovascular: Negative for chest pain, palpitations and leg swelling.  Gastrointestinal: Positive for diarrhea, nausea and vomiting. Negative for abdominal pain.       Endorses very dark stool.  Endocrine: Negative.   Genitourinary: Negative for dysuria, hematuria and urgency.  Musculoskeletal: Negative.   Skin: Positive for color change.  Allergic/Immunologic: Negative.   Neurological: Positive for syncope, weakness and light-headedness. Negative for dizziness, tremors, seizures, facial asymmetry, speech difficulty, numbness and headaches.  Psychiatric/Behavioral: The patient is nervous/anxious.     Physical Exam Updated Vital Signs BP (!) 123/56 (BP Location: Right Arm)   Pulse 95  Temp 99 F (37.2 C) (Oral)   Resp 20   Ht 5\' 9"  (1.753 m)   Wt 74.8 kg   SpO2 100%   BMI 24.37 kg/m   Physical Exam Vitals and nursing note reviewed.  Constitutional:      Appearance: He is ill-appearing.  HENT:     Head: Normocephalic and atraumatic.     Nose: Nose normal.     Mouth/Throat:     Mouth: Mucous membranes are moist.     Pharynx: Oropharynx is clear. No oropharyngeal exudate or posterior oropharyngeal erythema.   Eyes:     General: Scleral icterus present.        Right eye: No discharge.        Left eye: No discharge.     Extraocular Movements: Extraocular movements intact.     Pupils: Pupils are equal, round, and reactive to light.  Neck:     Trachea: Trachea and phonation normal.  Cardiovascular:     Rate and Rhythm: Normal rate and regular rhythm.     Pulses: Normal pulses.          Radial pulses are 2+ on the right  side and 2+ on the left side.       Dorsalis pedis pulses are 2+ on the right side and 2+ on the left side.     Heart sounds: Normal heart sounds. No murmur heard.   Pulmonary:     Effort: Pulmonary effort is normal. No respiratory distress.     Breath sounds: Normal breath sounds. No wheezing or rales.  Abdominal:     General: Bowel sounds are normal. There is distension.     Palpations: Abdomen is soft. There is fluid wave. There is no mass.     Tenderness: There is no abdominal tenderness. There is no guarding or rebound.  Musculoskeletal:        General: No deformity.     Cervical back: Neck supple.     Right lower leg: No edema.     Left lower leg: No edema.  Lymphadenopathy:     Cervical: No cervical adenopathy.  Skin:    General: Skin is warm and dry.     Capillary Refill: Capillary refill takes less than 2 seconds.     Comments: Appears mildly jaundiced, however patient states that this is his normal skin tone.   Neurological:     General: No focal deficit present.     Mental Status: He is alert and oriented to person, place, and time. Mental status is at baseline.     Cranial Nerves: Cranial nerves are intact.     Sensory: Sensation is intact.     Motor: Motor function is intact.  Psychiatric:        Mood and Affect: Mood normal.     ED Results / Procedures / Treatments   Labs (all labs ordered are listed, but only abnormal results are displayed) Labs Reviewed  COMPREHENSIVE METABOLIC PANEL - Abnormal; Notable for the following components:      Result Value   Sodium 128 (*)    Chloride 94 (*)    Glucose, Bld 164 (*)    BUN 31 (*)    Calcium 8.1 (*)    Albumin 2.3 (*)    AST 90 (*)    ALT 45 (*)    Total Bilirubin 6.2 (*)    All other components within normal limits  CBC - Abnormal; Notable for the following components:   WBC 10.8 (*)  RBC 3.26 (*)    Hemoglobin 7.6 (*)    HCT 23.9 (*)    MCV 73.3 (*)    MCH 23.3 (*)    RDW 24.3 (*)    All other  components within normal limits  AMMONIA - Abnormal; Notable for the following components:   Ammonia 62 (*)    All other components within normal limits  ETHANOL - Abnormal; Notable for the following components:   Alcohol, Ethyl (B) 23 (*)    All other components within normal limits  LACTIC ACID, PLASMA - Abnormal; Notable for the following components:   Lactic Acid, Venous 5.7 (*)    All other components within normal limits  RESP PANEL BY RT-PCR (FLU A&B, COVID) ARPGX2  C DIFFICILE QUICK SCREEN W PCR REFLEX  CULTURE, BLOOD (ROUTINE X 2)  CULTURE, BLOOD (ROUTINE X 2)  LIPASE, BLOOD  URINALYSIS, ROUTINE W REFLEX MICROSCOPIC  RAPID URINE DRUG SCREEN, HOSP PERFORMED  OCCULT BLOOD X 1 CARD TO LAB, STOOL  LACTIC ACID, PLASMA  PROTIME-INR  CBC    EKG EKG Interpretation  Date/Time:  Saturday December 14 2019 09:30:10 EST Ventricular Rate:  79 PR Interval:    QRS Duration: 101 QT Interval:  410 QTC Calculation: 473 R Axis:   -33 Text Interpretation: Sinus rhythm Left axis deviation no sig change from previous Confirmed by Charlesetta Shanks [phone]) on 12/14/2019 10:59:25 AM   Radiology DG Chest 2 View  Result Date: 12/14/2019 CLINICAL DATA:  Abdominal pain with vomiting EXAM: CHEST - 2 VIEW COMPARISON:  02/10/2019 FINDINGS: The heart size and mediastinal contours are within normal limits. Both lungs are clear. The visualized skeletal structures are unremarkable. IMPRESSION: No active cardiopulmonary disease. Electronically Signed   By: Jerilynn Mages.  Shick M.D.   On: 12/14/2019 15:05   CT Abdomen Pelvis W Contrast  Result Date: 12/14/2019 CLINICAL DATA:  Abdominal distension EXAM: CT ABDOMEN AND PELVIS WITH CONTRAST TECHNIQUE: Multidetector CT imaging of the abdomen and pelvis was performed using the standard protocol following bolus administration of intravenous contrast. CONTRAST:  13mL OMNIPAQUE IOHEXOL 300 MG/ML  SOLN COMPARISON:  10/04/2019 FINDINGS: Examination is generally limited  by breath motion artifact. Lower chest: No acute abnormality. Large lower esophageal varices (series 2, image 17). Hepatobiliary: Cirrhotic morphology of the liver. Distended gallbladder containing multiple small gallstones. No biliary ductal dilatation. Pancreas: Unremarkable. No pancreatic ductal dilatation or surrounding inflammatory changes. Spleen: Splenomegaly, maximum coronal span 15.9 cm. Adrenals/Urinary Tract: Adrenal glands are unremarkable. Kidneys are normal, without renal calculi, solid lesion, or hydronephrosis. Bladder is unremarkable. Stomach/Bowel: Stomach is within normal limits. Appendix appears normal. No evidence of bowel wall thickening, distention, or inflammatory changes. Vascular/Lymphatic: Aortic atherosclerosis. Varices about the ventral abdomen. No enlarged abdominal or pelvic lymph nodes. Reproductive: No mass or other significant abnormality. Other: No abdominal wall hernia or abnormality. Moderate volume ascites throughout the abdomen and pelvis, increased compared to prior examination. Musculoskeletal: No acute or significant osseous findings. IMPRESSION: 1. Examination is generally limited by breath motion artifact. 2. Stigmata of cirrhosis and portal hypertension, including splenomegaly and moderate volume ascites, increased compared to prior examination. 3. Distended gallbladder containing multiple small gallstones. No biliary ductal dilatation. Aortic Atherosclerosis (ICD10-I70.0). Electronically Signed   By: Eddie Candle M.D.   On: 12/14/2019 15:11    Procedures Procedures (including critical care time)  Medications Ordered in ED Medications  pantoprazole (PROTONIX) 80 mg in sodium chloride 0.9 % 100 mL IVPB (has no administration in time range)  pantoprazole (PROTONIX) 80 mg  in sodium chloride 0.9 % 100 mL (0.8 mg/mL) infusion (8 mg/hr Intravenous New Bag/Given 12/14/19 1441)  pantoprazole (PROTONIX) injection 40 mg (40 mg Intravenous Given 12/14/19 1315)  0.9 %   sodium chloride infusion (has no administration in time range)  pantoprazole (PROTONIX) 40 MG injection (has no administration in time range)  cefTRIAXone (ROCEPHIN) 2 g in sodium chloride 0.9 % 100 mL IVPB (has no administration in time range)  octreotide (SANDOSTATIN) 2 mcg/mL load via infusion 50 mcg (has no administration in time range)    And  octreotide (SANDOSTATIN) 500 mcg in sodium chloride 0.9 % 250 mL (2 mcg/mL) infusion (has no administration in time range)  ondansetron (ZOFRAN) injection 4 mg (4 mg Intravenous Given 12/14/19 1134)  iohexol (OMNIPAQUE) 300 MG/ML solution 100 mL (100 mLs Intravenous Contrast Given 12/14/19 1405)  LORazepam (ATIVAN) injection 1 mg (1 mg Intravenous Given 12/14/19 1321)    ED Course  I have reviewed the triage vital signs and the nursing notes.  Pertinent labs & imaging results that were available during my care of the patient were reviewed by me and considered in my medical decision making (see chart for details).    MDM Rules/Calculators/A&P                         44 year old male with history of alcoholic cirrhosis, who presents with 24 hours of nausea, vomiting, diarrhea, and syncopal episode.  Differential diagnosis for this patient's presentation include but not limited to SBP, ascites secondary to progression of cirrhosis, heart failure malignancy.   Patient borderline hypotensive on intake 113/46.  Vital signs otherwise normal.  Physical exam concerning for abdominal distention with positive fluid wave, scleral icterus.  CBC, CMP, lipase, ammonia, UA, EKG ordered.  EKG with sinus rhythm, no significant change from prior.  CBC with leukocytosis of 10.8, anemia with hemoglobin of 7.6, previously 8.8  57-month ago. Unfortunately the patient is being evaluated initially at [facility] where we are unable to administer blood transfusions.  Will have to wait for blood transfusion until transfer to facility with higher level of  care.  CMP with hyponatremia to 128, elevated BUN to 31.  Total bilirubin elevated to 6.2, previously 4.0; AST/ALT elevated to 90/45.  Lipase normal, 49.  Ammonia elevated to 62.  According to the nurse there was minimal change in blood pressure during orthostatic vital signs with change from lying to sitting, however with change from sitting to standing patient became extremely symptomatic with lightheadedness, nausea, was unable to tolerate duration of standing in order to evaluate blood pressure.  Case discussed with attending physician Dr. Vallery Ridge.  Less concern for spontaneous bacterial peritonitis as patient does not have any abdominal tenderness palpation, does not report any abdominal pain.  Due to marked anemia with > 1g drop from prior, as well as elevated total bili to 6.2, I feel this patient warrants admission to the hospital at this time.   Consult placed to hosptialist Dr Marylyn Ishihara, who is agreeable to admitting this patient to his service at either [facility] or [facility] - pending availability of telemetry bed. I appreciate his collaboration in the care of this patient.  Hospitalist requesting UDS, ethanol levels.  I am happy to place these orders, as well as respiratory pathogen panel.  Received phone call from family medicine resident Dr. Maudie Mercury, who is requesting that patient be admitted to her their service given his primary care doctors within their group.  This is reasonable, I requested that she speak with the hospitalist for transfer of care between the groups.  Serologic results, as well as plan for admission were discussed with the patient, who voiced understanding.  Each of his questions were answered to his expressed satisfaction.  Following conversation with hospitalist, patient passed large amount of dark liquid stool per rectum, which is concerning for upper GI bleed.  We will proceed with Protonix bolus and infusion, hemoccult blood, C. difficile panel, and will proceed with  chest x-ray, CT abdomen pelvis at this time. PTINR/Lactic acid / blood cultures ordered.  Nursing staff informed me that we do not currently have supplies to administer Protonix drip at this facility at this time.  We will proceed with 40 mg IV bolus , while awaiting transfer to facility with higher level of care.  Octreotide, rocephin ordered.   Lactic acid elevated to 5.7  At this point patient is becoming too unstable to keep at this facility while awaiting inpatient bed.  Will  proceed with ED to ED transfer to [facility], accepting physician Dr. Ron Parker. I appreciated his collaboration in the care of this patient.   Nursing staff informed me that patient has been assigned a stepdown bed available at [facility] at this time.  I contacted Dr. Maudie Mercury, family medicine resident, via telephone who is agreeable to changing his admission order to stepdown bed at this time.  Will transfer the patient directly to stepdown unit rather than going ED to ED.  Final Clinical Impression(s) / ED Diagnoses Final diagnoses:  Alcoholic cirrhosis of liver with ascites [facility])    Rx / DC Orders ED Discharge Orders    None       Aura Dials 12/14/19 1528    Charlesetta Shanks, MD 12/15/19 [phone]

## 2019-12-14 NOTE — ED Notes (Signed)
Pt aware of UA needed

## 2019-12-14 NOTE — ED Notes (Signed)
Pt lying flat for orthostatic VS

## 2019-12-14 NOTE — Progress Notes (Addendum)
Adrriane, RN called and gave me report at 1530. Patient arrived to unit at 1550 via stretcher and EMT transport from [facility] ED , patient lethargic but alert and responsive, patient A/ox3. Patient skin displays Juandice all over. Notified Dr. Maudie Mercury face to face at this time about patient HGB 5.9 and Lactic acid 8.6. Dr. Maudie Mercury at bedside and Saralyn Pilar Rapid response RN made aware of patient deteriorating condition at this time. Waiting for further orders at this time.

## 2019-12-14 NOTE — H&P (Signed)
NAME:  Joshua Hubbard, MRN:  [id number], DOB:  [birth year], LOS: 0 ADMISSION DATE:  12/14/2019, CONSULTATION DATE:  12/14/2019 REFERRING MD:  Zettie Cooley, FPTS, CHIEF COMPLAINT:  Hematemesis   Brief History   44 yo male with hx of ETOH presented to [facility] with nausea, vomiting and diarrhea for 1 day prior to admission.  Has hx of cirrhosis with esophageal varices.  Found to have elevated ETOH level (23), ammonia (62), and lactic acid (5.7).  Developed hematemesis and progressive anemia causing hypotension.  He was transferred to ICU and PCCM assumed care.  History of present illness   Pt confused and not able to provide additional history.  Past Medical History  HTN, ETOH with cirrhosis, esophageal varices, Chronic opiate use, Chronic benzo use, Depression  Significant Hospital Events   11/20 Admit to FPTS, transfer to ICU  Consults:  [facility]  Procedures:    Significant Diagnostic Tests:  CT abd/pelvis 11/20 >> large esophageal varices, changes of cirrhosis, multiple gallstones, distended GB, splenomegaly 15.9 cm, mod ascites  Micro Data:  COVID/flu 11/20 >> negative MRSA PCR 11/20 >> negative Blood 11/20 >>  Antimicrobials:    Interim history/subjective:    Objective   Blood pressure (!) 80/52, pulse (!) 101, temperature 99.3 F (37.4 C), temperature source Oral, resp. rate (!) 31, height 5\' 9"  (1.753 m), weight 74.8 kg, SpO2 99 %.       No intake or output data in the 24 hours ending 12/14/19 1939 Filed Weights   12/14/19 0914  Weight: 74.8 kg    Examination:  General - jaundiced Eyes - pupils reactive ENT - no sinus tenderness, no stridor Cardiac - regular, tachycardic Chest - equal breath sounds b/l, no wheezing or rales Abdomen - soft, mild distention, non tender, decreased bowel sounds Extremities - decreased muscle bulk Skin - no rashes Neuro - moves extremities, confused   Resolved Hospital Problem list     Assessment & Plan:   Hemorrhagic  shock in setting of Upper GI bleeding with hx of ETOH with cirrhosis, ascites, and esophageal varices. - continue octreotide, protonix - levophed to keep MAP > 65 - GI consulted - hold outpt lasix, aldactone, inderal - f/u LFTs  Acute blood loss anemia from GI bleeding. Coagulopathy in setting of cirrhosis. - f/u CBC, INR after PRBC and FFP transfusion on 70/96  Acute metabolic encephalopathy with concern for developing delirium tremens. Chronic opiate/benzo use. - CIWA q4h with prn ativan for CIWA > 8 - might need precedex - hold outpt wellbutrin, klonopin, chronulac, oxycontin  Lactic acidosis in setting of hemorrhagic shock and liver disease. - f/u lactic acid - optimize hemodynamics  Hyponatremia. - continue NS IV fluid - f/u BMET  Hyperglycemia. - check HbA1C - SSI  Best practice:  Diet: NPO DVT prophylaxis: SCDs GI prophylaxis: Protonix Mobility: bed rest Code Status: full Disposition: ICU  Labs   CBC: Recent Labs  Lab 12/14/19 0928 12/14/19 1654  WBC 10.8* 14.6*  HGB 7.6* 5.9*  HCT 23.9* 18.4*  MCV 73.3* 75.1*  PLT 201 283    Basic Metabolic Panel: Recent Labs  Lab 12/14/19 0928  NA 128*  K 4.3  CL 94*  CO2 23  GLUCOSE 164*  BUN 31*  CREATININE 0.67  CALCIUM 8.1*   GFR: Estimated Creatinine Clearance: 116.6 mL/min (by C-G formula based on SCr of 0.67 mg/dL). Recent Labs  Lab 12/14/19 0928 12/14/19 1314 12/14/19 1654  WBC 10.8*  --  14.6*  LATICACIDVEN  --  5.7* 8.0*    Liver Function Tests: Recent Labs  Lab 12/14/19 0928  AST 90*  ALT 45*  ALKPHOS 72  BILITOT 6.2*  PROT 6.8  ALBUMIN 2.3*   Recent Labs  Lab 12/14/19 0928  LIPASE 49   Recent Labs  Lab 12/14/19 1121  AMMONIA 62*    ABG No results found for: PHART, PCO2ART, PO2ART, HCO3, TCO2, ACIDBASEDEF, O2SAT   Coagulation Profile: Recent Labs  Lab 12/14/19 1654  INR 2.8*    Cardiac Enzymes: No results for input(s): CKTOTAL, CKMB, CKMBINDEX, TROPONINI in  the last 168 hours.  HbA1C: No results found for: HGBA1C  CBG: No results for input(s): GLUCAP in the last 168 hours.  Review of Systems:   Unable to obtain  Past Medical History  He,  has a past medical history of Arthritis, Blood transfusion without reported diagnosis, Cirrhosis ([place]), Hypertension, PONV (postoperative nausea and vomiting), and Tears of meniscus and ACL of left knee (03/19/2018).   Surgical History    Past Surgical History:  Procedure Laterality Date  . ARTHROSCOPIC REPAIR ACL     x2  . BIOPSY  10/05/2018   Procedure: BIOPSY;  Surgeon: Ronald Lobo, MD;  Location: [place];  Service: Endoscopy;;  . ESOPHAGEAL BANDING  02/12/2019   Procedure: ESOPHAGEAL BANDING;  Surgeon: [name], MD;  Location: [place];  Service: Gastroenterology;;  . ESOPHAGOGASTRODUODENOSCOPY (EGD) WITH PROPOFOL N/A 10/05/2018   Procedure: ESOPHAGOGASTRODUODENOSCOPY (EGD) WITH PROPOFOL;  Surgeon: Ronald Lobo, MD;  Location: [place];  Service: Endoscopy;  Laterality: N/A;  . ESOPHAGOGASTRODUODENOSCOPY (EGD) WITH PROPOFOL N/A 02/12/2019   Procedure: ESOPHAGOGASTRODUODENOSCOPY (EGD) WITH PROPOFOL;  Surgeon: [name], MD;  [facility];  Service: Gastroenterology;  Laterality: N/A;  . HERNIA REPAIR     x3  . KNEE ARTHROSCOPY WITH ANTERIOR CRUCIATE LIGAMENT (ACL) REPAIR WITH HAMSTRING GRAFT Left 03/20/2018   Procedure: KNEE ARTHROSCOPY WITH REVESION ANTERIOR CRUCIATE LIGAMENT (ACL) REPAIR WITH  HAMSTRING ALLOGRAFT,;  Surgeon: Elsie Saas, MD;  Location: [place];  Service: Orthopedics;  Laterality: Left;  . KNEE ARTHROSCOPY WITH LATERAL MENISECTOMY Left 03/20/2018   Procedure: KNEE ARTHROSCOPY WITH LATERAL MENISECTOMY;  Surgeon: Elsie Saas, MD;  Location: [place];  Service: Orthopedics;  Laterality: Left;  . KNEE ARTHROSCOPY WITH MEDIAL MENISECTOMY Left 03/20/2018   Procedure: KNEE ARTHROSCOPY WITH MEDIAL  MENISECTOMY;  Surgeon: Elsie Saas, MD;  Location: [place];  Service: Orthopedics;  Laterality: Left;     Social History   reports that he has never smoked. He has never used smokeless tobacco. He reports current alcohol use of about 42.0 standard drinks of alcohol per week. He reports that he does not use drugs.   Family History   His family history includes Coronary artery disease in his father; Hyperlipidemia in his father; Osteoarthritis in his mother.   Allergies No Known Allergies   Home Medications  Prior to Admission medications   Medication Sig Start Date End Date Taking? Authorizing Provider  acamprosate (CAMPRAL) 333 MG tablet Take 2 tablets (666 mg total) by mouth 3 (three) times daily. 10/07/19   [name], MD  buPROPion (WELLBUTRIN XL) 150 MG 24 hr tablet TAKE 1 TABLET BY MOUTH EVERY DAY 09/23/19   [name], MD  clonazePAM (KLONOPIN) 1 MG tablet TAKE 1 TABLET BY MOUTH AT BEDTIME 03/26/19   [name], MD  furosemide (LASIX) 20 MG tablet  11/15/19   [provider]  lactulose (CHRONULAC) 10 GM/15ML solution Take  20 g by mouth daily. 11/25/19   [provider]  Multiple Vitamin (MULTIVITAMIN WITH MINERALS) TABS tablet Take 1 tablet by mouth daily.    [provider]  oxyCODONE (OXYCONTIN) 20 mg 12 hr tablet Take one or two by mouth every 12 hours needed for pain 11/19/19   [name], MD  pantoprazole (PROTONIX) 40 MG tablet Take 1 tablet (40 mg total) by mouth daily. 02/15/19 03/17/19  Cherylann Ratel A, DO  propranolol (INDERAL) 20 MG tablet Take 1 tablet (20 mg total) by mouth 2 (two) times daily. 10/16/19   [name], MD  spironolactone (ALDACTONE) 50 MG tablet Take one a day for a week then start one bid 10/11/19   [name], MD     Critical care time: 38 minutes  Chesley Mires, MD [place] Pager - [phone] 12/14/2019, 7:39 PM

## 2019-12-14 NOTE — Progress Notes (Signed)
Report received at bedside from rapid response RN, patrick.

## 2019-12-14 NOTE — ED Notes (Signed)
PT had a bowel movement with very dark, very tarry, diarrhea-like consistency stool.

## 2019-12-14 NOTE — ED Notes (Signed)
Lactic Acid 5.7, results given to ED MD and Vincente Liberty RN

## 2019-12-14 NOTE — ED Notes (Signed)
Pt vomited dark red blood

## 2019-12-14 NOTE — H&P (Signed)
[facility] Admission History and Physical Service Pager: [phone]  Patient name: Joshua Hubbard Medical record number: [id number] Date of birth: 1976-01-12 Age: 44 y.o. Gender: male  Primary Care Provider: [name], MD Consultants: Gastroenterology  Code Status: Full Code  Preferred Emergency Contact :    Contact Information    Name Relation Home Work Mobile   Epp,Jim Father [phone]  [phone]   [place], [place] [phone]       Chief Complaint: Diarrhea and Emesis  Assessment and Plan: Joshua Hubbard is a 44 y.o. male who presented with melenotic diarrhea, NBNB emesis and syncopal episodeand was admitted for decompensated liver cirrhosis. Joshua Hubbard has a pmhx s/f hypertension, etoh cirrhosis (last ETOH January 2021), hx of esophageal varices (s/p banding 09/2018).    Melena & Hematemesis  Anemia  Lactic acidosis  Patient presented to [facility] after 24 hours of nausea, vomiting, diarrhea.  Patient reported for nonbloody nonbilious episodes of emesis as well as several episodes of new onset melanotic diarrhea.  Patient reported that he had a syncopal episode after getting up from the toilet where he fell forward into the bathroom door.  Patient presented this morning to the [facility] with stable vital signs.  0930 labs significant for sodium 128, creatinine 0.67, T bili 6.2, AST/ALT 90/45, alk phos 72, ammonia 62, EtOH 23, hemoglobin 7.6, platelets 201.  Baseline hemoglobin 8-9. He also had a lactate of 5.7 at 1300.  PT/INR pending. His CT abdomen pelvis significant for stigmata of cirrhosis and portal hypertension with splenomegaly and moderate volume ascites, increased from prior examination.  He had mild gallbladder distention with multiple stones.Chest x-ray had no acute findings.  Blood cultures x2 were drawn.  EKG was sinus rhythm.  GI consulted (Dr. Watt Climes) who will see patient in the AM for EGD.  On arrival to  [facility] progressive unit, patient with hemoglobin 5.9 & lactic acid of 8.0. He had one episode of hematemesis at [facility] just prior to transfer at 1450.  . Admit to FMTS, attending Joshua Hubbard. Level of care: progressive  and with continuous cardiac monitoring  . Activity strict bed rest  . C/S Gastroenterology who will see him in the AM for scope . Patient NPO  . 2L NS bolus, then recheck Lactic acid  . Hold VTE ppx  . IV PTX load, then BID  . IV octreotide . RN currently working on IV access.   Decompensated Alcoholic Liver Cirrhosis Patient recently seen as new consult at [facility] gastroenterology on 11/25/2019.  At that time, patient noted he had stopped drinking in January 2021 and had been on a compensate since June 2021. He is scheduled for Upper endoscopy 01/13/20.  Current medications include a compensated 333 mg 3 times daily, Lasix 20 mg once daily, spironolactone 50 mg once daily, propranolol 20 mg twice daily, pantoprazole 40 mg once daily. Prior concern for [facility] on previous CT. Initial consult with Dr. Oralia Rud t [facility] on 10/18/19 with negative tumor markers. Recommendations for MRI follow up in 3 months. Also referred patient to translplant hepatology for decompensated cirrhosis.  MELD-Na: 29. . Gastroenterology consulted . Holding home medications in acute setting   Alcohol use Previous extensive use with up to a pint of liquor per day from his late 68s to early 3s.  Patient reports that he quit after his presentation with esophageal varices in September 2020. ETOH 23 today. Patient denies use. Unknown hx of DT's or ETOH  withdrawal  . CIWA  . TOC consult when patient more stable   #FEN/GI:  . Fluids: 2L bolus, PTX infusion, octreotide infusion   . Electrolytes: monitor daily   . Nutrition: NPO for EGD 11/21    Access: Left AC, Right forearm  VTE prophylaxis: Contraindicated due to bleed, SCDs  Disposition: Observation- progressive, likely will need admission to inpatient status if  worsening clinical status.   ============================================================================= HPI Joshua Hubbard is a 44 y.o. male with past medical history significant for etoh cirrhosis (last ETOH January 2021), hx of esophageal varices (s/p banding 09/2018), who presents with melanotic diarrhea since Tuesday.  Per ED provider, patient had 4 episodes of NBNB emesis and severe dark-colored diarrhea over the last 24 hours.  Patient also reported an episode of syncope last night and reports he fell forward into the door. Unable to obtain the remainder of history from patient given his mental status.  In the ED, prior to transfer to [facility], patient started on octreotide infusion and pantoprazole infusion.  Also given 2 g of ceftriaxone for elevated lactic acid  Abnormal Labs Reviewed  COMPREHENSIVE METABOLIC PANEL - Abnormal; Notable for the following components:      Result Value   Sodium 128 (*)    Chloride 94 (*)    Glucose, Bld 164 (*)    BUN 31 (*)    Calcium 8.1 (*)    Albumin 2.3 (*)    AST 90 (*)    ALT 45 (*)    Total Bilirubin 6.2 (*)    All other components within normal limits  CBC - Abnormal; Notable for the following components:   WBC 10.8 (*)    RBC 3.26 (*)    Hemoglobin 7.6 (*)    HCT 23.9 (*)    MCV 73.3 (*)    MCH 23.3 (*)    RDW 24.3 (*)    All other components within normal limits  AMMONIA - Abnormal; Notable for the following components:   Ammonia 62 (*)    All other components within normal limits  ETHANOL - Abnormal; Notable for the following components:   Alcohol, Ethyl (B) 23 (*)    All other components within normal limits  LACTIC ACID, PLASMA - Abnormal; Notable for the following components:   Lactic Acid, Venous 5.7 (*)    All other components within normal limits  CBC - Abnormal; Notable for the following components:   WBC 14.6 (*)    RBC 2.45 (*)    Hemoglobin 5.9 (*)    HCT 18.4 (*)    MCV 75.1 (*)    MCH 24.1 (*)    RDW  24.3 (*)    All other components within normal limits  PROTIME-INR - Abnormal; Notable for the following components:   Prothrombin Time 28.4 (*)    INR 2.8 (*)    All other components within normal limits  LACTIC ACID, PLASMA - Abnormal; Notable for the following components:   Lactic Acid, Venous 8.0 (*)    All other components within normal limits    Review Of Systems: Review of Systems  Unable to perform ROS: Mental status change   Patient Active Problem List   Diagnosis Date Noted  . Syncope 12/14/2019  . Decompensated liver disease ([place]) 12/14/2019  . Hyponatremia 11/08/2019  . Lesion of liver greater than 1 cm in diameter with liver disease conferring risk of hepatocellular carcinoma 10/08/2019  . Psychosocial stressors 05/03/2019  . Hx of  Upper GI  bleed 02/11/2019  . Coagulopathy ([place]) 02/11/2019  . Alcohol abuse with unspecified alcohol-induced disorder ([place]) 10/08/2018  . Liver cirrhosis ([place]) 10/05/2018  . Thrombocytopenia ([place]) 10/05/2018  . Tears of meniscus and ACL of left knee 03/19/2018  . Asymmetrical hearing loss of right ear 03/02/2017  . Tinnitus of both ears 02/01/2017  . Hoarseness of voice 02/01/2017  . 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 CLOSED DISLOCATION OF TARSAL JOINT UNSPECIFIED 12/25/2007   Past Medical History: Past Medical History:  Diagnosis Date  . Arthritis   . Blood transfusion without reported diagnosis    recevied 4 units PRBC and FFP during hospitalization from 1/17-1/22/21  . Cirrhosis ([place])   . Hypertension   . PONV (postoperative nausea and vomiting)   . Tears of meniscus and ACL of left knee 03/19/2018    Past Surgical History: Past Surgical History:  Procedure Laterality Date  . ARTHROSCOPIC REPAIR ACL     x2  . BIOPSY  10/05/2018   Procedure: BIOPSY;  Surgeon: Ronald Lobo, MD;  Location: [place];  Service: Endoscopy;;  . ESOPHAGEAL  BANDING  02/12/2019   Procedure: ESOPHAGEAL BANDING;  Surgeon: [name], MD;  Location: [place];  Service: Gastroenterology;;  . ESOPHAGOGASTRODUODENOSCOPY (EGD) WITH PROPOFOL N/A 10/05/2018   Procedure: ESOPHAGOGASTRODUODENOSCOPY (EGD) WITH PROPOFOL;  Surgeon: Ronald Lobo, MD;  Location: [place];  Service: Endoscopy;  Laterality: N/A;  . ESOPHAGOGASTRODUODENOSCOPY (EGD) WITH PROPOFOL N/A 02/12/2019   Procedure: ESOPHAGOGASTRODUODENOSCOPY (EGD) WITH PROPOFOL;  Surgeon: [name], MD;  [facility];  Service: Gastroenterology;  Laterality: N/A;  . HERNIA REPAIR     x3  . KNEE ARTHROSCOPY WITH ANTERIOR CRUCIATE LIGAMENT (ACL) REPAIR WITH HAMSTRING GRAFT Left 03/20/2018   Procedure: KNEE ARTHROSCOPY WITH REVESION ANTERIOR CRUCIATE LIGAMENT (ACL) REPAIR WITH  HAMSTRING ALLOGRAFT,;  Surgeon: Elsie Saas, MD;  Location: [place];  Service: Orthopedics;  Laterality: Left;  . KNEE ARTHROSCOPY WITH LATERAL MENISECTOMY Left 03/20/2018   Procedure: KNEE ARTHROSCOPY WITH LATERAL MENISECTOMY;  Surgeon: Elsie Saas, MD;  Location: [place];  Service: Orthopedics;  Laterality: Left;  . KNEE ARTHROSCOPY WITH MEDIAL MENISECTOMY Left 03/20/2018   Procedure: KNEE ARTHROSCOPY WITH MEDIAL MENISECTOMY;  Surgeon: Elsie Saas, MD;  Location: [place];  Service: Orthopedics;  Laterality: Left;    Family History: family history includes Coronary artery disease in his father; Hyperlipidemia in his father; Osteoarthritis in his mother.   Social History: Social History   Social History Narrative  . Not on file    Joshua Hubbard reports that he has never smoked. He has never used smokeless tobacco. He reports current alcohol use of about 42.0 standard drinks of alcohol per week. He reports that he does not use drugs.  Allergies and Medications: No Known Allergies No outpatient medications have been marked as taking for the  12/14/19 encounter [facility] Encounter).    Objective: BP (!) 103/57   Pulse 88   Temp 99.3 F (37.4 C) (Oral)   Resp (!) 29   Ht 5' 9"  (1.753 m)   Wt 74.8 kg   SpO2 99%   BMI 24.37 kg/m  Filed Weights   12/14/19 0914  Weight: 74.8 kg    Exam: Physical Exam Vitals reviewed.  Constitutional:      Appearance: He is ill-appearing and toxic-appearing.  HENT:     Head: Normocephalic.     Mouth/Throat:     Mouth: Mucous membranes are  dry.  Eyes:     General: Scleral icterus present.  Cardiovascular:     Rate and Rhythm: Normal rate and regular rhythm.  Pulmonary:     Effort: Pulmonary effort is normal.     Breath sounds: Normal breath sounds.  Abdominal:     General: There is distension.     Tenderness: There is abdominal tenderness. There is no rebound.      Labs and Imaging: I have personally reviewed following labs and imaging studies CBC: Recent Labs  Lab 12/14/19 0928 12/14/19 1654  WBC 10.8* 14.6*  HGB 7.6* 5.9*  HCT 23.9* 18.4*  MCV 73.3* 75.1*  PLT 201 155   CMP: Recent Labs  Lab 12/14/19 0928  NA 128*  K 4.3  CL 94*  CO2 23  GLUCOSE 164*  BUN 31*  CREATININE 0.67  CALCIUM 8.1*  ALBUMIN 2.3*   GFR: Estimated Creatinine Clearance: 116.6 mL/min (by C-G formula based on SCr of 0.67 mg/dL).  Liver Function Tests: Recent Labs  Lab 12/14/19 0928  AST 90*  ALT 45*  ALKPHOS 72  BILITOT 6.2*  PROT 6.8  ALBUMIN 2.3*   Recent Labs  Lab 12/14/19 0928 12/14/19 1121  LIPASE 49  --   AMMONIA  --  62*    Coagulation Profile: Recent Labs  Lab 12/14/19 1654  INR 2.8*   COVID negative    Imaging/Diagnostic Tests: DG Chest 2 View  Result Date: 12/14/2019 CLINICAL DATA:  Abdominal pain with vomiting EXAM: CHEST - 2 VIEW COMPARISON:  02/10/2019 FINDINGS: The heart size and mediastinal contours are within normal limits. Both lungs are clear. The visualized skeletal structures are unremarkable. IMPRESSION: No active cardiopulmonary  disease. Electronically Signed   By: Jerilynn Mages.  Shick M.D.   On: 12/14/2019 15:05   CT Abdomen Pelvis W Contrast  Result Date: 12/14/2019 CLINICAL DATA:  Abdominal distension EXAM: CT ABDOMEN AND PELVIS WITH CONTRAST TECHNIQUE: Multidetector CT imaging of the abdomen and pelvis was performed using the standard protocol following bolus administration of intravenous contrast. CONTRAST:  175m OMNIPAQUE IOHEXOL 300 MG/ML  SOLN COMPARISON:  10/04/2019 FINDINGS: Examination is generally limited by breath motion artifact. Lower chest: No acute abnormality. Large lower esophageal varices (series 2, image 17). Hepatobiliary: Cirrhotic morphology of the liver. Distended gallbladder containing multiple small gallstones. No biliary ductal dilatation. Pancreas: Unremarkable. No pancreatic ductal dilatation or surrounding inflammatory changes. Spleen: Splenomegaly, maximum coronal span 15.9 cm. Adrenals/Urinary Tract: Adrenal glands are unremarkable. Kidneys are normal, without renal calculi, solid lesion, or hydronephrosis. Bladder is unremarkable. Stomach/Bowel: Stomach is within normal limits. Appendix appears normal. No evidence of bowel wall thickening, distention, or inflammatory changes. Vascular/Lymphatic: Aortic atherosclerosis. Varices about the ventral abdomen. No enlarged abdominal or pelvic lymph nodes. Reproductive: No mass or other significant abnormality. Other: No abdominal wall hernia or abnormality. Moderate volume ascites throughout the abdomen and pelvis, increased compared to prior examination. Musculoskeletal: No acute or significant osseous findings. IMPRESSION: 1. Examination is generally limited by breath motion artifact. 2. Stigmata of cirrhosis and portal hypertension, including splenomegaly and moderate volume ascites, increased compared to prior examination. 3. Distended gallbladder containing multiple small gallstones. No biliary ductal dilatation. Aortic Atherosclerosis (ICD10-I70.0). Electronically  Signed   By: AEddie CandleM.D.   On: 12/14/2019 15:11   KWilber Oliphant M.D. 12/14/2019, 6:39 PM PGY-3, [facility] pager: [phone] text pages welcome

## 2019-12-14 NOTE — ED Notes (Signed)
Attempted to take pt for imaging but pt wants to wait until he receives something for his nausea;  Spoke w/ RN and MD; RN to administer meds and let CT know when pt is ready

## 2019-12-15 ENCOUNTER — Encounter (HOSPITAL_COMMUNITY): Payer: Self-pay | Admitting: Pulmonary Disease

## 2019-12-15 ENCOUNTER — Encounter (HOSPITAL_COMMUNITY)
Admission: EM | Disposition: A | Discharge status: Home / Self Care | Payer: Self-pay | Source: Home / Self Care | Attending: Family Medicine

## 2019-12-15 DIAGNOSIS — D62 Acute posthemorrhagic anemia: Secondary | ICD-10-CM

## 2019-12-15 DIAGNOSIS — R578 Other shock: Secondary | ICD-10-CM | POA: Diagnosis not present

## 2019-12-15 DIAGNOSIS — G9341 Metabolic encephalopathy: Secondary | ICD-10-CM

## 2019-12-15 DIAGNOSIS — I8511 Secondary esophageal varices with bleeding: Secondary | ICD-10-CM

## 2019-12-15 DIAGNOSIS — E871 Hypo-osmolality and hyponatremia: Secondary | ICD-10-CM

## 2019-12-15 DIAGNOSIS — K7031 Alcoholic cirrhosis of liver with ascites: Secondary | ICD-10-CM | POA: Diagnosis not present

## 2019-12-15 HISTORY — PX: ESOPHAGOGASTRODUODENOSCOPY (EGD) WITH PROPOFOL: SHX5813

## 2019-12-15 HISTORY — PX: ESOPHAGEAL BANDING: SHX5518

## 2019-12-15 LAB — CBC
HCT: 20.9 % — ABNORMAL LOW (ref 39.0–52.0)
HCT: 22.4 % — ABNORMAL LOW (ref 39.0–52.0)
HCT: 23.3 % — ABNORMAL LOW (ref 39.0–52.0)
Hemoglobin: 6.7 g/dL — CL (ref 13.0–17.0)
Hemoglobin: 7.6 g/dL — ABNORMAL LOW (ref 13.0–17.0)
Hemoglobin: 8.1 g/dL — ABNORMAL LOW (ref 13.0–17.0)
MCH: 27.3 pg (ref 26.0–34.0)
MCH: 28.4 pg (ref 26.0–34.0)
MCH: 28.9 pg (ref 26.0–34.0)
MCHC: 32.1 g/dL (ref 30.0–36.0)
MCHC: 33.9 g/dL (ref 30.0–36.0)
MCHC: 34.8 g/dL (ref 30.0–36.0)
MCV: 85.3 fL (ref 80.0–100.0)
MCV: 83.6 fL (ref 80.0–100.0)
MCV: 83.2 fL (ref 80.0–100.0)
Platelets: 76 10*3/uL — ABNORMAL LOW (ref 150–400)
Platelets: 49 10*3/uL — ABNORMAL LOW (ref 150–400)
Platelets: 30 10*3/uL — ABNORMAL LOW (ref 150–400)
RBC: 2.45 MIL/uL — ABNORMAL LOW (ref 4.22–5.81)
RBC: 2.68 MIL/uL — ABNORMAL LOW (ref 4.22–5.81)
RBC: 2.8 MIL/uL — ABNORMAL LOW (ref 4.22–5.81)
RDW: 22.5 % — ABNORMAL HIGH (ref 11.5–15.5)
RDW: 18.8 % — ABNORMAL HIGH (ref 11.5–15.5)
RDW: 18.2 % — ABNORMAL HIGH (ref 11.5–15.5)
WBC: 21.9 10*3/uL — ABNORMAL HIGH (ref 4.0–10.5)
WBC: 20.5 10*3/uL — ABNORMAL HIGH (ref 4.0–10.5)
WBC: 17.7 10*3/uL — ABNORMAL HIGH (ref 4.0–10.5)
nRBC: 0 % (ref 0.0–0.2)
nRBC: 0 % (ref 0.0–0.2)
nRBC: 0 % (ref 0.0–0.2)

## 2019-12-15 LAB — PREPARE PLATELET PHERESIS
Unit division: 0
Unit division: 0

## 2019-12-15 LAB — PHOSPHORUS: Phosphorus: 5.3 mg/dL — ABNORMAL HIGH (ref 2.5–4.6)

## 2019-12-15 LAB — COMPREHENSIVE METABOLIC PANEL
ALT: 47 U/L — ABNORMAL HIGH (ref 0–44)
AST: 95 U/L — ABNORMAL HIGH (ref 15–41)
Albumin: 1.7 g/dL — ABNORMAL LOW (ref 3.5–5.0)
Alkaline Phosphatase: 51 U/L (ref 38–126)
Anion gap: 15 (ref 5–15)
BUN: 37 mg/dL — ABNORMAL HIGH (ref 6–20)
CO2: 16 mmol/L — ABNORMAL LOW (ref 22–32)
Calcium: 8.1 mg/dL — ABNORMAL LOW (ref 8.9–10.3)
Chloride: 102 mmol/L (ref 98–111)
Creatinine, Ser: 1.03 mg/dL (ref 0.61–1.24)
GFR, Estimated: >60 mL/min (ref >60)
Glucose, Bld: 166 mg/dL — ABNORMAL HIGH (ref 70–99)
Potassium: 4.5 mmol/L (ref 3.5–5.1)
Sodium: 133 mmol/L — ABNORMAL LOW (ref 135–145)
Total Bilirubin: 6.9 mg/dL — ABNORMAL HIGH (ref 0.3–1.2)
Total Protein: 4.8 g/dL — ABNORMAL LOW (ref 6.5–8.1)

## 2019-12-15 LAB — HEMOGLOBIN A1C
Hgb A1c MFr Bld: 5 % (ref 4.8–5.6)
Mean Plasma Glucose: 96.8 mg/dL

## 2019-12-15 LAB — PREPARE RBC (CROSSMATCH)

## 2019-12-15 LAB — GLUCOSE, CAPILLARY
Glucose-Capillary: 147 mg/dL — ABNORMAL HIGH (ref 70–99)
Glucose-Capillary: 159 mg/dL — ABNORMAL HIGH (ref 70–99)
Glucose-Capillary: 125 mg/dL — ABNORMAL HIGH (ref 70–99)
Glucose-Capillary: 115 mg/dL — ABNORMAL HIGH (ref 70–99)
Glucose-Capillary: 133 mg/dL — ABNORMAL HIGH (ref 70–99)
Glucose-Capillary: 125 mg/dL — ABNORMAL HIGH (ref 70–99)

## 2019-12-15 LAB — AMMONIA: Ammonia: 42 umol/L — ABNORMAL HIGH (ref 9–35)

## 2019-12-15 LAB — BPAM PLATELET PHERESIS
Blood Product Expiration Date: 202111212359
Blood Product Expiration Date: 202111232359
ISSUE DATE / TIME: 202111201941
ISSUE DATE / TIME: 202111202203
Unit Type and Rh: 6200
Unit Type and Rh: 7300

## 2019-12-15 LAB — MAGNESIUM: Magnesium: 1.2 mg/dL — ABNORMAL LOW (ref 1.7–2.4)

## 2019-12-15 LAB — LACTIC ACID, PLASMA
Lactic Acid, Venous: >11 mmol/L (ref 0.5–1.9)
Lactic Acid, Venous: 8.8 mmol/L (ref 0.5–1.9)

## 2019-12-15 LAB — PROTIME-INR
INR: 4 — ABNORMAL HIGH (ref 0.8–1.2)
INR: 2.9 — ABNORMAL HIGH (ref 0.8–1.2)
Prothrombin Time: 37.7 seconds — ABNORMAL HIGH (ref 11.4–15.2)
Prothrombin Time: 29.3 seconds — ABNORMAL HIGH (ref 11.4–15.2)

## 2019-12-15 LAB — HEMOGLOBIN AND HEMATOCRIT, BLOOD
HCT: 22.9 % — ABNORMAL LOW (ref 39.0–52.0)
Hemoglobin: 7.9 g/dL — ABNORMAL LOW (ref 13.0–17.0)

## 2019-12-15 SURGERY — ESOPHAGOGASTRODUODENOSCOPY (EGD) WITH PROPOFOL
Anesthesia: Moderate Sedation

## 2019-12-15 MED ORDER — VITAMIN K1 10 MG/ML IJ SOLN
5.0000 mg | Freq: Once | INTRAVENOUS | Status: AC
  Administered 2019-12-15: 5 mg via INTRAVENOUS
  Filled 2019-12-15: qty 0.5

## 2019-12-15 MED ORDER — SODIUM CHLORIDE 0.9% IV SOLUTION: Freq: Once | INTRAVENOUS | Status: AC

## 2019-12-15 MED ORDER — MIDAZOLAM HCL (PF) 5 MG/ML IJ SOLN
INTRAMUSCULAR | Status: AC
  Filled 2019-12-15: qty 2

## 2019-12-15 MED ORDER — DIPHENHYDRAMINE HCL 50 MG/ML IJ SOLN
INTRAMUSCULAR | Status: DC | PRN
  Administered 2019-12-15: 25 mg via INTRAVENOUS

## 2019-12-15 MED ORDER — MIDAZOLAM HCL (PF) 5 MG/ML IJ SOLN
INTRAMUSCULAR | Status: DC | PRN
  Administered 2019-12-15 (×2): 2 mg via INTRAVENOUS
  Administered 2019-12-15: 1 mg via INTRAVENOUS
  Administered 2019-12-15 (×2): 2 mg via INTRAVENOUS
  Administered 2019-12-15: 1 mg via INTRAVENOUS

## 2019-12-15 MED ORDER — THIAMINE HCL 100 MG/ML IJ SOLN
100.0000 mg | Freq: Every day | INTRAMUSCULAR | Status: DC
  Administered 2019-12-15 – 2019-12-17 (×3): 100 mg via INTRAVENOUS
  Filled 2019-12-15 (×3): qty 2

## 2019-12-15 MED ORDER — SODIUM CHLORIDE 0.9 % IV SOLN
1.0000 mg | Freq: Once | INTRAVENOUS | Status: AC
  Administered 2019-12-15: 1 mg via INTRAVENOUS
  Filled 2019-12-15: qty 0.2

## 2019-12-15 MED ORDER — DIPHENHYDRAMINE HCL 50 MG/ML IJ SOLN
INTRAMUSCULAR | Status: AC
  Filled 2019-12-15: qty 1

## 2019-12-15 MED ORDER — SODIUM CHLORIDE 0.9% IV SOLUTION: Freq: Once | INTRAVENOUS | Status: DC

## 2019-12-15 MED ORDER — VITAMIN K1 10 MG/ML IJ SOLN
5.0000 mg | Freq: Once | INTRAVENOUS | Status: DC
  Filled 2019-12-15: qty 0.5

## 2019-12-15 MED ORDER — FENTANYL CITRATE (PF) 100 MCG/2ML IJ SOLN
INTRAMUSCULAR | Status: AC
  Filled 2019-12-15: qty 4

## 2019-12-15 MED ORDER — FENTANYL CITRATE (PF) 100 MCG/2ML IJ SOLN
INTRAMUSCULAR | Status: DC | PRN
Start: 2019-12-15 — End: 2019-12-15
  Administered 2019-12-15 (×5): 25 ug via INTRAVENOUS

## 2019-12-15 MED ORDER — SODIUM CHLORIDE 0.9 % IV SOLN
2.0000 g | INTRAVENOUS | Status: AC
  Administered 2019-12-15 – 2019-12-18 (×4): 2 g via INTRAVENOUS
  Filled 2019-12-15 (×4): qty 20

## 2019-12-15 MED ORDER — MAGNESIUM SULFATE 2 GM/50ML IV SOLN
2.0000 g | Freq: Once | INTRAVENOUS | Status: AC
  Administered 2019-12-15: 2 g via INTRAVENOUS
  Filled 2019-12-15: qty 50

## 2019-12-15 MED ORDER — ALBUMIN HUMAN 25 % IV SOLN
12.5000 g | Freq: Once | INTRAVENOUS | Status: AC
  Administered 2019-12-15: 12.5 g via INTRAVENOUS
  Filled 2019-12-15: qty 50

## 2019-12-15 SURGICAL SUPPLY — 14 items

## 2019-12-15 NOTE — Progress Notes (Addendum)
eLink Physician-Brief Progress Note Patient Name: ONYEKACHI GATHRIGHT DOB: Jun 26, 1975 MRN: [id number]   Date of Service  12/15/2019  HPI/Events of Note  Oliguria - No urine output for this shift. Bladder scan with 271 mL. No CVL or CVP. Last Albumin = 1.7. Last Hgb = 8.1.   eICU Interventions  Plan: 1. 25% Albumin 12.5 gm IV now.      Intervention Category Major Interventions: Other:  Lysle Dingwall 12/15/2019, 11:43 PM

## 2019-12-15 NOTE — Progress Notes (Signed)
Interventional Radiology consulted for possible TIPS procedure.  History, imaging and labs have been briefly reviewed.  Patient has been evaluated at [facility].  Recently admitted for hemorrhagic shock from upper GI bleeding with history of decompensated alcoholic cirrhosis. Patient just underwent upper endoscopy with variceal banding by Dr. Watt Climes and there is concern that the patient may re-bleed.  Unfortunately, patient's labs are very abnormal with T.bilirubin of 6.9 and INR of 2.9.  Currently the MELD is 28.  Patient is a poor TIPS candidate based on current MELD score.  Plan for formal consult tomorrow and will re-assess labs and patient condition.

## 2019-12-15 NOTE — Progress Notes (Signed)
eLink Physician-Brief Progress Note Patient Name: Joshua Hubbard DOB: 02/01/1975 MRN: [id number]   Date of Service  12/15/2019  HPI/Events of Note  Coagulopathy - d/t cirrhosis. INR = 2.8. FFP 4 units already given after INR result. Request for Ammonia level in AM.  eICU Interventions  Plan: 1. Repeat PT/INR now.  2. Ammonia level at 5 AM.      Intervention Category Major Interventions: Other:  Lysle Dingwall 12/15/2019, 12:14 AM

## 2019-12-15 NOTE — Progress Notes (Signed)
Patient's endoscopic findings discussed with his father and ICU team and I also discussed his case with interventional radiology who will see the patient and be on standby if TIPS is needed which I believe would be the next step if he continues to have signs of bleeding otherwise if bleeding is controlled might consider a follow-up EGD in a few days to reevaluate his fundus and look for more traditional gastric varices since he had too much blood and clots in the fundus to be sure

## 2019-12-15 NOTE — Progress Notes (Signed)
Total of 2 units PRBCs given, 2 plasma, 1 platelet. 1unit in progress. Lab unable to obtain lactic and ammonia despite multiple sticks.

## 2019-12-15 NOTE — Progress Notes (Addendum)
eLink Physician-Brief Progress Note Patient Name: ANOTHY BUFANO DOB: 27-Nov-1975 MRN: [id number]   Date of Service  12/15/2019  HPI/Events of Note  Patient has had another bloody BM with clots. INR = 4.0. Still is on 2nd of 4 units FFP ordered earlier.   eICU Interventions  Plan: 1. Vitamin K 5 mg IV now. 2. Repeat PT/INR at 11 AM.     Intervention Category Major Interventions: Hemorrhage - evaluation and management;Other:  Lysle Dingwall 12/15/2019, 4:53 AM

## 2019-12-15 NOTE — Progress Notes (Addendum)
NAME:  [facility], MRN:  [id number], DOB:  1975-05-18, LOS: 1 ADMISSION DATE:  12/14/2019, CONSULTATION DATE:  12/14/2019 REFERRING MD:  Zettie Cooley, FPTS, CHIEF COMPLAINT:  Hematemesis   Brief History   44 yo male with hx of ETOH presented to [facility] with nausea, vomiting and diarrhea for 1 day prior to admission.  Has hx of cirrhosis with esophageal varices.  Found to have elevated ETOH level (23), ammonia (62), and lactic acid (5.7).  Developed hematemesis and progressive anemia causing hypotension.  He was transferred to ICU and PCCM assumed care.  History of present illness   Pt confused and not able to provide additional history.  Past Medical History  HTN, ETOH with cirrhosis, esophageal varices, Chronic opiate use, Chronic benzo use, Depression  Significant Hospital Events   11/20 Admit to FPTS, transfer to ICU  Consults:  [facility]  Procedures:    Significant Diagnostic Tests:  CT abd/pelvis 11/20 >> large esophageal varices, changes of cirrhosis, multiple gallstones, distended GB, splenomegaly 15.9 cm, mod ascites  Micro Data:  COVID/flu 11/20 >> negative MRSA PCR 11/20 >> negative Blood 11/20 >>  Antimicrobials:    Interim history/subjective:  Ongoing melena this morning off pressors. Has received 3 units prbc, 2 units FFP and 1 unit of platlets so far. He is delirious.   Objective   Blood pressure (!) 101/58, pulse (!) 108, temperature 99.7 F (37.6 C), resp. rate (!) 28, height 5\' 9"  (1.753 m), weight 74.8 kg, SpO2 100 %.        Intake/Output Summary (Last 24 hours) at 12/15/2019 5397 Last data filed at 12/15/2019 0600 Gross per 24 hour  Intake 1235.1 ml  Output --  Net 1235.1 ml   Filed Weights   12/14/19 0914  Weight: 74.8 kg    Examination:  General - jaundiced, pale Eyes - pupils reactive ENT - no sinus tenderness, no stridor Cardiac - regular, tachycardic Chest - equal breath sounds b/l, no wheezing or rales Abdomen - soft, mild  distention, non tender, decreased bowel sounds Extremities - decreased muscle bulk, thin extremities Skin - no rashes Neuro - moves extremities, confused   Resolved Hospital Problem list     Assessment & Plan:   Hemorrhagic shock in setting of Upper GI bleeding with hx of decompensated alcoholic cirrhosis, ascites, and esophageal varices. - trend Hgb. He is s/p 3 units prbc, 2 units FFP, 1 unit of platelets. Needs repeat labs. Has been a hard stick. Will place introducer for additional access.  - shock is resolved currently off pressors. However needs repeat labs PT/INR, CBC. - ceftriaxone for SBP ppx - continue octreotide, protonix - GI consulted. Plan for EGD at bedside this am.  - hold outpt lasix, aldactone, inderal - f/u LFTs  Acute blood loss anemia from GI bleeding. Coagulopathy in setting of cirrhosis. - management as above  Acute metabolic encephalopathy with concern for developing delirium tremens. Suspect hepatic encephalopathy Chronic opiate/benzo use. - CIWA q4h with prn ativan for CIWA > 8 - might need precedex - may need to start lactulose when able - oral vs rectal depending on EGD - hold outpt wellbutrin, klonopin, chronulac, oxycontin  Lactic acidosis in setting of hemorrhagic shock and liver disease. - f/u lactic acid - optimize hemodynamics  Hypervolemic Hyponatremia. - stop IVF - suspect hypervolemia in the setting of cirrhosis - f/u BMET  Hyperglycemia - check HbA1C - SSI  Best practice:  Diet: NPO DVT prophylaxis: SCDs GI prophylaxis: Protonix Mobility:  bed rest Code Status: full Disposition: ICU  The patient is critically ill with multiple organ systems failure and requires high complexity decision making for assessment and support, frequent evaluation and titration of therapies, application of advanced monitoring technologies and extensive interpretation of multiple databases.   Critical Care Time devoted to patient care services  described in this note is 45 minutes. This time reflects time of care of this [place] . This critical care time does not reflect separately billable procedures or procedure time, teaching time or supervisory time of PA/NP/Med student/Med Resident etc but could involve care discussion time.  [facility] 12/15/2019 8:12 AM  Pager: [phone] After hours pager: [phone]   Labs   CBC: Recent Labs  Lab 12/14/19 0928 12/14/19 1654 12/15/19 0310  WBC 10.8* 14.6* 21.9*  HGB 7.6* 5.9* 6.7*  HCT 23.9* 18.4* 20.9*  MCV 73.3* 75.1* 85.3  PLT 201 155 76*    Basic Metabolic Panel: Recent Labs  Lab 12/14/19 0928 12/15/19 0310  NA 128* 133*  K 4.3 4.5  CL 94* 102  CO2 23 16*  GLUCOSE 164* 166*  BUN 31* 37*  CREATININE 0.67 1.03  CALCIUM 8.1* 8.1*  MG  --  1.2*  PHOS  --  5.3*   GFR: Estimated Creatinine Clearance: 90.6 mL/min (by C-G formula based on SCr of 1.03 mg/dL). Recent Labs  Lab 12/14/19 0310 12/14/19 0928 12/14/19 1314 12/14/19 1654 12/15/19 0310  WBC  --  10.8*  --  14.6* 21.9*  LATICACIDVEN >11.0*  --  5.7* 8.0*  --     Liver Function Tests: Recent Labs  Lab 12/14/19 0928 12/15/19 0310  AST 90* 95*  ALT 45* 47*  ALKPHOS 72 51  BILITOT 6.2* 6.9*  PROT 6.8 4.8*  ALBUMIN 2.3* 1.7*   Recent Labs  Lab 12/14/19 0928  LIPASE 49   Recent Labs  Lab 12/14/19 1121  AMMONIA 62*    ABG No results found for: PHART, PCO2ART, PO2ART, HCO3, TCO2, ACIDBASEDEF, O2SAT   Coagulation Profile: Recent Labs  Lab 12/14/19 1654 12/15/19 0019  INR 2.8* 4.0*    Cardiac Enzymes: No results for input(s): CKTOTAL, CKMB, CKMBINDEX, TROPONINI in the last 168 hours.  HbA1C: Hgb A1c MFr Bld  Date/Time Value Ref Range Status  12/15/2019 03:10 AM 5.0 4.8 - 5.6 % Final    Comment:    (NOTE) Pre diabetes:          5.7%-6.4%  Diabetes:              >6.4%  Glycemic control for   <7.0% adults with  diabetes     CBG: Recent Labs  Lab 12/14/19 2013 12/14/19 2344 12/15/19 0313  GLUCAP 126* 127* 147*    Review of Systems:   Unable to obtain  Past Medical History  He,  has a past medical history of Arthritis, Blood transfusion without reported diagnosis, Cirrhosis ([place]), Hypertension, PONV (postoperative nausea and vomiting), and Tears of meniscus and ACL of left knee (03/19/2018).   Surgical History    Past Surgical History:  Procedure Laterality Date  . ARTHROSCOPIC REPAIR ACL     x2  . BIOPSY  10/05/2018   Procedure: BIOPSY;  Surgeon: Ronald Lobo, MD;  Location: [facility];  Service: Endoscopy;;  . ESOPHAGEAL BANDING  02/12/2019   Procedure: ESOPHAGEAL BANDING;  Surgeon: [name], MD;  Location: [facility];  Service: Gastroenterology;;  . ESOPHAGOGASTRODUODENOSCOPY (EGD) WITH PROPOFOL N/A 10/05/2018  Procedure: ESOPHAGOGASTRODUODENOSCOPY (EGD) WITH PROPOFOL;  Surgeon: Ronald Lobo, MD;  Location: [place];  Service: Endoscopy;  Laterality: N/A;  . ESOPHAGOGASTRODUODENOSCOPY (EGD) WITH PROPOFOL N/A 02/12/2019   Procedure: ESOPHAGOGASTRODUODENOSCOPY (EGD) WITH PROPOFOL;  Surgeon: [name], MD;  Location: [facility];  Service: Gastroenterology;  Laterality: N/A;  . HERNIA REPAIR     x3  . KNEE ARTHROSCOPY WITH ANTERIOR CRUCIATE LIGAMENT (ACL) REPAIR WITH HAMSTRING GRAFT Left 03/20/2018   Procedure: KNEE ARTHROSCOPY WITH REVESION ANTERIOR CRUCIATE LIGAMENT (ACL) REPAIR WITH  HAMSTRING ALLOGRAFT,;  Surgeon: Elsie Saas, MD;  Location: [place];  Service: Orthopedics;  Laterality: Left;  . KNEE ARTHROSCOPY WITH LATERAL MENISECTOMY Left 03/20/2018   Procedure: KNEE ARTHROSCOPY WITH LATERAL MENISECTOMY;  Surgeon: Elsie Saas, MD;  Location: [place];  Service: Orthopedics;  Laterality: Left;  . KNEE ARTHROSCOPY WITH MEDIAL MENISECTOMY Left 03/20/2018   Procedure: KNEE ARTHROSCOPY WITH MEDIAL MENISECTOMY;  Surgeon:  Elsie Saas, MD;  Location: [place];  Service: Orthopedics;  Laterality: Left;     Social History   reports that he has never smoked. He has never used smokeless tobacco. He reports current alcohol use of about 42.0 standard drinks of alcohol per week. He reports that he does not use drugs.   Family History   His family history includes Coronary artery disease in his father; Hyperlipidemia in his father; Osteoarthritis in his mother.   Allergies No Known Allergies   Home Medications  Prior to Admission medications   Medication Sig Start Date End Date Taking? Authorizing Provider  acamprosate (CAMPRAL) 333 MG tablet Take 2 tablets (666 mg total) by mouth 3 (three) times daily. 10/07/19   [name], MD  buPROPion (WELLBUTRIN XL) 150 MG 24 hr tablet TAKE 1 TABLET BY MOUTH EVERY DAY 09/23/19   [name], MD  clonazePAM (KLONOPIN) 1 MG tablet TAKE 1 TABLET BY MOUTH AT BEDTIME 03/26/19   [name], MD  furosemide (LASIX) 20 MG tablet  11/15/19   [provider]  lactulose (CHRONULAC) 10 GM/15ML solution Take 20 g by mouth daily. 11/25/19   [provider]  Multiple Vitamin (MULTIVITAMIN WITH MINERALS) TABS tablet Take 1 tablet by mouth daily.    [provider]  oxyCODONE (OXYCONTIN) 20 mg 12 hr tablet Take one or two by mouth every 12 hours needed for pain 11/19/19   [name], MD  pantoprazole (PROTONIX) 40 MG tablet Take 1 tablet (40 mg total) by mouth daily. 02/15/19 03/17/19  Cherylann Ratel A, DO  propranolol (INDERAL) 20 MG tablet Take 1 tablet (20 mg total) by mouth 2 (two) times daily. 10/16/19   [name], MD  spironolactone (ALDACTONE) 50 MG tablet Take one a day for a week then start one bid 10/11/19   [name], MD

## 2019-12-15 NOTE — Op Note (Signed)
[facility] Patient Name: [facility] Procedure Date : 12/15/2019 MRN: [id number] Attending MD: Clarene Essex , MD Date of Birth: [birth year] CSN: [id number] Age: 44 Admit Type: Inpatient Procedure:                Upper GI endoscopy Indications:              Melena, Suspected upper gastrointestinal bleeding                            in patient with cirrhosis and history of varices Providers:                Clarene Essex, MD Referring MD:              Medicines:                Fentanyl 125 micrograms IV, Midazolam 10 mg IV,                            Diphenhydramine 25 mg IV Complications:            No immediate complications. Estimated Blood Loss:     Estimated blood loss: none. Procedure:                Pre-Anesthesia Assessment:                           - Prior to the procedure, a History and Physical                            was performed, and patient medications and                            allergies were reviewed. The patient's tolerance of                            previous anesthesia was also reviewed. The risks                            and benefits of the procedure and the sedation                            options and risks were discussed with the patient.                            All questions were answered, and informed consent                            was obtained. Prior Anticoagulants: The patient has                            taken no previous anticoagulant or antiplatelet                            agents. ASA Grade Assessment: III - A patient with  severe systemic disease. After reviewing the risks                            and benefits, the patient was deemed in                            satisfactory condition to undergo the procedure.                           After obtaining informed consent, the endoscope was                            passed under direct vision. Throughout the                             procedure, the patient's blood pressure, pulse, and                            oxygen saturations were monitored continuously. The                            [id number]-H190 ([id number]) Olympus gastroscope was                            introduced through the mouth, and advanced to the                            third part of duodenum. The upper GI endoscopy was                            somewhat difficult due to excessive bleeding. The                            patient tolerated the procedure fairly well. Scope In: Scope Out: Findings:      Grade I varices were found in the middle third of the esophagus and in       the lower third of the esophagus.      A small hiatal hernia was present.      Varices with oozing blood were found at the gastroesophageal junction       primarily in the hiatal hernia pouch. There were stigmata of recent       bleeding. They were medium in largest diameter. Five bands were       successfully placed in the customary fashion. Bleeding had seen to stop       at the end of the procedure.      Clotted blood was found in the gastric fundus.      The greater curvature of the stomach, lesser curvature of the stomach,       incisura, gastric antrum, prepyloric region of the stomach and pylorus       were normal.      The duodenal bulb, first portion of the duodenum, second portion of the       duodenum and third portion of the duodenum were normal.      The exam was otherwise without abnormality. Impression:               -  Grade I esophageal varices.                           - Small hiatal hernia.                           - Gastric varices at the GE junction in hiatal                            hernia pouch, oozing blood. Banded.                           - Clotted blood in the gastric fundus.                           - Normal greater curvature of the stomach, lesser                            curvature of the stomach, incisura, antrum,                             prepyloric region of the stomach and pylorus.                           - Normal duodenal bulb, first portion of the                            duodenum, second portion of the duodenum and third                            portion of the duodenum.                           - The examination was otherwise normal.                           - No specimens collected. Recommendation:           - NPO today.                           - Continue present medications. And care per ICU                            team and have discussed case with the ICU team                           - Return to GI clinic PRN.                           - Telephone GI clinic if symptomatic PRN. I have                            discussed the case with interventional radiology  who will be on standby if TIPS is needed                           - Repeat upper endoscopy at appointment to be                            scheduled to evaluate the response to therapy or to                            reevaluate fundus to see if gastric varices                            presents and if he does well can wait till                            follow-up banding procedure in few weeks. Procedure Code(s):        --- Professional ---                           [id number], Esophagogastroduodenoscopy, flexible,                            transoral; with band ligation of esophageal/gastric                            varices Diagnosis Code(s):        --- Professional ---                           I85.00, Esophageal varices without bleeding                           K44.9, Diaphragmatic hernia without obstruction or                            gangrene                           I86.4, Gastric varices                           K92.2, Gastrointestinal hemorrhage, unspecified                           K92.1, Melena (includes Hematochezia) CPT copyright 2019 [employer]. All rights reserved. The codes  documented in this report are preliminary and upon coder review may  be revised to meet current compliance requirements. Clarene Essex, MD 12/15/2019 12:25:50 PM This report has been signed electronically. Number of Addenda: 0

## 2019-12-15 NOTE — Consult Note (Signed)
Reason for Consult: GI bleeding in patient with cirrhosis Referring Physician: Hospital team  Joshua Hubbard is an 44 y.o. male.  HPI: Patient seen and examined and discussed with the hospital team as well as his hospital computer chart in our office computer chart reviewed and he has switched his GI care to [facility] however he presented to the emergency room with signs of bleeding and had increased tachycardia and altered mental status and was transferred to the ICU and his previous endoscopy was reviewed as was his [facility] work-up and there was a question of hepatocellular carcinoma although his alpha-fetoprotein was normal and his current CT did not show an obvious tumor and currently we are unable to obtain a history from him and the patient has received multiple transfusions including FFP packed cells and platelets Past Medical History:  Diagnosis Date  . Arthritis   . Blood transfusion without reported diagnosis    recevied 4 units PRBC and FFP during hospitalization from 1/17-1/22/21  . Cirrhosis ([place])   . Hypertension   . PONV (postoperative nausea and vomiting)   . Tears of meniscus and ACL of left knee 03/19/2018    Past Surgical History:  Procedure Laterality Date  . ARTHROSCOPIC REPAIR ACL     x2  . BIOPSY  10/05/2018   Procedure: BIOPSY;  Surgeon: Ronald Lobo, MD;  Location: [place];  Service: Endoscopy;;  . ESOPHAGEAL BANDING  02/12/2019   Procedure: ESOPHAGEAL BANDING;  Surgeon: [name], MD;  Location: [place];  Service: Gastroenterology;;  . ESOPHAGOGASTRODUODENOSCOPY (EGD) WITH PROPOFOL N/A 10/05/2018   Procedure: ESOPHAGOGASTRODUODENOSCOPY (EGD) WITH PROPOFOL;  Surgeon: Ronald Lobo, MD;  Location: [place];  Service: Endoscopy;  Laterality: N/A;  . ESOPHAGOGASTRODUODENOSCOPY (EGD) WITH PROPOFOL N/A 02/12/2019   Procedure: ESOPHAGOGASTRODUODENOSCOPY (EGD) WITH PROPOFOL;  Surgeon: [name], MD;  [facility];  Service:  Gastroenterology;  Laterality: N/A;  . HERNIA REPAIR     x3  . KNEE ARTHROSCOPY WITH ANTERIOR CRUCIATE LIGAMENT (ACL) REPAIR WITH HAMSTRING GRAFT Left 03/20/2018   Procedure: KNEE ARTHROSCOPY WITH REVESION ANTERIOR CRUCIATE LIGAMENT (ACL) REPAIR WITH  HAMSTRING ALLOGRAFT,;  Surgeon: Elsie Saas, MD;  Location: [place];  Service: Orthopedics;  Laterality: Left;  . KNEE ARTHROSCOPY WITH LATERAL MENISECTOMY Left 03/20/2018   Procedure: KNEE ARTHROSCOPY WITH LATERAL MENISECTOMY;  Surgeon: Elsie Saas, MD;  Location: [place];  Service: Orthopedics;  Laterality: Left;  . KNEE ARTHROSCOPY WITH MEDIAL MENISECTOMY Left 03/20/2018   Procedure: KNEE ARTHROSCOPY WITH MEDIAL MENISECTOMY;  Surgeon: Elsie Saas, MD;  Location: [place];  Service: Orthopedics;  Laterality: Left;    Family History  Problem Relation Age of Onset  . Osteoarthritis Mother   . Coronary artery disease Father   . Hyperlipidemia Father     Social History:  reports that he has never smoked. He has never used smokeless tobacco. He reports current alcohol use of about 42.0 standard drinks of alcohol per week. He reports that he does not use drugs.  Allergies: No Known Allergies  Medications: I have reviewed the patient's current medications.  Results for orders placed or performed during the hospital encounter of 12/14/19 (from the past 48 hour(s))  Lactic acid, plasma     Status: Abnormal   Collection Time: 12/14/19  3:10 AM  Result Value Ref Range   Lactic Acid, Venous >11.0 (HH) 0.5 - 1.9 mmol/L    Comment: CRITICAL VALUE NOTED.  VALUE IS CONSISTENT WITH PREVIOUSLY REPORTED AND CALLED VALUE. Performed at  [facility], [address] [place], [place] [address]   Lipase, blood     Status: None   Collection Time: 12/14/19  9:28 AM  Result Value Ref Range   Lipase 49 11 - 51 U/L    Comment: Performed at [facility], [address] [place], [place] [address]  Comprehensive metabolic panel     Status: Abnormal   Collection Time: 12/14/19  9:28 AM  Result Value Ref Range   Sodium 128 (L) 135 - 145 mmol/L   Potassium 4.3 3.5 - 5.1 mmol/L   Chloride 94 (L) 98 - 111 mmol/L   CO2 23 22 - 32 mmol/L   Glucose, Bld 164 (H) 70 - 99 mg/dL    Comment: Glucose reference range applies only to samples taken after fasting for at least 8 hours.   BUN 31 (H) 6 - 20 mg/dL   Creatinine, Ser 0.67 0.61 - 1.24 mg/dL   Calcium 8.1 (L) 8.9 - 10.3 mg/dL   Total Protein 6.8 6.5 - 8.1 g/dL   Albumin 2.3 (L) 3.5 - 5.0 g/dL   AST 90 (H) 15 - 41 U/L   ALT 45 (H) 0 - 44 U/L   Alkaline Phosphatase 72 38 - 126 U/L   Total Bilirubin 6.2 (H) 0.3 - 1.2 mg/dL   GFR, Estimated >60 >60 mL/min    Comment: (NOTE) Calculated using the CKD-EPI Creatinine Equation (2021)    Anion gap 11 5 - 15    Comment: Performed at [facility], [address] [place], [place] [address]  CBC     Status: Abnormal   Collection Time: 12/14/19  9:28 AM  Result Value Ref Range   WBC 10.8 (H) 4.0 - 10.5 K/uL   RBC 3.26 (L) 4.22 - 5.81 MIL/uL   Hemoglobin 7.6 (L) 13.0 - 17.0 g/dL    Comment: Reticulocyte Hemoglobin testing may be clinically indicated, consider ordering this additional test [id number]    HCT 23.9 (L) 39 - 52 %   MCV 73.3 (L) 80.0 - 100.0 fL   MCH 23.3 (L) 26.0 - 34.0 pg   MCHC 31.8 30.0 - 36.0 g/dL   RDW 24.3 (H) 11.5 - 15.5 %   Platelets 201 150 - 400 K/uL    Comment: REPEATED TO VERIFY   nRBC 0.0 0.0 - 0.2 %    Comment: Performed at [facility], [address] [place], [place] [address]  Ammonia     Status: Abnormal   Collection Time: 12/14/19 11:21 AM  Result Value Ref Range   Ammonia 62 (H) 9 - 35 umol/L    Comment: Performed at [facility], [address] [place], [place] [address]  Ethanol     Status: Abnormal   Collection Time: 12/14/19  1:14 PM  Result Value Ref Range   Alcohol, Ethyl (B) 23 (H) <10 mg/dL     Comment: (NOTE) Lowest detectable limit for serum alcohol is 10 mg/dL.  For medical purposes only. Performed at [facility], [address] [place], [place] [address]   Lactic acid, plasma     Status: Abnormal   Collection Time: 12/14/19  1:14 PM  Result Value Ref Range   Lactic Acid, Venous 5.7 (HH) 0.5 - 1.9 mmol/L    Comment: CRITICAL RESULT CALLED TO, READ BACK BY AND VERIFIED WITHJosph Macho RN [phone] PHILLIPS C Performed at [facility], [address] [place], [place] [address]   Blood culture (routine x 2)     Status: None (Preliminary result)   Collection Time: 12/14/19  1:47 PM   Specimen: BLOOD RIGHT HAND  Result Value Ref Range   Specimen Description      BLOOD RIGHT HAND Performed at [facility], [address] [place], [place] [address]    Special Requests      BOTTLES DRAWN AEROBIC ONLY Blood Culture results may not be optimal due to an inadequate volume of blood received in culture bottles Performed at [facility] [address] [place], [place] [address]    Culture PENDING    Report Status PENDING   Resp Panel by RT-PCR (Flu A&B, Covid)     Status: None   Collection Time: 12/14/19  1:47 PM  Result Value Ref Range   SARS Coronavirus 2 by RT PCR NEGATIVE NEGATIVE    Comment: (NOTE) SARS-CoV-2 target nucleic acids are NOT DETECTED.  The SARS-CoV-2 RNA is generally detectable in upper respiratory specimens during the acute phase of infection. The lowest concentration of SARS-CoV-2 viral copies this assay can detect is 138 copies/mL. A negative result does not preclude SARS-Cov-2 infection and should not be used as the sole basis for treatment or other patient management decisions. A negative result may occur with  improper specimen collection/handling, submission of specimen other than nasopharyngeal swab, presence of viral mutation(s) within the areas targeted by this assay, and inadequate number of  viral copies(<138 copies/mL). A negative result must be combined with clinical observations, patient history, and epidemiological information. The expected result is Negative.  Fact Sheet for Patients:  EntrepreneurPulse.com.au  Fact Sheet for Healthcare Providers:  IncredibleEmployment.be  This test is no t yet approved or cleared by the [place] FDA and  has been authorized for detection and/or diagnosis of SARS-CoV-2 by FDA under an Emergency Use Authorization (EUA). This EUA will remain  in effect (meaning this test can be used) for the duration of the COVID-19 declaration under Section 564(b)(1) of the Act, 21 U.S.C.section 360bbb-3(b)(1), unless the authorization is terminated  or revoked sooner.       Influenza A by PCR NEGATIVE NEGATIVE   Influenza B by PCR NEGATIVE NEGATIVE    Comment: (NOTE) The Xpert Xpress SARS-CoV-2/FLU/RSV plus assay is intended as an aid in the diagnosis of influenza from Nasopharyngeal swab specimens and should not be used as a sole basis for treatment. Nasal washings and aspirates are unacceptable for Xpert Xpress SARS-CoV-2/FLU/RSV testing.  Fact Sheet for Patients: EntrepreneurPulse.com.au  Fact Sheet for Healthcare Providers: IncredibleEmployment.be  This test is not yet approved or cleared by the [place] FDA and has been authorized for detection and/or diagnosis of SARS-CoV-2 by FDA under an Emergency Use Authorization (EUA). This EUA will remain in effect (meaning this test can be used) for the duration of the COVID-19 declaration under Section 564(b)(1) of the Act, 21 U.S.C. section 360bbb-3(b)(1), unless the authorization is terminated or revoked.  Performed at [facility], [address] [place], [place] [address]   MRSA PCR Screening     Status: None   Collection Time: 12/14/19  4:15 PM   Specimen: Nasopharyngeal  Result Value Ref  Range   MRSA by PCR NEGATIVE NEGATIVE    Comment:        The GeneXpert MRSA Assay (FDA approved for NASAL specimens only), is one component of a comprehensive MRSA colonization surveillance program.  It is not intended to diagnose MRSA infection nor to guide or monitor treatment for MRSA infections. Performed at [facility], [place] [address] [place], [place] [address]   CBC     Status: Abnormal   Collection Time: 12/14/19  4:54 PM  Result Value Ref Range   WBC 14.6 (H) 4.0 - 10.5 K/uL   RBC 2.45 (L) 4.22 - 5.81 MIL/uL   Hemoglobin 5.9 (LL) 13.0 - 17.0 g/dL    Comment: REPEATED TO VERIFY Reticulocyte Hemoglobin testing may be clinically indicated, consider ordering this additional test [id number] THIS CRITICAL RESULT HAS VERIFIED AND BEEN CALLED TO D JOHNSON RN BY BILLEE WYLIE ON 11 20 2021 AT 5027, AND HAS BEEN READ BACK.     HCT 18.4 (L) 39 - 52 %   MCV 75.1 (L) 80.0 - 100.0 fL   MCH 24.1 (L) 26.0 - 34.0 pg   MCHC 32.1 30.0 - 36.0 g/dL   RDW 24.3 (H) 11.5 - 15.5 %   Platelets 155 150 - 400 K/uL    Comment: REPEATED TO VERIFY   nRBC 0.0 0.0 - 0.2 %    Comment: Performed at [facility] [address], [place], [place] [address]  Protime-INR     Status: Abnormal   Collection Time: 12/14/19  4:54 PM  Result Value Ref Range   Prothrombin Time 28.4 (H) 11.4 - 15.2 seconds   INR 2.8 (H) 0.8 - 1.2    Comment: (NOTE) INR goal varies based on device and disease states. Performed at [facility], [place] [address], [place], [place] [address]   Lactic acid, plasma     Status: Abnormal   Collection Time: 12/14/19  4:54 PM  Result Value Ref Range   Lactic Acid, Venous 8.0 (HH) 0.5 - 1.9 mmol/L    Comment: CRITICAL RESULT CALLED TO, READ BACK BY AND VERIFIED WITH: B.FITTS RN @ [phone] 12/14/2019 BY C.EDENS Performed at [facility], [place] [address], [place], [place] [address]   Type and screen [place]     Status: None (Preliminary result)    Collection Time: 12/14/19  6:25 PM  Result Value Ref Range   ABO/RH(D) AB POS    Antibody Screen NEG    Sample Expiration 12/17/2019,2359    Unit Number [id number]    Blood Component Type RBC LR PHER2    Unit division 00    Status of Unit ISSUED    Transfusion Status OK TO TRANSFUSE    Crossmatch Result Compatible    Unit Number [id number]    Blood Component Type RBC LR PHER1    Unit division 00    Status of Unit ISSUED    Transfusion Status OK TO TRANSFUSE    Crossmatch Result Compatible    Unit Number [id number]    Blood Component Type RED CELLS,LR    Unit division 00    Status of Unit ISSUED    Transfusion Status OK TO TRANSFUSE    Crossmatch Result      Compatible Performed at [facility], [place] [address], [place], [place] [address]    Unit Number [id number]    Blood Component Type RED CELLS,LR    Unit division 00    Status of Unit ISSUED    Transfusion Status OK TO TRANSFUSE    Crossmatch Result Compatible   Prepare fresh frozen plasma     Status: None (Preliminary result)   Collection Time: 12/14/19  7:04 PM  Result Value Ref Range   Unit Number [id number]    Blood Component Type THW PLS APHR    Unit division B0    Status of Unit ISSUED    Transfusion Status OK TO TRANSFUSE    Unit Number [id number]    Blood Component Type THW PLS APHR    Unit division B0    Status of Unit ISSUED    Transfusion Status OK TO TRANSFUSE    Unit Number [id number]    Blood Component Type THW PLS APHR    Unit division A0    Status of Unit ISSUED    Transfusion Status      OK TO TRANSFUSE Performed at [facility], [place] [address] [place], [place] [address]    Unit Number [id number]    Blood Component Type THW PLS APHR    Unit division A0    Status of Unit ISSUED    Transfusion Status OK TO TRANSFUSE   Prepare Pheresed Platelets     Status: None (Preliminary result)   Collection Time: 12/14/19  7:05 PM  Result Value Ref Range   Unit  Number [id number]    Blood Component Type PLTP1 PSORALEN TREATED    Unit division 00    Status of Unit REL FROM [facility]    Transfusion Status OK TO TRANSFUSE    Unit Number [id number]    Blood Component Type PLTP2 PSORALEN TREATED    Unit division 00    Status of Unit ISSUED    Transfusion Status      OK TO TRANSFUSE Performed at [facility], [place] [address] [place], [place] [address]   Glucose, capillary     Status: Abnormal   Collection Time: 12/14/19  8:13 PM  Result Value Ref Range   Glucose-Capillary 126 (H) 70 - 99 mg/dL    Comment: Glucose reference range applies only to samples taken after fasting for at least 8 hours.  Glucose, capillary     Status: Abnormal   Collection Time: 12/14/19 11:44 PM  Result Value Ref Range   Glucose-Capillary 127 (H) 70 - 99 mg/dL    Comment: Glucose reference range applies only to samples taken after fasting for at least 8 hours.  Protime-INR     Status: Abnormal   Collection Time: 12/15/19 12:19 AM  Result Value Ref Range   Prothrombin Time 37.7 (H) 11.4 - 15.2 seconds   INR 4.0 (H) 0.8 - 1.2    Comment: (NOTE) INR goal varies based on device and disease states. Performed at [facility], [place] [address], [place] [address]   CBC     Status: Abnormal   Collection Time: 12/15/19  3:10 AM  Result Value Ref Range   WBC 21.9 (H) 4.0 - 10.5 K/uL   RBC 2.45 (L) 4.22 - 5.81 MIL/uL   Hemoglobin 6.7 (LL) 13.0 - 17.0 g/dL    Comment: REPEATED TO VERIFY POST TRANSFUSION SPECIMEN CRITICAL VALUE NOTED.  VALUE IS CONSISTENT WITH PREVIOUSLY REPORTED AND CALLED VALUE.    HCT 20.9 (L) 39 - 52 %   MCV 85.3 80.0 - 100.0 fL    Comment: REPEATED TO VERIFY DELTA CHECK NOTED    MCH 27.3 26.0 - 34.0 pg   MCHC 32.1 30.0 - 36.0 g/dL   RDW 22.5 (H) 11.5 - 15.5 %   Platelets 76 (L) 150 - 400 K/uL    Comment: REPEATED TO VERIFY PLATELET COUNT CONFIRMED BY SMEAR Immature Platelet Fraction may be  clinically indicated,  consider ordering this additional test [id number]    nRBC 0.0 0.0 - 0.2 %    Comment: Performed at [facility], [place] [address] [place], [place] [address]  Comprehensive metabolic panel     Status: Abnormal   Collection Time: 12/15/19  3:10 AM  Result Value Ref Range   Sodium 133 (L) 135 - 145 mmol/L   Potassium 4.5 3.5 - 5.1 mmol/L   Chloride 102 98 - 111 mmol/L   CO2 16 (L) 22 - 32 mmol/L   Glucose, Bld 166 (H) 70 - 99 mg/dL    Comment: Glucose reference range applies only to samples taken after fasting for at least 8 hours.   BUN 37 (H) 6 - 20 mg/dL   Creatinine, Ser 1.03 0.61 - 1.24 mg/dL   Calcium 8.1 (L) 8.9 - 10.3 mg/dL   Total Protein 4.8 (L) 6.5 - 8.1 g/dL   Albumin 1.7 (L) 3.5 - 5.0 g/dL   AST 95 (H) 15 - 41 U/L   ALT 47 (H) 0 - 44 U/L   Alkaline Phosphatase 51 38 - 126 U/L   Total Bilirubin 6.9 (H) 0.3 - 1.2 mg/dL   GFR, Estimated >60 >60 mL/min    Comment: (NOTE) Calculated using the CKD-EPI Creatinine Equation (2021)    Anion gap 15 5 - 15    Comment: Performed at [facility] [address] [place], [place] [address]  Magnesium     Status: Abnormal   Collection Time: 12/15/19  3:10 AM  Result Value Ref Range   Magnesium 1.2 (L) 1.7 - 2.4 mg/dL    Comment: Performed at [facility] [address] [place], [place] [address]  Phosphorus     Status: Abnormal   Collection Time: 12/15/19  3:10 AM  Result Value Ref Range   Phosphorus 5.3 (H) 2.5 - 4.6 mg/dL    Comment: Performed at [facility] [address] [place],  [address]  Hemoglobin A1c     Status: None   Collection Time: 12/15/19  3:10 AM  Result Value Ref Range   Hgb A1c MFr Bld 5.0 4.8 - 5.6 %    Comment: (NOTE) Pre diabetes:          5.7%-6.4%  Diabetes:              >6.4%  Glycemic control for   <7.0% adults with diabetes    Mean Plasma Glucose 96.8 mg/dL    Comment: Performed at [facility] [address], [place], [place] [address]  Glucose,  capillary     Status: Abnormal   Collection Time: 12/15/19  3:13 AM  Result Value Ref Range   Glucose-Capillary 147 (H) 70 - 99 mg/dL    Comment: Glucose reference range applies only to samples taken after fasting for at least 8 hours.  Prepare RBC (crossmatch)     Status: None   Collection Time: 12/15/19  5:49 AM  Result Value Ref Range   Order Confirmation      ORDER PROCESSED BY BLOOD BANK Performed at [facility], [address] [place], [place] [address]   Glucose, capillary     Status: Abnormal   Collection Time: 12/15/19  8:11 AM  Result Value Ref Range   Glucose-Capillary 159 (H) 70 - 99 mg/dL    Comment: Glucose reference range applies only to samples taken after fasting for at least 8 hours.    DG  Chest 2 View  Result Date: 12/14/2019 CLINICAL DATA:  Abdominal pain with vomiting EXAM: CHEST - 2 VIEW COMPARISON:  02/10/2019 FINDINGS: The heart size and mediastinal contours are within normal limits. Both lungs are clear. The visualized skeletal structures are unremarkable. IMPRESSION: No active cardiopulmonary disease. Electronically Signed   By: Jerilynn Mages.  Shick M.D.   On: 12/14/2019 15:05   CT Abdomen Pelvis W Contrast  Result Date: 12/14/2019 CLINICAL DATA:  Abdominal distension EXAM: CT ABDOMEN AND PELVIS WITH CONTRAST TECHNIQUE: Multidetector CT imaging of the abdomen and pelvis was performed using the standard protocol following bolus administration of intravenous contrast. CONTRAST:  170mL OMNIPAQUE IOHEXOL 300 MG/ML  SOLN COMPARISON:  10/04/2019 FINDINGS: Examination is generally limited by breath motion artifact. Lower chest: No acute abnormality. Large lower esophageal varices (series 2, image 17). Hepatobiliary: Cirrhotic morphology of the liver. Distended gallbladder containing multiple small gallstones. No biliary ductal dilatation. Pancreas: Unremarkable. No pancreatic ductal dilatation or surrounding inflammatory changes. Spleen: Splenomegaly, maximum coronal span  15.9 cm. Adrenals/Urinary Tract: Adrenal glands are unremarkable. Kidneys are normal, without renal calculi, solid lesion, or hydronephrosis. Bladder is unremarkable. Stomach/Bowel: Stomach is within normal limits. Appendix appears normal. No evidence of bowel wall thickening, distention, or inflammatory changes. Vascular/Lymphatic: Aortic atherosclerosis. Varices about the ventral abdomen. No enlarged abdominal or pelvic lymph nodes. Reproductive: No mass or other significant abnormality. Other: No abdominal wall hernia or abnormality. Moderate volume ascites throughout the abdomen and pelvis, increased compared to prior examination. Musculoskeletal: No acute or significant osseous findings. IMPRESSION: 1. Examination is generally limited by breath motion artifact. 2. Stigmata of cirrhosis and portal hypertension, including splenomegaly and moderate volume ascites, increased compared to prior examination. 3. Distended gallbladder containing multiple small gallstones. No biliary ductal dilatation. Aortic Atherosclerosis (ICD10-I70.0). Electronically Signed   By: Eddie Candle M.D.   On: 12/14/2019 15:11    Review of Systems unable to obtain Blood pressure (!) 101/58, pulse (!) 108, temperature 99.7 F (37.6 C), resp. rate (!) 28, height 5\' 9"  (1.753 m), weight 74.8 kg, SpO2 100 %. Physical Exam not responding obviously bleeding exam please see preassessment evaluation obvious ascites labs and x-rays reviewed  Assessment/Plan: Cirrhosis with GI bleeding in a patient with a history of varices Plan: We will obtain consent from the mother who is the power of attorney and proceed with urgent endoscopy this morning at the bedside with further work-up plans and recommendations pending those findings  [place] E 12/15/2019, 9:10 AM

## 2019-12-15 NOTE — Progress Notes (Signed)
eLink Physician-Brief Progress Note Patient Name: Joshua Hubbard DOB: [birth year] MRN: [id number]   Date of Service  12/15/2019  HPI/Events of Note  Multiple issues: 1. Mg++ level = 1.2 and Creatinine = 1.03 , 2. Anemia - Hgb = 6.7. Patient still passing blood with clots PR, and 3. Platelet count = 76 - Transfuse for platelets < 50 with ongoing bleeding.  eICU Interventions  Plan: 1. Replace Mg++. 2. Transfuse 2 units PRBC now.      Intervention Category Major Interventions: Electrolyte abnormality - evaluation and management;Other:  Lysle Dingwall 12/15/2019, 5:50 AM

## 2019-12-15 NOTE — Progress Notes (Signed)
Continuing to await 2000 ordered phlebotomy lab draw for patient. Phlebotomy aware of need of hemoglobin draw.  Lab aware as well.

## 2019-12-15 NOTE — Progress Notes (Signed)
Elink notified about additional large, bloody/blood clot filled bowel movement. Patient remains drowsy but is arousable to painful stimuli.  Pupils 71mm.  Low urination- in and out cath performed at 0200 following bladder scan with 700 ml urine in bladder. Patient became very agitated and aggressive at that time with staff and only briefly allowed in/out cath to be performed. 300 ml amber urine returned before patient removed catheter.

## 2019-12-16 ENCOUNTER — Encounter (HOSPITAL_COMMUNITY): Payer: Self-pay | Admitting: Gastroenterology

## 2019-12-16 ENCOUNTER — Inpatient Hospital Stay (HOSPITAL_COMMUNITY): Payer: 59

## 2019-12-16 DIAGNOSIS — K7469 Other cirrhosis of liver: Secondary | ICD-10-CM | POA: Diagnosis not present

## 2019-12-16 DIAGNOSIS — K921 Melena: Secondary | ICD-10-CM | POA: Diagnosis not present

## 2019-12-16 LAB — CBC
HCT: 22.8 % — ABNORMAL LOW (ref 39.0–52.0)
HCT: 21.1 % — ABNORMAL LOW (ref 39.0–52.0)
HCT: 19.7 % — ABNORMAL LOW (ref 39.0–52.0)
Hemoglobin: 7.8 g/dL — ABNORMAL LOW (ref 13.0–17.0)
Hemoglobin: 7.2 g/dL — ABNORMAL LOW (ref 13.0–17.0)
Hemoglobin: 6.6 g/dL — CL (ref 13.0–17.0)
MCH: 29 pg (ref 26.0–34.0)
MCH: 28.6 pg (ref 26.0–34.0)
MCH: 28.2 pg (ref 26.0–34.0)
MCHC: 34.2 g/dL (ref 30.0–36.0)
MCHC: 34.1 g/dL (ref 30.0–36.0)
MCHC: 33.5 g/dL (ref 30.0–36.0)
MCV: 84.8 fL (ref 80.0–100.0)
MCV: 83.7 fL (ref 80.0–100.0)
MCV: 84.2 fL (ref 80.0–100.0)
Platelets: 30 10*3/uL — ABNORMAL LOW (ref 150–400)
Platelets: 33 10*3/uL — ABNORMAL LOW (ref 150–400)
Platelets: 31 10*3/uL — ABNORMAL LOW (ref 150–400)
RBC: 2.69 MIL/uL — ABNORMAL LOW (ref 4.22–5.81)
RBC: 2.52 MIL/uL — ABNORMAL LOW (ref 4.22–5.81)
RBC: 2.34 MIL/uL — ABNORMAL LOW (ref 4.22–5.81)
RDW: 18.8 % — ABNORMAL HIGH (ref 11.5–15.5)
RDW: 19.3 % — ABNORMAL HIGH (ref 11.5–15.5)
RDW: 19.7 % — ABNORMAL HIGH (ref 11.5–15.5)
WBC: 16.8 10*3/uL — ABNORMAL HIGH (ref 4.0–10.5)
WBC: 14.2 10*3/uL — ABNORMAL HIGH (ref 4.0–10.5)
WBC: 13.1 10*3/uL — ABNORMAL HIGH (ref 4.0–10.5)
nRBC: 0.1 % (ref 0.0–0.2)
nRBC: 0.1 % (ref 0.0–0.2)
nRBC: 0.2 % (ref 0.0–0.2)

## 2019-12-16 LAB — TYPE AND SCREEN
ABO/RH(D): AB POS
Antibody Screen: NEGATIVE
Unit division: 0
Unit division: 0
Unit division: 0
Unit division: 0

## 2019-12-16 LAB — BPAM FFP
Blood Product Expiration Date: 202111252359
Blood Product Expiration Date: 202111252359
Blood Product Expiration Date: 202111252359
Blood Product Expiration Date: 202111252359
ISSUE DATE / TIME: 202111210319
ISSUE DATE / TIME: 202111210319
ISSUE DATE / TIME: 202111210319
ISSUE DATE / TIME: 202111210319
Unit Type and Rh: 8400
Unit Type and Rh: 8400
Unit Type and Rh: 8400
Unit Type and Rh: 8400

## 2019-12-16 LAB — BPAM RBC
Blood Product Expiration Date: 202112072359
Blood Product Expiration Date: 202112072359
Blood Product Expiration Date: 202112142359
Blood Product Expiration Date: 202112142359
ISSUE DATE / TIME: 202111201941
ISSUE DATE / TIME: 202111201941
ISSUE DATE / TIME: 202111210634
ISSUE DATE / TIME: 202111210634
Unit Type and Rh: 6200
Unit Type and Rh: 6200
Unit Type and Rh: 8400
Unit Type and Rh: 8400

## 2019-12-16 LAB — MAGNESIUM: Magnesium: 1.7 mg/dL (ref 1.7–2.4)

## 2019-12-16 LAB — BASIC METABOLIC PANEL
Anion gap: 10 (ref 5–15)
BUN: 32 mg/dL — ABNORMAL HIGH (ref 6–20)
CO2: 22 mmol/L (ref 22–32)
Calcium: 8.1 mg/dL — ABNORMAL LOW (ref 8.9–10.3)
Chloride: 104 mmol/L (ref 98–111)
Creatinine, Ser: 0.77 mg/dL (ref 0.61–1.24)
GFR, Estimated: >60 mL/min (ref >60)
Glucose, Bld: 127 mg/dL — ABNORMAL HIGH (ref 70–99)
Potassium: 3.7 mmol/L (ref 3.5–5.1)
Sodium: 136 mmol/L (ref 135–145)

## 2019-12-16 LAB — GLUCOSE, CAPILLARY
Glucose-Capillary: 123 mg/dL — ABNORMAL HIGH (ref 70–99)
Glucose-Capillary: 122 mg/dL — ABNORMAL HIGH (ref 70–99)
Glucose-Capillary: 128 mg/dL — ABNORMAL HIGH (ref 70–99)
Glucose-Capillary: 125 mg/dL — ABNORMAL HIGH (ref 70–99)
Glucose-Capillary: 114 mg/dL — ABNORMAL HIGH (ref 70–99)
Glucose-Capillary: 101 mg/dL — ABNORMAL HIGH (ref 70–99)
Glucose-Capillary: 112 mg/dL — ABNORMAL HIGH (ref 70–99)

## 2019-12-16 LAB — PHOSPHORUS: Phosphorus: 3.4 mg/dL (ref 2.5–4.6)

## 2019-12-16 LAB — PREPARE FRESH FROZEN PLASMA

## 2019-12-16 LAB — PROTIME-INR
INR: 2.6 — ABNORMAL HIGH (ref 0.8–1.2)
Prothrombin Time: 27 seconds — ABNORMAL HIGH (ref 11.4–15.2)

## 2019-12-16 IMAGING — CT CT HEAD W/O CM
3 of 5 series · 14 of 47 positions shown, 16 images · non-contrast
Comparison: [DATE].

CLINICAL DATA: Altered mental status after fall.

EXAM:
CT HEAD WITHOUT CONTRAST
TECHNIQUE: Contiguous axial images were obtained from the base of the skull
through the vertex without intravenous contrast.

[Series 5: cor soft · coronal · 0.36mm/px · 3 of 71 slices shown]
[im 24/71  brain]
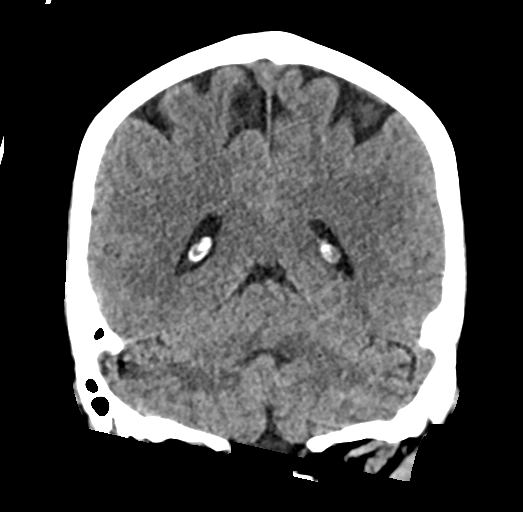
[im 32/71  brain]
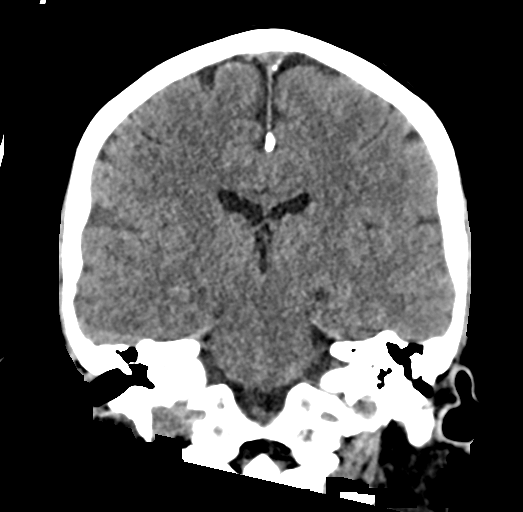
[im 39/71  brain]
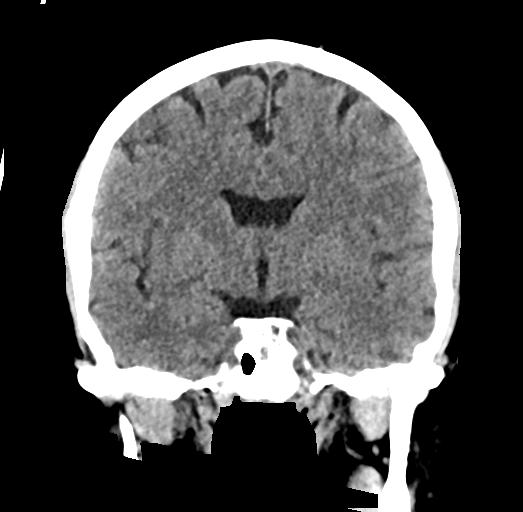

[Series 6: sag soft · sagittal · 0.36mm/px · 3 of 52 slices shown]
[im 21/52  brain]
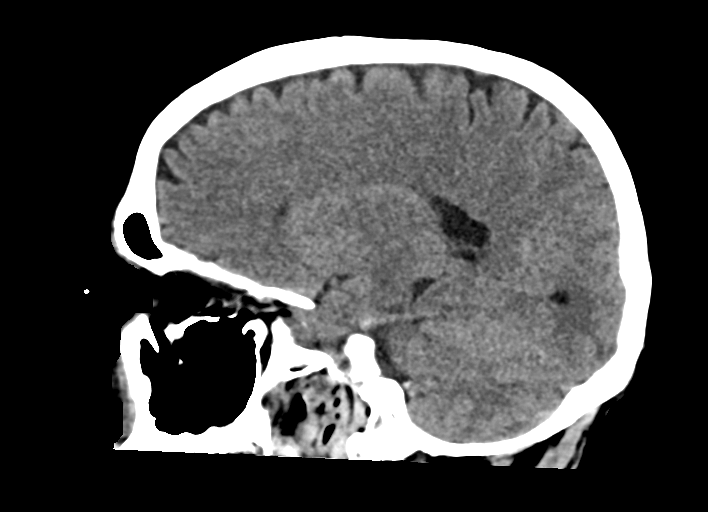
[im 26/52  brain]
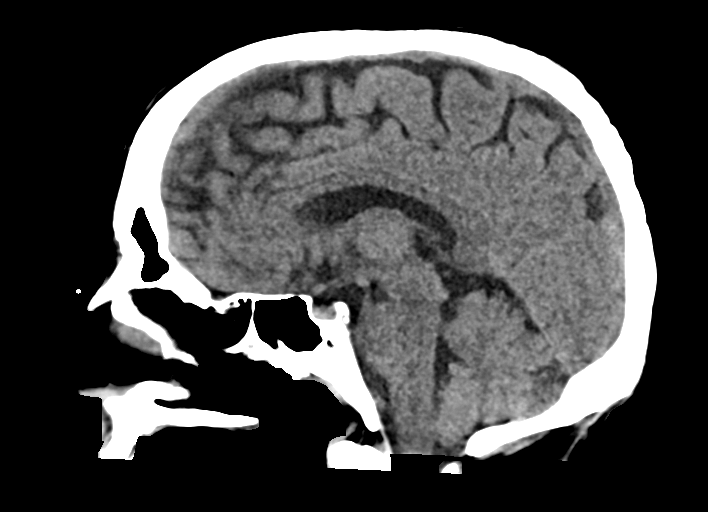
[im 31/52  brain]
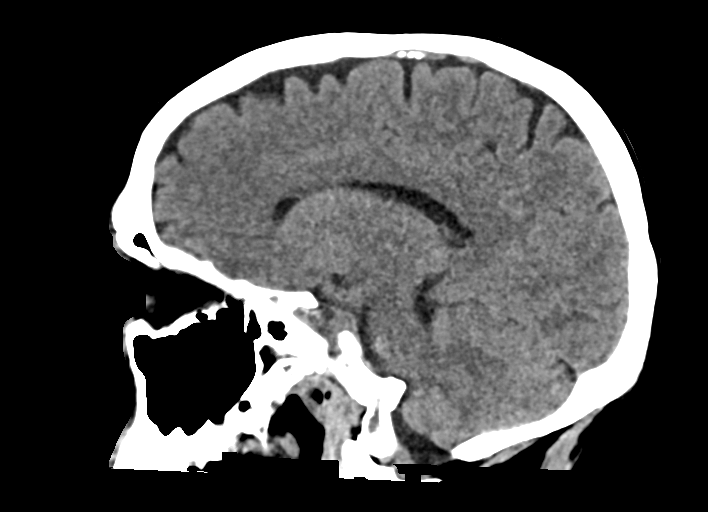

[Series 8: axial · axial · 0.44mm/px · z∈[+1162,+1292]mm · 8 of 83 slices shown, 10 images]
[im 8/83  brain]
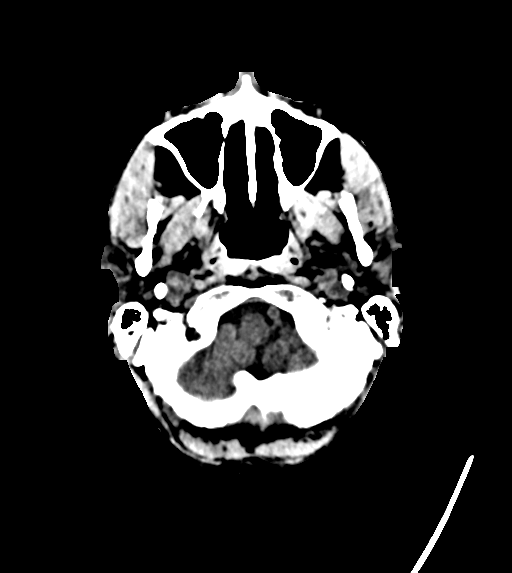
[im 8/83  bone]
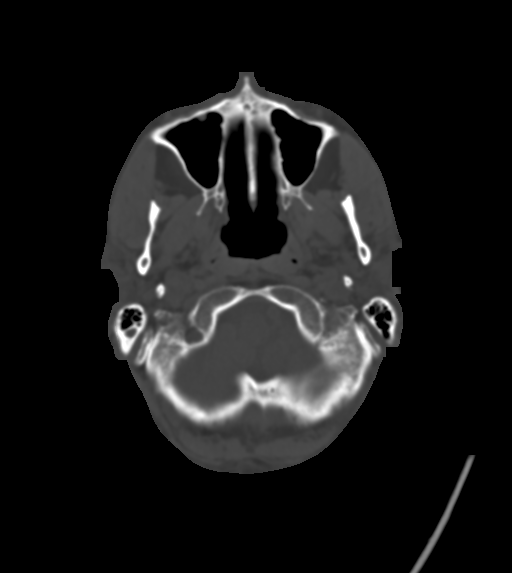
[im 15/83  brain]
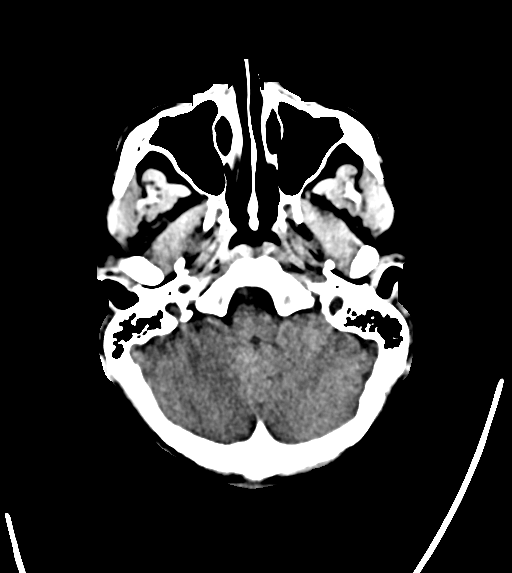
[im 30/83  brain]
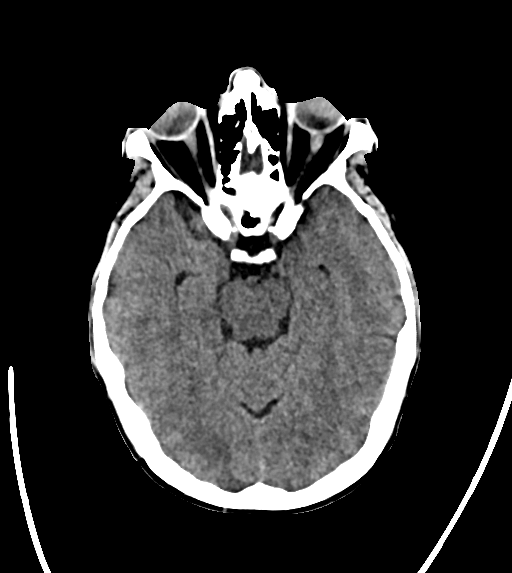
[im 38/83  brain]
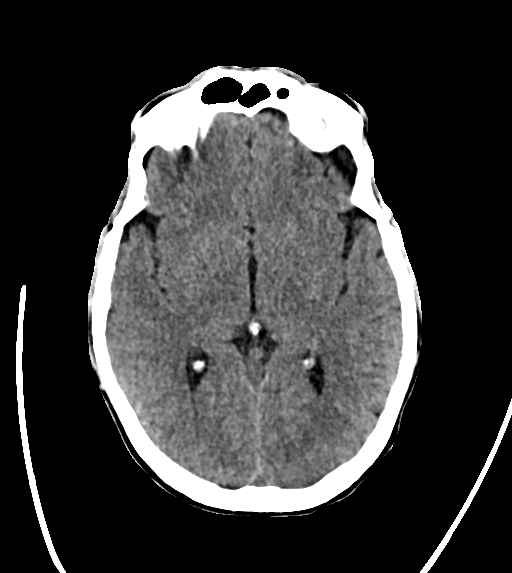
[im 45/83  brain]
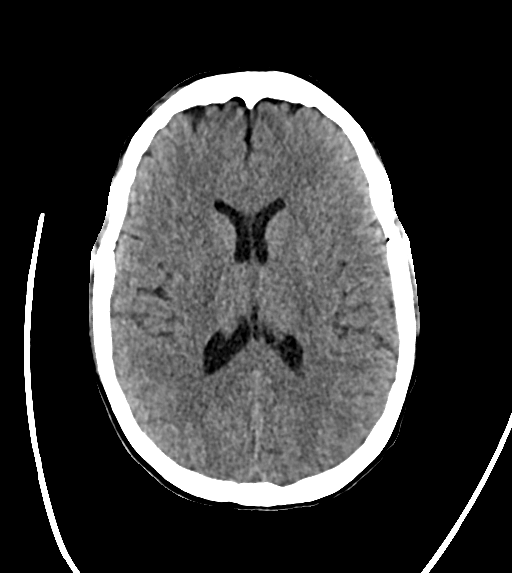
[im 45/83  bone]
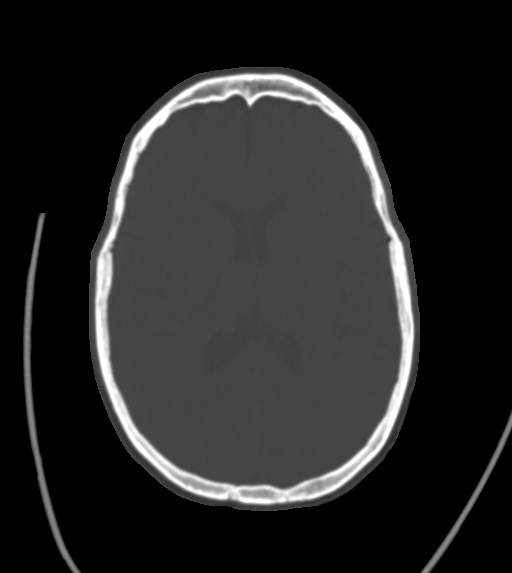
[im 53/83  brain]
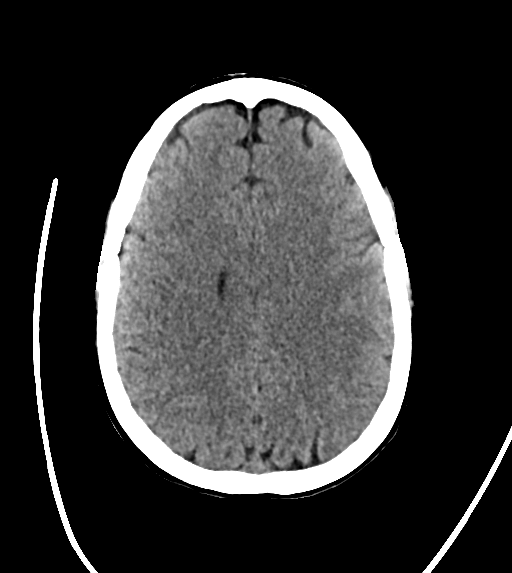
[im 68/83  brain]
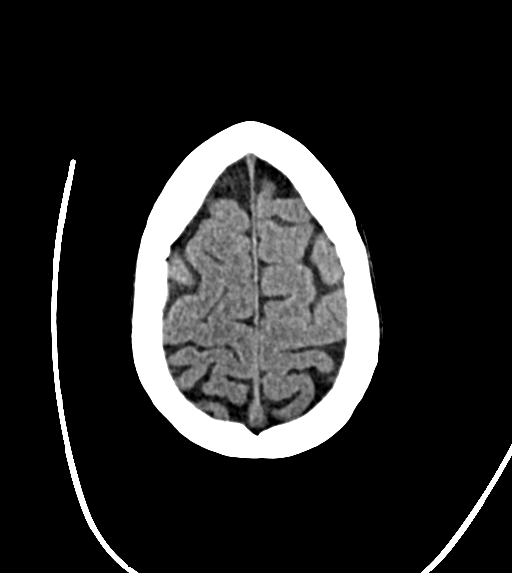
[im 75/83  brain]
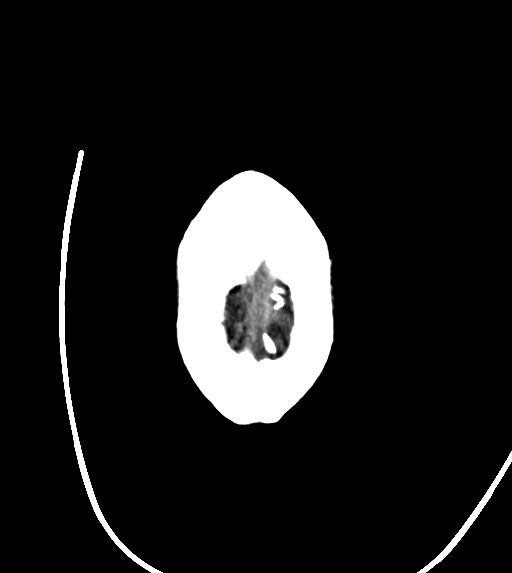

[14 of 47 positions shown; findings below may reference images not displayed]

FINDINGS: Brain: No evidence of acute infarction, hemorrhage, hydrocephalus,
extra-axial collection or mass lesion/mass effect.

Vascular: No hyperdense vessel or unexpected calcification.

Skull: Normal. Negative for fracture or focal lesion.

Sinuses/Orbits: No acute finding.

Other: None.
IMPRESSION: Normal head CT.

## 2019-12-16 MED ORDER — PANTOPRAZOLE SODIUM 40 MG IV SOLR
40.0000 mg | Freq: Two times a day (BID) | INTRAVENOUS | Status: DC
  Administered 2019-12-16 – 2019-12-19 (×7): 40 mg via INTRAVENOUS
  Filled 2019-12-16 (×7): qty 40

## 2019-12-16 MED ORDER — CHLORHEXIDINE GLUCONATE 0.12 % MT SOLN
15.0000 mL | Freq: Two times a day (BID) | OROMUCOSAL | Status: DC
  Administered 2019-12-17: 15 mL via OROMUCOSAL
  Filled 2019-12-16: qty 15

## 2019-12-16 MED ORDER — POTASSIUM CHLORIDE 10 MEQ/100ML IV SOLN
10.0000 meq | INTRAVENOUS | Status: AC
  Administered 2019-12-16 (×4): 10 meq via INTRAVENOUS
  Filled 2019-12-16 (×4): qty 100

## 2019-12-16 MED ORDER — ALBUMIN HUMAN 25 % IV SOLN
25.0000 g | Freq: Four times a day (QID) | INTRAVENOUS | Status: AC
  Administered 2019-12-16 – 2019-12-17 (×4): 25 g via INTRAVENOUS
  Filled 2019-12-16 (×4): qty 100

## 2019-12-16 MED ORDER — ORAL CARE MOUTH RINSE
15.0000 mL | Freq: Two times a day (BID) | OROMUCOSAL | Status: DC
  Administered 2019-12-17 – 2019-12-18 (×4): 15 mL via OROMUCOSAL

## 2019-12-16 MED ORDER — SODIUM CHLORIDE 0.9% IV SOLUTION: Freq: Once | INTRAVENOUS | Status: DC

## 2019-12-16 MED ORDER — LACTULOSE ENEMA
300.0000 mL | Freq: Once | ORAL | Status: DC
  Filled 2019-12-16: qty 300

## 2019-12-16 MED ORDER — LACTULOSE ENEMA
300.0000 mL | Freq: Two times a day (BID) | ORAL | Status: DC
  Administered 2019-12-16 (×2): 300 mL via RECTAL
  Filled 2019-12-16 (×3): qty 300

## 2019-12-16 MED ORDER — MAGNESIUM SULFATE 2 GM/50ML IV SOLN
2.0000 g | Freq: Once | INTRAVENOUS | Status: AC
  Administered 2019-12-16: 2 g via INTRAVENOUS
  Filled 2019-12-16: qty 50

## 2019-12-16 MED ORDER — LORAZEPAM 2 MG/ML IJ SOLN
2.0000 mg | Freq: Once | INTRAMUSCULAR | Status: AC
  Administered 2019-12-16: 2 mg via INTRAVENOUS

## 2019-12-16 NOTE — Consult Note (Signed)
Chief Complaint: Patient was seen in consultation today for consideration of TIPS Chief Complaint  Patient presents with  . Diarrhea  . Emesis   at the request of Dr Cleaster Corin   Supervising Physician: Corrie Mckusick  Patient Status: [facility] - In-pt  History of Present Illness: Joshua Hubbard is a 44 y.o. male   GI bleed Hx Cirrhosis and esophageal varices To ED 11/20 with melena/diarrhea; hematemesis x 24 hrs Syncopal episode Known Hx etoh abuse (none since Jan 2021): although elevated etoh on arrival (23) Hx esophageal varices ( banding Sept 2020)  Melena; hematemesis Anemia Lactic acidosis Ammonia 62 on admission (42) TB: 6.2 (6.9) INR 4.0 (2.9)  CT 11/20: IMPRESSION: 1. Examination is generally limited by breath motion artifact. 2. Stigmata of cirrhosis and portal hypertension, including splenomegaly and moderate volume ascites, increased compared to prior examination. 3. Distended gallbladder containing multiple small gallstones. No biliary ductal dilatation.  Referral to GI Dr Watt Climes: Endo 11/21:  Impression:               - Grade I esophageal varices.                           - Small hiatal hernia.                           - Gastric varices at the GE junction in hiatal                            hernia pouch, oozing blood. Banded.                           - Clotted blood in the gastric fundus.                           - Normal greater curvature of the stomach, lesser                            curvature of the stomach, incisura, antrum,                            prepyloric region of the stomach and pylorus.                           - Normal duodenal bulb, first portion of the                            duodenum, second portion of the duodenum and third                            portion of the duodenum.   Request for IR to consider TIPs procedure Dr Anselm Pancoast note from 11/21:  Interventional Radiology consulted for possible TIPS procedure. History,  imaging and labs have been briefly reviewed.  Patient has been evaluated at [facility].  Recently admitted for hemorrhagic shock from upper GI bleeding with history of decompensated alcoholic cirrhosis. Patient just underwent upper endoscopy with variceal banding by Dr. Watt Climes and there is concern that the patient may re-bleed.  Unfortunately, patient's labs are very abnormal  with T.bilirubin of 6.9 and INR of 2.9.  Currently the MELD is 28.  Patient is a poor TIPS candidate based on current MELD score.  Plan for formal consult tomorrow and will re-assess labs and patient condition.   MELD today: 45    Past Medical History:  Diagnosis Date  . Arthritis   . Blood transfusion without reported diagnosis    recevied 4 units PRBC and FFP during hospitalization from 1/17-1/22/21  . Cirrhosis ([place])   . Hypertension   . PONV (postoperative nausea and vomiting)   . Tears of meniscus and ACL of left knee 03/19/2018    Past Surgical History:  Procedure Laterality Date  . ARTHROSCOPIC REPAIR ACL     x2  . BIOPSY  10/05/2018   Procedure: BIOPSY;  Surgeon: Ronald Lobo, MD;  Location: [place];  Service: Endoscopy;;  . ESOPHAGEAL BANDING  02/12/2019   Procedure: ESOPHAGEAL BANDING;  Surgeon: [name], MD;  Location: [place];  Service: Gastroenterology;;  . ESOPHAGOGASTRODUODENOSCOPY (EGD) WITH PROPOFOL N/A 10/05/2018   Procedure: ESOPHAGOGASTRODUODENOSCOPY (EGD) WITH PROPOFOL;  Surgeon: Ronald Lobo, MD;  Location: [place];  Service: Endoscopy;  Laterality: N/A;  . ESOPHAGOGASTRODUODENOSCOPY (EGD) WITH PROPOFOL N/A 02/12/2019   Procedure: ESOPHAGOGASTRODUODENOSCOPY (EGD) WITH PROPOFOL;  Surgeon: [name], MD;  Location: [facility];  Service: Gastroenterology;  Laterality: N/A;  . HERNIA REPAIR     x3  . KNEE ARTHROSCOPY WITH ANTERIOR CRUCIATE LIGAMENT (ACL) REPAIR WITH HAMSTRING GRAFT Left 03/20/2018   Procedure: KNEE ARTHROSCOPY WITH REVESION ANTERIOR CRUCIATE LIGAMENT  (ACL) REPAIR WITH  HAMSTRING ALLOGRAFT,;  Surgeon: Elsie Saas, MD;  Location: [place];  Service: Orthopedics;  Laterality: Left;  . KNEE ARTHROSCOPY WITH LATERAL MENISECTOMY Left 03/20/2018   Procedure: KNEE ARTHROSCOPY WITH LATERAL MENISECTOMY;  Surgeon: Elsie Saas, MD;  Location: [place];  Service: Orthopedics;  Laterality: Left;  . KNEE ARTHROSCOPY WITH MEDIAL MENISECTOMY Left 03/20/2018   Procedure: KNEE ARTHROSCOPY WITH MEDIAL MENISECTOMY;  Surgeon: Elsie Saas, MD;  Location: [place];  Service: Orthopedics;  Laterality: Left;    Allergies: Patient has no known allergies.  Medications: Prior to Admission medications   Medication Sig Start Date End Date Taking? Authorizing Provider  Multiple Vitamin (MULTIVITAMIN WITH MINERALS) TABS tablet Take 1 tablet by mouth daily.   Yes [provider]  propranolol (INDERAL) 20 MG tablet Take 1 tablet (20 mg total) by mouth 2 (two) times daily. Patient taking differently: Take 20 mg by mouth 3 (three) times daily.  10/16/19  Yes [name], MD  spironolactone (ALDACTONE) 50 MG tablet Take one a day for a week then start one bid Patient taking differently: Take 50 mg by mouth 2 (two) times daily.  10/11/19  Yes [name], MD  acamprosate (CAMPRAL) 333 MG tablet Take 2 tablets (666 mg total) by mouth 3 (three) times daily. 10/07/19   [name], MD  buPROPion (WELLBUTRIN XL) 150 MG 24 hr tablet TAKE 1 TABLET BY MOUTH EVERY DAY 09/23/19   [name], MD  clonazePAM (KLONOPIN) 1 MG tablet TAKE 1 TABLET BY MOUTH AT BEDTIME 03/26/19   [name], MD  furosemide (LASIX) 20 MG tablet  11/15/19   [provider]  lactulose (CHRONULAC) 10 GM/15ML solution Take 20 g by mouth daily. 11/25/19   [provider]  oxyCODONE (OXYCONTIN) 20 mg 12 hr tablet Take one or two by mouth every 12 hours needed for pain 11/19/19   Dorcas Mcmurray  L, MD  pantoprazole (PROTONIX) 40 MG tablet  Take 1 tablet (40 mg total) by mouth daily. 02/15/19 03/17/19  Jonnie Finner, DO     Family History  Problem Relation Age of Onset  . Osteoarthritis Mother   . Coronary artery disease Father   . Hyperlipidemia Father     Social History   Socioeconomic History  . Marital status: Married    Spouse name: Not on file  . Number of children: Not on file  . Years of education: Not on file  . Highest education level: Not on file  Occupational History  . Occupation: Patent attorney with [employer]    Employer: [employer]  Tobacco Use  . Smoking status: Never Smoker  . Smokeless tobacco: Never Used  Vaping Use  . Vaping Use: Never used  Substance and Sexual Activity  . Alcohol use: Yes    Alcohol/week: 42.0 standard drinks    Types: 42 Cans of beer per week  . Drug use: No  . Sexual activity: Not on file  Other Topics Concern  . Not on file  Social History Narrative  . Not on file   Social Determinants of Health   Financial Resource Strain:   . Difficulty of Paying Living Expenses: Not on file  Food Insecurity:   . Worried About Charity fundraiser in the Last Year: Not on file  . Ran Out of Food in the Last Year: Not on file  Transportation Needs:   . Lack of Transportation (Medical): Not on file  . Lack of Transportation (Non-Medical): Not on file  Physical Activity:   . Days of Exercise per Week: Not on file  . Minutes of Exercise per Session: Not on file  Stress:   . Feeling of Stress : Not on file  Social Connections:   . Frequency of Communication with Friends and Family: Not on file  . Frequency of Social Gatherings with Friends and Family: Not on file  . Attends Religious Services: Not on file  . Active Member of Clubs or Organizations: Not on file  . Attends Archivist Meetings: Not on file  . Marital Status: Not on file    Review of Systems: A 12 point ROS discussed and pertinent positives are indicated in the HPI above.  All  other systems are negative.   Vital Signs: BP 106/73 (BP Location: Right Arm)   Pulse (!) 122   Temp 99.3 F (37.4 C) (Axillary)   Resp (!) 23   Ht 5\' 9"  (1.753 m)   Wt 165 lb (74.8 kg)   SpO2 97%   BMI 24.37 kg/m   Physical Exam Vitals reviewed.  Cardiovascular:     Rate and Rhythm: Tachycardia present.     Heart sounds: No murmur heard.   Pulmonary:     Breath sounds: Normal breath sounds.  Abdominal:     Palpations: Abdomen is soft.  Skin:    General: Skin is warm.  Neurological:     Comments: Does not follow commands No response     Imaging: DG Chest 2 View  Result Date: 12/14/2019 CLINICAL DATA:  Abdominal pain with vomiting EXAM: CHEST - 2 VIEW COMPARISON:  02/10/2019 FINDINGS: The heart size and mediastinal contours are within normal limits. Both lungs are clear. The visualized skeletal structures are unremarkable. IMPRESSION: No active cardiopulmonary disease. Electronically Signed   By: Jerilynn Mages.  Shick M.D.   On: 12/14/2019 15:05   CT Abdomen Pelvis W Contrast  Result  Date: 12/14/2019 CLINICAL DATA:  Abdominal distension EXAM: CT ABDOMEN AND PELVIS WITH CONTRAST TECHNIQUE: Multidetector CT imaging of the abdomen and pelvis was performed using the standard protocol following bolus administration of intravenous contrast. CONTRAST:  115mL OMNIPAQUE IOHEXOL 300 MG/ML  SOLN COMPARISON:  10/04/2019 FINDINGS: Examination is generally limited by breath motion artifact. Lower chest: No acute abnormality. Large lower esophageal varices (series 2, image 17). Hepatobiliary: Cirrhotic morphology of the liver. Distended gallbladder containing multiple small gallstones. No biliary ductal dilatation. Pancreas: Unremarkable. No pancreatic ductal dilatation or surrounding inflammatory changes. Spleen: Splenomegaly, maximum coronal span 15.9 cm. Adrenals/Urinary Tract: Adrenal glands are unremarkable. Kidneys are normal, without renal calculi, solid lesion, or hydronephrosis. Bladder is  unremarkable. Stomach/Bowel: Stomach is within normal limits. Appendix appears normal. No evidence of bowel wall thickening, distention, or inflammatory changes. Vascular/Lymphatic: Aortic atherosclerosis. Varices about the ventral abdomen. No enlarged abdominal or pelvic lymph nodes. Reproductive: No mass or other significant abnormality. Other: No abdominal wall hernia or abnormality. Moderate volume ascites throughout the abdomen and pelvis, increased compared to prior examination. Musculoskeletal: No acute or significant osseous findings. IMPRESSION: 1. Examination is generally limited by breath motion artifact. 2. Stigmata of cirrhosis and portal hypertension, including splenomegaly and moderate volume ascites, increased compared to prior examination. 3. Distended gallbladder containing multiple small gallstones. No biliary ductal dilatation. Aortic Atherosclerosis (ICD10-I70.0). Electronically Signed   By: Eddie Candle M.D.   On: 12/14/2019 15:11    Labs:  CBC: Recent Labs    12/15/19 0900 12/15/19 0900 12/15/19 1346 12/15/19 2139 12/16/19 0132 12/16/19 0742  WBC 20.5*  --   --  17.7* 16.8* 14.2*  HGB 7.6*   < > 7.9* 8.1* 7.8* 7.2*  HCT 22.4*   < > 22.9* 23.3* 22.8* 21.1*  PLT 49*  --   --  30* 30* 33*   < > = values in this interval not displayed.    COAGS: Recent Labs    02/10/19 2120 02/11/19 1235 10/16/19 1103 12/14/19 1654 12/15/19 0019 12/15/19 0900  INR >10.0*   < > 1.6* 2.8* 4.0* 2.9*  APTT >200*  --   --   --   --   --    < > = values in this interval not displayed.    BMP: Recent Labs    10/04/19 0815 10/04/19 0815 10/16/19 1103 10/16/19 1103 11/01/19 1534 11/01/19 1534 11/11/19 1530 12/14/19 0928 12/15/19 0310 12/16/19 0132  NA 131*  --  134   < > 124*   < > 128* 128* 133* 136  K 3.7   < > 4.5   < > 3.6   < > 4.1 4.3 4.5 3.7  CL 97*   < > 102   < > 88*   < > 89* 94* 102 104  CO2 23   < > 24   < > 25   < > 26 23 16* 22  GLUCOSE 103*  --  101*   < >  93   < > 108* 164* 166* 127*  BUN <5*  --  4*   < > 9   < > 8 31* 37* 32*  CALCIUM 7.9*   < > 8.7   < > 9.0   < > 9.4 8.1* 8.1* 8.1*  CREATININE 0.52*   < > 0.64*   < > 0.67*   < > 0.83 0.67 1.03 0.77  GFRNONAA >60   < > 118   < > 116   < > 106 >  60 >60 >60  GFRAA >60  --  137  --  134  --  123  --   --   --    < > = values in this interval not displayed.    LIVER FUNCTION TESTS: Recent Labs    10/04/19 0815 10/16/19 1103 12/14/19 0928 12/15/19 0310  BILITOT 5.2* 4.0* 6.2* 6.9*  AST 140* 119* 90* 95*  ALT 31 31 45* 47*  ALKPHOS 123 124* 72 51  PROT 7.2 7.2 6.8 4.8*  ALBUMIN 2.6* 2.9* 2.3* 1.7*    TUMOR MARKERS: No results for input(s): AFPTM, CEA, CA199, CHROMGRNA in the last 8760 hours.  Assessment and Plan:  Etoh Cirrhosis Known esophageal varices Previous banding 2020 New hematemesis and melena EGD and additional banding with Dr Watt Climes yesterday Request was made for consideration of TIPs procedure MELD score 28 yesterday; 26 today Not a good candidate for TIPS per lab findings and MELD- even still today Plan per GI and PCCM If status changes- labs improve-- Please re consult IR if we can help.   Thank you for this interesting consult.  I greatly enjoyed meeting Joshua Hubbard and look forward to participating in their care.  A copy of this report was sent to the requesting provider on this date.  Electronically Signed: Lavonia Drafts, PA-C 12/16/2019, 9:26 AM   I spent a total of 20 Minutes    in face to face in clinical consultation, greater than 50% of which was counseling/coordinating care for consideration of TIPs

## 2019-12-16 NOTE — Progress Notes (Addendum)
[facility] Progress Note  Joshua Hubbard 44 y.o. [birth year]  CC:  Decompensated cirrhosis, variceal bleeding   Subjective: Patient disoriented.  Opens eyes to verbal commands but unable to follow directions.  Per RN, stools are starting to look lighter.  No hematemesis.  RN also reports patient had a fall at home prior to arrival per patient's mother.  CT head pending, though I suspect mental status changes are due to hepatic encephalopathy.  ROS : Review of Systems  Unable to perform ROS: Mental status change   Objective: Vital signs in last 24 hours: Vitals:   12/16/19 0720 12/16/19 0800  BP:  106/73  Pulse:  (!) 122  Resp:  (!) 23  Temp: 99.3 F (37.4 C)   SpO2:  97%    Physical Exam:  General:  Jaundice, ill-appearing, lethargic, disoriented  Head:  Normocephalic, without obvious abnormality, atraumatic  Eyes:  Scleral icterus, EOMs intact  Lungs:   Clear to auscultation bilaterally, mild tachypnea  Heart:  Tachycardic with regular rhythm, S1, S2 normal  Abdomen:   Soft, grimace and guarding with palpation of epigastrium, bowel sounds active all four quadrants,  no peritoneal signs  Extremities: Extremities normal, atraumatic, no  edema  Pulses: 2+ and symmetric    Lab Results: Recent Labs    12/15/19 0310 12/16/19 0132  NA 133* 136  K 4.5 3.7  CL 102 104  CO2 16* 22  GLUCOSE 166* 127*  BUN 37* 32*  CREATININE 1.03 0.77  CALCIUM 8.1* 8.1*  MG 1.2* 1.7  PHOS 5.3* 3.4   Recent Labs    12/14/19 0928 12/15/19 0310  AST 90* 95*  ALT 45* 47*  ALKPHOS 72 51  BILITOT 6.2* 6.9*  PROT 6.8 4.8*  ALBUMIN 2.3* 1.7*   Recent Labs    12/15/19 2139 12/16/19 0132  WBC 17.7* 16.8*  HGB 8.1* 7.8*  HCT 23.3* 22.8*  MCV 83.2 84.8  PLT 30* 30*   Recent Labs    12/15/19 0019 12/15/19 0900  LABPROT 37.7* 29.3*  INR 4.0* 2.9*    Assessment: Variceal Bleeding:  -EGD 11/21: Grade I esophageal varices, gastric varices at the GE junction in  hiatal hernia pouch, oozing blood, banded. Clotted blood in the gastric fundus. -IR consultation pending, consider TIPS if re-bleeding -Hgb 7.8, stable as compared to yesterday -BUN 32, elevated out of proportion to creatinine but decreased from 37 yesterday  Decompensated cirrhosis: MELD score of 28 as of 12/15/19. Coagulopathy and enceopalopathy -thrombocytopenia: platelets 30K/uL today -INR 2.9 yesterday, today's INR pending -T. Bili 6.9/AST 95/ALT 47/ALP 51 as of 11/21  Hepatic encephalopathy  Plan: Start lactulose enemas BID.  Once patient can tolerate PO, can transition to PO dosing and add Rifaximin.    Await IR consultation. Consideration of TIPS if rebleeding.  Continue octreotide drip and Protonix IV BID.  Continue to monitor H&H with transfusion as needed to maintain Hgb >7.  [facility] will follow.  Salley Slaughter PA-C 12/16/2019, 9:00 AM  Contact #  [phone]

## 2019-12-16 NOTE — Progress Notes (Signed)
Patient under ICU service care. Very ill with hemoglobin continuing t drift down, low albumin, elevated bilirubin and INR. Low platelets. Will speak with parents as per [name]'s request to keep them informed should he have to be hospitalized again (prior discussions). He is not responding to voice command for me at this time. Appreciate ICU care and GI, IR consults and care.

## 2019-12-16 NOTE — Progress Notes (Signed)
NAME:  Joshua Hubbard, MRN:  [id number], DOB:  [id number], LOS: 2 ADMISSION DATE:  12/14/2019, CONSULTATION DATE:  12/14/2019 REFERRING MD:  Zettie Cooley, FPTS, CHIEF COMPLAINT:  Hematemesis   Brief History   44 yo male with hx of ETOH presented to [facility] with nausea, vomiting and diarrhea for 1 day prior to admission.  Has hx of cirrhosis with esophageal varices.  Found to have elevated ETOH level (23), ammonia (62), and lactic acid (5.7).  Developed hematemesis and progressive anemia causing hypotension.  He was transferred to ICU and PCCM assumed care.  History of present illness   Pt confused and not able to provide additional history.  Past Medical History  HTN, ETOH with cirrhosis, esophageal varices, Chronic opiate use, Chronic benzo use, Depression  Significant Hospital Events   11/20 Admit to FPTS, transfer to ICU  Consults:  [facility]  Procedures:  11/21 EGD with variceal clipping  Significant Diagnostic Tests:  CT abd/pelvis 11/20 >> large esophageal varices, changes of cirrhosis, multiple gallstones, distended GB, splenomegaly 15.9 cm, mod ascites  Micro Data:  COVID/flu 11/20 >> negative MRSA PCR 11/20 >> negative Blood 11/20 >>  Antimicrobials:  Ceftriaxone x 5 days  Interim history/subjective:  Somnolent this AM. HD stable.  Objective   Blood pressure 125/61, pulse (!) 101, temperature 99.3 F (37.4 C), temperature source Axillary, resp. rate (!) 23, height 5\' 9"  (1.753 m), weight 74.8 kg, SpO2 99 %.        Intake/Output Summary (Last 24 hours) at 12/16/2019 0845 Last data filed at 12/16/2019 0700 Gross per 24 hour  Intake 1530.52 ml  Output 400 ml  Net 1130.52 ml   Filed Weights   12/14/19 0914  Weight: 74.8 kg    Examination:  Constitutional: jaundiced ill appearing man  Eyes: scleral icterus, tracking, pupils equal Ears, nose, mouth, and throat: MM dry, trachea midline, dried blood around lips Cardiovascular: Tachycardic, ext  warm Respiratory: Mildly tachypneic, clear Gastrointestinal: Soft, +BS Skin: No rashes, normal turgor Neurologic: he moves all 4 ext purposefully but not following commands Psychiatric: RASS -1  HgB 7.8 <<8.1 Plts 30 INR pending  Resolved Hospital Problem list     Assessment & Plan:   Hemorrhagic shock in setting of Upper GI bleeding with hx of decompensated alcoholic cirrhosis, ascites, and esophageal varices.  Improved Acute blood loss anemia from GI bleeding. Coagulopathy in setting of cirrhosis. - PPI, octreotide - Trend H/H, risks of transfusion need to be weighed against increasing portal pressures and rebleeding - Ceftriaxone x 5 days for SBP ppx - TIPS eval but may not be candidate at present time - Not liver transplant candidate with recent EtOH use (23 on admission)  Acute metabolic encephalopathy with concern for developing delirium tremens. Suspect hepatic encephalopathy Chronic opiate/benzo use. - CIWA q4h with prn ativan for CIWA > 8 - rectal lactulose given lack of PO options, NGT not great idea with recent banding - continue to hold outpt wellbutrin, klonopin, chronulac, oxycontin - ? Report of hitting head per mother, will check CT head  Lactic acidosis in setting of hemorrhagic shock and liver disease.  Will be slow to clear.  Unclear benefit to trending.  AKI related to shock  Best practice:  Diet: NPO DVT prophylaxis: SCDs GI prophylaxis: Protonix Mobility: bed rest Code Status: full Disposition: ICU   Patient critically ill due to metabolic encephalopathy, hemorrhagic shock Interventions to address this today rectal lactulose, head CT, IR consult Risk of deterioration without these  interventions is high  I personally spent 35 minutes providing critical care not including any separately billable procedures  Erskine Emery MD [facility] 12/16/2019 8:56 AM Personal pager: [phone] If unanswered, please page CCM On-call:  [phone]

## 2019-12-16 NOTE — Progress Notes (Signed)
CRITICAL VALUE ALERT  Critical Value:  Hbg 6.6  Date & Time Notied:  12/16/2019  Provider Notified: Dr. Tamala Julian  Orders Received/Actions taken: Awaiting orders

## 2019-12-17 DIAGNOSIS — K7031 Alcoholic cirrhosis of liver with ascites: Secondary | ICD-10-CM | POA: Diagnosis not present

## 2019-12-17 LAB — COMPREHENSIVE METABOLIC PANEL
ALT: 30 U/L (ref 0–44)
AST: 56 U/L — ABNORMAL HIGH (ref 15–41)
Albumin: 2.5 g/dL — ABNORMAL LOW (ref 3.5–5.0)
Alkaline Phosphatase: 45 U/L (ref 38–126)
Anion gap: 12 (ref 5–15)
BUN: 22 mg/dL — ABNORMAL HIGH (ref 6–20)
CO2: 23 mmol/L (ref 22–32)
Calcium: 8 mg/dL — ABNORMAL LOW (ref 8.9–10.3)
Chloride: 103 mmol/L (ref 98–111)
Creatinine, Ser: 0.74 mg/dL (ref 0.61–1.24)
GFR, Estimated: >60 mL/min (ref >60)
Glucose, Bld: 116 mg/dL — ABNORMAL HIGH (ref 70–99)
Potassium: 3.2 mmol/L — ABNORMAL LOW (ref 3.5–5.1)
Sodium: 138 mmol/L (ref 135–145)
Total Bilirubin: 9.9 mg/dL — ABNORMAL HIGH (ref 0.3–1.2)
Total Protein: 5.3 g/dL — ABNORMAL LOW (ref 6.5–8.1)

## 2019-12-17 LAB — CBC
HCT: 21.1 % — ABNORMAL LOW (ref 39.0–52.0)
HCT: 18.6 % — ABNORMAL LOW (ref 39.0–52.0)
HCT: 22.3 % — ABNORMAL LOW (ref 39.0–52.0)
Hemoglobin: 7.2 g/dL — ABNORMAL LOW (ref 13.0–17.0)
Hemoglobin: 6.5 g/dL — CL (ref 13.0–17.0)
Hemoglobin: 7.5 g/dL — ABNORMAL LOW (ref 13.0–17.0)
MCH: 29.3 pg (ref 26.0–34.0)
MCH: 30.7 pg (ref 26.0–34.0)
MCH: 29.5 pg (ref 26.0–34.0)
MCHC: 34.1 g/dL (ref 30.0–36.0)
MCHC: 34.9 g/dL (ref 30.0–36.0)
MCHC: 33.6 g/dL (ref 30.0–36.0)
MCV: 85.8 fL (ref 80.0–100.0)
MCV: 87.7 fL (ref 80.0–100.0)
MCV: 87.8 fL (ref 80.0–100.0)
Platelets: 30 10*3/uL — ABNORMAL LOW (ref 150–400)
Platelets: 29 10*3/uL — CL (ref 150–400)
Platelets: 34 10*3/uL — ABNORMAL LOW (ref 150–400)
RBC: 2.46 MIL/uL — ABNORMAL LOW (ref 4.22–5.81)
RBC: 2.12 MIL/uL — ABNORMAL LOW (ref 4.22–5.81)
RBC: 2.54 MIL/uL — ABNORMAL LOW (ref 4.22–5.81)
RDW: 18.1 % — ABNORMAL HIGH (ref 11.5–15.5)
RDW: 18.4 % — ABNORMAL HIGH (ref 11.5–15.5)
RDW: 18.9 % — ABNORMAL HIGH (ref 11.5–15.5)
WBC: 9.7 10*3/uL (ref 4.0–10.5)
WBC: 8 10*3/uL (ref 4.0–10.5)
WBC: 10 10*3/uL (ref 4.0–10.5)
nRBC: 0 % (ref 0.0–0.2)
nRBC: 0.4 % — ABNORMAL HIGH (ref 0.0–0.2)
nRBC: 0 % (ref 0.0–0.2)

## 2019-12-17 LAB — GLUCOSE, CAPILLARY
Glucose-Capillary: 132 mg/dL — ABNORMAL HIGH (ref 70–99)
Glucose-Capillary: 142 mg/dL — ABNORMAL HIGH (ref 70–99)
Glucose-Capillary: 63 mg/dL — ABNORMAL LOW (ref 70–99)
Glucose-Capillary: 93 mg/dL (ref 70–99)
Glucose-Capillary: 97 mg/dL (ref 70–99)
Glucose-Capillary: 99 mg/dL (ref 70–99)
Glucose-Capillary: 99 mg/dL (ref 70–99)

## 2019-12-17 LAB — GLOBAL TEG PANEL
CFF Max Amplitude: <4 mm — ABNORMAL LOW (ref 15–32)
CK with Heparinase (R): 5.5 min (ref 4.3–8.3)
Citrated Kaolin (K): 3.3 min — ABNORMAL HIGH (ref 0.8–2.1)
Citrated Kaolin (MA): <40 mm — ABNORMAL LOW (ref 52–69)
Citrated Kaolin (R): 5.7 min (ref 4.6–9.1)
Citrated Kaolin Angle: 65.2 deg (ref 63–78)
Citrated Rapid TEG (MA): <40 mm — ABNORMAL LOW (ref 52–70)

## 2019-12-17 LAB — PREPARE RBC (CROSSMATCH)

## 2019-12-17 LAB — PROTIME-INR
INR: 2.7 — ABNORMAL HIGH (ref 0.8–1.2)
Prothrombin Time: 27.9 seconds — ABNORMAL HIGH (ref 11.4–15.2)

## 2019-12-17 MED ORDER — WHITE PETROLATUM EX OINT
TOPICAL_OINTMENT | CUTANEOUS | Status: AC
  Administered 2019-12-17: 0.2 via TOPICAL
  Filled 2019-12-17: qty 28.35

## 2019-12-17 MED ORDER — FOLIC ACID 1 MG PO TABS
1.0000 mg | ORAL_TABLET | Freq: Every day | ORAL | Status: DC
  Administered 2019-12-17 – 2019-12-21 (×5): 1 mg via ORAL
  Filled 2019-12-17 (×5): qty 1

## 2019-12-17 MED ORDER — WHITE PETROLATUM EX OINT: TOPICAL_OINTMENT | CUTANEOUS | Status: DC | PRN

## 2019-12-17 MED ORDER — SODIUM CHLORIDE 0.9% IV SOLUTION: Freq: Once | INTRAVENOUS | Status: AC

## 2019-12-17 MED ORDER — ADULT MULTIVITAMIN W/MINERALS CH
1.0000 | ORAL_TABLET | Freq: Every day | ORAL | Status: DC
  Administered 2019-12-17 – 2019-12-21 (×5): 1 via ORAL
  Filled 2019-12-17 (×5): qty 1

## 2019-12-17 MED ORDER — THIAMINE HCL 100 MG PO TABS
100.0000 mg | ORAL_TABLET | Freq: Every day | ORAL | Status: DC
  Administered 2019-12-18 – 2019-12-21 (×4): 100 mg via ORAL
  Filled 2019-12-17 (×4): qty 1

## 2019-12-17 MED ORDER — POTASSIUM CHLORIDE 10 MEQ/100ML IV SOLN
10.0000 meq | INTRAVENOUS | Status: AC
  Administered 2019-12-17 (×4): 10 meq via INTRAVENOUS
  Filled 2019-12-17: qty 100

## 2019-12-17 MED ORDER — LACTULOSE 10 GM/15ML PO SOLN
20.0000 g | Freq: Three times a day (TID) | ORAL | Status: DC
  Administered 2019-12-17 – 2019-12-21 (×13): 20 g via ORAL
  Filled 2019-12-17 (×14): qty 30

## 2019-12-17 MED ORDER — RIFAXIMIN 550 MG PO TABS
550.0000 mg | ORAL_TABLET | Freq: Three times a day (TID) | ORAL | Status: DC
  Administered 2019-12-17: 550 mg via ORAL
  Filled 2019-12-17: qty 1

## 2019-12-17 NOTE — Progress Notes (Addendum)
12/17/2019 I saw and evaluated the patient. Discussed with resident and agree with resident's findings and plan as documented in the resident's note.  I have seen and evaluated the patient for GI bleed and encephalopathy.  S:  Looks much better today, had unit of blood yesterday.  He is hungry.  O: Blood pressure 128/60, pulse 84, temperature 98 F (36.7 C), temperature source Oral, resp. rate 19, height 5\' 9"  (1.753 m), weight 75 kg, SpO2 98 %.  Jaundiced man in NAD MM dry +scleral icterus LE with trace edema Spider angiomata noted Moves all 4 ext to command, minimal asterixis Oriented  INR 2.6>>2.7 HgB 6.6>>7.2  A:  - Hemorrhagic shock secondary to ABLA from esophageal varices s/p banding - Decompensated alcoholic cirrhosis with recent EtOH use - Hepatic encephalopathy improved - Acute kidney injury from hemorrhagic shock improved  P:  - Continue H/H checks - Advance to clear liquid diet, switch lactulose PR to PO - Ceftriaxone x 5 days - Not TIPS candidate for foreseeable future given high MELD (26 today) - Octreotide with duration per GI - Watch one more day in ICU to assure stable H/H  12/17/2019 Erskine Emery MD    NAME:  Joshua Hubbard, MRN:  [id number], DOB:  25-Jul-1975, LOS: 3 ADMISSION DATE:  12/14/2019, CONSULTATION DATE:  12/14/2019 REFERRING MD:  Zettie Cooley, FPTS, CHIEF COMPLAINT:  Hematemesis   Brief History   44 yo male with hx of ETOH presented to [facility] with nausea, vomiting and diarrhea for 1 day prior to admission.  Has hx of cirrhosis with esophageal varices.  Found to have elevated ETOH level (23), ammonia (62), and lactic acid (5.7).  Developed hematemesis and progressive anemia causing hypotension.  He was transferred to ICU and PCCM assumed care.  History of present illness   Pt confused and not able to provide additional history.   Past Medical History  HTN, ETOH with cirrhosis, esophageal varices, Chronic opiate use, Chronic benzo  use, Depression  Significant Hospital Events   11/20 Admit to FPTS, transfer to ICU  Consults:  [facility]  Procedures:  11/21 EGD with variceal clipping  Significant Diagnostic Tests:  CT abd/pelvis 11/20 >> large esophageal varices, changes of cirrhosis, multiple gallstones, distended GB, splenomegaly 15.9 cm, mod ascites  Micro Data:  COVID/flu 11/20 >> negative MRSA PCR 11/20 >> negative Blood 11/20 >>  Antimicrobials:  Ceftriaxone x 5 days  Interim history/subjective:  No acute overnight events. Patient awake but sleepy this morning. States he is feeling a bit more awake this morning does not remember coming to the hospital. Denies alcohol use since January 2021 and would like to see mother this morning. Denies any pain at this time. Expresses desire to eat today.  Objective   Blood pressure 128/60, pulse 84, temperature 97.8 F (36.6 C), temperature source Oral, resp. rate 19, height 5\' 9"  (1.753 m), weight 75 kg, SpO2 98 %.        Intake/Output Summary (Last 24 hours) at 12/17/2019 0750 Last data filed at 12/17/2019 4315 Gross per 24 hour  Intake 2033.2 ml  Output 875 ml  Net 1158.2 ml   Filed Weights   12/14/19 0914 12/17/19 0500  Weight: 74.8 kg 75 kg   Examination:  Constitutional: jaundiced ill appearing man  Eyes: scleral icterus, tracking, pupils equal Ears, nose, mouth, and throat: MM dry, dried blood around lips Cardiovascular: S1, S2 regular rate and rhythm, no edema Respiratory: CTA, no accessory muscle use Gastrointestinal: Soft, contender, no  distention, +BS Skin: No rashes, normal turgor Neurologic: Alert and oriented to person and place, mild asterixis, appropriately following commands Psychiatric: cooperative, delayed speech  Resolved Hospital Problem list     Assessment & Plan:   Hemorrhagic shock in setting of Upper GI bleeding with hx of decompensated alcoholic cirrhosis, ascites, and esophageal varices.  Improved Acute blood loss  anemia from GI bleeding. Coagulopathy in setting of cirrhosis. Does not appear to be actively bleeding this morning. IR evaluated for TIPS but is not a candidate given high MELD of 26. Not liver transplant candidate with recent EtOH use (23 on admission) - Continue PPI, octreotide - Continue to monitor hgb - Ceftriaxone x 5 days for SBP ppx  Acute metabolic encephalopathy with concern for developing delirium tremens. Suspect hepatic encephalopathy Chronic opiate/benzo use. - Improving mentation today - Advance diet - CIWA q4h with prn ativan for CIWA > 8 - Switch to oral lactulose start rifaximin  - continue to hold outpt wellbutrin, klonopin, oxycontin  AKI related to shock. Cr. 0.74 improved.  Hypokalemia replace.   Best practice:  Diet: Clear liquid diet DVT prophylaxis: SCDs GI prophylaxis: Protonix Mobility: bed rest Code Status: full Disposition: ICU  Iona Beard PGY-1 Internal medicine 12/17/19 8:18 AM

## 2019-12-17 NOTE — Progress Notes (Deleted)
12/17/2019 I saw and evaluated the patient. Discussed with resident and agree with resident's findings and plan as documented in the resident's note.  I have seen and evaluated the patient for GI bleed and encephalopathy.  S:  Looks much better today, had unit of blood yesterday.  He is hungry.  O: Blood pressure 128/60, pulse 84, temperature 98 F (36.7 C), temperature source Oral, resp. rate 19, height 5\' 9"  (1.753 m), weight 75 kg, SpO2 98 %.  Jaundiced man in NAD MM dry +scleral icterus LE with trace edema Spider angiomata noted Moves all 4 ext to command, minimal asterixis Oriented  INR 2.6>>2.7 HgB 6.6>>7.2  A:  - Hemorrhagic shock secondary to ABLA from esophageal varices s/p banding - Decompensated alcoholic cirrhosis with recent EtOH use - Hepatic encephalopathy improved - Acute kidney injury from hemorrhagic shock improved  P:  - Continue H/H checks - Advance to clear liquid diet, switch lactulose PR to PO - Ceftriaxone x 5 days - Not TIPS candidate for foreseeable future given high MELD (26 today) - Octreotide with duration per GI - Watch one more day in ICU to assure stable H/H  12/17/2019 Erskine Emery MD

## 2019-12-17 NOTE — Progress Notes (Signed)
Hypoglycemic Event  CBG: 63  Treatment: 8 oz juice/soda  Symptoms: None  Follow-up CBG: Time:2341 CBG Result:99  Possible Reasons for Event: Inadequate meal intake  Comments/MD notified: Followed ICU hypoglycemia standing orders   Oswaldo Conroy

## 2019-12-17 NOTE — Progress Notes (Signed)
[facility] Progress Note  Joshua Hubbard 44 y.o. Jul 31, 1975  CC:  Decompensated cirrhosis, variceal bleeding   Subjective: Patient is awake and oriented x 3 today.  He denies any abdominal pain, nausea, or vomiting.  He had a brown bowel movement this morning.  He is tolerating clear liquids.  ROS : Review of Systems  Unable to perform ROS: Mental status change  Cardiovascular: Negative for chest pain and palpitations.  Gastrointestinal: Negative for abdominal pain, blood in stool, constipation, diarrhea, heartburn, melena, nausea and vomiting.   Objective: Vital signs in last 24 hours: Vitals:   12/17/19 0800 12/17/19 0956  BP: 119/61 121/70  Pulse: 87 100  Resp: 19 (!) 22  Temp:  98.4 F (36.9 C)  SpO2: 99% 100%    Physical Exam:  General:  Jaundice, lethargic, oriented x 3  Head:  Normocephalic, without obvious abnormality, atraumatic  Eyes:  Deep icterus, EOMs intact  Lungs:   Clear to auscultation bilaterally, mild tachypnea  Heart:  Mildly tachycardic with regular rhythm, S1, S2 normal  Abdomen:   Moderately distended but soft, non-tender, bowel sounds active all four quadrants,  no peritoneal signs  Extremities: Extremities normal, atraumatic, no  edema  Pulses: 2+ and symmetric    Lab Results: Recent Labs    12/15/19 0310 12/15/19 0310 12/16/19 0132 12/17/19 0054  NA 133*   < > 136 138  K 4.5   < > 3.7 3.2*  CL 102   < > 104 103  CO2 16*   < > 22 23  GLUCOSE 166*   < > 127* 116*  BUN 37*   < > 32* 22*  CREATININE 1.03   < > 0.77 0.74  CALCIUM 8.1*   < > 8.1* 8.0*  MG 1.2*  --  1.7  --   PHOS 5.3*  --  3.4  --    < > = values in this interval not displayed.   Recent Labs    12/15/19 0310 12/17/19 0054  AST 95* 56*  ALT 47* 30  ALKPHOS 51 45  BILITOT 6.9* 9.9*  PROT 4.8* 5.3*  ALBUMIN 1.7* 2.5*   Recent Labs    12/17/19 0657 12/17/19 0941  WBC 8.0 10.0  HGB 6.5* 7.5*  HCT 18.6* 22.3*  MCV 87.7 87.8  PLT 29* 34*   Recent  Labs    12/16/19 1152 12/17/19 0054  LABPROT 27.0* 27.9*  INR 2.6* 2.7*    Assessment: Variceal Bleeding: No current signs of active bleeding, had a brown bowel movement today.    -EGD 11/21: Grade I esophageal varices, gastric varices at the GE junction in hiatal hernia pouch, oozing blood, banded. Clotted blood in the gastric fundus. -IR consulted, patient not a TIPS candidate -Hgb 7.5, received 1u pRBCs due to Hgb decreased to 6.6 yesterday. Will receive another 1u pRBCs -BUN 32, elevated out of proportion to creatinine but decreased from 37 yesterday  Decompensated cirrhosis: MELD score of 28 as of 12/15/19.  -thrombocytopenia: platelets 30K/uL today -INR 2.7 today -T. Bili 9.9/AST 56/ALT 30/ALP 45   Hepatic encephalopathy, resolved  Plan: Continue octreotide drip and Protonix IV BID.  Transfuse 2u platelets today.  Continue lactulose 20g TID, titrated for 2-3 soft BMs/day/  Discussed with pharmacy, OK to discontinue Rifaximin, though recommend re-initiation if mental status worsens.  Continue to monitor H&H with transfusion as needed to maintain Hgb >7.  [facility] will follow.  Salley Slaughter PA-C 12/17/2019, 10:01 AM  Contact #  [phone]

## 2019-12-17 NOTE — Progress Notes (Signed)
eLink Physician-Brief Progress Note Patient Name: KEYONTA BARRADAS DOB: January 03, 1976 MRN: [id number]   Date of Service  12/17/2019  HPI/Events of Note  Hypokalemia - K+ = 3.2 and Creatinine = 0.74.  eICU Interventions  Will replace K+.     Intervention Category Major Interventions: Electrolyte abnormality - evaluation and management  Saniyya Gau [place] 12/17/2019, 2:21 AM

## 2019-12-18 ENCOUNTER — Telehealth: Payer: Self-pay

## 2019-12-18 LAB — COMPREHENSIVE METABOLIC PANEL
ALT: 21 U/L (ref 0–44)
AST: 45 U/L — ABNORMAL HIGH (ref 15–41)
Albumin: 2.6 g/dL — ABNORMAL LOW (ref 3.5–5.0)
Alkaline Phosphatase: 41 U/L (ref 38–126)
Anion gap: 10 (ref 5–15)
BUN: 12 mg/dL (ref 6–20)
CO2: 23 mmol/L (ref 22–32)
Calcium: 7.8 mg/dL — ABNORMAL LOW (ref 8.9–10.3)
Chloride: 100 mmol/L (ref 98–111)
Creatinine, Ser: 0.69 mg/dL (ref 0.61–1.24)
GFR, Estimated: >60 mL/min (ref >60)
Glucose, Bld: 142 mg/dL — ABNORMAL HIGH (ref 70–99)
Potassium: 2.8 mmol/L — ABNORMAL LOW (ref 3.5–5.1)
Sodium: 133 mmol/L — ABNORMAL LOW (ref 135–145)
Total Bilirubin: 12.7 mg/dL — ABNORMAL HIGH (ref 0.3–1.2)
Total Protein: 5.1 g/dL — ABNORMAL LOW (ref 6.5–8.1)

## 2019-12-18 LAB — TYPE AND SCREEN
ABO/RH(D): AB POS
Antibody Screen: NEGATIVE
Unit division: 0
Unit division: 0

## 2019-12-18 LAB — GLUCOSE, CAPILLARY
Glucose-Capillary: 122 mg/dL — ABNORMAL HIGH (ref 70–99)
Glucose-Capillary: 68 mg/dL — ABNORMAL LOW (ref 70–99)
Glucose-Capillary: 151 mg/dL — ABNORMAL HIGH (ref 70–99)
Glucose-Capillary: 134 mg/dL — ABNORMAL HIGH (ref 70–99)
Glucose-Capillary: 131 mg/dL — ABNORMAL HIGH (ref 70–99)
Glucose-Capillary: 167 mg/dL — ABNORMAL HIGH (ref 70–99)

## 2019-12-18 LAB — BPAM PLATELET PHERESIS
Blood Product Expiration Date: 202111242359
Blood Product Expiration Date: 202111252359
ISSUE DATE / TIME: 202111231247
ISSUE DATE / TIME: 202111231310
Unit Type and Rh: 6200
Unit Type and Rh: 7300

## 2019-12-18 LAB — CBC
HCT: 22.6 % — ABNORMAL LOW (ref 39.0–52.0)
Hemoglobin: 7.6 g/dL — ABNORMAL LOW (ref 13.0–17.0)
MCH: 29.1 pg (ref 26.0–34.0)
MCHC: 33.6 g/dL (ref 30.0–36.0)
MCV: 86.6 fL (ref 80.0–100.0)
Platelets: 50 10*3/uL — ABNORMAL LOW (ref 150–400)
RBC: 2.61 MIL/uL — ABNORMAL LOW (ref 4.22–5.81)
RDW: 21 % — ABNORMAL HIGH (ref 11.5–15.5)
WBC: 8 10*3/uL (ref 4.0–10.5)
nRBC: 0 % (ref 0.0–0.2)

## 2019-12-18 LAB — PROTIME-INR
INR: 3.1 — ABNORMAL HIGH (ref 0.8–1.2)
Prothrombin Time: 30.6 seconds — ABNORMAL HIGH (ref 11.4–15.2)

## 2019-12-18 LAB — PREPARE PLATELET PHERESIS
Unit division: 0
Unit division: 0

## 2019-12-18 LAB — BPAM RBC
Blood Product Expiration Date: 202112142359
Blood Product Expiration Date: 202112152359
ISSUE DATE / TIME: 202111221926
ISSUE DATE / TIME: 202111230950
Unit Type and Rh: 8400
Unit Type and Rh: 8400

## 2019-12-18 LAB — MAGNESIUM: Magnesium: 1.7 mg/dL (ref 1.7–2.4)

## 2019-12-18 LAB — POTASSIUM: Potassium: 3.7 mmol/L (ref 3.5–5.1)

## 2019-12-18 MED ORDER — MAGNESIUM SULFATE 2 GM/50ML IV SOLN
2.0000 g | Freq: Once | INTRAVENOUS | Status: AC
  Administered 2019-12-18: 2 g via INTRAVENOUS
  Filled 2019-12-18: qty 50

## 2019-12-18 MED ORDER — FUROSEMIDE 40 MG PO TABS
20.0000 mg | ORAL_TABLET | Freq: Every day | ORAL | Status: DC
  Administered 2019-12-18: 20 mg via ORAL
  Filled 2019-12-18: qty 1

## 2019-12-18 MED ORDER — POTASSIUM CHLORIDE CRYS ER 20 MEQ PO TBCR
40.0000 meq | EXTENDED_RELEASE_TABLET | ORAL | Status: AC
  Administered 2019-12-18 (×3): 40 meq via ORAL
  Filled 2019-12-18: qty 4
  Filled 2019-12-18 (×2): qty 2

## 2019-12-18 MED ORDER — SPIRONOLACTONE 25 MG PO TABS
25.0000 mg | ORAL_TABLET | Freq: Every day | ORAL | Status: DC
  Administered 2019-12-18 – 2019-12-19 (×2): 25 mg via ORAL
  Filled 2019-12-18 (×2): qty 1

## 2019-12-18 MED ORDER — INSULIN ASPART 100 UNIT/ML ~~LOC~~ SOLN
0.0000 [IU] | SUBCUTANEOUS | Status: DC
  Administered 2019-12-18: 1 [IU] via SUBCUTANEOUS

## 2019-12-18 NOTE — Telephone Encounter (Signed)
Fax from [facility] received asking for an alternative medication for Oxycodone as PA is needed for 4 times daily. Ottis Stain

## 2019-12-18 NOTE — Evaluation (Signed)
Occupational Therapy Evaluation Patient Details Name: Joshua Hubbard MRN: [id number] DOB: Oct 16, 1975 Today's Date: 12/18/2019    History of Present Illness 44 y.o. male admitted on 12/14/19 for hematemesis.  Dx with hemorrhagic shock in the setting of UGIB, ABLA s/p multiple units of blood, coagulopathy in the setting of cirrhosis, acute metabolic encepholopathy, lactic acidosis, hyponatremia, hyperglycemia.  Pt underwent band ligation on 12/15/19.  May go for a paracentesis 12/19/19.  Pt with significant PMH of L knee ACL and meniscus tears (s/p knee surgery in 2020), HTN, cirrhosis.   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 At time of eval, pt presented with ability to complete mobility and ADL at mod I level. Upon arrival, pt completing toilet transfer safely without external assist. He was then able to complete functional mobility a short community distance without LOB or safety concern and VSS on RA. Pt reports cognitive deficits on arrival that he feels are clearing. Noted some slower processing, but pt has awareness of this. Educated pt on doing cognitive stimulating activities (crosswords, number puzzles) during recovery prior to returning to work. Pt is at baseline level of ADLs and no further OT intervention is needed at this time. OT Will sign off, thank you for this consult.     Follow Up Recommendations  No OT follow up;Supervision - Intermittent    Equipment Recommendations  None recommended by OT    Recommendations for Other Services       Precautions / Restrictions Precautions Precautions: None Restrictions Weight Bearing Restrictions: No      Mobility Bed Mobility Overal bed mobility: Modified Independent                  Transfers Overall transfer level: Modified independent   Transfers: Sit to/from Stand Sit to Stand: Supervision         General transfer comment: supervision  mostly for line management.    Balance Overall balance assessment: Mild deficits observed, not formally tested                                         ADL either performed or assessed with clinical judgement   ADL Overall ADL's : Modified independent                                       General ADL Comments: Pt demonstrates ability to complete ADL at mod I level. Pt completed toilet transfer, functional mobility, and bed mobility all without external assist from OT. Educated pt on slowly increasing activity level back to baseline.     Vision   Additional Comments: jaundiced sclera     Perception     Praxis      Pertinent Vitals/Pain Pain Assessment: No/denies pain     Hand Dominance Right   Extremity/Trunk Assessment Upper Extremity Assessment Upper Extremity Assessment: Overall WFL for tasks assessed   Lower Extremity Assessment Lower Extremity Assessment: Overall WFL for tasks assessed   Cervical / Trunk Assessment Cervical / Trunk Assessment: Normal   Communication Communication Communication: No difficulties   Cognition Arousal/Alertness: Awake/alert Behavior During Therapy: WFL for tasks assessed/performed Overall Cognitive Status: Within Functional Limits for tasks assessed Area of Impairment: Problem solving  Problem Solving: Slow processing General Comments: Overall WFL. Noted some slow processing with multistep commands. Pt is aware of this. He is also aware that he did not remember when he came in and needed support from staff due to poor cognition. Encouraged pt to engage in cognitively stimulating activities for return to baseline with technical job   General Comments  O2 sats and HR stable on RA throughout mobility.     Exercises     Shoulder Instructions      Home Living Family/patient expects to be discharged to:: Private residence Living Arrangements: Alone Available  Help at Discharge: Family;Available 24 hours/day Type of Home: House Home Access: Stairs to enter [employer] of Steps: 2 Entrance Stairs-Rails: None Home Layout: Two level;Able to live on main level with bedroom/bathroom     Bathroom Shower/Tub: Teacher, early years/pre: Standard     Home Equipment: None   Additional Comments: Plans to go to mother and fathers home for recovery time      Prior Functioning/Environment Level of Independence: Independent        Comments: works full time, Teaching laboratory technician job. Has two daughters 18 and 45 y.o.         OT Problem List: Decreased knowledge of use of DME or AE;Decreased activity tolerance      OT Treatment/Interventions:      OT Goals(Current goals can be found in the care plan section) Acute Rehab OT Goals Patient Stated Goal: to go home in a few days and recover with his mom and dad. OT Goal Formulation: All assessment and education complete, DC therapy  OT Frequency:     Barriers to D/C:            Co-evaluation              AM-PAC OT "6 Clicks" Daily Activity     Outcome Measure Help from another person eating meals?: None Help from another person taking care of personal grooming?: None Help from another person toileting, which includes using toliet, bedpan, or urinal?: None Help from another person bathing (including washing, rinsing, drying)?: None Help from another person to put on and taking off regular upper body clothing?: None Help from another person to put on and taking off regular lower body clothing?: None 6 Click Score: 24   End of Session Nurse Communication: Mobility status  Activity Tolerance: Patient tolerated treatment well Patient left: in bed;with call bell/phone within reach;with family/visitor present  OT Visit Diagnosis: Other abnormalities of gait and mobility (R26.89)                Time: 4825-0037 OT Time Calculation (min): 14 min Charges:  OT General Charges $OT  Visit: 1 Visit OT Evaluation $OT Eval Low Complexity: 1 Low  Zenovia Jarred, MSOT, OTR/L Acute Rehabilitation Services [facility] Office Number: [phone] Pager: [phone]  Zenovia Jarred 12/18/2019, 6:17 PM

## 2019-12-18 NOTE — Evaluation (Signed)
Physical Therapy Evaluation Patient Details Name: Joshua Hubbard MRN: [id number] DOB: 19-Jul-1975 Today's Date: 12/18/2019   History of Present Illness  44 y.o. male admitted on 12/14/19 for hematemesis.  Dx with hemorrhagic shock in the setting of UGIB, ABLA s/p multiple units of blood, coagulopathy in the setting of cirrhosis, acute metabolic encepholopathy, lactic acidosis, hyponatremia, hyperglycemia.  Pt underwent band ligation on 12/15/19.  May go for a paracentesis 12/19/19.  Pt with significant PMH of L knee ACL and meniscus tears (s/p knee surgery in 2020), HTN, cirrhosis.  Clinical Impression  Pt was able to walk two laps around the RN station holding to the IV pole for support.  Gait speed a bit slow and presentation a bit shaky, but I believe the more that Joshua Hubbard gets up and moves and walks the more steady he will be.  His father was present throughout most of our session and reinforces that Joshua Hubbard will go home to their home at d/c.  He will likely not need any follow up and further sessions of PT will be to assess higher level balance and stairs.  PT will continue to follow acutely for safe mobility progression.    Follow Up Recommendations No PT follow up    Equipment Recommendations  None recommended by PT    Recommendations for Other Services       Precautions / Restrictions        Mobility  Bed Mobility Overal bed mobility: Modified Independent                  Transfers Overall transfer level: Needs assistance   Transfers: Sit to/from Stand Sit to Stand: Supervision         General transfer comment: supervision mostly for line management.  Ambulation/Gait Ambulation/Gait assistance: Supervision Gait Distance (Feet): 260 Feet Assistive device: IV Pole Gait Pattern/deviations: WFL(Within Functional Limits) Gait velocity: decreased Gait velocity interpretation: 1.31 - 2.62 ft/sec, indicative of limited community ambulator General Gait Details: slow gait  pattern, not holding on tight to IV pole, so I don't think it was providing balance assistance.   Stairs            Wheelchair Mobility    Modified Rankin (Stroke Patients Only)       Balance Overall balance assessment: Mild deficits observed, not formally tested                                           Pertinent Vitals/Pain Pain Assessment: No/denies pain    Home Living Family/patient expects to be discharged to:: Private residence Living Arrangements: Alone Available Help at Discharge: Family;Available 24 hours/day (plans to go to his mom and dad's home to recover. 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      Hand Dominance   Dominant Hand: Right    Extremity/Trunk Assessment   Upper Extremity Assessment Upper Extremity Assessment: Defer to OT evaluation    Lower Extremity Assessment Lower Extremity Assessment: Overall WFL for tasks assessed    Cervical / Trunk Assessment Cervical / Trunk Assessment:  Normal  Communication   Communication: No difficulties  Cognition Arousal/Alertness: Awake/alert Behavior During Therapy: WFL for tasks assessed/performed Overall Cognitive Status: Within Functional Limits for tasks assessed                                 General Comments: Not specifically tested, but conversation normal.        General Comments General comments (skin integrity, edema, etc.): O2 sats and HR stable on RA throughout mobility.     Exercises     Assessment/Plan    PT Assessment Patient needs continued PT services  PT Problem List Decreased strength;Decreased activity  tolerance;Decreased mobility;Decreased balance;Decreased knowledge of use of DME       PT Treatment Interventions DME instruction;Gait training;Stair training;Functional mobility training;Therapeutic activities;Therapeutic exercise;Balance training;Neuromuscular re-education;Cognitive remediation;Patient/family education    PT Goals (Current goals can be found in the Care Plan section)  Acute Rehab PT Goals Patient Stated Goal: to go home in a few days and recover with his mom and dad. PT Goal Formulation: With patient Time For Goal Achievement: 01/01/20 Potential to Achieve Goals: Good    Frequency Min 3X/week   Barriers to discharge        Co-evaluation               AM-PAC PT "6 Clicks" Mobility  Outcome Measure Help needed turning from your back to your side while in a flat bed without using bedrails?: None Help needed moving from lying on your back to sitting on the side of a flat bed without using bedrails?: None Help needed moving to and from a bed to a chair (including a wheelchair)?: None Help needed standing up from a chair using your arms (e.g., wheelchair or bedside chair)?: None Help needed to walk in hospital room?: None Help needed climbing 3-5 steps with a railing? : A Little 6 Click Score: 23    End of Session   Activity Tolerance: Patient limited by fatigue Patient left: in bed;Other (comment) (seated EOB, OT coming in for OT eval) Nurse Communication: Mobility status PT Visit Diagnosis: Muscle weakness (generalized) (M62.81);Difficulty in walking, not elsewhere classified (R26.2)    Time: 1540-1600 PT Time Calculation (min) (ACUTE ONLY): 20 min   Charges:   PT Evaluation $PT Eval Moderate Complexity: [place], PT, DPT  Acute Rehabilitation [phone] pager [phone]) [phone] office

## 2019-12-18 NOTE — Progress Notes (Signed)
° ° °  Please reach out to [facility] inpatient teaching service when this patient is ready to transition from ICU care to med/surg bed.  Family Medicine Inpatient Service Attending available on [facility]ION and service pager :[phone]  Progress update: Patient well known to me, initially admitted to our inpatient service. Will ultimately be discharged to my care at the [facility]. I have seen and examined, reviewed chart, appreciate excellent care from ICU team.  1. Upper GI bleed in setting of acute decompensation alcoholic cirrhosis S/p EGD with banding  Multiple transfusions:as of 12/17/19 0925  Transfuse platelet pheresis  Tranfuse 2 units     12/17/19 0925 12/17/19 0821  Transfuse RBC  Tranfuse 1 unit       12/17/19 0821 12/16/19 1550  Transfuse RBC  Tranfuse 1 unit       12/16/19 1548 12/15/19 0549  Transfuse RBC  Tranfuse 2 units     12/15/19 0549 12/14/19 1934  Transfuse RBC  Tranfuse 1 unit       12/14/19 1934 12/14/19 1915  Transfuse fresh frozen plasma  Tranfuse 4 units     12/14/19 1915 12/14/19 1904  Transfuse platelet pheresis  Tranfuse 1 unit       12/14/19 1904 12/14/19 1753  Transfuse RBC  Tranfuse 1 unit       12/14/19 1752   Total to date PRBC: 6 units Platelets: 3 units FFP: 4 units  GI following and they mentioned he may need paracentesis Friday as he continues to have abdominal distension INR  Still elevated at 3.1, down from a high of 4.0 Bilirubin continues to rise: 12.7  2. Encephalopathy Likely related to hepatic failure as well as complicated by el;ectrolyte disturbances Mentation much clearer today. Able to carry on clear conversation with me, where yesterday he was aler but focus was limited and day before he was responsive only to pain  3. Electrolyte disturbances improving, has required some continued replacement  4. Recent ETOH use; began discussion with [name]. We may want to consider inpatient treatment upon medical discharge. I will be happy  to contiue this conversation with him and make arrangements with the team as we get closer to discharge.

## 2019-12-18 NOTE — Progress Notes (Signed)
[facility] Progress Note  Joshua Hubbard 44 y.o. 1975-09-06  CC:  Decompensated cirrhosis, variceal bleeding   Subjective: Patient is awake and oriented x 3.  He denies any abdominal pain, nausea, or vomiting.  He denies melena or hematochezia, reports he is having brown bowel movements.  He is tolerating clear liquids.  ROS : Review of Systems  Unable to perform ROS: Mental status change  Cardiovascular: Negative for chest pain and palpitations.  Gastrointestinal: Negative for abdominal pain, blood in stool, constipation, diarrhea, heartburn, melena, nausea and vomiting.  b Objective: Vital signs in last 24 hours: Vitals:   12/18/19 0759 12/18/19 0800  BP:    Pulse:  99  Resp:  (!) 21  Temp: 97.8 F (36.6 C)   SpO2:  97%    Physical Exam:  General:  Jaundice, awake, oriented x 3  Head:  Normocephalic, without obvious abnormality, atraumatic  Eyes:  Deep icterus, EOMs intact  Lungs:   Clear to auscultation bilaterally, mild tachypnea  Heart:  Mildly tachycardic with regular rhythm, S1, S2 normal  Abdomen:   Moderately distended but soft, non-tender, bowel sounds active all four quadrants,  no peritoneal signs  Extremities: Extremities normal, atraumatic, no  edema  Pulses: 2+ and symmetric    Lab Results: Recent Labs    12/16/19 0132 12/16/19 0132 12/17/19 0054 12/18/19 0142  NA 136   < > 138 133*  K 3.7   < > 3.2* 2.8*  CL 104   < > 103 100  CO2 22   < > 23 23  GLUCOSE 127*   < > 116* 142*  BUN 32*   < > 22* 12  CREATININE 0.77   < > 0.74 0.69  CALCIUM 8.1*   < > 8.0* 7.8*  MG 1.7  --   --  1.7  PHOS 3.4  --   --   --    < > = values in this interval not displayed.   Recent Labs    12/17/19 0054 12/18/19 0142  AST 56* 45*  ALT 30 21  ALKPHOS 45 41  BILITOT 9.9* 12.7*  PROT 5.3* 5.1*  ALBUMIN 2.5* 2.6*   Recent Labs    12/17/19 0941 12/18/19 0142  WBC 10.0 8.0  HGB 7.5* 7.6*  HCT 22.3* 22.6*  MCV 87.8 86.6  PLT 34* 50*   Recent  Labs    12/17/19 0054 12/18/19 0142  LABPROT 27.9* 30.6*  INR 2.7* 3.1*    Assessment: Variceal Bleeding: No current signs of active bleeding, having brown bowel movements -EGD 11/21: Grade I esophageal varices, gastric varices at the GE junction in hiatal hernia pouch, oozing blood, banded. Clotted blood in the gastric fundus. -IR consulted, patient not a TIPS candidate -Hgb 7.5, received 1u pRBCs due to Hgb decreased to 6.6 yesterday. Will receive another 1u pRBCs -BUN decreased now 12, suggestive of resolution of upper GI bleeding  Decompensated cirrhosis: MELD score of 28 as of 12/15/19.  -thrombocytopenia: platelets 50K/uL today, improved from 30K/uL after 2u platelets -INR 3.1 today -T. Bili 12.7/AST 45/ALT 21/ALP 41  Hepatic encephalopathy, resolved  Plan: Continue octreotide drip and Protonix IV BID.  We will continue octreotide for at least 1 more day, for a minimum of 5 days.  Continue lactulose 20g TID, titrated for 2-3 soft BMs/day  Continue to monitor H&H with transfusion as needed to maintain Hgb >7.  Soft diet, 2g sodium restricted.  [facility] will follow.  Salley Slaughter PA-C 12/18/2019, 9:46  AM  Contact #  [phone]

## 2019-12-18 NOTE — Progress Notes (Signed)
PIV consult: Pt with extensive bruising to RUE.  L wrist site patent, infusing. Recommend pausing infusion to administer Q 12 hour IV pushes to preserve veins. Pt has limited PIV options.

## 2019-12-18 NOTE — Progress Notes (Signed)
eLink Physician-Brief Progress Note Patient Name: Joshua Hubbard DOB: 12/04/1975 MRN: [id number]   Date of Service  12/18/2019  HPI/Events of Note  Notified of K 2.8, creatinine 0.69 Hgb 7.6 from 7.5  eICU Interventions  Ordered K 40 meqs PO q 4 x 3 doses Add on Mg level     Intervention Category Intermediate Interventions: Electrolyte abnormality - evaluation and management  Judd Lien 12/18/2019, 4:38 AM

## 2019-12-18 NOTE — Telephone Encounter (Signed)
We will decline to refill this at this time [facility]! Dorcas Mcmurray

## 2019-12-18 NOTE — Progress Notes (Addendum)
12/18/2019 I saw and evaluated the patient. Discussed with resident and agree with resident's findings and plan as documented in the resident's note.  I have seen and evaluated the patient for UGIB.  S:  No events, stool is brown.  Denies pain.  O: Blood pressure 117/60, pulse 99, temperature 97.8 F (36.6 C), temperature source Oral, resp. rate (!) 21, height 5\' 9"  (1.753 m), weight 75 kg, SpO2 97 %.  No acute distress Abd soft with +BS Lungs clear No asterixis AOx3  Plts/HgB improved with transfusion INR remains high  A:  Variceal bleed s/p multiple transfusions EtOH cirrhosis with recent active EtOH use Encephalopathy after bleed improved Muscular deconditioning  P:  - Trend H/H, transfusion goals and octreotide per GI service - Ceftriaxone for SBP ppx - Given HD stability, okay for transfer to stepdown, appreciate [facility] taking over care, remaining issues include transfusion-free Hgb stability, PT evaluation    12/18/2019 Erskine Emery MD     NAME:  Joshua Hubbard, MRN:  [id number], DOB:  [birth year], LOS: 4 ADMISSION DATE:  12/14/2019, CONSULTATION DATE:  12/14/2019 REFERRING MD:  Zettie Cooley, FPTS, CHIEF COMPLAINT:  Hematemesis   Brief History   44 yo male with hx of ETOH presented to [facility] with nausea, vomiting and diarrhea for 1 day prior to admission.  Has hx of cirrhosis with esophageal varices.  Found to have elevated ETOH level (23), ammonia (62), and lactic acid (5.7).  Developed hematemesis and progressive anemia causing hypotension.  He was transferred to ICU and PCCM assumed care.  History of present illness   Pt confused and not able to provide additional history.   Past Medical History  HTN, ETOH with cirrhosis, esophageal varices, Chronic opiate use, Chronic benzo use, Depression  Significant Hospital Events   11/20 Admit to FPTS, transfer to ICU  Consults:  [facility]  Procedures:  11/21 EGD with variceal clipping  Significant Diagnostic Tests:   CT abd/pelvis 11/20 >> large esophageal varices, changes of cirrhosis, multiple gallstones, distended GB, splenomegaly 15.9 cm, mod ascites  EGD 11/21  Micro Data:  COVID/flu 11/20 >> negative MRSA PCR 11/20 >> negative Blood 11/20 >>  Antimicrobials:  Ceftriaxone x 5 days  Interim history/subjective:  No acute overnight events. Patient awake and sitting up in bed feeling well, mentation continues to improve does not appear lethargic or confused. Multiple bowel movements since yesterday. He has not had further bloody and dark bowel movements. Denies abdominal pain. Discussed plan to transfer out of ICU today which he understands and is agreeable to.  Objective   Blood pressure 117/60, pulse 81, temperature 99.8 F (37.7 C), temperature source Oral, resp. rate (!) 23, height 5\' 9"  (1.753 m), weight 75 kg, SpO2 97 %.        Intake/Output Summary (Last 24 hours) at 12/18/2019 0749 Last data filed at 12/18/2019 0600 Gross per 24 hour  Intake 4250.96 ml  Output 4 ml  Net 4246.96 ml   Filed Weights   12/14/19 0914 12/17/19 0500  Weight: 74.8 kg 75 kg   Examination:  Constitutional: Sitting up in bed awake, no acute distress Eyes: scleral icterus, pupils equal Ears, nose, mouth, and throat: mucous membranes moist, no further blood on lips Cardiovascular: S1, S2 regular rate and rhythm, no edema Respiratory: CTA, no accessory muscle use Gastrointestinal: Mild distention, soft, nontender, +BS, tympanic throughout Skin: Jaundice, normal turgor Neurologic: AxOx3, no focal weakness Psychiatric: cooperative, normal mood  Resolved Hospital Problem list  Assessment & Plan:   Hemorrhagic shock in setting of Upper GI bleeding with hx of decompensated alcoholic cirrhosis, ascites, and esophageal varices.  Improved Acute blood loss anemia from GI bleeding. Coagulopathy in setting of cirrhosis. Continues to be stable, no further bleeding symptoms. Increase in INR 3.1 and  Bili12.7, liver enzymes decreasing. Hgb 7.6 after 1 unit pRBC yesterday, platelets 50 after 2 units platelets. Meld continues to be high at 30.  Not a liver transplant candidate with recent EtOH use (23 on admission). EGD 11/21 showing multiple bleeding esophageal varices sp banding. - GI consulted, greatly appreciate reccomendations - Continue PPI - Consider stopping octreotide today, per GI - Continue to monitor hgb - Ceftriaxone x 5 days for SBP ppx - PT/OT - Stable for transfer to progressive unit, family medicine residency program to assume care tomorrow  Acute metabolic encephalopathy with concern for developing delirium tremens. Suspect hepatic encephalopathy Chronic opiate/benzo use. - Mentation well this morning - CIWA q4h with prn ativan for CIWA > 8 - Continue lactulose  - continue to hold outpt wellbutrin, klonopin, oxycontin  Hypokalemia  Potassium continues to be low at 2.8. Mg of 1.7. -Replace K and Mg  Best practice:  Diet: Clear liquid diet DVT prophylaxis: SCDs GI prophylaxis: Protonix Mobility: bed rest Code Status: full Disposition: Transfer to progressive unit  Iona Beard PGY-1 Internal medicine 12/18/19 7:49 AM

## 2019-12-19 DIAGNOSIS — K7031 Alcoholic cirrhosis of liver with ascites: Secondary | ICD-10-CM | POA: Diagnosis not present

## 2019-12-19 DIAGNOSIS — R531 Weakness: Secondary | ICD-10-CM | POA: Diagnosis not present

## 2019-12-19 DIAGNOSIS — I8511 Secondary esophageal varices with bleeding: Secondary | ICD-10-CM | POA: Diagnosis not present

## 2019-12-19 LAB — CULTURE, BLOOD (ROUTINE X 2)
Culture: NO GROWTH
Culture: NO GROWTH
Special Requests: ADEQUATE

## 2019-12-19 LAB — GLUCOSE, CAPILLARY
Glucose-Capillary: 106 mg/dL — ABNORMAL HIGH (ref 70–99)
Glucose-Capillary: 108 mg/dL — ABNORMAL HIGH (ref 70–99)
Glucose-Capillary: 117 mg/dL — ABNORMAL HIGH (ref 70–99)
Glucose-Capillary: 119 mg/dL — ABNORMAL HIGH (ref 70–99)
Glucose-Capillary: 149 mg/dL — ABNORMAL HIGH (ref 70–99)
Glucose-Capillary: 86 mg/dL (ref 70–99)

## 2019-12-19 LAB — COMPREHENSIVE METABOLIC PANEL
ALT: 27 U/L (ref 0–44)
AST: 62 U/L — ABNORMAL HIGH (ref 15–41)
Albumin: 2.5 g/dL — ABNORMAL LOW (ref 3.5–5.0)
Alkaline Phosphatase: 43 U/L (ref 38–126)
Anion gap: 10 (ref 5–15)
BUN: 7 mg/dL (ref 6–20)
CO2: 21 mmol/L — ABNORMAL LOW (ref 22–32)
Calcium: 7.7 mg/dL — ABNORMAL LOW (ref 8.9–10.3)
Chloride: 102 mmol/L (ref 98–111)
Creatinine, Ser: 0.55 mg/dL — ABNORMAL LOW (ref 0.61–1.24)
GFR, Estimated: >60 mL/min (ref >60)
Glucose, Bld: 110 mg/dL — ABNORMAL HIGH (ref 70–99)
Potassium: 3.8 mmol/L (ref 3.5–5.1)
Sodium: 133 mmol/L — ABNORMAL LOW (ref 135–145)
Total Bilirubin: 15.8 mg/dL — ABNORMAL HIGH (ref 0.3–1.2)
Total Protein: 5.3 g/dL — ABNORMAL LOW (ref 6.5–8.1)

## 2019-12-19 LAB — CBC
HCT: 23.1 % — ABNORMAL LOW (ref 39.0–52.0)
Hemoglobin: 7.7 g/dL — ABNORMAL LOW (ref 13.0–17.0)
MCH: 30.1 pg (ref 26.0–34.0)
MCHC: 33.3 g/dL (ref 30.0–36.0)
MCV: 90.2 fL (ref 80.0–100.0)
Platelets: 51 10*3/uL — ABNORMAL LOW (ref 150–400)
RBC: 2.56 MIL/uL — ABNORMAL LOW (ref 4.22–5.81)
RDW: 22.5 % — ABNORMAL HIGH (ref 11.5–15.5)
WBC: 8.1 10*3/uL (ref 4.0–10.5)
nRBC: 0 % (ref 0.0–0.2)

## 2019-12-19 LAB — PROTIME-INR
INR: 2.9 — ABNORMAL HIGH (ref 0.8–1.2)
Prothrombin Time: 29.5 seconds — ABNORMAL HIGH (ref 11.4–15.2)

## 2019-12-19 MED ORDER — HYDROXYZINE HCL 25 MG PO TABS
50.0000 mg | ORAL_TABLET | Freq: Three times a day (TID) | ORAL | Status: DC | PRN
  Administered 2019-12-19 – 2019-12-20 (×3): 50 mg via ORAL
  Filled 2019-12-19 (×4): qty 2

## 2019-12-19 MED ORDER — PANTOPRAZOLE SODIUM 40 MG PO TBEC
40.0000 mg | DELAYED_RELEASE_TABLET | Freq: Two times a day (BID) | ORAL | Status: DC
  Administered 2019-12-19 – 2019-12-21 (×4): 40 mg via ORAL
  Filled 2019-12-19 (×4): qty 1

## 2019-12-19 MED ORDER — SODIUM CHLORIDE 0.9 % IV SOLN
2.0000 g | INTRAVENOUS | Status: DC
  Administered 2019-12-19 – 2019-12-20 (×2): 2 g via INTRAVENOUS
  Filled 2019-12-19: qty 20
  Filled 2019-12-19 (×2): qty 2

## 2019-12-19 MED ORDER — SPIRONOLACTONE 25 MG PO TABS
25.0000 mg | ORAL_TABLET | Freq: Once | ORAL | Status: AC
  Administered 2019-12-19: 25 mg via ORAL
  Filled 2019-12-19: qty 1

## 2019-12-19 MED ORDER — FUROSEMIDE 20 MG PO TABS
20.0000 mg | ORAL_TABLET | Freq: Every day | ORAL | Status: DC
  Administered 2019-12-19 – 2019-12-21 (×3): 20 mg via ORAL
  Filled 2019-12-19 (×3): qty 1

## 2019-12-19 MED ORDER — MELATONIN 3 MG PO TABS
3.0000 mg | ORAL_TABLET | Freq: Once | ORAL | Status: AC
  Administered 2019-12-19: 3 mg via ORAL
  Filled 2019-12-19: qty 1

## 2019-12-19 MED ORDER — SPIRONOLACTONE 25 MG PO TABS
50.0000 mg | ORAL_TABLET | Freq: Every day | ORAL | Status: DC
  Administered 2019-12-20 – 2019-12-21 (×2): 50 mg via ORAL
  Filled 2019-12-19 (×2): qty 2

## 2019-12-19 NOTE — Progress Notes (Signed)
Family Medicine Teaching Service Daily Progress Note Intern Pager: [phone]  Patient name: Joshua Hubbard Medical record number: [id number] Date of birth: 07/01/1975 Age: 44 y.o. Gender: male  Primary Care Provider: [name], MD Consultants: GI, CCM, IR Code Status: Full code  Pt Overview and Major Events to Date:  11/20 patient admitted to ICU for upper GI bleed 11/21 EGD performed with banding of bleeding varices, IR consulted and patient is a poor candidate for TIPS 11/25 patient stable for transfer to floor status, family medicine team assumed care  Assessment and Plan: Joshua Hubbard is a 44 year old male with past medical history of decompensated liver disease, GI bleeding who presented after esophageal variceal bleed.  He was admitted to the ICU and is now stepped out to floor status.  Upper GI bleed 2/2 acute decompensated alcoholic cirrhosis with variceal bleeding Patient is s/p EGD with banding.  He has received a total of 6 units of packed red blood cells, 3 units of platelets and 4 units of FFP.  GI continues to follow this patient.  Recommended octreotide drip and Protonix.  They also recommending continuing antibiotics at this time.  Hemoglobin this morning stable at 7.7 from 7.6 yesterday.  Platelets also stable at 51 from 50.  The increase today at 62 from 45 yesterday total bilirubin continues to increase and is 18.8 from 12.7 yesterday.  INR decreased to 2.9 from 3.1 yesterday.  Patient complains of abdominal distention this morning and reports that he had a paracentesis in the past which helped with the discomfort.  Patient physical exam significant for abdominal distention along with jaundice and scleral icterus. -GI consulted, appreciate recommendations -Discontinued octreotide -Continue Protonix -Increased spironolactone to 50 mg and added Lasix 20 mg daily -Continue ceftriaxone -Continue lactulose -Vitamin K 10 mg ordered per GI -IR paracentesis ordered for  11/26 -Morning CBCs, PT/INR, CMP -Continue Lasix 20 mg and spironolactone 25 mg  Acute encephalopathy This is my first time seeing the patient but he reports he feels greatly improved from admission.  He is alert and oriented to person place and time.  He is able to carry a conversation well.  Initial encephalopathy most likely due to his liver failure as well as his electrolyte disturbances.   -Continue to monitor mental status -CIWA's every 4 with as needed Ativan for CIWA greater than 8 -Continue lactulose -Holding home Wellbutrin, Klonopin, OxyContin  Electrolyte disturbances Patient has had issues with potassium.  Potassium was repleted yesterday and is within normal limits today at 3.8. -Morning CMP is -Replete as necessary  Recent EtOH use Did not discuss at length with patient.  Per chart review he has been discussing this with our attending.  Considering possible inpatient rehab versus outpatient management.  Will discuss further prior to discharge.  FEN/GI: Soft diet PPx: Holding at this time given thrombocytopenia  Status is: Inpatient  Remains inpatient appropriate because:Inpatient level of care appropriate due to severity of illness   Dispo: The patient is from: Home              Anticipated d/c is to: Home              Anticipated d/c date is: > 3 days              Patient currently is not medically stable to d/c.   Subjective:  Patient reports he is doing better this morning.  He feels he is thinking clearer than he has in the past few  days.  Denies much pain but says that he is not comfortable due to his abdominal swelling.  He says that he has had an IR paracentesis in the past which immediately relieved his discomfort.  Objective: Temp:  [97.8 F (36.6 C)-99.8 F (37.7 C)] 98.8 F (37.1 C) (11/25 0338) Pulse Rate:  [65-104] 93 (11/25 0338) Resp:  [16-25] 19 (11/25 0338) BP: (107-136)/(58-80) 112/70 (11/25 0338) SpO2:  [86 %-100 %] 100 % (11/25  0338) Physical Exam: General: Sitting in bed in no acute distress, jaundice of skin and scleral icterus noted Cardiovascular: Regular rate and rhythm, no murmurs appreciated Respiratory: Normal work of breathing, lungs clear to auscultation bilaterally Abdomen: Distended an firm, d mildly tender diffusely.  Positive bowel sounds Extremities: No edema noted Neuro: Alert and oriented x3, no asterixis noted  Laboratory: Recent Labs  Lab 12/17/19 0941 12/18/19 0142 12/19/19 0016  WBC 10.0 8.0 8.1  HGB 7.5* 7.6* 7.7*  HCT 22.3* 22.6* 23.1*  PLT 34* 50* 51*   Recent Labs  Lab 12/17/19 0054 12/17/19 0054 12/18/19 0142 12/18/19 2057 12/19/19 0016  NA 138  --  133*  --  133*  K 3.2*   < > 2.8* 3.7 3.8  CL 103  --  100  --  102  CO2 23  --  23  --  21*  BUN 22*  --  12  --  7  CREATININE 0.74  --  0.69  --  0.55*  CALCIUM 8.0*  --  7.8*  --  7.7*  PROT 5.3*  --  5.1*  --  5.3*  BILITOT 9.9*  --  12.7*  --  15.8*  ALKPHOS 45  --  41  --  43  ALT 30  --  21  --  27  AST 56*  --  45*  --  62*  GLUCOSE 116*  --  142*  --  110*   < > = values in this interval not displayed.   PT/INR 29.5/2.9   Imaging/Diagnostic Tests: No results found.  Gifford Shave, MD 12/19/2019, 7:35 AM PGY-2, [place] Intern pager: [phone], text pages welcome

## 2019-12-19 NOTE — Progress Notes (Signed)
Paged by patient's nurse because the patient was complaining of anxiety.  Went to evaluate the patient and he was resting in his room.  Reported that he was anxious because everything is "coming to ahead".  I asked about his benzo use and he reported that he had taken clonazepam in the past for restless leg and sleep issues but that he has not taken any since May 2021.  Reports that his last alcoholic drink was 97/33.  Feel the anxiety is more social in nature then withdrawal.  Patient was given 50 mg of hydroxyzine.

## 2019-12-19 NOTE — Progress Notes (Addendum)
[facility] Progress Note  Joshua Hubbard 44 y.o. 1975-11-16  CC: Variceal bleeding, cirrhosis, ascites   Subjective: Patient seen and examined at bedside.  Feeling better.  Complaining of worsening abdominal distention.  Able to tolerate diet.  Denies any further bleeding episodes.  ROS : Negative for chest pain, shortness of breath, nausea or vomiting.   Objective: Vital signs in last 24 hours: Vitals:   12/19/19 0338 12/19/19 0730  BP: 112/70 107/64  Pulse: 93 98  Resp: 19 18  Temp: 98.8 F (37.1 C) 99.3 F (37.4 C)  SpO2: 100% 98%    Physical Exam:  General:  Alert, cooperative, no distress, appears stated age  Head:  Normocephalic, without obvious abnormality, atraumatic  Eyes:   Scleral icterus noted  Lungs:   Clear to auscultation bilaterally, respirations unlabored  Heart:  Regular rate and rhythm, S1, S2 normal  Abdomen:    Abdomen is very distended, bowel sounds present, nontender, no peritoneal signs  Extremities: Extremities normal, atraumatic, no  edema       Lab Results: Recent Labs    12/18/19 0142 12/18/19 0142 12/18/19 2057 12/19/19 0016  NA 133*  --   --  133*  K 2.8*   < > 3.7 3.8  CL 100  --   --  102  CO2 23  --   --  21*  GLUCOSE 142*  --   --  110*  BUN 12  --   --  7  CREATININE 0.69  --   --  0.55*  CALCIUM 7.8*  --   --  7.7*  MG 1.7  --   --   --    < > = values in this interval not displayed.   Recent Labs    12/18/19 0142 12/19/19 0016  AST 45* 62*  ALT 21 27  ALKPHOS 41 43  BILITOT 12.7* 15.8*  PROT 5.1* 5.3*  ALBUMIN 2.6* 2.5*   Recent Labs    12/18/19 0142 12/19/19 0016  WBC 8.0 8.1  HGB 7.6* 7.7*  HCT 22.6* 23.1*  MCV 86.6 90.2  PLT 50* 51*   Recent Labs    12/18/19 0142 12/19/19 0016  LABPROT 30.6* 29.5*  INR 3.1* 2.9*      Assessment/Plan: Assessment ---------------- -Esophageal variceal bleed. Status post band ligation on November 21. Not a good candidate for TIPS per IR. -Blood  loss anemia -Decompensated cirrhosis. Meld score of 28 -Hepatic encephalopathy -Thrombocytopenia  Recommendations ---------------------------- -DC octreotide drip.  Continue PPI. Continue antibiotics for now  -Increase spironolactone to 50 mg.  Add Lasix 20 mg. -Paracentesis tomorrow. -Transfuse PRBC as needed to keep hemoglobin around 7-8 -Continue lactulose.  -Vitamin K 10 mg -GI will follow   [name] MD, FACP 12/19/2019, 11:38 AM  Contact #  [phone]

## 2019-12-20 ENCOUNTER — Inpatient Hospital Stay (HOSPITAL_COMMUNITY): Payer: 59

## 2019-12-20 DIAGNOSIS — R531 Weakness: Secondary | ICD-10-CM | POA: Diagnosis not present

## 2019-12-20 DIAGNOSIS — K7031 Alcoholic cirrhosis of liver with ascites: Secondary | ICD-10-CM | POA: Diagnosis not present

## 2019-12-20 DIAGNOSIS — I8511 Secondary esophageal varices with bleeding: Secondary | ICD-10-CM | POA: Diagnosis not present

## 2019-12-20 HISTORY — PX: IR PARACENTESIS: IMG2679

## 2019-12-20 LAB — COMPREHENSIVE METABOLIC PANEL
ALT: 27 U/L (ref 0–44)
AST: 62 U/L — ABNORMAL HIGH (ref 15–41)
Albumin: 2.5 g/dL — ABNORMAL LOW (ref 3.5–5.0)
Alkaline Phosphatase: 56 U/L (ref 38–126)
Anion gap: 10 (ref 5–15)
BUN: 9 mg/dL (ref 6–20)
CO2: 19 mmol/L — ABNORMAL LOW (ref 22–32)
Calcium: 7.7 mg/dL — ABNORMAL LOW (ref 8.9–10.3)
Chloride: 99 mmol/L (ref 98–111)
Creatinine, Ser: 0.65 mg/dL (ref 0.61–1.24)
GFR, Estimated: >60 mL/min (ref >60)
Glucose, Bld: 90 mg/dL (ref 70–99)
Potassium: 3.4 mmol/L — ABNORMAL LOW (ref 3.5–5.1)
Sodium: 128 mmol/L — ABNORMAL LOW (ref 135–145)
Total Bilirubin: 16.6 mg/dL — ABNORMAL HIGH (ref 0.3–1.2)
Total Protein: 5.5 g/dL — ABNORMAL LOW (ref 6.5–8.1)

## 2019-12-20 LAB — GRAM STAIN

## 2019-12-20 LAB — PROTIME-INR
INR: 3.3 — ABNORMAL HIGH (ref 0.8–1.2)
Prothrombin Time: 32.3 seconds — ABNORMAL HIGH (ref 11.4–15.2)

## 2019-12-20 LAB — CBC
HCT: 23.3 % — ABNORMAL LOW (ref 39.0–52.0)
Hemoglobin: 7.7 g/dL — ABNORMAL LOW (ref 13.0–17.0)
MCH: 29.3 pg (ref 26.0–34.0)
MCHC: 33 g/dL (ref 30.0–36.0)
MCV: 88.6 fL (ref 80.0–100.0)
Platelets: 77 10*3/uL — ABNORMAL LOW (ref 150–400)
RBC: 2.63 MIL/uL — ABNORMAL LOW (ref 4.22–5.81)
RDW: 22.7 % — ABNORMAL HIGH (ref 11.5–15.5)
WBC: 9.8 10*3/uL (ref 4.0–10.5)
nRBC: 0 % (ref 0.0–0.2)

## 2019-12-20 LAB — BODY FLUID CELL COUNT WITH DIFFERENTIAL
Eos, Fluid: 1 %
Lymphs, Fluid: 51 %
Monocyte-Macrophage-Serous Fluid: 31 % — ABNORMAL LOW (ref 50–90)
Neutrophil Count, Fluid: 17 % (ref 0–25)
Total Nucleated Cell Count, Fluid: 186 cu mm (ref 0–1000)

## 2019-12-20 LAB — ALBUMIN, PLEURAL OR PERITONEAL FLUID: Albumin, Fluid: <1 g/dL

## 2019-12-20 LAB — GLUCOSE, CAPILLARY
Glucose-Capillary: 110 mg/dL — ABNORMAL HIGH (ref 70–99)
Glucose-Capillary: 101 mg/dL — ABNORMAL HIGH (ref 70–99)
Glucose-Capillary: 155 mg/dL — ABNORMAL HIGH (ref 70–99)
Glucose-Capillary: 129 mg/dL — ABNORMAL HIGH (ref 70–99)

## 2019-12-20 LAB — PROTEIN, PLEURAL OR PERITONEAL FLUID: Total protein, fluid: <3 g/dL

## 2019-12-20 IMAGING — US IR PARACENTESIS
1 series · 2 of 2 positions shown · non-contrast
Comparison: none

INDICATION: Patient with history of alcoholic cirrhosis, recurrent ascites.
Request is made for diagnostic and therapeutic paracentesis.

[Series 1: ir (id) (id)/(id)/(id) ir · 2 of 2 slices shown]
[im 1/2]
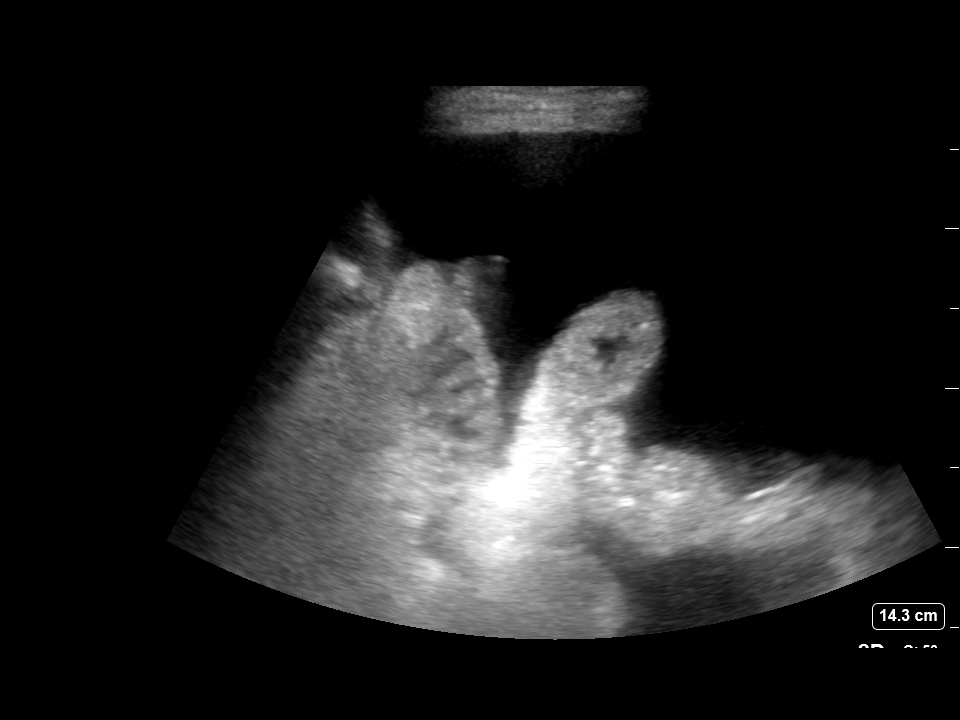
[im 2/2]
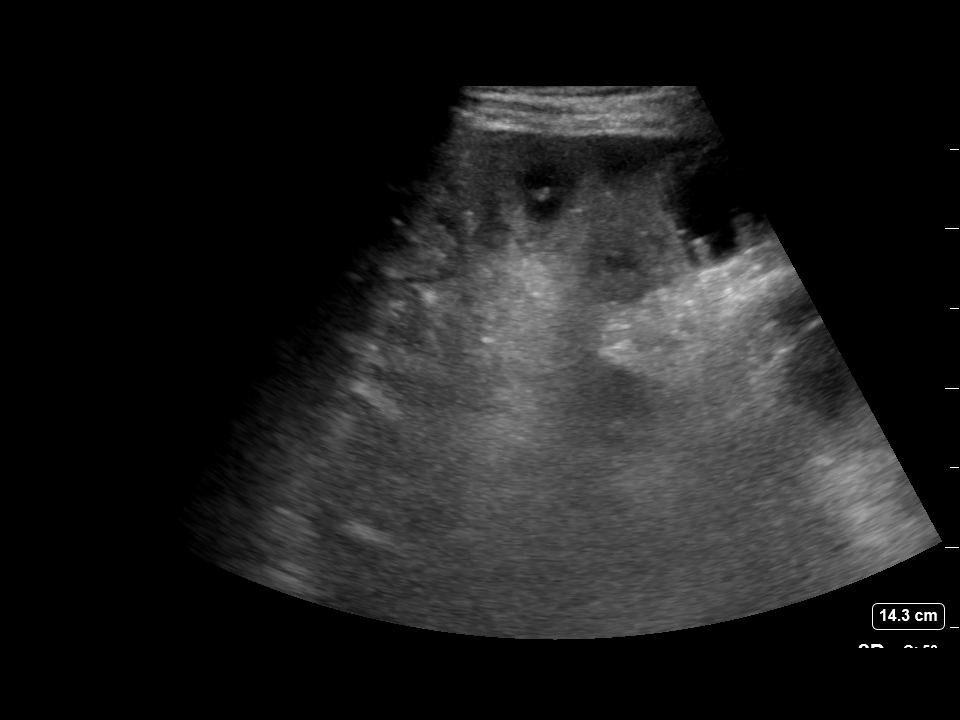

[2 of 2 positions shown; findings below may reference images not displayed]

EXAM:
ULTRASOUND GUIDED DIAGNOSTIC AND THERAPEUTIC PARACENTESIS

MEDICATIONS:
10 mL 1% lidocaine

COMPLICATIONS:
None immediate.

PROCEDURE:
Informed written consent was obtained from the patient after a
discussion of the risks, benefits and alternatives to treatment. A
timeout was performed prior to the initiation of the procedure.

Initial ultrasound scanning demonstrates a large amount of ascites
within the left lateral abdomen. The left lateral abdomen was
prepped and draped in the usual sterile fashion. 1% lidocaine was
used for local anesthesia.

Following this, a 19 gauge, 7-cm, Yueh catheter was introduced. An
ultrasound image was saved for documentation purposes. The
paracentesis was performed. The catheter was removed and a dressing
was applied. The patient tolerated the procedure well without
immediate post procedural complication.
FINDINGS: A total of approximately 5.0 liters of yellow fluid was removed.
Samples were sent to the laboratory as requested by the clinical
team.
IMPRESSION: Successful ultrasound-guided diagnostic and therapeutic paracentesis
yielding 5.0 liters of peritoneal fluid.

## 2019-12-20 MED ORDER — POTASSIUM CHLORIDE CRYS ER 20 MEQ PO TBCR
40.0000 meq | EXTENDED_RELEASE_TABLET | Freq: Once | ORAL | Status: AC
  Administered 2019-12-20: 40 meq via ORAL
  Filled 2019-12-20: qty 2

## 2019-12-20 MED ORDER — INSULIN ASPART 100 UNIT/ML ~~LOC~~ SOLN: 0.0000 [IU] | Freq: Three times a day (TID) | SUBCUTANEOUS | Status: DC

## 2019-12-20 MED ORDER — LIDOCAINE HCL 1 % IJ SOLN
INTRAMUSCULAR | Status: AC
  Filled 2019-12-20: qty 20

## 2019-12-20 NOTE — Progress Notes (Signed)
Subjective: Abdomen distended and feels "tight." No hematemesis or melena/hematochezia  Objective: Vital signs in last 24 hours: Temp:  [97.8 F (36.6 C)-100.2 F (37.9 C)] 97.8 F (36.6 C) (11/26 0500) Pulse Rate:  [86-97] 97 (11/26 1004) Resp:  [16-19] 16 (11/26 0500) BP: (101-124)/(60-69) 124/69 (11/26 1046) SpO2:  [98 %-100 %] 100 % (11/26 0500) Weight:  [81 kg] 81 kg (11/26 0500) Weight change:  Last BM Date: 12/19/19  PE: GEN:  NAD ABD:  Moderate distention, non-tender SKIN/HEENT:  Jaundiced NEURO:  No encephalopathy EXT:  2+ PE BLE  Lab Results: CBC    Component Value Date/Time   WBC 9.8 12/20/2019 0220   RBC 2.63 (L) 12/20/2019 0220   HGB 7.7 (L) 12/20/2019 0220   HGB 8.8 (L) 10/16/2019 1103   HCT 23.3 (L) 12/20/2019 0220   HCT 27.7 (L) 10/16/2019 1103   PLT 77 (L) 12/20/2019 0220   PLT 162 10/16/2019 1103   MCV 88.6 12/20/2019 0220   MCV 70 (L) 10/16/2019 1103   MCH 29.3 12/20/2019 0220   MCHC 33.0 12/20/2019 0220   RDW 22.7 (H) 12/20/2019 0220   RDW 19.8 (H) 10/16/2019 1103   LYMPHSABS 0.9 10/04/2019 0815   MONOABS 1.3 (H) 10/04/2019 0815   EOSABS 0.2 10/04/2019 0815   BASOSABS 0.1 10/04/2019 0815   CMP     Component Value Date/Time   NA 128 (L) 12/20/2019 0220   NA 128 (L) 11/11/2019 1530   K 3.4 (L) 12/20/2019 0220   CL 99 12/20/2019 0220   CO2 19 (L) 12/20/2019 0220   GLUCOSE 90 12/20/2019 0220   BUN 9 12/20/2019 0220   BUN 8 11/11/2019 1530   CREATININE 0.65 12/20/2019 0220   CALCIUM 7.7 (L) 12/20/2019 0220   PROT 5.5 (L) 12/20/2019 0220   PROT 7.2 10/16/2019 1103   ALBUMIN 2.5 (L) 12/20/2019 0220   ALBUMIN 2.9 (L) 10/16/2019 1103   AST 62 (H) 12/20/2019 0220   ALT 27 12/20/2019 0220   ALKPHOS 56 12/20/2019 0220   BILITOT 16.6 (H) 12/20/2019 0220   BILITOT 4.0 (H) 10/16/2019 1103   GFRNONAA >60 12/20/2019 0220   GFRAA 123 11/11/2019 1530   Assessment:  1.  Hematemesis due to esophageal variceal bleeding, s/p EGD with  banding. 2.  Acute blood loss anemia. 3.  Cirrhosis, decompensated (variceal bleeding, ascites)  Plan:  1.  Paracentesis today. 2.  Adjusting diuretics.  Low sodium diet. 3.  Not candidate for TIPS due to hyperbilirubinemia and advanced cirrhosis. 4.  Not candidate for liver transplant at this time due to very recent alcohol intake. 5.  [facility] will follow; hopefully home in a couple days.   Landry Dyke 12/20/2019, 1:04 PM   Cell [phone] If no answer or after 5 PM call [phone]

## 2019-12-20 NOTE — Plan of Care (Signed)
  Problem: Education: Goal: Knowledge of General Education information will improve Description: Including pain rating scale, medication(s)/side effects and non-pharmacologic comfort measures 12/20/2019 2257 by Sheran Luz, RN Outcome: Progressing 12/20/2019 2256 by Sheran Luz, RN Outcome: Progressing   Problem: Clinical Measurements: Goal: Ability to maintain clinical measurements within normal limits will improve Outcome: Progressing Goal: Will remain free from infection Outcome: Progressing

## 2019-12-20 NOTE — Progress Notes (Signed)
Family Medicine Teaching Service Daily Progress Note Intern Pager: [phone]  Patient name: Joshua Hubbard Medical record number: [id number] Date of birth: [birth year] Age: 44 y.o. Gender: male  Primary Care Provider: [name], MD Consultants: Gastroenterology  Code Status: Full Code   Pt Overview and Major Events to Date:  Hospital Day: 7 11/20 patient admitted to ICU for upper GI bleed 11/21 EGD w/ banding of bleeding varices 11/24 PT/OT consulted: no f/u needed  11/25 patient stable for transfer to floor status, family medicine team assumed care  Assessment and Plan: Joshua Hubbard is a 44 year old male with past medical history of decompensated liver disease, GI bleeding who presented after esophageal variceal bleed.  PMHx s/f GIB, ETOH cirrhosis.  Decompensated Cirrhosis  Ascites Upper GIB, s/p variceal banding  Encephalopathy, improving  VSS. MELD-Na 33 (Tbili 16.6, INR 3.3, Na 128, Cr 0.65).  S/p 6 pRBC, 3 plt, 4 FFP. Hgb stable at 7.7 this morning. Platelets 77. BUN stable. Patient is not a TIPS candidate at thsi time.  . GI consulted, appreciate recs.  o Ascites: Furosemide 20 mg + spironolactone 50 mg, IR paracentesis today  o Encephalopathy: Continue lactulose  o GIB: Vit K per note 11/26, but not ordered . PO Protonix 40 mg BID  o Ceftriaxone ppx  . Holding VTE prophylaxis  . Daily CBC, CMP, PT/INR   ETOH abuse  ETOH 23mg /dL (11/20).  [name] Kitchen CIWA q4  . Continue folic acid + thiamine + MVI daily  . TOC consulted   FEN/GI:  . Fluids: none  . Electrolytes: Replete PRN   . Nutrition: Na restricted diet   Access: Left PIV  VTE prophylaxis: Contraindicated due to bleed, SCDs  Status is: Inpatient Remains inpatient appropriate because:Inpatient level of care appropriate due to severity of illness  Disposition:  Patient From: Home  Planned Disposition: Home  Expected discharge date: 12/25/19  Medically stable for discharge: No  Subjective:  NAEO.    Objective: Temp:  [97.8 F (36.6 C)-100.2 F (37.9 C)] 97.8 F (36.6 C) (11/26 0500) Pulse Rate:  [86-87] 87 (11/25 2049) Cardiac Rhythm: Normal sinus rhythm (11/25 1900) Resp:  [16-19] 16 (11/26 0500) BP: (101-111)/(60-66) 111/66 (11/26 0500) SpO2:  [98 %-100 %] 100 % (11/26 0500) Weight:  [81 kg] 81 kg (11/26 0500) Intake/Output      11/25 0701 - 11/26 0700 11/26 0701 - 11/27 0700   P.O.     I.V. (mL/kg)     IV Piggyback     Total Intake(mL/kg)     Net              Physical Exam: Patient in procedure this AM. Will return later for physical exam. Can also see attending notes.   Laboratory: I have personally read and reviewed all labs and imaging studies.  CBC: Recent Labs  Lab 12/18/19 0142 12/19/19 0016 12/20/19 0220  WBC 8.0 8.1 9.8  HGB 7.6* 7.7* 7.7*  HCT 22.6* 23.1* 23.3*  MCV 86.6 90.2 88.6  PLT 50* 51* 77*   CMP: Recent Labs  Lab 12/15/19 0310 12/15/19 0310 12/16/19 0132 12/17/19 0054 12/18/19 0142 12/18/19 0142 12/18/19 2057 12/19/19 0016 12/20/19 0220  NA 133*   < > 136   < > 133*  --   --  133* 128*  K 4.5   < > 3.7   < > 2.8*   < > 3.7 3.8 3.4*  CL 102   < > 104   < > 100  --   --  102 99  CO2 16*   < > 22   < > 23  --   --  21* 19*  GLUCOSE 166*   < > 127*   < > 142*  --   --  110* 90  BUN 37*   < > 32*   < > 12  --   --  7 9  CREATININE 1.03   < > 0.77   < > 0.69  --   --  0.55* 0.65  CALCIUM 8.1*   < > 8.1*   < > 7.8*  --   --  7.7* 7.7*  MG 1.2*  --  1.7  --  1.7  --   --   --   --   PHOS 5.3*  --  3.4  --   --   --   --   --   --   ALBUMIN 1.7*  --   --    < > 2.6*  --   --  2.5* 2.5*   < > = values in this interval not displayed.   CBG: Recent Labs  Lab 12/19/19 0427 12/19/19 0729 12/19/19 1336 12/19/19 1742 12/19/19 2047  GLUCAP 86 149* 108* 119* 117*   Recent Labs  Lab 12/16/19 1152 12/17/19 0054 12/18/19 0142 12/19/19 0016 12/20/19 0220  INR 2.6* 2.7* 3.1* 2.9* 3.3*    Micro: Covid Negative Blood culture  (12/14/19) NG, final  MRSA negative   Imaging/Diagnostic Tests: No results found.  Procedures:   Procedure Orders     Procedural/ Surgical Case Request: ESOPHAGOGASTRODUODENOSCOPY (EGD) WITH PROPOFOL     UPPER ENDOSCOPY     ED EKG     EKG 12-Lead     EKG  Wilber Oliphant, MD 12/20/2019, 7:47 AM PGY-3, [facility] Intern pager: [phone], text pages welcome

## 2019-12-20 NOTE — Procedures (Signed)
PROCEDURE SUMMARY:  Successful US guided paracentesis from left lateral abdomen.  Yielded 5.0 liters of yellow, clear fluid.  No immediate complications.  Pt tolerated well.   Specimen was sent for labs.  EBL < 29mL  Docia Barrier PA-C 12/20/2019 10:42 AM

## 2019-12-21 ENCOUNTER — Other Ambulatory Visit (HOSPITAL_COMMUNITY): Payer: Self-pay | Admitting: Family Medicine

## 2019-12-21 DIAGNOSIS — R531 Weakness: Secondary | ICD-10-CM | POA: Diagnosis not present

## 2019-12-21 DIAGNOSIS — I8511 Secondary esophageal varices with bleeding: Secondary | ICD-10-CM | POA: Diagnosis not present

## 2019-12-21 DIAGNOSIS — K7031 Alcoholic cirrhosis of liver with ascites: Secondary | ICD-10-CM | POA: Diagnosis not present

## 2019-12-21 LAB — CBC
HCT: 24.9 % — ABNORMAL LOW (ref 39.0–52.0)
Hemoglobin: 8.3 g/dL — ABNORMAL LOW (ref 13.0–17.0)
MCH: 29.3 pg (ref 26.0–34.0)
MCHC: 33.3 g/dL (ref 30.0–36.0)
MCV: 88 fL (ref 80.0–100.0)
Platelets: 104 10*3/uL — ABNORMAL LOW (ref 150–400)
RBC: 2.83 MIL/uL — ABNORMAL LOW (ref 4.22–5.81)
RDW: 22.2 % — ABNORMAL HIGH (ref 11.5–15.5)
WBC: 12.4 10*3/uL — ABNORMAL HIGH (ref 4.0–10.5)
nRBC: 0 % (ref 0.0–0.2)

## 2019-12-21 LAB — GLUCOSE, CAPILLARY
Glucose-Capillary: 93 mg/dL (ref 70–99)
Glucose-Capillary: 102 mg/dL — ABNORMAL HIGH (ref 70–99)

## 2019-12-21 LAB — COMPREHENSIVE METABOLIC PANEL
ALT: 28 U/L (ref 0–44)
AST: 58 U/L — ABNORMAL HIGH (ref 15–41)
Albumin: 2.4 g/dL — ABNORMAL LOW (ref 3.5–5.0)
Alkaline Phosphatase: 74 U/L (ref 38–126)
Anion gap: 9 (ref 5–15)
BUN: 9 mg/dL (ref 6–20)
CO2: 20 mmol/L — ABNORMAL LOW (ref 22–32)
Calcium: 7.8 mg/dL — ABNORMAL LOW (ref 8.9–10.3)
Chloride: 98 mmol/L (ref 98–111)
Creatinine, Ser: 0.64 mg/dL (ref 0.61–1.24)
GFR, Estimated: >60 mL/min (ref >60)
Glucose, Bld: 118 mg/dL — ABNORMAL HIGH (ref 70–99)
Potassium: 3.4 mmol/L — ABNORMAL LOW (ref 3.5–5.1)
Sodium: 127 mmol/L — ABNORMAL LOW (ref 135–145)
Total Bilirubin: 17.5 mg/dL — ABNORMAL HIGH (ref 0.3–1.2)
Total Protein: 5.7 g/dL — ABNORMAL LOW (ref 6.5–8.1)

## 2019-12-21 MED ORDER — THIAMINE HCL 100 MG PO TABS
100.0000 mg | ORAL_TABLET | Freq: Every day | ORAL | 0 refills | Status: DC
Start: 2019-12-22 — End: 2020-08-28

## 2019-12-21 MED ORDER — LORAZEPAM 1 MG PO TABS
1.0000 mg | ORAL_TABLET | Freq: Once | ORAL | Status: AC
  Administered 2019-12-21: 1 mg via ORAL
  Filled 2019-12-21: qty 1

## 2019-12-21 MED ORDER — PANTOPRAZOLE SODIUM 40 MG PO TBEC
40.0000 mg | DELAYED_RELEASE_TABLET | Freq: Two times a day (BID) | ORAL | 0 refills | Status: DC
Start: 2019-12-21 — End: 2019-12-27

## 2019-12-21 MED ORDER — LACTULOSE 10 GM/15ML PO SOLN
20.0000 g | Freq: Three times a day (TID) | ORAL | 0 refills | Status: DC
Start: 2019-12-21 — End: 2020-01-08

## 2019-12-21 MED ORDER — PROPRANOLOL HCL 10 MG PO TABS
10.0000 mg | ORAL_TABLET | Freq: Two times a day (BID) | ORAL | Status: DC
  Administered 2019-12-21: 10 mg via ORAL
  Filled 2019-12-21 (×2): qty 1

## 2019-12-21 MED ORDER — FUROSEMIDE 20 MG PO TABS
20.0000 mg | ORAL_TABLET | Freq: Every day | ORAL | 0 refills | Status: DC
Start: 2019-12-22 — End: 2020-01-08

## 2019-12-21 MED ORDER — CEFDINIR 300 MG PO CAPS
300.0000 mg | ORAL_CAPSULE | Freq: Once | ORAL | Status: DC
Start: 2019-12-21 — End: 2019-12-21

## 2019-12-21 MED ORDER — FOLIC ACID 1 MG PO TABS
1.0000 mg | ORAL_TABLET | Freq: Every day | ORAL | 0 refills | Status: DC
Start: 2019-12-22 — End: 2020-01-08

## 2019-12-21 MED ORDER — CEFDINIR 300 MG PO CAPS
600.0000 mg | ORAL_CAPSULE | Freq: Once | ORAL | Status: AC
  Administered 2019-12-21: 600 mg via ORAL
  Filled 2019-12-21: qty 2

## 2019-12-21 MED ORDER — SPIRONOLACTONE 50 MG PO TABS
50.0000 mg | ORAL_TABLET | Freq: Every day | ORAL | 0 refills | Status: DC
Start: 2019-12-22 — End: 2020-01-08

## 2019-12-21 MED ORDER — CEFDINIR 300 MG PO CAPS: 600.0000 mg | ORAL_CAPSULE | Freq: Every day | ORAL | 0 refills | Status: AC

## 2019-12-21 MED ORDER — PROPRANOLOL HCL 10 MG PO TABS
10.0000 mg | ORAL_TABLET | Freq: Two times a day (BID) | ORAL | 0 refills | Status: DC
Start: 2019-12-21 — End: 2019-12-23

## 2019-12-21 MED ORDER — OXYCODONE HCL 5 MG PO TABS
5.0000 mg | ORAL_TABLET | Freq: Once | ORAL | Status: AC
  Administered 2019-12-21: 5 mg via ORAL
  Filled 2019-12-21: qty 1

## 2019-12-21 MED ORDER — HYDROXYZINE HCL 50 MG PO TABS
50.0000 mg | ORAL_TABLET | Freq: Three times a day (TID) | ORAL | 0 refills | Status: DC | PRN
Start: 2019-12-21 — End: 2020-02-21

## 2019-12-21 NOTE — Discharge Instructions (Addendum)
Dear Joshua Hubbard,   Thank you for letting us participate in your care! In this section, you will find a brief hospital admission summary of why you were admitted to the hospital, what happened during your admission, your diagnosis/diagnoses, and recommended follow up.   You were admitted because you were experiencing confusion, diarrhea and vomiting. He required stay in the ICU because of your extensive bleeding and confusion.  Your testing revealed esophageal bleeding which is a complication from your liver disease. GI saw you and took you for banding to stop the bleeding.   For your confusion, you were treated with a medication called lactulose. Please continue to take this 3 times a day. This medication does cause you to have more bowel movements.  To prevent any infection from the bleeding, you have been getting IV ceftriaxone. We have switched this medication over to an oral form. Please continue to take cefdinir 600 mg once daily at 5pm on 11/28-11/29/21.  Please see below for other medication changes/additions.    Please call and make a follow-up appointment with [facility] Hubbard gastroenterology in the next 3 to 4 weeks for your hospital follow-up.  I have ordered an outpatient paracentesis for abdominal fluid draining.   POST-HOSPITAL & CARE INSTRUCTIONS 1. Please let PCP/Specialists know of any changes in medications that were made.  2. Please see medications section of this packet for any medication changes.   DOCTOR'S APPOINTMENTS & FOLLOW UP Future Appointments  Date Time Provider [place]  12/25/2019 11:10 AM [name], MD [facility] [place]    Thank you for choosing [facility]! Take care and be well!  [facility]  [place], [place] [address] [phone]

## 2019-12-21 NOTE — Progress Notes (Signed)
Subjective: Abdomen less distended. No further bleeding.  Objective: Vital signs in last 24 hours: Temp:  [99 F (37.2 C)-99.5 F (37.5 C)] 99.4 F (37.4 C) (11/27 0558) Pulse Rate:  [99-122] 122 (11/27 0558) Resp:  [18-20] 20 (11/27 0558) BP: (109-124)/(65-80) 109/65 (11/27 0558) SpO2:  [94 %-95 %] 95 % (11/27 0558) Weight change:  Last BM Date: 12/19/19  PE: GEN:  NAD HEENT:  Jaundiced NEURO:  No encephalopathy ABD:  Distended (improved)  Lab Results:  Ascites fluid WBC < 250, no SBP  CBC    Component Value Date/Time   WBC 12.4 (H) 12/21/2019 0352   RBC 2.83 (L) 12/21/2019 0352   HGB 8.3 (L) 12/21/2019 0352   HGB 8.8 (L) 10/16/2019 1103   HCT 24.9 (L) 12/21/2019 0352   HCT 27.7 (L) 10/16/2019 1103   PLT 104 (L) 12/21/2019 0352   PLT 162 10/16/2019 1103   MCV 88.0 12/21/2019 0352   MCV 70 (L) 10/16/2019 1103   MCH 29.3 12/21/2019 0352   MCHC 33.3 12/21/2019 0352   RDW 22.2 (H) 12/21/2019 0352   RDW 19.8 (H) 10/16/2019 1103   LYMPHSABS 0.9 10/04/2019 0815   MONOABS 1.3 (H) 10/04/2019 0815   EOSABS 0.2 10/04/2019 0815   BASOSABS 0.1 10/04/2019 0815   CMP     Component Value Date/Time   NA 127 (L) 12/21/2019 0352   NA 128 (L) 11/11/2019 1530   K 3.4 (L) 12/21/2019 0352   CL 98 12/21/2019 0352   CO2 20 (L) 12/21/2019 0352   GLUCOSE 118 (H) 12/21/2019 0352   BUN 9 12/21/2019 0352   BUN 8 11/11/2019 1530   CREATININE 0.64 12/21/2019 0352   CALCIUM 7.8 (L) 12/21/2019 0352   PROT 5.7 (L) 12/21/2019 0352   PROT 7.2 10/16/2019 1103   ALBUMIN 2.4 (L) 12/21/2019 0352   ALBUMIN 2.9 (L) 10/16/2019 1103   AST 58 (H) 12/21/2019 0352   ALT 28 12/21/2019 0352   ALKPHOS 74 12/21/2019 0352   BILITOT 17.5 (H) 12/21/2019 0352   BILITOT 4.0 (H) 10/16/2019 1103   GFRNONAA >60 12/21/2019 0352   GFRAA 123 11/11/2019 1530   Assessment:  1.  Hematemesis due to esophageal variceal bleeding, s/p EGD with banding. 2.  Acute blood loss anemia. 3.  Cirrhosis,  decompensated (variceal bleeding, ascites)  Plan:  1.  Ok to discharge home from GI perspective. 2.  Discharge medications from GI perspective:  Pantoprazole 40 mg po bid, lactulose 20 grams po TID, spironolactone 50 mg po qd, furosemide 20 mg po qd, propranolol 10 mg po bid.  Patient will need ceftin 500 mg po bid (or equivalent) to complete total 10 day course (including when IV ceftriaxone was first started) given patient's GI bleeding with ascites. 3.  Needs repeat CMP next week with PCP as outpatient to monitor sodium and other electrolytes, as well as liver function tests. 4.  Patient reports being followed by [facility] hepatology; please arrange outpatient follow-up appt with them for patient within the next 2-3 weeks. 5.  No further GI plans as inpatient. 6.  [facility] will sign-off; please call with questions; thank you for the consultation.  Landry Dyke 12/21/2019, 10:29 AM   Cell [phone] If no answer or after 5 PM call [phone]

## 2019-12-21 NOTE — Discharge Summary (Signed)
[facility] Discharge Summary  Patient name: Joshua Hubbard Medical record number: [id number] Date of birth: [birth year] Age: 44 y.o. Gender: male Date of Admission: 12/14/2019  Date of Discharge: 12/21/19    Admitting Physician: Chesley Mires, MD  Primary Care Provider: [name], MD Consultants: Gastroenterology   Indication for Hospitalization: Esophageal Varices    Discharge Diagnoses/Problem List:  Active Problems:   Syncope   Decompensated liver disease ([place])   GI bleeding   Weakness   Secondary esophageal varices with bleeding ([place])   Ascites due to alcoholic cirrhosis ([place])  Disposition: Discharge to home  Discharge Condition: Stable  Discharge Exam:  See Daily progress note for discharge exam   Brief Hospital Course:  Joshua Hubbard is a 44 y.o. male with past medical history significant for alcoholic cirrhosis, esophageal varices (status post banding 09/2018), decompensated liver failure who presented to [facility] emergency department after 24 hours of nausea, vomiting, diarrhea. On initial admission, he required transfer to ICU for hemorrhagic shock secondary to significant GI bleeding from esophageal varices.  Gastroenterology was consulted and patient underwent banding on 12/15/2019.  Patient was stable for floor status and transferred back to family medicine teaching service on 12/19/2019. For his decompensated liver cirrhosis, patient was treated with ceftriaxone for SBP prophylaxis in the setting of GI bleeds, octreotide drip and then transition to Protonix, lactulose for encephalopathy, and required 6 units packed red blood cells, 3 units of platelets, 4 units FFP.  Patient also had therapeutic paracentesis on 12/20/2019 yielding 5 L of clear yellow fluid with negative labs. Patient's hemoglobin was monitored through his admission and was stable by day of discharge.  His BUN was also stable and had no signs of GI bleeding.   His encephalopathy also continue to improve with lactulose.  During his admission, patient was noted to have episodes of anxiety.  He was started on hydroxyzine 3 times daily.  He did require 1 mg of Ativan for severe anxiety just prior to discharge.  At discharge, his medications included spironolactone 50 mg, Lasix 20 mg, p.o. Protonix, cefdinir (last day 11/29 for total of 10 days of antibiotic prophylaxis), lactulose.  It was recommended that he follow-up with his [facility] gastroenterologist within 3 to 4 weeks of hospital discharge.  The family was working on possible admission to inpatient rehab for alcohol use disorder.  On day of discharge, patient had a tight abdomen and requested paracentesis, which unfortunately could not be performed prior to discharge. Ordered paracentesis with IR at [facility] with discharge orders. Patient may benefit from scheduled paracentesis   Issues for Follow Up:  1. Recheck BMP, CBC, LFTs 2. Follow-up anxiety, depression, substance use and possible plans for rehab 3. T bili continuing to rise at discharge which was concerning as patient had MRI evaluation in September showing possible cholangiocarcinoma (but was deemed less likely per read). GI did not think that this was an urgent matter and that the T bili was rising in a typical pattern for ESLD. They recommended continuing to follow up with [facility] and repeating the MRI as originally planned.    Significant Procedures:  EGD (11/21), paracentesis (11/26)  Significant Labs and Imaging:  Recent Labs  Lab 12/19/19 0016 12/20/19 0220 12/21/19 0352  WBC 8.1 9.8 12.4*  HGB 7.7* 7.7* 8.3*  HCT 23.1* 23.3* 24.9*  PLT 51* 77* 104*   Recent Labs  Lab 12/15/19 0310 12/15/19 0310 12/16/19 0132 12/17/19 0054 12/17/19 0054  12/18/19 0142 12/18/19 2057 12/19/19 0016 12/19/19 0016 12/20/19 0220 12/21/19 0352  NA 133*   < > 136 138  --  133*  --  133*  --  128* 127*  K 4.5   < > 3.7 3.2*   < > 2.8*    < > 3.8   < > 3.4* 3.4*  CL 102   < > 104 103  --  100  --  102  --  99 98  CO2 16*   < > 22 23  --  23  --  21*  --  19* 20*  GLUCOSE 166*   < > 127* 116*  --  142*  --  110*  --  90 118*  BUN 37*   < > 32* 22*  --  12  --  7  --  9 9  CREATININE 1.03   < > 0.77 0.74  --  0.69  --  0.55*  --  0.65 0.64  CALCIUM 8.1*   < > 8.1* 8.0*  --  7.8*  --  7.7*  --  7.7* 7.8*  MG 1.2*  --  1.7  --   --  1.7  --   --   --   --   --   PHOS 5.3*  --  3.4  --   --   --   --   --   --   --   --   ALKPHOS 51   < >  --  45  --  41  --  43  --  56 74  AST 95*   < >  --  56*  --  45*  --  62*  --  62* 58*  ALT 47*   < >  --  30  --  21  --  27  --  27 28  ALBUMIN 1.7*   < >  --  2.5*  --  2.6*  --  2.5*  --  2.5* 2.4*   < > = values in this interval not displayed.    DG Chest 2 View  Result Date: 12/14/2019 CLINICAL DATA:  Abdominal pain with vomiting EXAM: CHEST - 2 VIEW COMPARISON:  02/10/2019 FINDINGS: The heart size and mediastinal contours are within normal limits. Both lungs are clear. The visualized skeletal structures are unremarkable. IMPRESSION: No active cardiopulmonary disease. Electronically Signed   By: Jerilynn Mages.  Shick M.D.   On: 12/14/2019 15:05   CT HEAD WO CONTRAST  Result Date: 12/16/2019 CLINICAL DATA:  Altered mental status after fall. EXAM: CT HEAD WITHOUT CONTRAST TECHNIQUE: Contiguous axial images were obtained from the base of the skull through the vertex without intravenous contrast. COMPARISON:  February 10, 2019. FINDINGS: Brain: No evidence of acute infarction, hemorrhage, hydrocephalus, extra-axial collection or mass lesion/mass effect. Vascular: No hyperdense vessel or unexpected calcification. Skull: Normal. Negative for fracture or focal lesion. Sinuses/Orbits: No acute finding. Other: None. IMPRESSION: Normal head CT. Electronically Signed   By: Marijo Conception M.D.   On: 12/16/2019 10:00   CT Abdomen Pelvis W Contrast  Result Date: 12/14/2019 CLINICAL DATA:  Abdominal  distension EXAM: CT ABDOMEN AND PELVIS WITH CONTRAST TECHNIQUE: Multidetector CT imaging of the abdomen and pelvis was performed using the standard protocol following bolus administration of intravenous contrast. CONTRAST:  155mL OMNIPAQUE IOHEXOL 300 MG/ML  SOLN COMPARISON:  10/04/2019 FINDINGS: Examination is generally limited by breath motion artifact. Lower chest: No acute abnormality. Large lower esophageal  varices (series 2, image 17). Hepatobiliary: Cirrhotic morphology of the liver. Distended gallbladder containing multiple small gallstones. No biliary ductal dilatation. Pancreas: Unremarkable. No pancreatic ductal dilatation or surrounding inflammatory changes. Spleen: Splenomegaly, maximum coronal span 15.9 cm. Adrenals/Urinary Tract: Adrenal glands are unremarkable. Kidneys are normal, without renal calculi, solid lesion, or hydronephrosis. Bladder is unremarkable. Stomach/Bowel: Stomach is within normal limits. Appendix appears normal. No evidence of bowel wall thickening, distention, or inflammatory changes. Vascular/Lymphatic: Aortic atherosclerosis. Varices about the ventral abdomen. No enlarged abdominal or pelvic lymph nodes. Reproductive: No mass or other significant abnormality. Other: No abdominal wall hernia or abnormality. Moderate volume ascites throughout the abdomen and pelvis, increased compared to prior examination. Musculoskeletal: No acute or significant osseous findings. IMPRESSION: 1. Examination is generally limited by breath motion artifact. 2. Stigmata of cirrhosis and portal hypertension, including splenomegaly and moderate volume ascites, increased compared to prior examination. 3. Distended gallbladder containing multiple small gallstones. No biliary ductal dilatation. Aortic Atherosclerosis (ICD10-I70.0). Electronically Signed   By: Eddie Candle M.D.   On: 12/14/2019 15:11   IR Paracentesis  Result Date: 12/20/2019 INDICATION: Patient with history of alcoholic cirrhosis,  recurrent ascites. Request is made for diagnostic and therapeutic paracentesis. EXAM: ULTRASOUND GUIDED DIAGNOSTIC AND THERAPEUTIC PARACENTESIS MEDICATIONS: 10 mL 1% lidocaine COMPLICATIONS: None immediate. PROCEDURE: Informed written consent was obtained from the patient after a discussion of the risks, benefits and alternatives to treatment. A timeout was performed prior to the initiation of the procedure. Initial ultrasound scanning demonstrates a large amount of ascites within the left lateral abdomen. The left lateral abdomen was prepped and draped in the usual sterile fashion. 1% lidocaine was used for local anesthesia. Following this, a 19 gauge, 7-cm, Yueh catheter was introduced. An ultrasound image was saved for documentation purposes. The paracentesis was performed. The catheter was removed and a dressing was applied. The patient tolerated the procedure well without immediate post procedural complication. FINDINGS: A total of approximately 5.0 liters of yellow fluid was removed. Samples were sent to the laboratory as requested by the clinical team. IMPRESSION: Successful ultrasound-guided diagnostic and therapeutic paracentesis yielding 5.0 liters of peritoneal fluid. Read by: Brynda Greathouse PA-C Electronically Signed   By: Ruthann Cancer MD   On: 12/20/2019 14:18    Results/Tests Pending at Time of Discharge:  . None   Discharge Medications:  Allergies as of 12/21/2019   No Known Allergies     Medication List    STOP taking these medications   clonazePAM 1 MG tablet Commonly known as: KLONOPIN   oxyCODONE 20 mg 12 hr tablet Commonly known as: OXYCONTIN     TAKE these medications   acamprosate 333 MG tablet Commonly known as: CAMPRAL Take 2 tablets (666 mg total) by mouth 3 (three) times daily.   buPROPion 150 MG 24 hr tablet Commonly known as: WELLBUTRIN XL TAKE 1 TABLET BY MOUTH EVERY DAY   cefdinir 300 MG capsule Commonly known as: OMNICEF Take 2 capsules (600 mg total)  by mouth daily for 1 day. Start taking on: December 22, 7207   folic acid 1 MG tablet Commonly known as: FOLVITE Take 1 tablet (1 mg total) by mouth daily. Start taking on: December 22, 2019   furosemide 20 MG tablet Commonly known as: LASIX Take 1 tablet (20 mg total) by mouth daily. Start taking on: December 22, 2019 What changed:   how much to take  how to take this  when to take this   hydrOXYzine 50 MG tablet Commonly  known as: ATARAX/VISTARIL Take 1 tablet (50 mg total) by mouth 3 (three) times daily as needed for anxiety.   lactulose 10 GM/15ML solution Commonly known as: CHRONULAC Take 30 mLs (20 g total) by mouth 3 (three) times daily. What changed: when to take this   multivitamin with minerals Tabs tablet Take 1 tablet by mouth daily.   pantoprazole 40 MG tablet Commonly known as: PROTONIX Take 1 tablet (40 mg total) by mouth 2 (two) times daily. What changed: when to take this   propranolol 10 MG tablet Commonly known as: INDERAL Take 1 tablet (10 mg total) by mouth 2 (two) times daily. What changed:   medication strength  how much to take   spironolactone 50 MG tablet Commonly known as: ALDACTONE Take 1 tablet (50 mg total) by mouth daily. Start taking on: December 22, 2019 What changed:   how much to take  how to take this  when to take this  additional instructions   thiamine 100 MG tablet Take 1 tablet (100 mg total) by mouth daily. Start taking on: December 22, 2019       Discharge Instructions: Please refer to Patient Instructions section of EMR for full details.  Patient was counseled important signs and symptoms that should prompt return to medical care, changes in medications, dietary instructions, activity restrictions, and follow up appointments.   Follow-Up Appointments: Future Appointments  Date Time Provider [place]  12/25/2019 11:10 AM [name], MD [facility] [place]     Wilber Oliphant, MD 12/21/2019, 4:03  PM PGY-3, [place]

## 2019-12-21 NOTE — Plan of Care (Signed)
  Problem: Clinical Measurements: Goal: Ability to maintain clinical measurements within normal limits will improve Outcome: Adequate for Discharge Goal: Diagnostic test results will improve Outcome: Adequate for Discharge   Problem: Coping: Goal: Level of anxiety will decrease Outcome: Adequate for Discharge   Problem: Education: Goal: Knowledge of General Education information will improve Description: Including pain rating scale, medication(s)/side effects and non-pharmacologic comfort measures Outcome: Completed/Met   Problem: Health Behavior/Discharge Planning: Goal: Ability to manage health-related needs will improve Outcome: Completed/Met   Problem: Clinical Measurements: Goal: Will remain free from infection Outcome: Completed/Met Goal: Respiratory complications will improve Outcome: Completed/Met Goal: Cardiovascular complication will be avoided Outcome: Completed/Met   Problem: Activity: Goal: Risk for activity intolerance will decrease Outcome: Completed/Met   Problem: Nutrition: Goal: Adequate nutrition will be maintained Outcome: Completed/Met   Problem: Elimination: Goal: Will not experience complications related to bowel motility Outcome: Completed/Met Goal: Will not experience complications related to urinary retention Outcome: Completed/Met   Problem: Pain Managment: Goal: General experience of comfort will improve Outcome: Completed/Met   Problem: Safety: Goal: Ability to remain free from injury will improve Outcome: Completed/Met   Problem: Skin Integrity: Goal: Risk for impaired skin integrity will decrease Outcome: Completed/Met   Problem: Education: Goal: Knowledge of disease or condition will improve Outcome: Completed/Met Goal: Understanding of discharge needs will improve Outcome: Completed/Met   Problem: Health Behavior/Discharge Planning: Goal: Ability to identify changes in lifestyle to reduce recurrence of condition will  improve Outcome: Completed/Met Goal: Identification of resources available to assist in meeting health care needs will improve Outcome: Completed/Met   Problem: Safety: Goal: Ability to remain free from injury will improve Outcome: Completed/Met   Problem: Education: Goal: Ability to identify signs and symptoms of gastrointestinal bleeding will improve Outcome: Completed/Met   Problem: Bowel/Gastric: Goal: Will show no signs and symptoms of gastrointestinal bleeding Outcome: Completed/Met   Problem: Fluid Volume: Goal: Will show no signs and symptoms of excessive bleeding Outcome: Completed/Met   Problem: Clinical Measurements: Goal: Complications related to the disease process, condition or treatment will be avoided or minimized Outcome: Completed/Met   Problem: Education: Goal: Ability to identify signs and symptoms of gastrointestinal bleeding will improve Outcome: Completed/Met   Problem: Bowel/Gastric: Goal: Will show no signs and symptoms of gastrointestinal bleeding Outcome: Completed/Met   Problem: Fluid Volume: Goal: Will show no signs and symptoms of excessive bleeding Outcome: Completed/Met   Problem: Clinical Measurements: Goal: Complications related to the disease process, condition or treatment will be avoided or minimized Outcome: Completed/Met   Problem: Physical Regulation: Goal: Complications related to the disease process, condition or treatment will be avoided or minimized Outcome: Not Applicable

## 2019-12-21 NOTE — Progress Notes (Signed)
Family Medicine Teaching Service Daily Progress Note Intern Pager: [phone]  Patient name: Joshua Hubbard Medical record number: [id number] Date of birth: Nov 18, 1975 Age: 44 y.o. Gender: male  Primary Care Provider: [name], MD Consultants: Gastroenterology  Code Status: Full Code   Pt Overview and Major Events to Date:  Hospital Day: 8 11/20 patient admitted to ICU for upper GI bleed 11/21 EGD w/ banding of bleeding varices 11/24 PT/OT consulted: no f/u needed  11/25 patient stable for transfer to floor status, family medicine team assumed care  Assessment and Plan: Joshua Hubbard is a 44 year old male with past medical history of decompensated liver disease, GI bleeding who presented after esophageal variceal bleed.  PMHx s/f GIB, ETOH cirrhosis.  Decompensated Cirrhosis  Ascites Upper GIB, s/p variceal banding  Encephalopathy, improving  99.34F, HR 122, 109/65. 95% on room air  MELD-Na 34 (Cr0.64, Na 127, TBili 17.5, INR 3.3). T bili continues to rise. On chart review, patient have MRI a few months ago with possible c/f cholangiocarcinoma. Given rising t bili, will ask GI for recs prior to DC.  S/p 6 pRBC, 3 plt, 4 FFP. Hgb stable at 8.3 this morning. Platelets 104. BUN stable. Patient is not a TIPS candidate at this time.  . GI consulted, appreciate recs.  o Ascites: Furosemide 20 mg + spironolactone 50 mg o Encephalopathy: Continue lactulose  o GIB: Vit K per note 11/26, but not ordered . PO Protonix 40 mg BID  o Ceftriaxone IV ppx x 10 days >> PO ceftin today  o Okay to DC from GI POV with f/u at [facility].   [name] Kitchen Holding VTE prophylaxis  . Daily CBC, CMP, PT/INR   Anxiety  Hydroxyzine PRN + ativan 1 mg PO 0100 last night. Oxycodone 20 mg #60 last filled 11/19/19. Pt does report that he has been using this up until hospitalization. Unlikely due to acute withdrawal given day 7 of hospitalization.  . Continue to monitor . Will need f/u outpatient  ETOH abuse  ETOH 23mg /dL  (11/20).  [name] Kitchen CIWA q4  . Continue folic acid + thiamine + MVI daily  . TOC consulted   FEN/GI:  . Fluids: none  . Electrolytes: Replete PRN   . Nutrition: Na restricted diet   Access: Left PIV  VTE prophylaxis: Contraindicated due to bleed, SCDs  Status is: Inpatient Remains inpatient appropriate because:Inpatient level of care appropriate due to severity of illness  Disposition:  Patient From: Home  Planned Disposition: Home  Expected discharge date: 12/25/19  Medically stable for discharge: No  Subjective:  NAEO.   Objective: Temp:  [99 F (37.2 C)-99.5 F (37.5 C)] 99.4 F (37.4 C) (11/27 0558) Pulse Rate:  [97-122] 122 (11/27 0558) Cardiac Rhythm: Atrial fibrillation (11/27 0803) Resp:  [18-20] 20 (11/27 0558) BP: (109-124)/(65-80) 109/65 (11/27 0558) SpO2:  [94 %-95 %] 95 % (11/27 0558) Intake/Output      11/26 0701 - 11/27 0700 11/27 0701 - 11/28 0700   P.O. 480    I.V. (mL/kg) 0 (0)    IV Piggyback 100    Total Intake(mL/kg) 580 (7.2)    Other 5000    Total Output 5000    Net -4420         Urine Occurrence 3 x    Stool Occurrence 2 x        Physical Exam: General: NAD, non-toxic, well-appearing, sitting comfortably in bed   HEENT: [place]/AT. PERRLA. EOMI. Scleral icterus Cardiovascular: RRR, normal S1, S2. B/L 2+  RP. No BLEE Respiratory: CTAB. No IWOB.  Abdomen: + BS. NT, distended with mildly bulging flanks. soft to palpation.  Extremities: Warm and well perfused. Moving spontaneously.  Integumentary: No obvious rashes, lesions, trauma on general exam. jaundice  Neuro: A & O x4. CN grossly intact. No FND  Laboratory: I have personally read and reviewed all labs and imaging studies.  CBC: Recent Labs  Lab 12/19/19 0016 12/20/19 0220 12/21/19 0352  WBC 8.1 9.8 12.4*  HGB 7.7* 7.7* 8.3*  HCT 23.1* 23.3* 24.9*  MCV 90.2 88.6 88.0  PLT 51* 77* 104*   CMP: Recent Labs  Lab 12/15/19 0310 12/15/19 0310 12/16/19 0132 12/17/19 0054 12/18/19 0142  12/18/19 2057 12/19/19 0016 12/20/19 0220 12/21/19 0352  NA 133*   < > 136   < > 133*  --  133* 128* 127*  K 4.5   < > 3.7   < > 2.8*   < > 3.8 3.4* 3.4*  CL 102   < > 104   < > 100  --  102 99 98  CO2 16*   < > 22   < > 23  --  21* 19* 20*  GLUCOSE 166*   < > 127*   < > 142*  --  110* 90 118*  BUN 37*   < > 32*   < > 12  --  7 9 9   CREATININE 1.03   < > 0.77   < > 0.69  --  0.55* 0.65 0.64  CALCIUM 8.1*   < > 8.1*   < > 7.8*  --  7.7* 7.7* 7.8*  MG 1.2*  --  1.7  --  1.7  --   --   --   --   PHOS 5.3*  --  3.4  --   --   --   --   --   --   ALBUMIN 1.7*  --   --    < > 2.6*  --  2.5* 2.5* 2.4*   < > = values in this interval not displayed.   CBG: Recent Labs  Lab 12/20/19 0852 12/20/19 1144 12/20/19 1716 12/20/19 2112 12/21/19 0736  GLUCAP 110* 101* 155* 129* 93   Recent Labs  Lab 12/16/19 1152 12/17/19 0054 12/18/19 0142 12/19/19 0016 12/20/19 0220  INR 2.6* 2.7* 3.1* 2.9* 3.3*    Micro: Covid Negative Blood culture (12/14/19) NG, final  MRSA negative   Imaging/Diagnostic Tests: IR Paracentesis  Result Date: 12/20/2019 INDICATION: Patient with history of alcoholic cirrhosis, recurrent ascites. Request is made for diagnostic and therapeutic paracentesis. EXAM: ULTRASOUND GUIDED DIAGNOSTIC AND THERAPEUTIC PARACENTESIS MEDICATIONS: 10 mL 1% lidocaine COMPLICATIONS: None immediate. PROCEDURE: Informed written consent was obtained from the patient after a discussion of the risks, benefits and alternatives to treatment. A timeout was performed prior to the initiation of the procedure. Initial ultrasound scanning demonstrates a large amount of ascites within the left lateral abdomen. The left lateral abdomen was prepped and draped in the usual sterile fashion. 1% lidocaine was used for local anesthesia. Following this, a 19 gauge, 7-cm, Yueh catheter was introduced. An ultrasound image was saved for documentation purposes. The paracentesis was performed. The catheter was  removed and a dressing was applied. The patient tolerated the procedure well without immediate post procedural complication. FINDINGS: A total of approximately 5.0 liters of yellow fluid was removed. Samples were sent to the laboratory as requested by the clinical team. IMPRESSION: Successful ultrasound-guided diagnostic and therapeutic paracentesis  yielding 5.0 liters of peritoneal fluid. Read by: Brynda Greathouse PA-C Electronically Signed   By: Ruthann Cancer MD   On: 12/20/2019 14:18    Procedures:   Procedure Orders     Procedural/ Surgical Case Request: ESOPHAGOGASTRODUODENOSCOPY (EGD) WITH PROPOFOL     UPPER ENDOSCOPY     ED EKG     EKG 12-Lead     EKG  Wilber Oliphant, MD 12/21/2019, 8:55 AM PGY-3, [place] Intern pager: [phone], text pages welcome

## 2019-12-23 ENCOUNTER — Other Ambulatory Visit: Payer: Self-pay | Admitting: Family Medicine

## 2019-12-23 ENCOUNTER — Telehealth: Payer: Self-pay | Admitting: *Deleted

## 2019-12-23 ENCOUNTER — Other Ambulatory Visit: Payer: Self-pay | Admitting: *Deleted

## 2019-12-23 DIAGNOSIS — K7031 Alcoholic cirrhosis of liver with ascites: Secondary | ICD-10-CM

## 2019-12-23 LAB — CYTOLOGY - NON PAP

## 2019-12-23 MED ORDER — PROPRANOLOL HCL 10 MG PO TABS
10.0000 mg | ORAL_TABLET | Freq: Two times a day (BID) | ORAL | 0 refills | Status: DC
Start: 2019-12-23 — End: 2020-01-08

## 2019-12-23 MED FILL — GENERLAC SOLUTION 10G/15ML: 10 | 4 days supply | Qty: 236 | Fill #0

## 2019-12-23 MED FILL — PANTOPRAZOLE SOD DR 40 MG T: 40 | 30 days supply | Qty: 60 | Fill #0

## 2019-12-23 MED FILL — PROPRANOLOL HCL 10 MG TAB: 10 | 30 days supply | Qty: 60 | Fill #0

## 2019-12-23 NOTE — Patient Outreach (Signed)
[facility]) Care Management  12/23/2019  Joshua Hubbard [birth year] [id number]   Transition of care telephone call  Referral received:12/16/19 Initial outreach:12/23/19 Insurance: [place]  Initial unsuccessful telephone call to patient's preferred number in order to complete transition of care assessment; no answer, left HIPAA compliant voicemail message requesting return call.   Objective: Per the electronic medical record, [name]  was hospitalized at [facility] 11/20-11/27 for Syncope, Decompensated liver disease, secondary esophageal varices  with bleeding, Ascites due to alcoholic cirrhosis. Comorbidities   Include:  Alcoholic cirrhosis, esophageal varices, decompensated liver failure, followed at [facility] ,  Alcohol abuse.  He  was discharged to home on 12/21/19  without the need for home health services or durable medical equipment per the discharge summary.   Plan: This RNCM will route unsuccessful outreach letter with [place] Management pamphlet and 24 hour Nurse Advice Line Magnet to [place] Management clinical pool to be mailed to patient's home address. This RNCM will attempt another outreach within 4 business days.  Joylene Draft, RN, BSN  [place] Management Coordinator  [phone]- Mobile [phone]- Toll Free Main Office

## 2019-12-23 NOTE — Telephone Encounter (Signed)
Fax is requesting a 90 day supply for pts Pantoprazole refill ([facility]).Joshua Hubbard, CMA

## 2019-12-25 ENCOUNTER — Ambulatory Visit (INDEPENDENT_AMBULATORY_CARE_PROVIDER_SITE_OTHER): Payer: 59 | Admitting: Family Medicine

## 2019-12-25 ENCOUNTER — Ambulatory Visit (HOSPITAL_COMMUNITY)
Admission: RE | Admit: 2019-12-25 | Discharge: 2019-12-25 | Disposition: A | Discharge status: Home / Self Care | Payer: 59 | Source: Ambulatory Visit | Attending: Family Medicine | Admitting: Family Medicine

## 2019-12-25 ENCOUNTER — Ambulatory Visit (HOSPITAL_COMMUNITY): Payer: 59

## 2019-12-25 ENCOUNTER — Other Ambulatory Visit: Payer: Self-pay | Admitting: Family Medicine

## 2019-12-25 ENCOUNTER — Encounter: Payer: Self-pay | Admitting: Family Medicine

## 2019-12-25 ENCOUNTER — Other Ambulatory Visit: Payer: Self-pay

## 2019-12-25 VITALS — BP 98/48 | HR 76 | Ht 69.0 in | Wt 180.6 lb

## 2019-12-25 DIAGNOSIS — Z658 Other specified problems related to psychosocial circumstances: Secondary | ICD-10-CM

## 2019-12-25 DIAGNOSIS — F1019 Alcohol abuse with unspecified alcohol-induced disorder: Secondary | ICD-10-CM

## 2019-12-25 DIAGNOSIS — K7031 Alcoholic cirrhosis of liver with ascites: Secondary | ICD-10-CM

## 2019-12-25 DIAGNOSIS — K922 Gastrointestinal hemorrhage, unspecified: Secondary | ICD-10-CM

## 2019-12-25 DIAGNOSIS — D696 Thrombocytopenia, unspecified: Secondary | ICD-10-CM

## 2019-12-25 HISTORY — PX: IR PARACENTESIS: IMG2679

## 2019-12-25 LAB — CULTURE, BODY FLUID W GRAM STAIN -BOTTLE: Culture: NO GROWTH

## 2019-12-25 IMAGING — US IR PARACENTESIS
1 series · 4 of 4 positions shown · non-contrast
Comparison: none

INDICATION: Patient with a history of alcoholic cirrhosis and recurrent ascites.
Interventional radiology asked to perform a therapeutic
paracentesis.

[Series 1: ir (id) (id)/(id)/(id) ir · 4 of 4 slices shown]
[im 1/4]
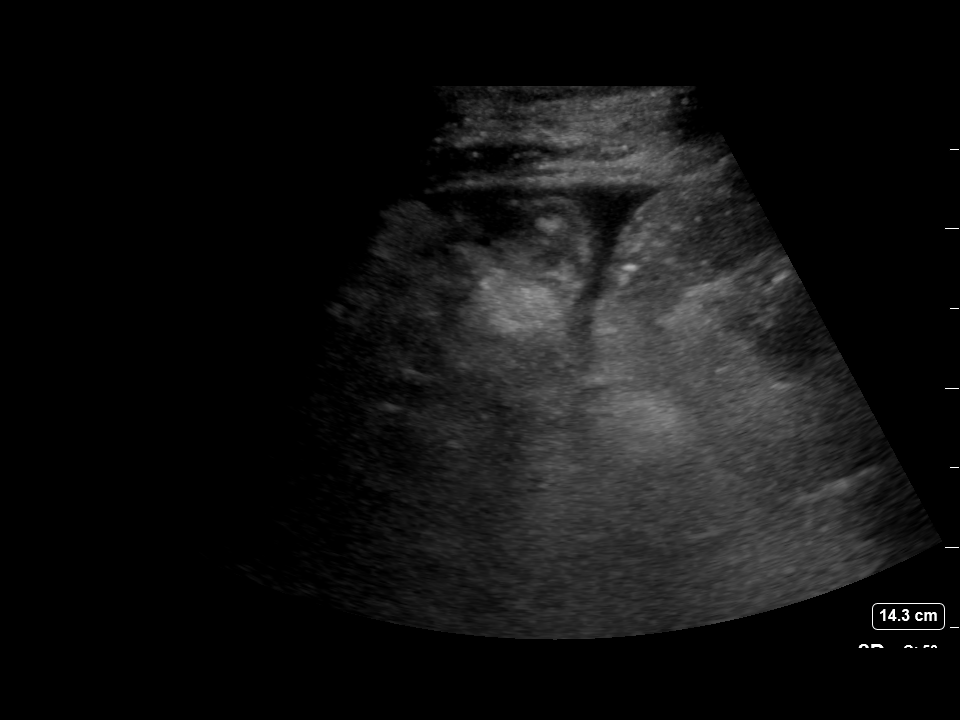
[im 2/4]
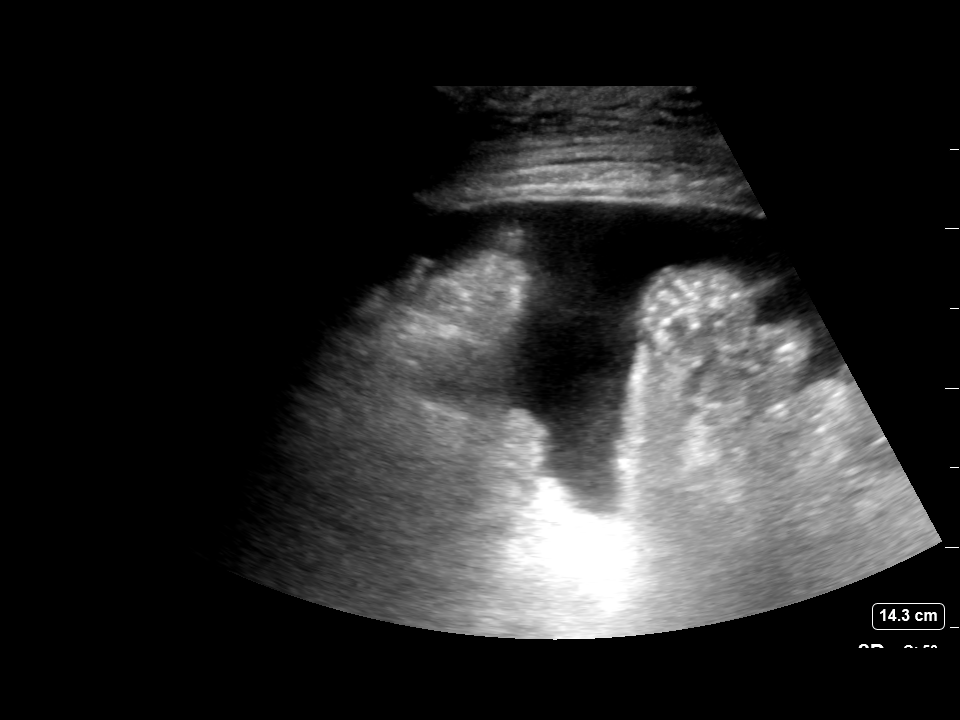
[im 3/4]
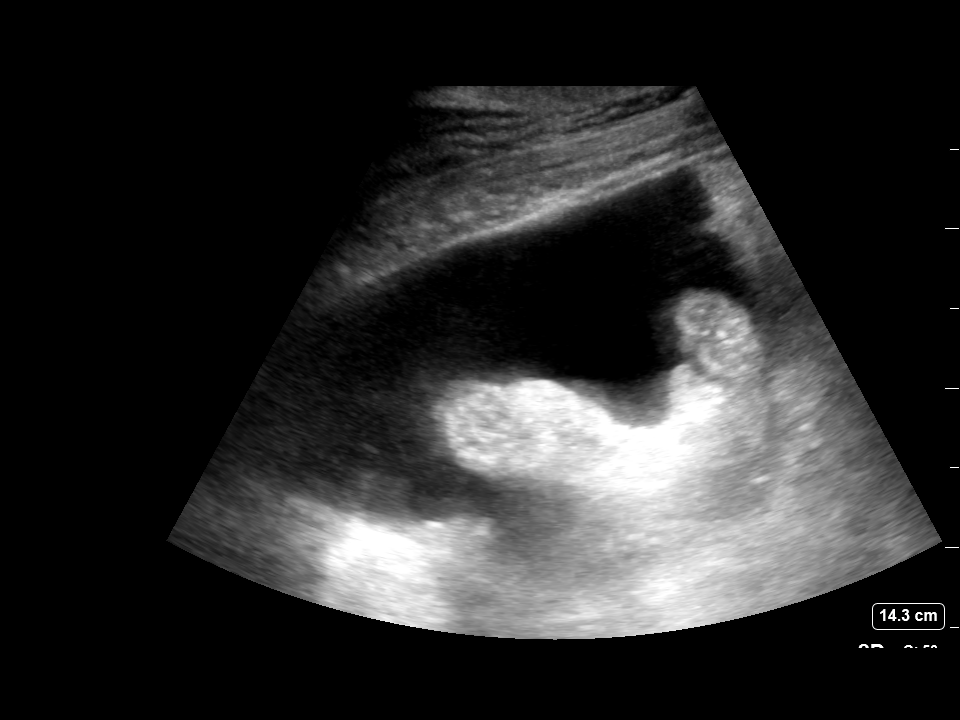
[im 4/4]
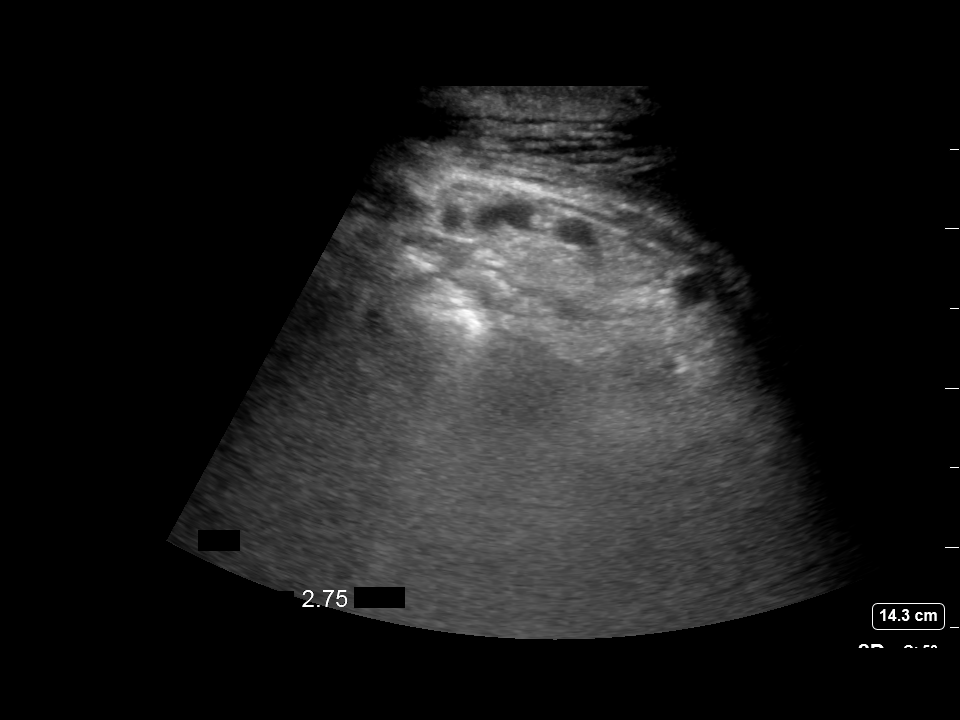

[4 of 4 positions shown; findings below may reference images not displayed]

EXAM:
ULTRASOUND GUIDED PARACENTESIS

MEDICATIONS:
1% lidocaine 10 mL

COMPLICATIONS:
None immediate.

PROCEDURE:
Informed written consent was obtained from the patient after a
discussion of the risks, benefits and alternatives to treatment. A
timeout was performed prior to the initiation of the procedure.

Initial ultrasound scanning demonstrates a large amount of ascites
within the left lower abdominal quadrant. The left lower abdomen was
prepped and draped in the usual sterile fashion. 1% lidocaine was
used for local anesthesia.

Following this, a 19 gauge, 7-cm, Yueh catheter was introduced. An
ultrasound image was saved for documentation purposes. The
paracentesis was performed. The catheter was removed and a dressing
was applied. The patient tolerated the procedure well without
immediate post procedural complication.
FINDINGS: A total of approximately 2.75 L of clear yellow fluid was removed.
IMPRESSION: Successful ultrasound-guided paracentesis yielding 2.75 liters of
peritoneal fluid. Read by: NAZARETH, NP

## 2019-12-25 MED ORDER — LIDOCAINE HCL 1 % IJ SOLN
INTRAMUSCULAR | Status: AC
  Filled 2019-12-25: qty 20

## 2019-12-25 MED ORDER — LORAZEPAM 0.5 MG PO TABS: ORAL_TABLET | ORAL | 1 refills | Status: DC

## 2019-12-25 MED FILL — LORazepam 0.5 MG TABS: 0.5 | 30 days supply | Qty: 30 | Fill #0

## 2019-12-25 NOTE — Procedures (Signed)
PROCEDURE SUMMARY:  Successful US guided paracentesis from left abdomen.  Yielded 2.75 L of clear yellow fluid.  No immediate complications.  Pt tolerated well.    EBL < 2 mL  Theresa Duty, NP 12/25/2019 3:29 PM

## 2019-12-25 NOTE — Patient Instructions (Signed)
I am calling in some low dose lorazepam. We will use that twice a day every day.  I will do blood work today and discuss it with you in next couple of days  I am including the resources

## 2019-12-26 ENCOUNTER — Other Ambulatory Visit: Payer: Self-pay | Admitting: *Deleted

## 2019-12-26 LAB — CBC
Hematocrit: 24.6 % — ABNORMAL LOW (ref 37.5–51.0)
Hemoglobin: 8.7 g/dL — ABNORMAL LOW (ref 13.0–17.7)
MCH: 28.6 pg (ref 26.6–33.0)
MCHC: 35.4 g/dL (ref 31.5–35.7)
MCV: 81 fL (ref 79–97)
Platelets: 267 10*3/uL (ref 150–450)
RBC: 3.04 x10E6/uL — ABNORMAL LOW (ref 4.14–5.80)
RDW: 18.1 % — ABNORMAL HIGH (ref 11.6–15.4)
WBC: 13.5 10*3/uL — ABNORMAL HIGH (ref 3.4–10.8)

## 2019-12-26 LAB — COMPREHENSIVE METABOLIC PANEL
ALT: 22 IU/L (ref 0–44)
AST: 45 IU/L — ABNORMAL HIGH (ref 0–40)
Albumin/Globulin Ratio: 0.9 — ABNORMAL LOW (ref 1.2–2.2)
Albumin: 3 g/dL — ABNORMAL LOW (ref 4.0–5.0)
Alkaline Phosphatase: 148 IU/L — ABNORMAL HIGH (ref 44–121)
BUN/Creatinine Ratio: 17 (ref 9–20)
BUN: 27 mg/dL — ABNORMAL HIGH (ref 6–24)
Bilirubin Total: 12.6 mg/dL (ref 0.0–1.2)
CO2: 17 mmol/L — ABNORMAL LOW (ref 20–29)
Calcium: 8.3 mg/dL — ABNORMAL LOW (ref 8.7–10.2)
Chloride: 95 mmol/L — ABNORMAL LOW (ref 96–106)
Creatinine, Ser: 1.55 mg/dL — ABNORMAL HIGH (ref 0.76–1.27)
GFR calc Af Amer: 62 mL/min/{1.73_m2} (ref >59)
GFR calc non Af Amer: 53 mL/min/{1.73_m2} — ABNORMAL LOW (ref >59)
Globulin, Total: 3.2 g/dL (ref 1.5–4.5)
Glucose: 131 mg/dL — ABNORMAL HIGH (ref 65–99)
Potassium: 5.3 mmol/L — ABNORMAL HIGH (ref 3.5–5.2)
Sodium: 125 mmol/L — ABNORMAL LOW (ref 134–144)
Total Protein: 6.2 g/dL (ref 6.0–8.5)

## 2019-12-26 LAB — PROTIME-INR
INR: 1.8 — ABNORMAL HIGH (ref 0.9–1.2)
Prothrombin Time: 18.3 s — ABNORMAL HIGH (ref 9.1–12.0)

## 2019-12-26 NOTE — Progress Notes (Signed)
    CHIEF COMPLAINT / HPI:  Here for hospital follow-up for GI bleed, coagulopathy and acute on chronic liver failure. He is feeling a little bit better than before he went in the hospital.  No further episodes of bleeding.  He continues to have intermittent abdominal pain but not quite as bad.  He has been totally abstinent from alcohol with the last drink on November 19.  He is currently staying with his parents.  He has a lot of questions about the plan moving forward.  PERTINENT  PMH / PSH: I have reviewed the patient's medications, allergies, past medical and surgical history, smoking status and updated in the EMR as appropriate.   OBJECTIVE:  BP (!) 98/48   Pulse 76   Ht 5\' 9"  (1.753 m)   Wt 180 lb 9.6 oz (81.9 kg)   SpO2 97%   BMI 26.67 kg/m  GENERAL: Well-developed male, obviously jaundiced.   HEENT: Bilateral icterus.  Pupils equal round reactive to light.  Extraocular muscles intact.   CV: Regular rate and rhythm LUNGS: Clear to auscultation bilaterally ABDOMEN: Distended.  Ascites present.  Emergency follow-up helping them understand understand and  ASSESSMENT / PLAN:   Liver cirrhosis ([place]) Reviewed medicines.  We will need to get him back into the hepatology clinic at [facility].  This will restart his transplant calendar from last date of alcohol.  He had EGD and banding while he was in the hospital.  He was scheduled for colonoscopy this month but I do not know that he needs to get that done this month.  Did not think there is any rash.  He is also scheduled for MRI of his abdomen this month and I think we probably should see if we can get that done.  Today he has paracentesis ordered.  We discussed low-salt diet.  Labs today.  I spent part of the visit with [name] and and I spent the rest of the visit with [name] and both of his parents answering questions.  Total time together 45 minutes.  Hx of  Upper GI bleed Check hemoglobin today An EGD and banding while in the hospital.   We will continue PPI.  Psychosocial stressors He is really having a hard time.  We had discontinued his narcotic pain medication and he is benzodiazepine that he has been taking for tendinosis upon discharge from the hospital.  He is quite anxious.  He had stopped the acamprosate and I recommend he start that again.  After discussion with our pharmacist, Dr. Valentina Lucks, I decided to prescribe low-dose lorazepam 0.5 mg twice daily.  I think the risk benefit ratio teeters in the side of benefit if we are able to keep him on the path of sobriety during this very difficult time for him.  He is having a lot of anxiety.  He had stopped the Wellbutrin.  Considering options in the future might be reconsider SSRI.  I will be following up with him by phone and probably repeat blood work next week.   Dorcas Mcmurray MD

## 2019-12-26 NOTE — Assessment & Plan Note (Signed)
Reviewed medicines.  We will need to get him back into the hepatology clinic at [facility].  This will restart his transplant calendar from last date of alcohol.  He had EGD and banding while he was in the hospital.  He was scheduled for colonoscopy this month but I do not know that he needs to get that done this month.  Did not think there is any rash.  He is also scheduled for MRI of his abdomen this month and I think we probably should see if we can get that done.  Today he has paracentesis ordered.  We discussed low-salt diet.  Labs today.  I spent part of the visit with [name] and and I spent the rest of the visit with [name] and both of his parents answering questions.  Total time together 45 minutes.

## 2019-12-26 NOTE — Assessment & Plan Note (Signed)
He is really having a hard time.  We had discontinued his narcotic pain medication and he is benzodiazepine that he has been taking for tendinosis upon discharge from the hospital.  He is quite anxious.  He had stopped the acamprosate and I recommend he start that again.  After discussion with our pharmacist, Dr. Valentina Lucks, I decided to prescribe low-dose lorazepam 0.5 mg twice daily.  I think the risk benefit ratio teeters in the side of benefit if we are able to keep him on the path of sobriety during this very difficult time for him.  He is having a lot of anxiety.  He had stopped the Wellbutrin.  Considering options in the future might be reconsider SSRI.  I will be following up with him by phone and probably repeat blood work next week.

## 2019-12-26 NOTE — Patient Outreach (Signed)
[place] [facility]) Care Management  12/26/2019  Joshua Hubbard 06-23-1975 [id number]   Transition of care call Referral received: 12/16/19 Initial outreach attempt: 12/23/19 Insurance: [place]    2nd unsuccessful telephone call to patient's preferred contact number in order to complete post hospital discharge transition of care assessment , no answer left HIPAA compliant message requesting return call.    Objective: Per the electronic medical record, Joshua Hubbard  was hospitalized at [facility] 11/20-11/27 for Syncope, Decompensated liver disease, secondary esophageal varices  with bleeding, Ascites due to alcoholic cirrhosis. Comorbidities   Include:  Alcoholic cirrhosis, esophageal varices, decompensated liver failure, followed at [facility] ,  Alcohol abuse.  He  was discharged to home on 12/21/19  without the need for home health services or durable medical equipment per the discharge summary.    Plan If no return call from patient will attempt 3rd outreach in the next 4 business days.   Joylene Draft, RN, BSN  [place] Management Coordinator  [phone]- Mobile [phone]- Toll Free Main Office

## 2019-12-26 NOTE — Progress Notes (Signed)
Bilirubin and INR improving; albumin slightly improved. Creatinine is bumped and this is new for him Hgb stable I think we need to recheck in one week

## 2019-12-26 NOTE — Assessment & Plan Note (Addendum)
Check hemoglobin today An EGD and banding while in the hospital.  We will continue PPI.

## 2019-12-27 MED ORDER — PANTOPRAZOLE SODIUM 40 MG PO TBEC
40.0000 mg | DELAYED_RELEASE_TABLET | Freq: Two times a day (BID) | ORAL | 3 refills | Status: DC
Start: 2019-12-27 — End: 2020-01-17

## 2019-12-27 NOTE — Telephone Encounter (Signed)
2nd request for a 90 day supply for this medication. Fabianna Keats Zimmerman Rumple, CMA

## 2019-12-27 NOTE — Addendum Note (Signed)
Addended byDorcas Mcmurray L on: 12/27/2019 02:08 PM   Modules accepted: Orders

## 2019-12-31 ENCOUNTER — Other Ambulatory Visit: Payer: Self-pay | Admitting: *Deleted

## 2019-12-31 ENCOUNTER — Encounter: Payer: Self-pay | Admitting: *Deleted

## 2019-12-31 ENCOUNTER — Other Ambulatory Visit: Payer: Self-pay | Admitting: Family Medicine

## 2019-12-31 DIAGNOSIS — K7031 Alcoholic cirrhosis of liver with ascites: Secondary | ICD-10-CM

## 2019-12-31 NOTE — Patient Outreach (Signed)
[facility] [facility]) Care Management  12/31/2019  Joshua Hubbard 08-14-1975 [id number]   Transition of care call/case closure   Referral received:12/16/19 Initial outreach:12/23/19 Insurance: [place]   Subjective: 3rd attempt  successful telephone call to patient's preferred number in order to complete transition of care assessment; 2 HIPAA identifiers verified. Explained purpose of call and completed transition of care assessment.  Joshua Hubbard states that he is getting around slow, he reports abdominal distention not increased since discharge and recent paracentesis.  He denies signs of bleeding noted, He is tolerating mobility at home he is staying with his parents at night and returns to his home during the day. He denies drinking alcohol, tolerating diet, denies bowel or bladder problems reports having several bowel movement during the day.  He reports having supportive parents  assisting with his recovery. He has follow up appointment at [facility] in the next week.   Reviewed accessing the following [place] Benefits : He discussed ongoing health issue with liver disease and history of alcohol abuse he says he  does not need a referral to one of the [place] chronic disease management programs. Discussed benefit of Employee assistance counseling states that he has contact number is needed. He reports having resource information and looking into a inpatient rehab for alcohol abuse states he is looking into cost and weighing all the options discussed having family support.  He states that he has the hospital indemnity plan and has filed claim earlier in the year unsure if can use it again will check.  He uses a [facility] outpatient pharmacy at [facility] . Patient denies additional educational or resource information.     Objective:  Joshua Hubbard hospitalized at [facility] 11/20-11/27 for Syncope, Decompensated liver disease, secondary  esophageal varices with bleeding, Ascites due to alcoholic cirrhosis. Comorbidities Include: Alcoholic cirrhosis, esophageal varices, decompensated liver failure, followed at [facility] , Alcohol abuse. He was discharged to home on 11/27/21without the need for home health services or durable medical equipment per the discharge summary.    Assessment:  Patient voices good understanding of all discharge instructions.  See transition of care flowsheet for assessment details.   Plan:  Reviewed hospital discharge diagnosis of Decompensated liver disease, esophageal varices,   and discharge treatment plan using hospital discharge instructions, assessing medication adherence, reviewing problems requiring provider notification, and discussing the importance of follow up with surgeon, primary care provider and/or specialists as directed.  Reviewed [place] healthy lifestyle program information to receive discounted premium for  2022   Step 1: Get  your annual physical  Step 2: Complete your health assessment  Step 3:Identify your current health status and complete the corresponding action step between January 1, and September 25, 2019.       No ongoing care management needs identified so will close case to [place] Management services.  Patient has received [facility] care management outreach letter. Joylene Draft, RN, BSN  [place] Management Coordinator  [phone]- Mobile [phone]- Toll Free Main Office

## 2020-01-01 ENCOUNTER — Other Ambulatory Visit: Payer: Self-pay | Admitting: Family Medicine

## 2020-01-01 ENCOUNTER — Other Ambulatory Visit: Payer: 59

## 2020-01-01 ENCOUNTER — Other Ambulatory Visit: Payer: Self-pay

## 2020-01-01 DIAGNOSIS — K7031 Alcoholic cirrhosis of liver with ascites: Secondary | ICD-10-CM

## 2020-01-01 MED FILL — GENERLAC SOLUTION 10G/15ML: 10 | 3 days supply | Qty: 236 | Fill #0

## 2020-01-02 LAB — CBC
Hematocrit: 24.1 % — ABNORMAL LOW (ref 37.5–51.0)
Hemoglobin: 8.6 g/dL — ABNORMAL LOW (ref 13.0–17.7)
MCH: 29.2 pg (ref 26.6–33.0)
MCHC: 35.7 g/dL (ref 31.5–35.7)
MCV: 82 fL (ref 79–97)
Platelets: 217 10*3/uL (ref 150–450)
RBC: 2.95 x10E6/uL — ABNORMAL LOW (ref 4.14–5.80)
RDW: 18.2 % — ABNORMAL HIGH (ref 11.6–15.4)
WBC: 8.2 10*3/uL (ref 3.4–10.8)

## 2020-01-02 LAB — COMPREHENSIVE METABOLIC PANEL
ALT: 22 IU/L (ref 0–44)
AST: 38 IU/L (ref 0–40)
Albumin/Globulin Ratio: 0.7 — ABNORMAL LOW (ref 1.2–2.2)
Albumin: 2.8 g/dL — ABNORMAL LOW (ref 4.0–5.0)
Alkaline Phosphatase: 138 IU/L — ABNORMAL HIGH (ref 44–121)
BUN/Creatinine Ratio: 10 (ref 9–20)
BUN: 12 mg/dL (ref 6–24)
Bilirubin Total: 8.4 mg/dL — ABNORMAL HIGH (ref 0.0–1.2)
CO2: 18 mmol/L — ABNORMAL LOW (ref 20–29)
Calcium: 8.2 mg/dL — ABNORMAL LOW (ref 8.7–10.2)
Chloride: 96 mmol/L (ref 96–106)
Creatinine, Ser: 1.19 mg/dL (ref 0.76–1.27)
GFR calc Af Amer: 85 mL/min/{1.73_m2} (ref >59)
GFR calc non Af Amer: 73 mL/min/{1.73_m2} (ref >59)
Globulin, Total: 3.8 g/dL (ref 1.5–4.5)
Glucose: 120 mg/dL — ABNORMAL HIGH (ref 65–99)
Potassium: 3.9 mmol/L (ref 3.5–5.2)
Sodium: 126 mmol/L — ABNORMAL LOW (ref 134–144)
Total Protein: 6.6 g/dL (ref 6.0–8.5)

## 2020-01-02 LAB — PROTIME-INR
INR: 1.7 — ABNORMAL HIGH (ref 0.9–1.2)
Prothrombin Time: 17.3 s — ABNORMAL HIGH (ref 9.1–12.0)

## 2020-01-03 ENCOUNTER — Other Ambulatory Visit: Payer: Self-pay | Admitting: Family Medicine

## 2020-01-03 DIAGNOSIS — K7031 Alcoholic cirrhosis of liver with ascites: Secondary | ICD-10-CM

## 2020-01-03 MED FILL — ACAMPROSATE CALC DR 333 MG: 333 | 30 days supply | Qty: 180 | Fill #1

## 2020-01-03 NOTE — Progress Notes (Signed)
Lvm Generally improved He has MRI Monday and we are setting up additional paracentesis for next week as well. Probably recheck labs week after.

## 2020-01-06 ENCOUNTER — Other Ambulatory Visit: Payer: Self-pay | Admitting: Family Medicine

## 2020-01-06 ENCOUNTER — Other Ambulatory Visit: Payer: Self-pay | Admitting: Physician Assistant

## 2020-01-06 ENCOUNTER — Other Ambulatory Visit (HOSPITAL_COMMUNITY): Payer: Self-pay | Admitting: Physician Assistant

## 2020-01-06 DIAGNOSIS — R9389 Abnormal findings on diagnostic imaging of other specified body structures: Secondary | ICD-10-CM | POA: Diagnosis not present

## 2020-01-06 DIAGNOSIS — R188 Other ascites: Secondary | ICD-10-CM | POA: Diagnosis not present

## 2020-01-06 DIAGNOSIS — K7210 Chronic hepatic failure without coma: Secondary | ICD-10-CM | POA: Diagnosis not present

## 2020-01-06 DIAGNOSIS — K766 Portal hypertension: Secondary | ICD-10-CM | POA: Diagnosis not present

## 2020-01-06 DIAGNOSIS — K7031 Alcoholic cirrhosis of liver with ascites: Secondary | ICD-10-CM

## 2020-01-06 DIAGNOSIS — K7460 Unspecified cirrhosis of liver: Secondary | ICD-10-CM | POA: Diagnosis not present

## 2020-01-07 ENCOUNTER — Other Ambulatory Visit: Payer: Self-pay

## 2020-01-07 ENCOUNTER — Other Ambulatory Visit (HOSPITAL_COMMUNITY): Payer: Self-pay | Admitting: Physician Assistant

## 2020-01-07 ENCOUNTER — Ambulatory Visit (HOSPITAL_COMMUNITY)
Admission: RE | Admit: 2020-01-07 | Discharge: 2020-01-07 | Disposition: A | Discharge status: Home / Self Care | Payer: 59 | Source: Ambulatory Visit | Attending: Physician Assistant | Admitting: Physician Assistant

## 2020-01-07 DIAGNOSIS — K7031 Alcoholic cirrhosis of liver with ascites: Secondary | ICD-10-CM

## 2020-01-07 DIAGNOSIS — R188 Other ascites: Secondary | ICD-10-CM

## 2020-01-07 HISTORY — PX: IR PARACENTESIS: IMG2679

## 2020-01-07 IMAGING — US IR PARACENTESIS
1 series · 2 of 2 positions shown · non-contrast
Comparison: none

INDICATION: Patient with history of alcoholic cirrhosis with recurrent ascites.
Request is for therapeutic paracentesis

[Series 1: ir (id) (id)/(id)/(id) ir · 2 of 2 slices shown]
[im 1/2]
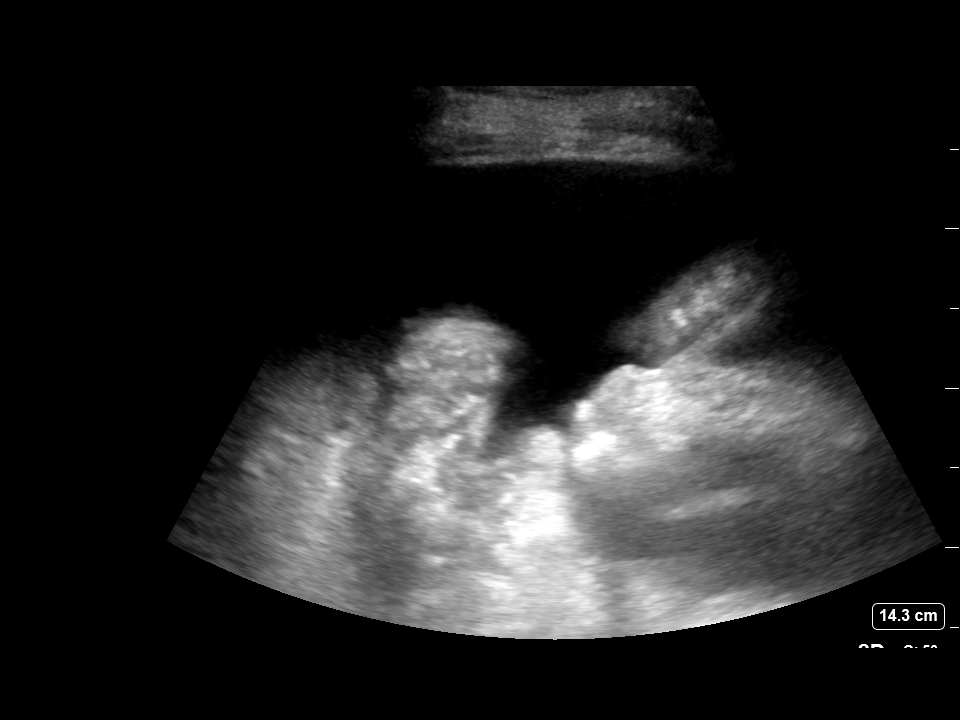
[im 2/2]
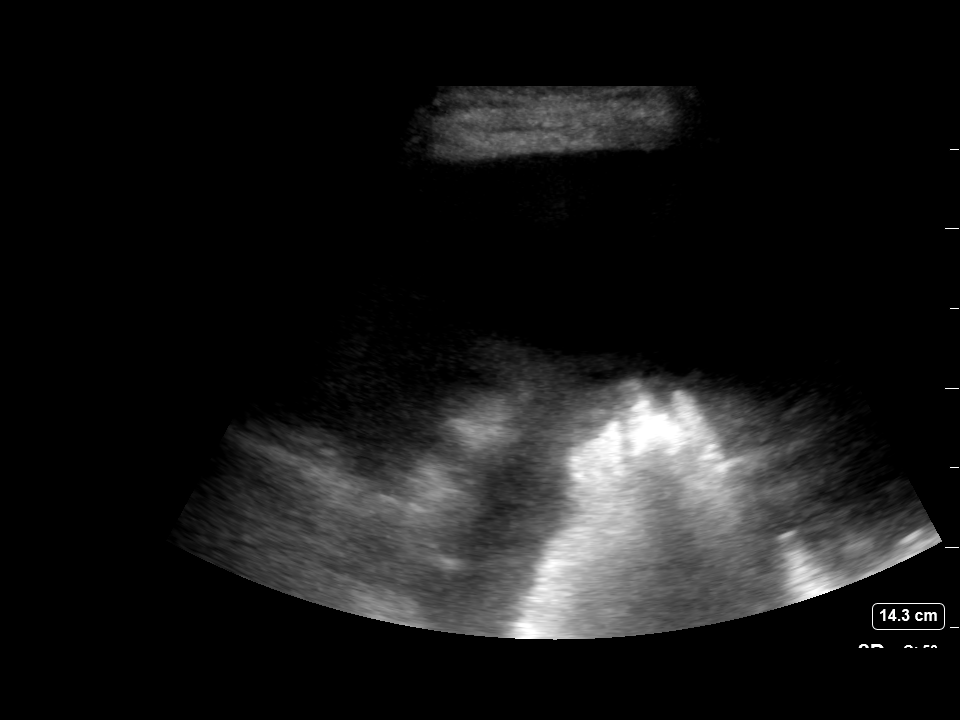

[2 of 2 positions shown; findings below may reference images not displayed]

EXAM:
ULTRASOUND GUIDED THERAPEUTIC PARACENTESIS

MEDICATIONS:
Lidocaine 1% 10 mL

COMPLICATIONS:
None immediate.

PROCEDURE:
Informed written consent was obtained from the patient after a
discussion of the risks, benefits and alternatives to treatment. A
timeout was performed prior to the initiation of the procedure.

Initial ultrasound scanning demonstrates a large amount of ascites
within the left lower abdominal quadrant. The left lower abdomen was
prepped and draped in the usual sterile fashion. 1% lidocaine was
used for local anesthesia.

Following this, a 19 gauge, 7-cm, Yueh catheter was introduced. An
ultrasound image was saved for documentation purposes. The
paracentesis was performed. The catheter was removed and a dressing
was applied. The patient tolerated the procedure well without
immediate post procedural complication.
Patient received post-procedure intravenous albumin; see nursing
notes for details.
FINDINGS: A total of approximately 6.9 L of straw-colored fluid was removed.
IMPRESSION: Successful ultrasound-guided therapeutic paracentesis yielding
liters of peritoneal fluid.

Read by: NUNGARAY, NP

## 2020-01-07 MED ORDER — ALBUMIN HUMAN 25 % IV SOLN
INTRAVENOUS | Status: AC
  Filled 2020-01-07: qty 200

## 2020-01-07 MED ORDER — LIDOCAINE HCL 1 % IJ SOLN
INTRAMUSCULAR | Status: AC
  Filled 2020-01-07: qty 20

## 2020-01-07 MED ORDER — LIDOCAINE HCL 1 % IJ SOLN
INTRAMUSCULAR | Status: AC | PRN
  Administered 2020-01-07: 10 mL

## 2020-01-07 MED ORDER — ALBUMIN HUMAN 25 % IV SOLN
50.0000 g | Freq: Once | INTRAVENOUS | Status: AC
  Administered 2020-01-07: 50 g via INTRAVENOUS

## 2020-01-07 NOTE — Procedures (Signed)
Ultrasound-guided  therapeutic paracentesis performed yielding 6.9 liters of straw colored fluid.  No immediate complications. EBL is none.

## 2020-01-08 ENCOUNTER — Other Ambulatory Visit: Payer: Self-pay | Admitting: Family Medicine

## 2020-01-08 MED ORDER — SPIRONOLACTONE 50 MG PO TABS
50.0000 mg | ORAL_TABLET | Freq: Every day | ORAL | 3 refills | Status: DC
Start: 2020-01-08 — End: 2020-01-08

## 2020-01-08 MED ORDER — LACTULOSE 10 GM/15ML PO SOLN
20.0000 g | Freq: Three times a day (TID) | ORAL | 12 refills | Status: DC
Start: 2020-01-08 — End: 2020-05-27

## 2020-01-08 MED ORDER — PROPRANOLOL HCL 10 MG PO TABS
10.0000 mg | ORAL_TABLET | Freq: Two times a day (BID) | ORAL | 3 refills | Status: DC
Start: 2020-01-08 — End: 2020-02-07

## 2020-01-08 MED ORDER — LORAZEPAM 0.5 MG PO TABS: ORAL_TABLET | ORAL | 1 refills | Status: DC

## 2020-01-08 MED ORDER — FOLIC ACID 1 MG PO TABS
1.0000 mg | ORAL_TABLET | Freq: Every day | ORAL | 3 refills | Status: DC
Start: 2020-01-08 — End: 2020-01-14

## 2020-01-08 MED ORDER — FUROSEMIDE 20 MG PO TABS
20.0000 mg | ORAL_TABLET | Freq: Every day | ORAL | 3 refills | Status: DC
Start: 2020-01-08 — End: 2020-04-10

## 2020-01-08 MED FILL — SPIRONOLACTONE 50 MG TABS: 50 | 89 days supply | Qty: 89 | Fill #0

## 2020-01-08 MED FILL — FUROSEMIDE 20 MG TABS: 20 | 90 days supply | Qty: 90 | Fill #0

## 2020-01-08 MED FILL — GENERLAC SOLUTION 10G/15ML: 10 | 31 days supply | Qty: 2838 | Fill #0

## 2020-01-10 ENCOUNTER — Other Ambulatory Visit: Payer: Self-pay | Admitting: Family Medicine

## 2020-01-10 ENCOUNTER — Other Ambulatory Visit: Payer: Self-pay

## 2020-01-10 ENCOUNTER — Encounter: Payer: Self-pay | Admitting: Family Medicine

## 2020-01-10 ENCOUNTER — Ambulatory Visit (HOSPITAL_COMMUNITY)
Admission: RE | Admit: 2020-01-10 | Discharge: 2020-01-10 | Disposition: A | Discharge status: Home / Self Care | Payer: 59 | Source: Ambulatory Visit | Attending: Family Medicine | Admitting: Family Medicine

## 2020-01-10 DIAGNOSIS — K7031 Alcoholic cirrhosis of liver with ascites: Secondary | ICD-10-CM | POA: Insufficient documentation

## 2020-01-10 DIAGNOSIS — K7682 Hepatic encephalopathy: Secondary | ICD-10-CM | POA: Insufficient documentation

## 2020-01-10 DIAGNOSIS — K7290 Hepatic failure, unspecified without coma: Secondary | ICD-10-CM | POA: Insufficient documentation

## 2020-01-10 HISTORY — PX: IR PARACENTESIS: IMG2679

## 2020-01-10 IMAGING — US IR PARACENTESIS
1 series · 3 of 3 positions shown · non-contrast
Comparison: none

INDICATION: Patient with history of alcoholic cirrhosis with recurrent ascites.
Request made for therapeutic paracentesis.

[Series 1: ir (id) (id)/(id)/(id) ir · 3 of 3 slices shown]
[im 1/3]
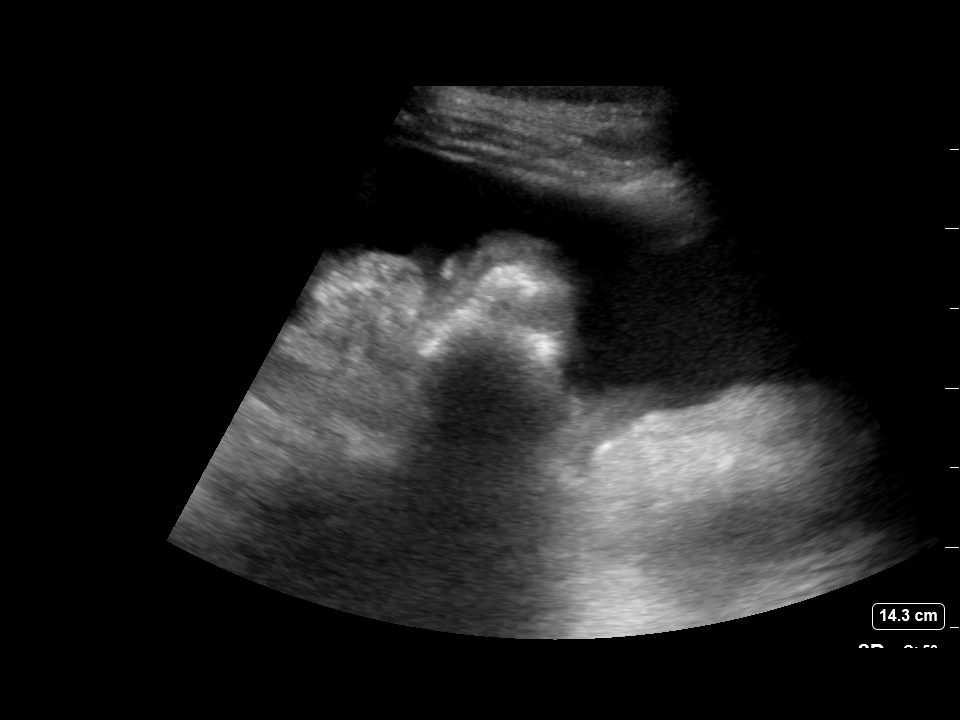
[im 2/3]
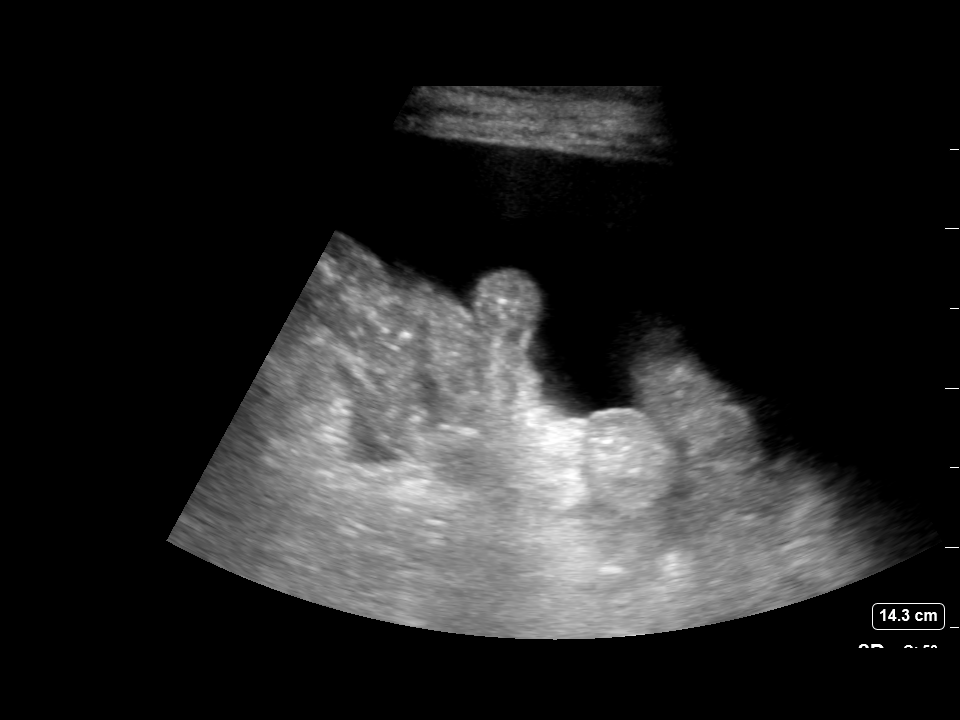
[im 3/3]
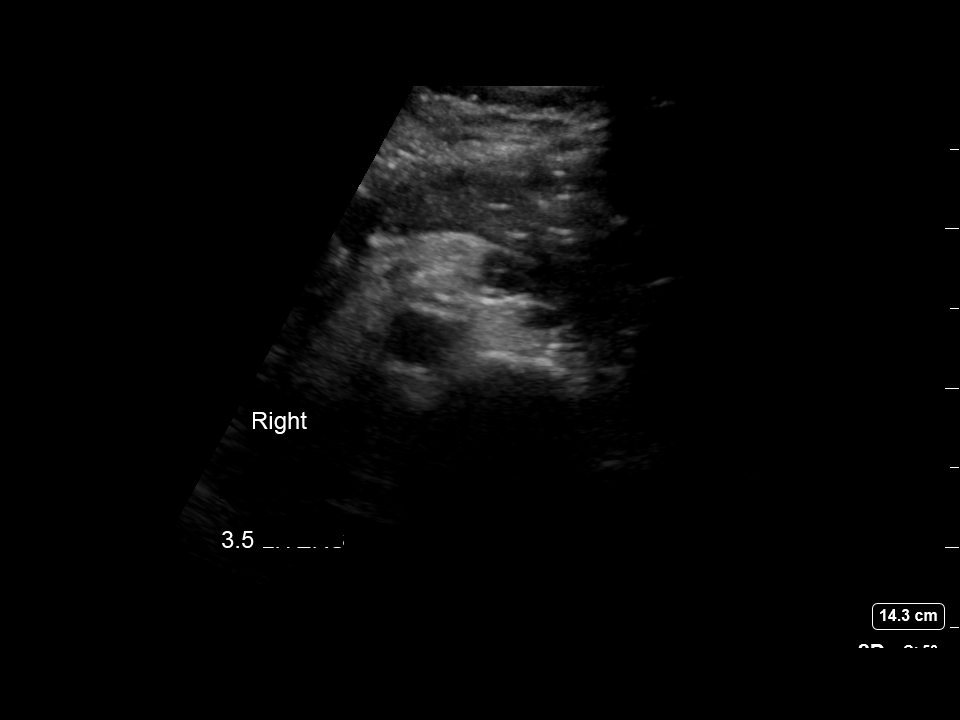

[3 of 3 positions shown; findings below may reference images not displayed]

EXAM:
ULTRASOUND GUIDED THERAPEUTIC PARACENTESIS

MEDICATIONS:
10 mL% lidocaine

COMPLICATIONS:
None immediate.

PROCEDURE:
Informed written consent was obtained from the patient after a
discussion of the risks, benefits and alternatives to treatment. A
timeout was performed prior to the initiation of the procedure.

Initial ultrasound scanning demonstrates a large amount of ascites
within the right lower abdominal quadrant. The right lower abdomen
was prepped and draped in the usual sterile fashion. 1% lidocaine
was used for local anesthesia.

Following this, a 19 gauge, 7-cm, Yueh catheter was introduced. An
ultrasound image was saved for documentation purposes. The
paracentesis was performed. The catheter was removed and a dressing
was applied. The patient tolerated the procedure well without
immediate post procedural complication.
FINDINGS: A total of approximately 3.5 L of clear gold fluid was removed.
IMPRESSION: Successful ultrasound-guided paracentesis yielding 3.5L of
peritoneal fluid.

## 2020-01-10 MED ORDER — LIDOCAINE HCL 1 % IJ SOLN
INTRAMUSCULAR | Status: AC
  Filled 2020-01-10: qty 20

## 2020-01-10 NOTE — Assessment & Plan Note (Signed)
Extend FMLA Paracentesis today and labs next week

## 2020-01-10 NOTE — Progress Notes (Signed)
Phone call with Joshua Hubbard. He is more confused. Fatigued. Will extend his FMLA thriough Feb 16, 2020. We will reconnect next week, or sooner if he needs. Have also put in for another paracentesis. He seems to be declining despite the apparent improvement from his MRI abdomen which I am scanning to chart.

## 2020-01-10 NOTE — Procedures (Signed)
PROCEDURE SUMMARY:  Successful image-guided paracentesis from the right lateral abdomen.  Yielded 3.5 liters of clear gold fluid.  No immediate complications.  EBL = 0 mL. Patient tolerated well.   Specimen was not sent for labs.  Please see imaging section of Epic for full dictation.   Claris Pong Saher Davee PA-C 01/10/2020 3:15 PM

## 2020-01-12 ENCOUNTER — Other Ambulatory Visit: Payer: Self-pay | Admitting: Family Medicine

## 2020-01-13 DIAGNOSIS — K7460 Unspecified cirrhosis of liver: Secondary | ICD-10-CM | POA: Diagnosis not present

## 2020-01-13 DIAGNOSIS — Z79899 Other long term (current) drug therapy: Secondary | ICD-10-CM | POA: Diagnosis not present

## 2020-01-13 DIAGNOSIS — I8500 Esophageal varices without bleeding: Secondary | ICD-10-CM | POA: Diagnosis not present

## 2020-01-13 DIAGNOSIS — K219 Gastro-esophageal reflux disease without esophagitis: Secondary | ICD-10-CM | POA: Diagnosis not present

## 2020-01-13 DIAGNOSIS — K766 Portal hypertension: Secondary | ICD-10-CM | POA: Diagnosis not present

## 2020-01-13 DIAGNOSIS — I8510 Secondary esophageal varices without bleeding: Secondary | ICD-10-CM | POA: Diagnosis not present

## 2020-01-13 DIAGNOSIS — K3189 Other diseases of stomach and duodenum: Secondary | ICD-10-CM | POA: Diagnosis not present

## 2020-01-13 DIAGNOSIS — I10 Essential (primary) hypertension: Secondary | ICD-10-CM | POA: Diagnosis not present

## 2020-01-15 ENCOUNTER — Ambulatory Visit (HOSPITAL_COMMUNITY)
Admission: RE | Admit: 2020-01-15 | Discharge: 2020-01-15 | Disposition: A | Discharge status: Home / Self Care | Payer: 59 | Source: Ambulatory Visit | Attending: Family Medicine | Admitting: Family Medicine

## 2020-01-15 ENCOUNTER — Other Ambulatory Visit: Payer: Self-pay

## 2020-01-15 DIAGNOSIS — K7031 Alcoholic cirrhosis of liver with ascites: Secondary | ICD-10-CM | POA: Insufficient documentation

## 2020-01-15 HISTORY — PX: IR PARACENTESIS: IMG2679

## 2020-01-15 IMAGING — US IR PARACENTESIS
1 series · 3 of 3 positions shown · non-contrast
Comparison: none

INDICATION: Patient with history of alcoholic cirrhosis with recurrent ascites.
Request is made for therapeutic paracentesis.

[Series 1: ir (id) (id)/(id)/(id) ir · 3 of 3 slices shown]
[im 1/3]
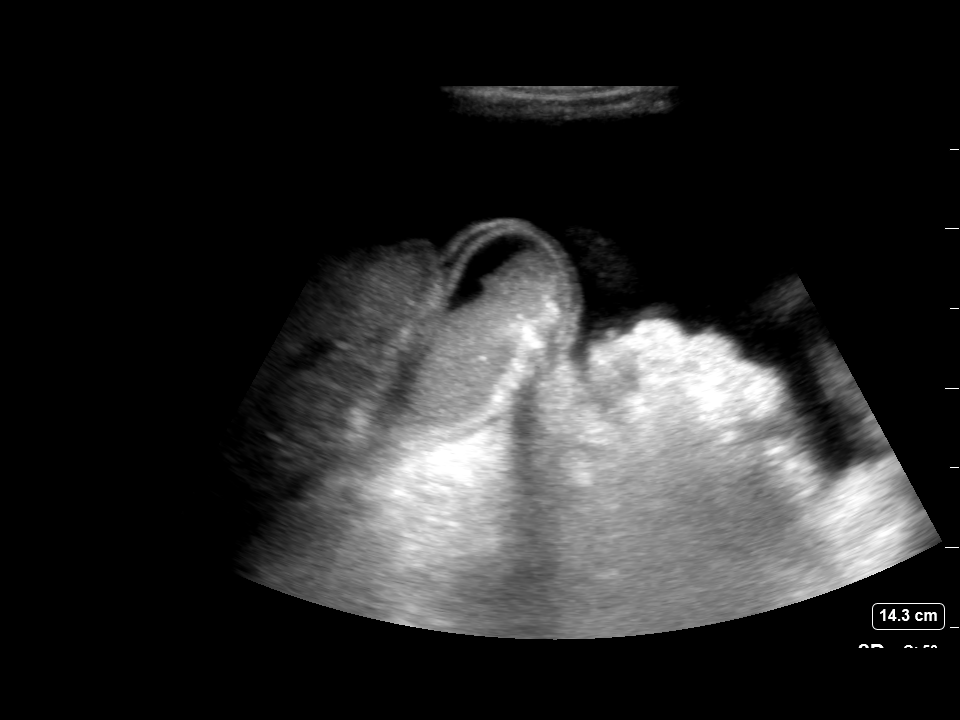
[im 2/3]
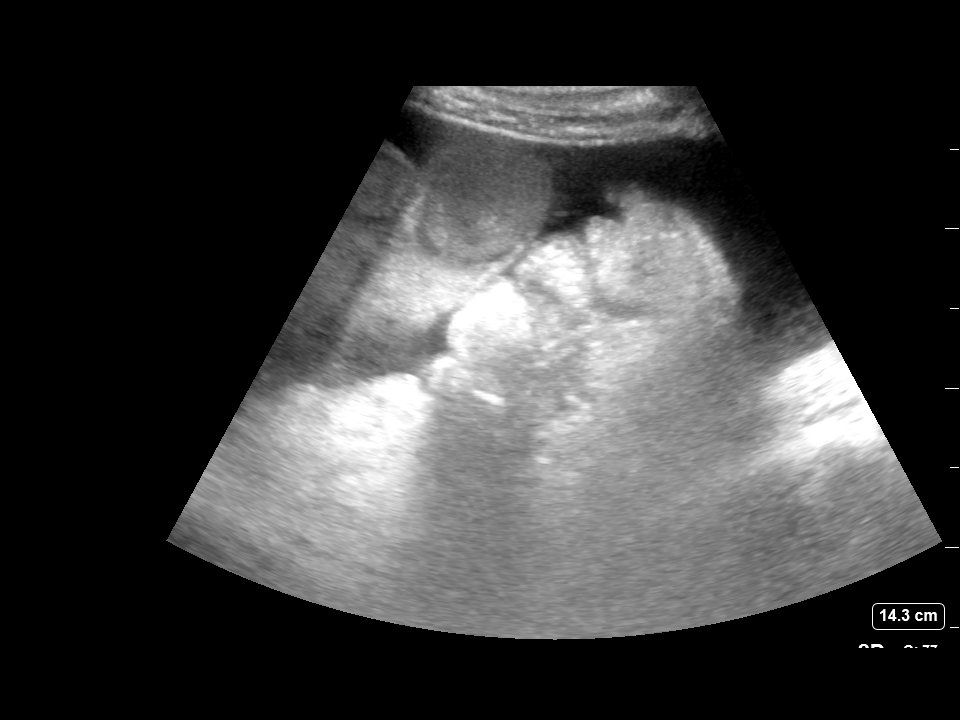
[im 3/3]
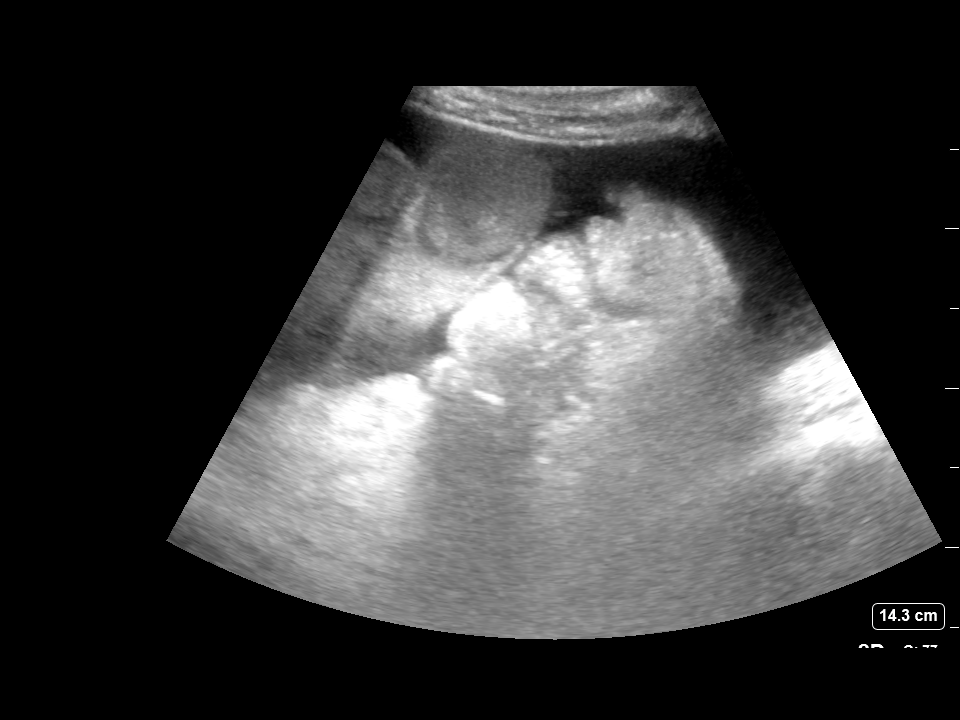

[3 of 3 positions shown; findings below may reference images not displayed]

EXAM:
ULTRASOUND GUIDED THERAPEUTIC PARACENTESIS

MEDICATIONS:
10 mL 1% lidocaine

COMPLICATIONS:
None immediate.

PROCEDURE:
Informed written consent was obtained from the patient after a
discussion of the risks, benefits and alternatives to treatment. A
timeout was performed prior to the initiation of the procedure.

Initial ultrasound scanning demonstrates a large amount of ascites
within the right lower abdominal quadrant. The right lower abdomen
was prepped and draped in the usual sterile fashion. 1% lidocaine
was used for local anesthesia.

Following this, a 19 gauge, 7-cm, Yueh catheter was introduced. An
ultrasound image was saved for documentation purposes. The
paracentesis was performed. The catheter was removed and a dressing
was applied. The patient tolerated the procedure well without
immediate post procedural complication.
FINDINGS: A total of approximately 4.1 L of hazy amber fluid was removed.
IMPRESSION: Successful ultrasound-guided paracentesis yielding 4.1 L of
peritoneal fluid.

## 2020-01-15 MED ORDER — LIDOCAINE HCL (PF) 1 % IJ SOLN
INTRAMUSCULAR | Status: DC | PRN
  Administered 2020-01-15: 10 mL

## 2020-01-15 MED ORDER — LIDOCAINE HCL 1 % IJ SOLN
INTRAMUSCULAR | Status: AC
  Filled 2020-01-15: qty 20

## 2020-01-15 NOTE — Procedures (Signed)
PROCEDURE SUMMARY:  Successful image-guided paracentesis from the right lateral abdomen.  Yielded 4.1 liters of hazy amber fluid.  No immediate complications.  EBL = 0 mL. Patient tolerated well.   Specimen was not sent for labs.  Please see imaging section of Epic for full dictation.   Claris Pong Cynai Skeens PA-C 01/15/2020 10:28 AM

## 2020-01-16 ENCOUNTER — Other Ambulatory Visit: Payer: Self-pay | Admitting: Family Medicine

## 2020-01-17 ENCOUNTER — Other Ambulatory Visit: Payer: Self-pay | Admitting: Family Medicine

## 2020-01-20 ENCOUNTER — Encounter: Payer: Self-pay | Admitting: Family Medicine

## 2020-01-20 MED FILL — PANTOPRAZOLE SOD DR 40 MG T: 40 | 90 days supply | Qty: 180 | Fill #0

## 2020-01-21 ENCOUNTER — Ambulatory Visit (HOSPITAL_COMMUNITY)
Admission: RE | Admit: 2020-01-21 | Discharge: 2020-01-21 | Disposition: A | Discharge status: Home / Self Care | Payer: 59 | Source: Ambulatory Visit | Attending: Physician Assistant | Admitting: Physician Assistant

## 2020-01-21 ENCOUNTER — Other Ambulatory Visit: Payer: Self-pay

## 2020-01-21 DIAGNOSIS — R188 Other ascites: Secondary | ICD-10-CM | POA: Diagnosis not present

## 2020-01-21 DIAGNOSIS — K7031 Alcoholic cirrhosis of liver with ascites: Secondary | ICD-10-CM | POA: Diagnosis not present

## 2020-01-21 HISTORY — PX: IR PARACENTESIS: IMG2679

## 2020-01-21 IMAGING — US IR PARACENTESIS
1 series · 4 of 4 positions shown · non-contrast
Comparison: none

INDICATION: Patient with a history of alcoholic cirrhosis with recurrent
ascites. Interventional radiology asked to perform a therapeutic
paracentesis.

[Series 1: ir (id) (id)/(id)/(id) ir · 4 of 4 slices shown]
[im 1/4]
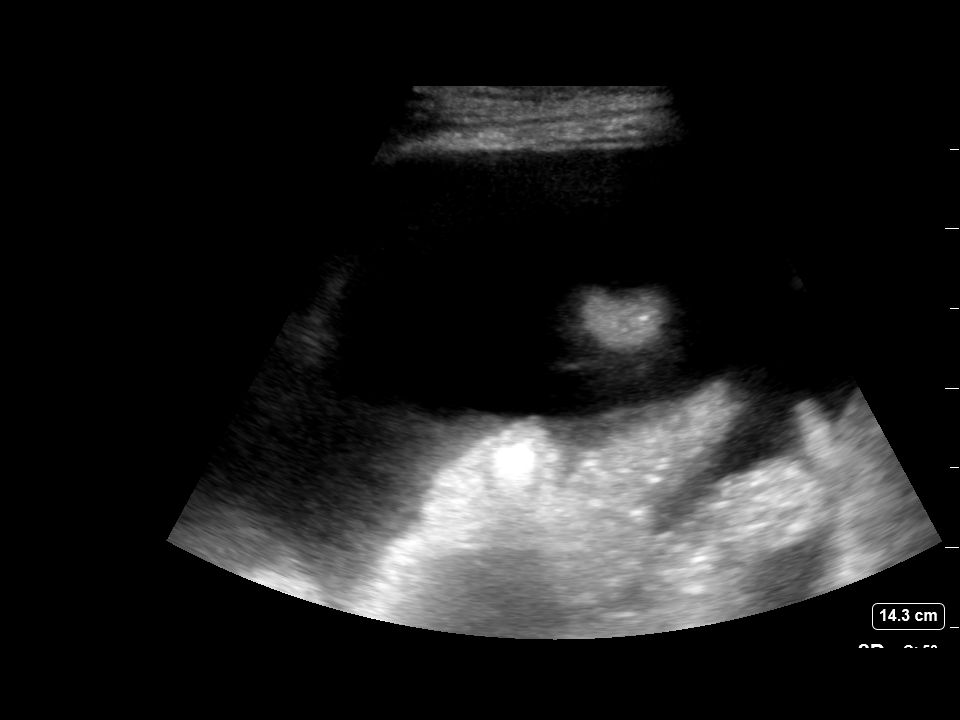
[im 2/4]
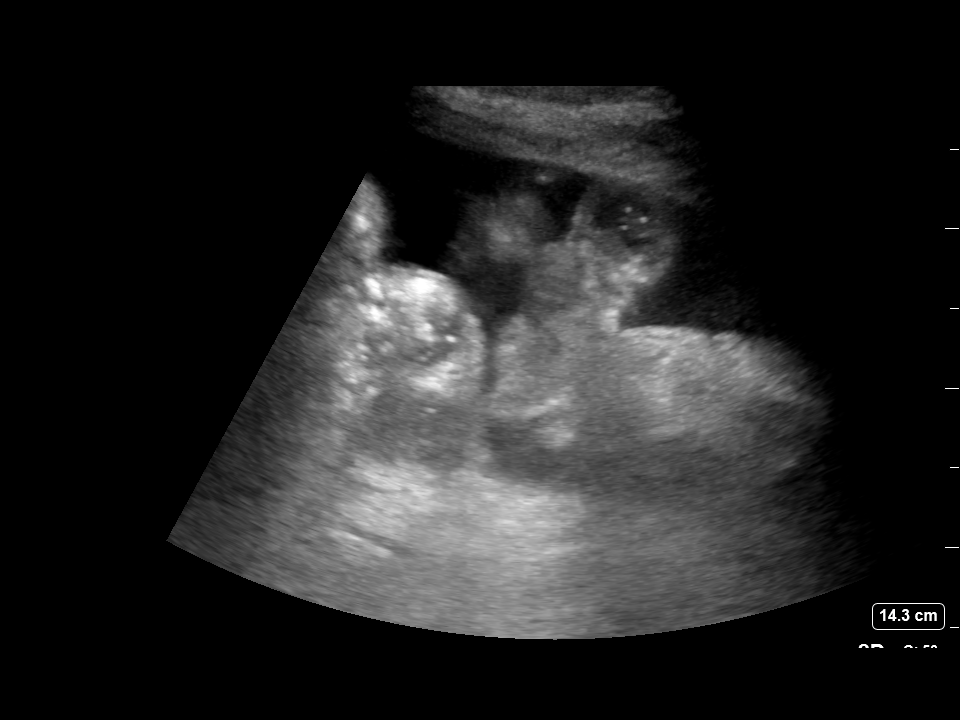
[im 3/4]
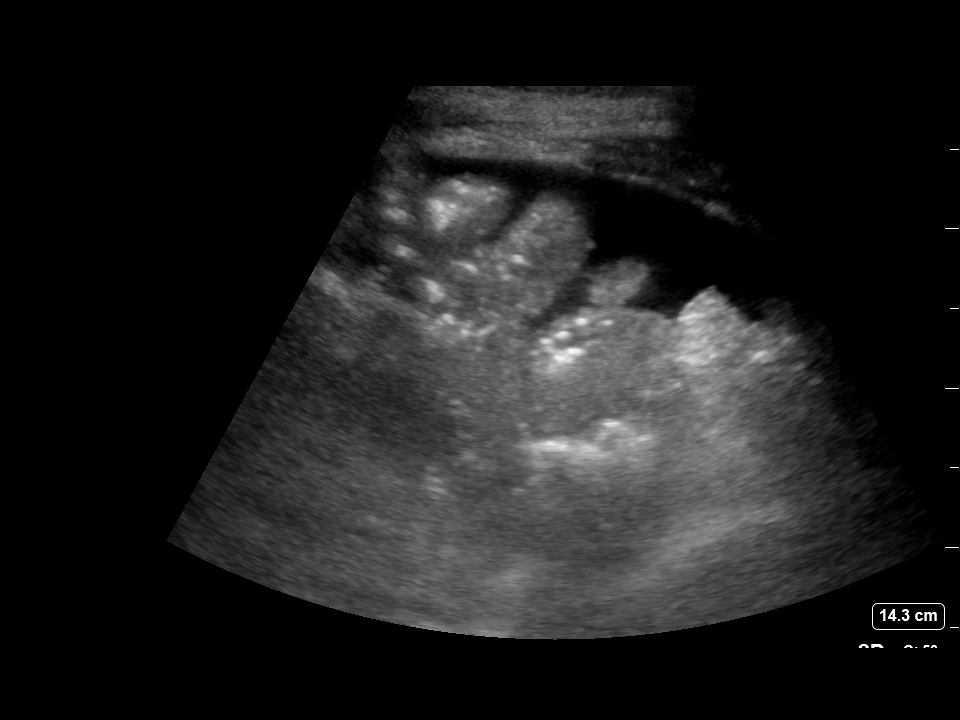
[im 4/4]
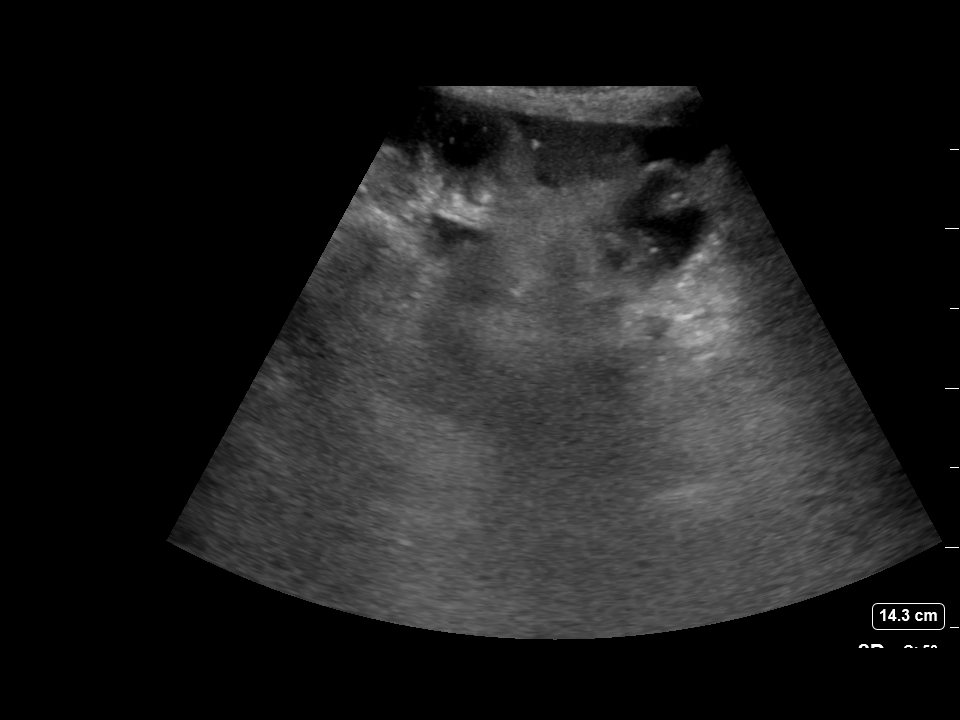

[4 of 4 positions shown; findings below may reference images not displayed]

EXAM:
ULTRASOUND GUIDED PARACENTESIS

MEDICATIONS:
1% lidocaine 10 mL

COMPLICATIONS:
None immediate.

PROCEDURE:
Informed written consent was obtained from the patient after a
discussion of the risks, benefits and alternatives to treatment. A
timeout was performed prior to the initiation of the procedure.

Initial ultrasound scanning demonstrates a large amount of ascites
within the left lower abdominal quadrant. The left lower abdomen was
prepped and draped in the usual sterile fashion. 1% lidocaine was
used for local anesthesia.

Following this, a 19 gauge, 7-cm, Yueh catheter was introduced. An
ultrasound image was saved for documentation purposes. The
paracentesis was performed. The catheter was removed and a dressing
was applied. The patient tolerated the procedure well without
immediate post procedural complication.
Patient received post-procedure intravenous albumin; see nursing
notes for details.
FINDINGS: A total of approximately 4.9 L of clear yellow fluid was removed.
IMPRESSION: Successful ultrasound-guided paracentesis yielding 4.9 liters of
peritoneal fluid. Read by: DESTANIE, NP

## 2020-01-21 MED ORDER — LIDOCAINE HCL 1 % IJ SOLN
INTRAMUSCULAR | Status: AC
  Filled 2020-01-21: qty 20

## 2020-01-21 MED ORDER — LIDOCAINE HCL 1 % IJ SOLN
INTRAMUSCULAR | Status: AC | PRN
  Administered 2020-01-21: 20 mL via INTRADERMAL

## 2020-01-21 MED ORDER — ALBUMIN HUMAN 25 % IV SOLN
INTRAVENOUS | Status: AC
  Filled 2020-01-21: qty 200

## 2020-01-21 MED ORDER — ALBUMIN HUMAN 25 % IV SOLN
50.0000 g | Freq: Once | INTRAVENOUS | Status: AC
  Administered 2020-01-21: 50 g via INTRAVENOUS

## 2020-01-21 NOTE — Procedures (Signed)
PROCEDURE SUMMARY:  Successful US guided paracentesis from left abdomen.  Yielded 4.9 L of clear yellow fluid.  No immediate complications.  Pt tolerated well.   EBL < 2 mL  Mickie Kay, NP 01/21/2020 10:28 AM

## 2020-01-22 ENCOUNTER — Encounter: Payer: Self-pay | Admitting: Family Medicine

## 2020-01-22 MED FILL — LORazepam 0.5 MG TABS: 0.5 | 30 days supply | Qty: 30 | Fill #1

## 2020-01-28 ENCOUNTER — Other Ambulatory Visit: Payer: Self-pay | Admitting: Physician Assistant

## 2020-01-28 DIAGNOSIS — K7030 Alcoholic cirrhosis of liver without ascites: Secondary | ICD-10-CM

## 2020-01-29 ENCOUNTER — Ambulatory Visit: Payer: 59 | Admitting: Family Medicine

## 2020-01-29 ENCOUNTER — Other Ambulatory Visit: Payer: Self-pay

## 2020-01-29 ENCOUNTER — Ambulatory Visit (HOSPITAL_COMMUNITY)
Admission: RE | Admit: 2020-01-29 | Discharge: 2020-01-29 | Disposition: A | Discharge status: Home / Self Care | Payer: No Typology Code available for payment source | Source: Ambulatory Visit | Attending: Physician Assistant | Admitting: Physician Assistant

## 2020-01-29 ENCOUNTER — Encounter: Payer: Self-pay | Admitting: Family Medicine

## 2020-01-29 VITALS — BP 110/60 | HR 79 | Ht 69.0 in | Wt 165.0 lb

## 2020-01-29 DIAGNOSIS — K7030 Alcoholic cirrhosis of liver without ascites: Secondary | ICD-10-CM | POA: Diagnosis present

## 2020-01-29 DIAGNOSIS — K7031 Alcoholic cirrhosis of liver with ascites: Secondary | ICD-10-CM

## 2020-01-29 DIAGNOSIS — D696 Thrombocytopenia, unspecified: Secondary | ICD-10-CM | POA: Diagnosis not present

## 2020-01-29 DIAGNOSIS — Z23 Encounter for immunization: Secondary | ICD-10-CM | POA: Diagnosis not present

## 2020-01-29 HISTORY — PX: IR PARACENTESIS: IMG2679

## 2020-01-29 IMAGING — US IR PARACENTESIS
1 series · 5 of 5 positions shown · non-contrast
Comparison: none

INDICATION: Patient with history of ETOH cirrhosis with recurrent ascites.
Request to IR for therapeutic paracentesis.

[Series 1: ir (id) (id)/(id)/(id) ir · 5 of 5 slices shown]
[im 1/5]
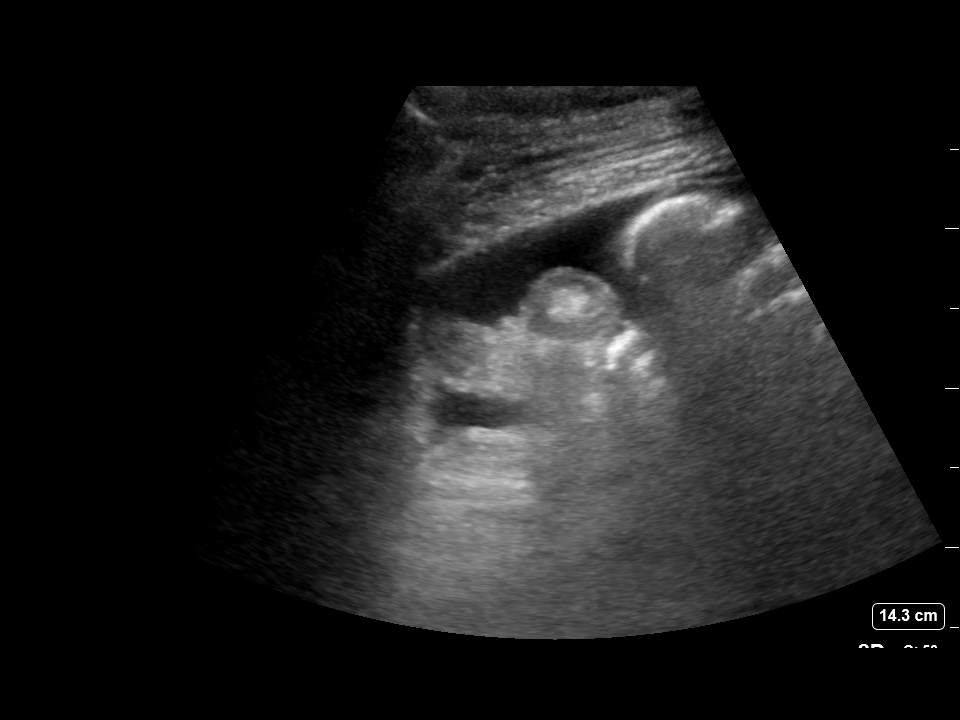
[im 2/5]
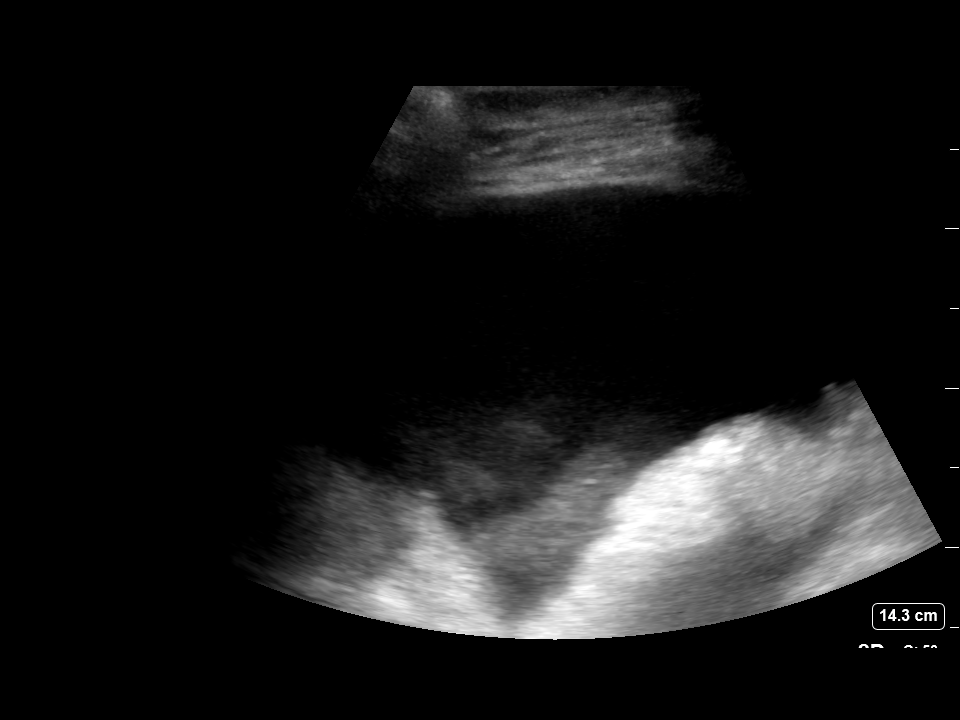
[im 3/5]
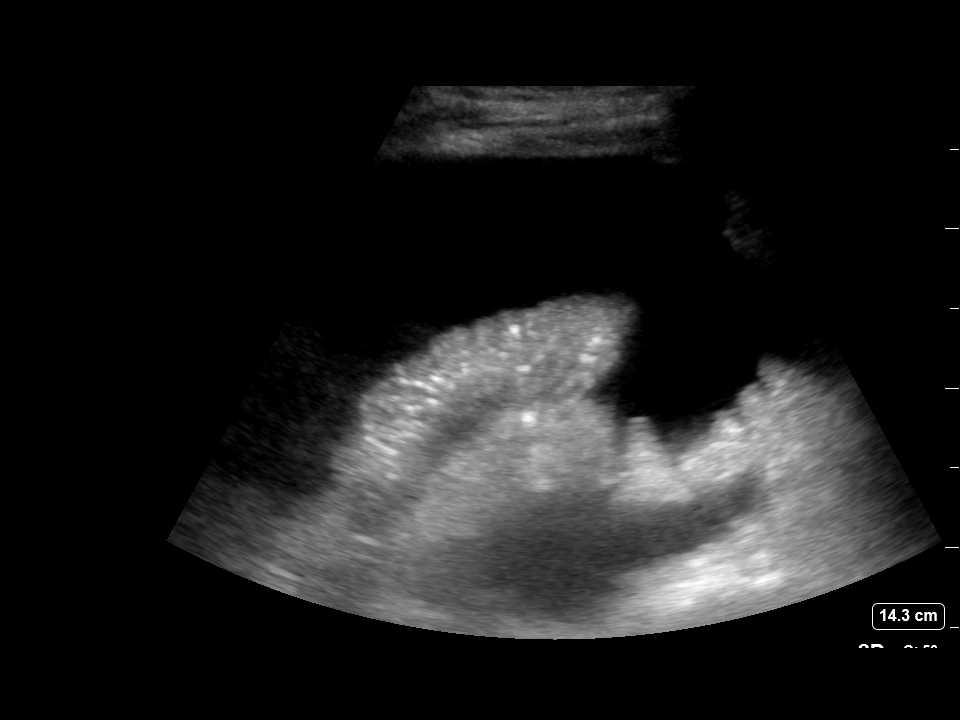
[im 4/5]
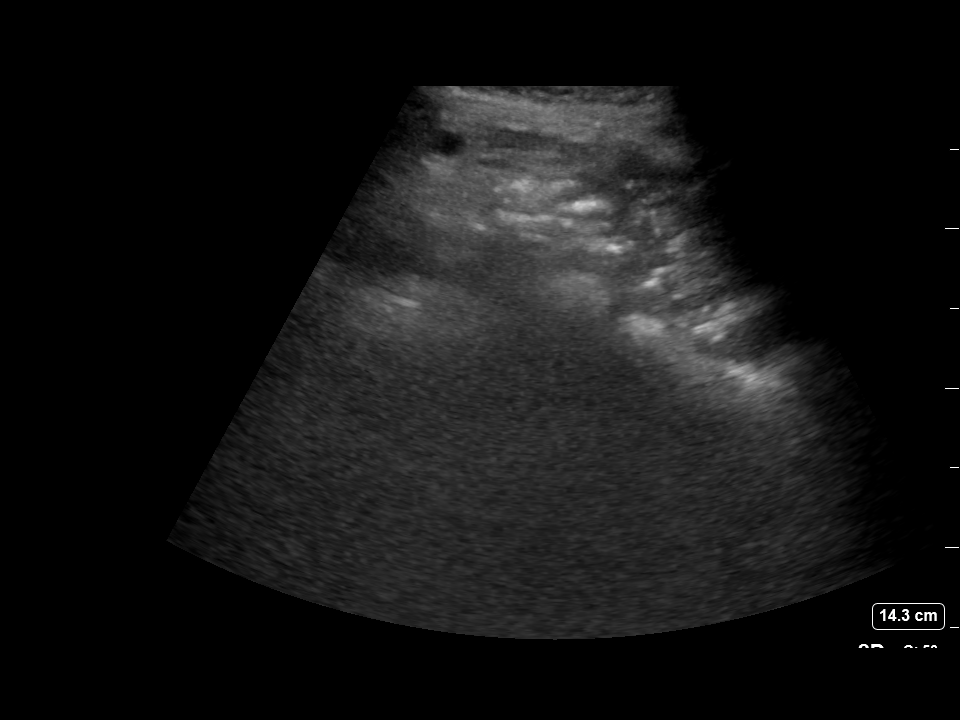
[im 5/5]
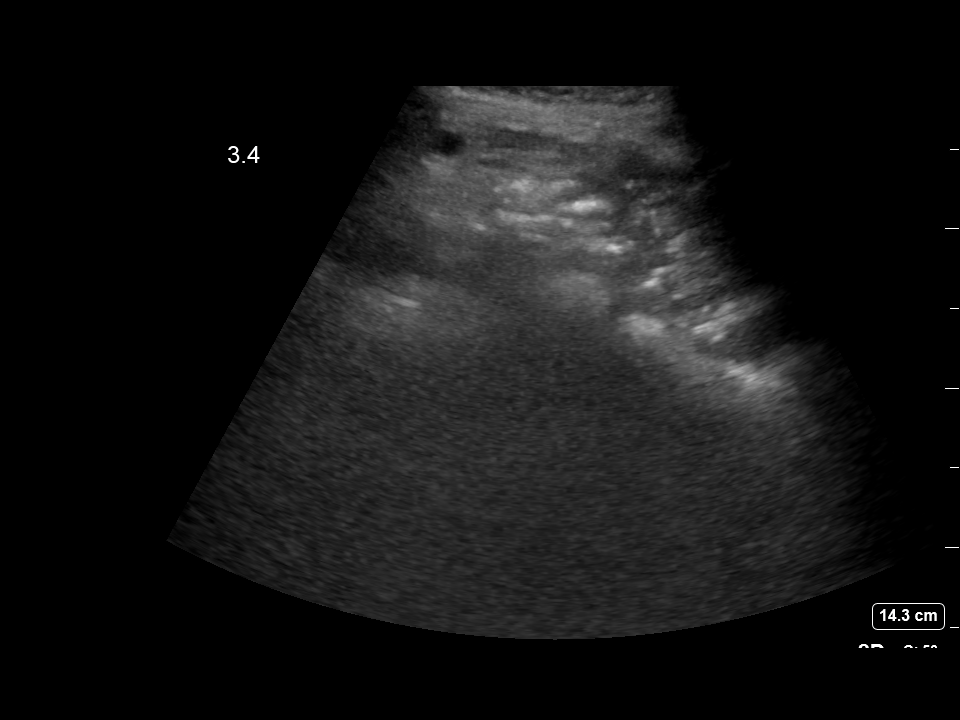

[5 of 5 positions shown; findings below may reference images not displayed]

EXAM:
ULTRASOUND GUIDED THERAPEUTIC PARACENTESIS

MEDICATIONS:
10 mL 1% lidocaine

COMPLICATIONS:
None immediate.

PROCEDURE:
Informed written consent was obtained from the patient after a
discussion of the risks, benefits and alternatives to treatment. A
timeout was performed prior to the initiation of the procedure.

Initial ultrasound scanning demonstrates a large amount of ascites
within the right lower abdominal quadrant. The right lower abdomen
was prepped and draped in the usual sterile fashion. 1% lidocaine
was used for local anesthesia.

Following this, a 19 gauge, 7-cm, Yueh catheter was introduced. An
ultrasound image was saved for documentation purposes. The
paracentesis was performed. The catheter was removed and a dressing
was applied. The patient tolerated the procedure well without
immediate post procedural complication.
Patient received post-procedure intravenous albumin; see nursing
notes for details.
FINDINGS: A total of approximately 3.4 L of clear, golden yellow fluid was
removed.
IMPRESSION: Successful ultrasound-guided paracentesis yielding 3.4 liters of
peritoneal fluid.

Read by JOHNSEN

## 2020-01-29 MED ORDER — LIDOCAINE HCL 1 % IJ SOLN
INTRAMUSCULAR | Status: AC
  Filled 2020-01-29: qty 20

## 2020-01-29 MED ORDER — ALBUMIN HUMAN 25 % IV SOLN
INTRAVENOUS | Status: AC
  Administered 2020-01-29: 50 g via INTRAVENOUS
  Filled 2020-01-29: qty 200

## 2020-01-29 MED ORDER — ALBUMIN HUMAN 25 % IV SOLN
50.0000 g | Freq: Once | INTRAVENOUS | Status: AC
  Filled 2020-01-29: qty 200

## 2020-01-29 MED ORDER — LIDOCAINE HCL 1 % IJ SOLN
INTRAMUSCULAR | Status: AC | PRN
  Administered 2020-01-29: 10 mL

## 2020-01-29 NOTE — Procedures (Signed)
PROCEDURE SUMMARY:  Successful image-guided paracentesis from the left lower abdomen abdomen.  Yielded 3.4 liters of clear, golden yellow fluid.  No immediate complications.  EBL: zero Patient tolerated well.   Specimen was not sent for labs.  Please see imaging section of Epic for full dictation.  Villa Herb PA-C 01/29/2020 1:04 PM

## 2020-01-30 ENCOUNTER — Encounter: Payer: Self-pay | Admitting: Family Medicine

## 2020-01-30 LAB — CBC
Hematocrit: 23.8 % — ABNORMAL LOW (ref 37.5–51.0)
Hemoglobin: 8.1 g/dL — ABNORMAL LOW (ref 13.0–17.7)
MCH: 28.3 pg (ref 26.6–33.0)
MCHC: 34 g/dL (ref 31.5–35.7)
MCV: 83 fL (ref 79–97)
Platelets: 179 10*3/uL (ref 150–450)
RBC: 2.86 x10E6/uL — ABNORMAL LOW (ref 4.14–5.80)
RDW: 15.5 % — ABNORMAL HIGH (ref 11.6–15.4)
WBC: 5.1 10*3/uL (ref 3.4–10.8)

## 2020-01-30 LAB — COMPREHENSIVE METABOLIC PANEL
ALT: 23 IU/L (ref 0–44)
AST: 48 IU/L — ABNORMAL HIGH (ref 0–40)
Albumin/Globulin Ratio: 0.7 — ABNORMAL LOW (ref 1.2–2.2)
Albumin: 2.8 g/dL — ABNORMAL LOW (ref 4.0–5.0)
Alkaline Phosphatase: 103 IU/L (ref 44–121)
BUN/Creatinine Ratio: 9 (ref 9–20)
BUN: 7 mg/dL (ref 6–24)
Bilirubin Total: 4.2 mg/dL — ABNORMAL HIGH (ref 0.0–1.2)
CO2: 21 mmol/L (ref 20–29)
Calcium: 8.4 mg/dL — ABNORMAL LOW (ref 8.7–10.2)
Chloride: 94 mmol/L — ABNORMAL LOW (ref 96–106)
Creatinine, Ser: 0.79 mg/dL (ref 0.76–1.27)
GFR calc Af Amer: 125 mL/min/{1.73_m2} (ref >59)
GFR calc non Af Amer: 108 mL/min/{1.73_m2} (ref >59)
Globulin, Total: 3.8 g/dL (ref 1.5–4.5)
Glucose: 102 mg/dL — ABNORMAL HIGH (ref 65–99)
Potassium: 3.9 mmol/L (ref 3.5–5.2)
Sodium: 127 mmol/L — ABNORMAL LOW (ref 134–144)
Total Protein: 6.6 g/dL (ref 6.0–8.5)

## 2020-01-30 LAB — PROTIME-INR
INR: 1.5 — ABNORMAL HIGH (ref 0.9–1.2)
Prothrombin Time: 15.1 s — ABNORMAL HIGH (ref 9.1–12.0)

## 2020-01-30 NOTE — Progress Notes (Signed)
Labs show stable: sodium, hemoglobin and liver function IMPROVEMENT in INR, platelets, bilirubin, kidney function,

## 2020-01-30 NOTE — Assessment & Plan Note (Signed)
30 minute face to face discussion Will allow return to work at 32 hours, no lifting pushing or pulling > 10 pounds Discussed rehab programs Continue current meds Labs today I will fill out FMLA update Paracentesis today

## 2020-01-30 NOTE — Progress Notes (Signed)
    CHIEF COMPLAINT / HPI: F/u alcoholic cirrhosis Remains a=bstinent Feeling better. Mentation more clear, energy level and appetite better.  Getting regular paracentesis with albumen and that seems to help a lot Wants  To go back to work. 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 No bleeding.   PERTINENT  PMH / PSH: I have reviewed the patient's medications, allergies, past medical and surgical history, smoking status and updated in the EMR as appropriate.   OBJECTIVE:  BP 110/60   Pulse 79   Ht 5\' 9"  (1.753 m)   Wt 165 lb (74.8 kg)   SpO2 99%   BMI 24.37 kg/m   Vital signs reviewed. GENERAL: Well-developed, well-nourished, no acute distress. CARDIOVASCULAR: Regular rate and rhythm no murmur gallop or rub LUNGS: Clear to auscultation bilaterally, no rales or wheeze. ABDOMEN: t positive bowel sounds. Tense with fluid.  NEURO: No gross focal neurological deficits. MSK: Movement of extremity x 4. Mild sarcopenia SKIN no jaundice noted  ASSESSMENT / PLAN:   Liver cirrhosis (HCC) 30 minute face to face discussion Will allow return to work at 32 hours, no lifting pushing or pulling > 10 pounds Discussed rehab programs Continue current meds Labs today I will fill out FMLA update Paracentesis today   MD

## 2020-02-03 ENCOUNTER — Other Ambulatory Visit: Payer: Self-pay | Admitting: Family Medicine

## 2020-02-03 DIAGNOSIS — K7031 Alcoholic cirrhosis of liver with ascites: Secondary | ICD-10-CM

## 2020-02-05 ENCOUNTER — Ambulatory Visit (HOSPITAL_COMMUNITY)
Admission: RE | Admit: 2020-02-05 | Discharge: 2020-02-05 | Disposition: A | Discharge status: Home / Self Care | Payer: No Typology Code available for payment source | Source: Ambulatory Visit | Attending: Family Medicine | Admitting: Family Medicine

## 2020-02-05 ENCOUNTER — Other Ambulatory Visit: Payer: Self-pay | Admitting: Family Medicine

## 2020-02-05 ENCOUNTER — Other Ambulatory Visit: Payer: Self-pay

## 2020-02-05 DIAGNOSIS — K7031 Alcoholic cirrhosis of liver with ascites: Secondary | ICD-10-CM | POA: Diagnosis present

## 2020-02-05 IMAGING — US IR ABDOMEN US LIMITED
1 series · 4 of 4 positions shown · non-contrast
Comparison: [DATE]

CLINICAL DATA: 44-year-old male with history of alcoholic cirrhosis
and recurrent ascites presenting for routine paracentesis.

EXAM:
LIMITED ABDOMEN ULTRASOUND FOR ASCITES
TECHNIQUE: Limited ultrasound survey for ascites was performed in all four
abdominal quadrants.

[Series 1: ir (id) (id)/(id)/(id) ir · 4 of 4 slices shown]
[im 1/4]
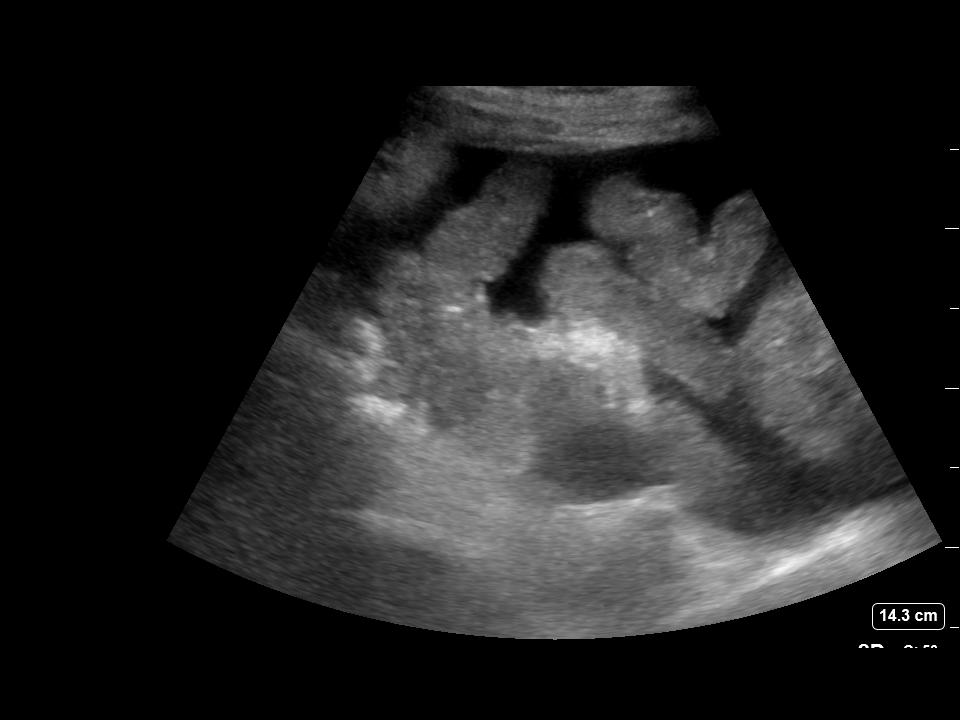
[im 2/4]
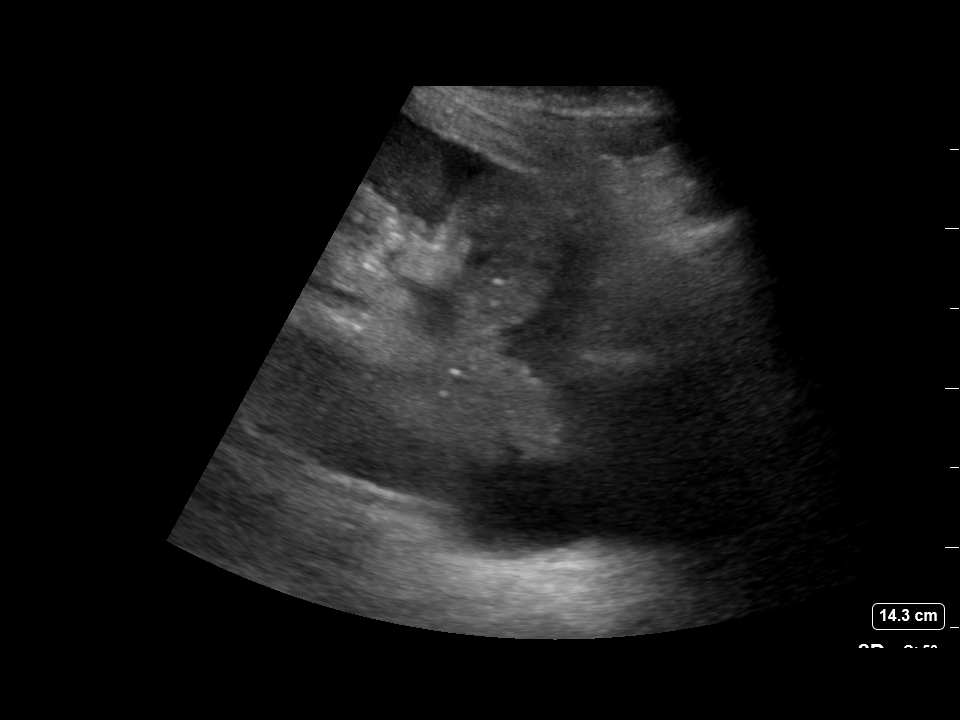
[im 3/4]
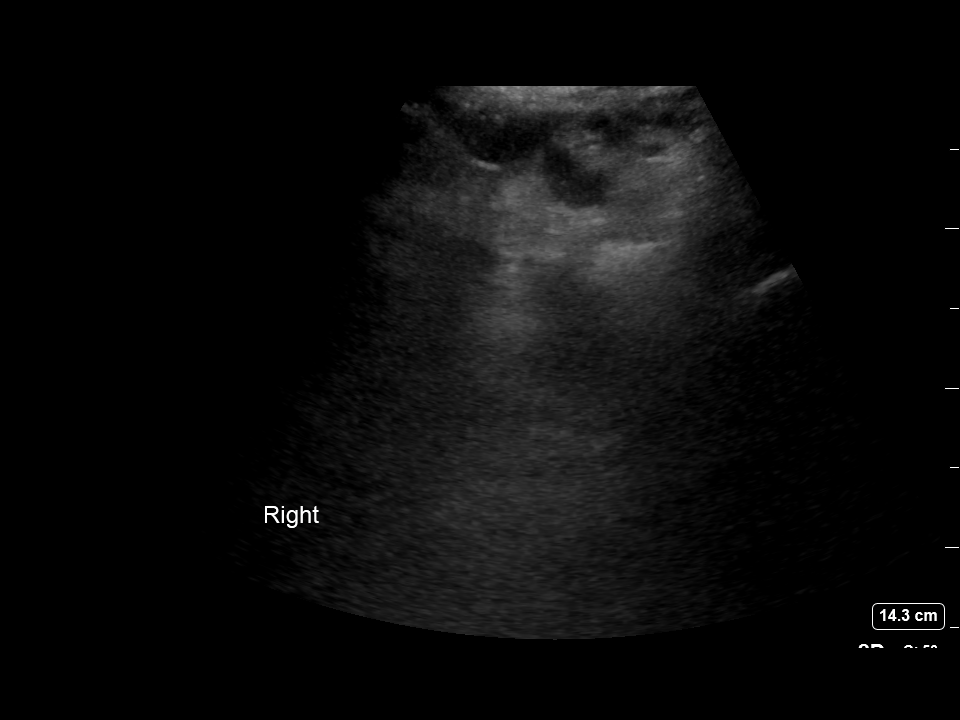
[im 4/4]
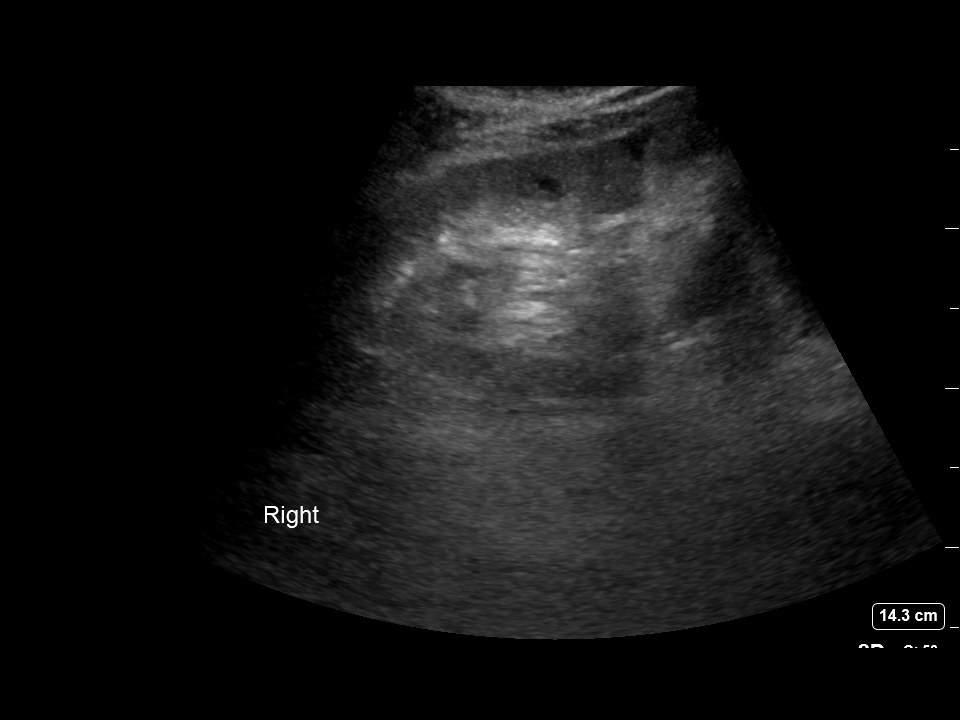

[4 of 4 positions shown; findings below may reference images not displayed]

FINDINGS: Trace ascites present.  No safe window for paracentesis.
IMPRESSION: Trace ascites present.  No safe window for paracentesis.

## 2020-02-05 MED ORDER — LIDOCAINE HCL 1 % IJ SOLN
INTRAMUSCULAR | Status: AC
  Filled 2020-02-05: qty 20

## 2020-02-05 NOTE — Progress Notes (Signed)
IR consulted by Dr. Nori Riis for possible image-guided paracentesis.  Limited abdominal US revealed trace amounts of fluid that could not be safely accessed with procedure today (of note patient recently started PO Lasix). Images sent to Dr. Serafina Royals for review. Informed patient that procedure will not occur today. All questions answered and concerns addressed.  Please call IR with questions/concerns.   Bea Graff Sage Hammill, PA-C 02/05/2020, 10:43 AM

## 2020-02-07 ENCOUNTER — Ambulatory Visit (INDEPENDENT_AMBULATORY_CARE_PROVIDER_SITE_OTHER): Payer: No Typology Code available for payment source | Admitting: Family Medicine

## 2020-02-07 ENCOUNTER — Other Ambulatory Visit: Payer: Self-pay

## 2020-02-07 ENCOUNTER — Other Ambulatory Visit: Payer: Self-pay | Admitting: Family Medicine

## 2020-02-07 ENCOUNTER — Encounter: Payer: Self-pay | Admitting: Family Medicine

## 2020-02-07 VITALS — BP 128/58 | Ht 66.0 in | Wt 165.0 lb

## 2020-02-07 DIAGNOSIS — M6259 Muscle wasting and atrophy, not elsewhere classified, multiple sites: Secondary | ICD-10-CM | POA: Diagnosis not present

## 2020-02-07 DIAGNOSIS — K7031 Alcoholic cirrhosis of liver with ascites: Secondary | ICD-10-CM

## 2020-02-07 DIAGNOSIS — F1019 Alcohol abuse with unspecified alcohol-induced disorder: Secondary | ICD-10-CM

## 2020-02-07 DIAGNOSIS — Z658 Other specified problems related to psychosocial circumstances: Secondary | ICD-10-CM | POA: Diagnosis not present

## 2020-02-07 MED ORDER — MIRTAZAPINE 7.5 MG PO TABS: 7.5000 mg | ORAL_TABLET | Freq: Every day | ORAL | 1 refills | Status: DC

## 2020-02-07 MED ORDER — PROPRANOLOL HCL 10 MG PO TABS: 10.0000 mg | ORAL_TABLET | Freq: Two times a day (BID) | ORAL | 3 refills | Status: DC

## 2020-02-07 MED FILL — MIRTAZAPINE 7.5 MG TABLET: 7.5 | 30 days supply | Qty: 30 | Fill #0

## 2020-02-07 MED FILL — PROPRANOLOL HCL 10 MG TAB: 10 | 90 days supply | Qty: 180 | Fill #0

## 2020-02-07 NOTE — Patient Instructions (Addendum)
Things o ask hepatologist: Why belly pain? What will make it better? What will help the muscle loss and fatigue How often EGD?  [facility]

## 2020-02-07 NOTE — Assessment & Plan Note (Signed)
Most likely associated with his end-stage liver disease. I did encourage him to continue to exercise is much as possible, appropriate protein intake and continue the low-salt diet.

## 2020-02-07 NOTE — Assessment & Plan Note (Signed)
We will try him on low-dose Remeron for sleep issues as well as some anxiety and depression. Follow-up 1 month

## 2020-02-07 NOTE — Progress Notes (Signed)
  Joshua Hubbard - 45 y.o. male MRN [id number]  Date of birth: 05-14-1975    SUBJECTIVE:      Chief Complaint:/ HPI:  #1. Follow-up liver cirrhosis with ascites. Continues to have abdominal discomfort. He presented for another paracentesis yesterday and they did not see enough fluid for safe paracentesis window. He is puzzled by this as he feels quite "tight". 2. Biggest problem right now is fatigue. He is only sleeping 2 to 3 hours at night before he wakes up. He then ruminates on some issues and then will get another fitful hour or 2 later. The bupropion did not seem to help this at all and he stopped it. 3. Concerned about loss of muscle mass and weakness. He has noticed this particularly in his chest. He started drinking some Ensure to get higher protein intake. He is trying to follow a very low salt diet. 4. Abstinent from alcohol.    OBJECTIVE: BP (!) 128/58   Ht 5\' 6"  (1.676 m)   Wt 165 lb (74.8 kg)   BMI 26.63 kg/m   Physical Exam:  Vital signs are reviewed. GENERAL: Well-developed male no acute distress. ABDOMEN: Soft, protuberant. Diffusely very mildly tender but no masses are noted. No ascites fluid wave is noted. MSK: He is having some decreased muscle mass particularly in the chest and upper legs and upper arms. Strength is still intact symmetrically. PSYCH: AxOx4. Good eye contact.. No psychomotor retardation or agitation. Appropriate speech fluency and content. Asks and answers questions appropriately. Mood is congruent.   ASSESSMENT & PLAN:  See problem based charting & AVS for pt instructions. No problem-specific Assessment & Plan notes found for this encounter.

## 2020-02-07 NOTE — Assessment & Plan Note (Addendum)
Currently abstinent. He is having financial issues with setting up any type of rehabilitation program at the moment.

## 2020-02-07 NOTE — Assessment & Plan Note (Signed)
We discussed questions he should ask his hepatologist at the next appointment.

## 2020-02-11 MED FILL — GENERLAC SOLUTION 10G/15ML: 10 | 30 days supply | Qty: 2700 | Fill #1

## 2020-02-18 ENCOUNTER — Telehealth: Payer: Self-pay | Admitting: *Deleted

## 2020-02-18 ENCOUNTER — Ambulatory Visit (HOSPITAL_COMMUNITY)
Admission: RE | Admit: 2020-02-18 | Discharge: 2020-02-18 | Disposition: A | Discharge status: Home / Self Care | Payer: No Typology Code available for payment source | Source: Ambulatory Visit | Attending: Family Medicine | Admitting: Family Medicine

## 2020-02-18 DIAGNOSIS — K7031 Alcoholic cirrhosis of liver with ascites: Secondary | ICD-10-CM | POA: Insufficient documentation

## 2020-02-18 HISTORY — PX: IR PARACENTESIS: IMG2679

## 2020-02-18 MED ORDER — LIDOCAINE HCL 1 % IJ SOLN
INTRAMUSCULAR | Status: AC
  Filled 2020-02-18: qty 20

## 2020-02-18 MED ORDER — LIDOCAINE HCL 1 % IJ SOLN
INTRAMUSCULAR | Status: DC | PRN
  Administered 2020-02-18: 10 mL

## 2020-02-18 NOTE — Procedures (Signed)
Ultrasound-guided  therapeutic paracentesis performed yielding 3 liters of yellow fluid. No immediate complications. EBL none.   

## 2020-02-18 NOTE — Telephone Encounter (Signed)
Recived call from Henry @ [facility] ( Dr. Posey Pronto).  Since pt has the focus insurance we would need to call and do a referral.   I contacted [facility] and was told that any procedures done at [facility] will not be covered that all care must be at a [facility] location.  I have LM with Joy informing her.    Will also call to inform patient. Christen Bame, CMA

## 2020-02-21 ENCOUNTER — Other Ambulatory Visit: Payer: Self-pay

## 2020-02-21 ENCOUNTER — Encounter: Payer: Self-pay | Admitting: Family Medicine

## 2020-02-21 ENCOUNTER — Ambulatory Visit (INDEPENDENT_AMBULATORY_CARE_PROVIDER_SITE_OTHER): Payer: No Typology Code available for payment source | Admitting: Family Medicine

## 2020-02-21 ENCOUNTER — Other Ambulatory Visit: Payer: Self-pay | Admitting: Family Medicine

## 2020-02-21 VITALS — Ht 66.0 in | Wt 165.0 lb

## 2020-02-21 DIAGNOSIS — K7031 Alcoholic cirrhosis of liver with ascites: Secondary | ICD-10-CM | POA: Diagnosis not present

## 2020-02-21 DIAGNOSIS — I8511 Secondary esophageal varices with bleeding: Secondary | ICD-10-CM | POA: Diagnosis not present

## 2020-02-21 DIAGNOSIS — Z2839 Other underimmunization status: Secondary | ICD-10-CM | POA: Insufficient documentation

## 2020-02-21 DIAGNOSIS — Z658 Other specified problems related to psychosocial circumstances: Secondary | ICD-10-CM

## 2020-02-21 DIAGNOSIS — Z283 Underimmunization status: Secondary | ICD-10-CM

## 2020-02-21 DIAGNOSIS — M6259 Muscle wasting and atrophy, not elsewhere classified, multiple sites: Secondary | ICD-10-CM

## 2020-02-21 DIAGNOSIS — F1019 Alcohol abuse with unspecified alcohol-induced disorder: Secondary | ICD-10-CM

## 2020-02-21 MED ORDER — FOLIC ACID 1 MG PO TABS: 1.0000 mg | ORAL_TABLET | Freq: Every day | ORAL | 3 refills | Status: DC

## 2020-02-21 MED ORDER — LORAZEPAM 0.5 MG PO TABS: ORAL_TABLET | ORAL | 1 refills | Status: DC

## 2020-02-21 MED FILL — LORazepam 0.5 MG TABS: 0.5 | 30 days supply | Qty: 30 | Fill #0

## 2020-02-21 MED FILL — FOLIC ACID 1 MG TABS: 1 | 90 days supply | Qty: 90 | Fill #0

## 2020-02-21 NOTE — Assessment & Plan Note (Signed)
Currently abstinent.

## 2020-02-21 NOTE — Assessment & Plan Note (Signed)
Saw the hepatologist at [facility].  They are talking about putting him on the transplant list.  He finds this both reassuring that there is a plan in scary that he has to be on the transplant list.  Continues to have intermittent abdominal pain.  Can be severe.  Lasts 30 minutes to several hours.  Does not seem to be brought on by anything specific although it is worse at night when he lays down.  Back to working full-time and is happy with that.    Discussed medications in some refills.  Reviewed his lab work from [facility].  Follow-up 1 month.

## 2020-02-21 NOTE — Progress Notes (Signed)
Joshua Hubbard - 45 y.o. male MRN [id number]  Date of birth: 1975/12/04    SUBJECTIVE:      Chief Complaint:/ HPI:  #1.  Follow-up liver cirrhosis.  He had a paracentesis of 3 L day before yesterday.  He feels much better.  He is also glad that he was able to go 2 weeks in between.  Continues on his medications.  He needs some refills.  Has some questions about Joshua Hubbard. 2.  History of GI bleed: No bloody stool or dark tarry stool.  He also has history of coagulopathy although his last INR was improving.  He said no unusual bleeding with brushing teeth or etc.  No blood in urine.  No unusual bruising. #3.  Social stressors including divorce from his wife, financial issues related to their divorce and his medical costs which are not all covered by his insurance. 4.  Sleep issues: The lorazepam at night is really helping him and said the only thing that really lets him get to sleep at all.  Unfortunately he still waking up multiple times during the night.  I will do a refill of the low-dose lorazepam. 5.  Abstinent from alcohol currently.    OBJECTIVE: Ht 5\' 6"  (1.676 m)   Wt 165 lb (74.8 kg)   BMI 26.63 kg/m   Physical Exam:  Vital signs are reviewed. GENERAL: Well-developed male no acute distress SKIN: No icterus is noted today.  No rash. CV: Regular rate and rhythm ABDOMEN: Protuberant.  No ascites noted.  Diffusely mildly tender. PSYCH: AxOx4. Good eye contact.. No psychomotor retardation or agitation. Appropriate speech fluency and content. Asks and answers questions appropriately. Mood is congruent. Laboratory: From January 24, sodium 130, calcium 8.5, AST 50, ALT 27.  Bilirubin 3.5 significantly improved.  Albumin continues to be relatively stable at 2.6.  Creatinine stable at 0.7.  INR much improved at 1.7.  Platelets improved at 163.  Hemoglobin is 8 which is stable.  RDW is still elevated at 16 but improved fromHigh of 4.08 back in September.  ASSESSMENT & PLAN: Overall Joshua Hubbard is  doing extremely well.  I would like to follow him regularly.  His family is being very supportive.  He is remaining abstinent.  Back to full-time work.  Still actively looking for  alcohol rehabilitation program as financial issues make it difficult for him. Laboratory values are steadily improving and this is reassuring.  I think having a plan such as moving towards the transplant list is a very positive thing for him.  We discussed in detail today spending a total of 35 minutes together. Liver cirrhosis (HCC) Saw the hepatologist at [facility].  They are talking about putting him on the transplant list.  He finds this both reassuring that there is a plan in scary that he has to be on the transplant list.  Continues to have intermittent abdominal pain.  Can be severe.  Lasts 30 minutes to several hours.  Does not seem to be brought on by anything specific although it is worse at night when he lays down.  Back to working full-time and is happy with that.    Discussed medications in some refills.  Reviewed his lab work from [facility].  Follow-up 1 month.  Psychosocial stressors Stress level a little better since he went back to work.  Still trying to work through all of the issues with divorce from his wife, separation from his children, counseling etc.  Secondary esophageal varices with bleeding ([employer]) Has upcoming  repeat EGD where they may band or otherwise treat any new varices.  He had a lot of pain with the last one so is not looking forward to that but he understands the necessity of it.  Atrophy of muscle of multiple sites He has a nutrition consult next week for low-salt diet.  She is also going to focus on appropriate protein intake to help with his sarcopenia.  Alcohol abuse with unspecified alcohol-induced disorder ([place]) Currently abstinent.  Immunizations incomplete Evaluation for transplant list.  Consider him immunosuppressed given his end-stage liver disease.  Per hepatology at [facility], Dr. Posey Pronto  recommended both Prevnar and Pneumovax.  We will set him up for that here.  Nurse visit forI reviewed the note from his hepatologist and they recommend he get both Pneumovax-23 and Prevnar-13   We will set him up for that as a nurse visit.

## 2020-02-21 NOTE — Assessment & Plan Note (Addendum)
Evaluation for transplant list.  Consider him immunosuppressed given his end-stage liver disease.  Per hepatology at [facility], Dr. Posey Pronto recommended both Prevnar and Pneumovax.  We will set him up for that here.  Nurse visit forI reviewed the note from his hepatologist and they recommend he get both Pneumovax-23 and Prevnar-13   We will set him up for that as a nurse visit.

## 2020-02-21 NOTE — Assessment & Plan Note (Signed)
Has upcoming repeat EGD where they may band or otherwise treat any new varices.  He had a lot of pain with the last one so is not looking forward to that but he understands the necessity of it.

## 2020-02-21 NOTE — Assessment & Plan Note (Signed)
Stress level a little better since he went back to work.  Still trying to work through all of the issues with divorce from his wife, separation from his children, counseling etc.

## 2020-02-21 NOTE — Assessment & Plan Note (Signed)
He has a nutrition consult next week for low-salt diet.  She is also going to focus on appropriate protein intake to help with his sarcopenia.

## 2020-02-26 ENCOUNTER — Ambulatory Visit (INDEPENDENT_AMBULATORY_CARE_PROVIDER_SITE_OTHER): Payer: No Typology Code available for payment source | Admitting: Family Medicine

## 2020-02-26 ENCOUNTER — Other Ambulatory Visit: Payer: Self-pay

## 2020-02-26 DIAGNOSIS — K7031 Alcoholic cirrhosis of liver with ascites: Secondary | ICD-10-CM

## 2020-02-26 NOTE — Patient Instructions (Addendum)
1. Continue to eat 3 real meals and 1-2 snacks per day.    A REAL meal included a source of protein, some carb, and veg and/or fruit.  2. Aim for about 20 grams of protein 4 X day to meet your goal of 80 grams per day.  You want to spread out your protein intake rather than get all or most of it at once.   3. For the next 2 weeks, track your sodium intake every day, recording ALL sodium sources, aiming for <2000 mg/day.  This will help you know the main sources of sodium, and to make better choices.  It's probably wise to not go below 1500 mg per day.    Dietary Recommendations for Liver Cirrhosis - Aim for 4-5 small meals per day, which are less stressful on the liver than larger meals.  - Distribute protein as evenly as possible between eating episodes.   o Your physician from [facility] has recommended 80 grams of protein/day.  Getting adequate protein may help preserve muscle mass, but excessive amounts of protein at one time will be stressful for your liver.  Roughly 7 grams of protein is provided by each of the following: - 1 oz of meat, fish, or chicken - 1 large egg - 1 oz of cheese - 1 oz of milk -  to 2/3 cup of cooked/canned beans - Limit sodium intake to less than 2000 mg per day.   o Read labels to learn which foods are low or high in sodium.  In general:  - HIGH-sodium foods include:  . Processed foods (snack foods, prepared meals, microwavable and "instant" foods) . Most canned foods unless labeled, "no sodium added" or "reduced sodium" . NOTE: "Reduced sodium" does not mean "low-sodium."  For example, reduced-sodium soy sauce has less sodium than regular soy sauce, but is still high in this mineral.  . Frozen dinners . Most breads and many baked goods, including some that are sweet.  (Read the label!) . Many ready-to-eat breakfast cereals.   . Condiments such as mustard, salad dressings, ketchup, pre-made sauces.  . Cheese.  . Processed meat such as bacon and sausage.   - Unless  you are tracking sodium intake, and know you are well under 2000 mg/day from food sources, discontinue use of salt in cooking and at the table.  IMPORTANT: Taste preferences are learned, and over time you will become more sensitive to salty taste, and learn to prefer foods less salty.   - As you read labels for sodium content, think twice about any products that provide ?200 mg of sodium per serving.   - For seasoning, experiment with lemon and other citrus, a variety of vinegars, herbs, spices (Beware of spice mixes, which often include salt.)  Follow-up: In-office appointment Thursday, Feb 24 at 1:30 PM.

## 2020-02-26 NOTE — Progress Notes (Signed)
Medical Nutrition Therapy Appt start time: 1400 end time: 1515(75 minutes) PCP: Dorcas Mcmurray, MD Patient has completed 3rd dose of COVID-19 vaccine Feb 2022. Primary concerns today: Diet for liver disease.  Relevant history/background: Joshua Hubbard has been seen by hepatology at [facility] for cirrhosis diagnosed in 2021.  His physician there has recommended an 80-gm/day diet, and he was referred for MNT by his PCP Dorcas Mcmurray, MD for a low-Na diet.    Learning Readiness: Change in progress; has stopped alcohol use completely, and has been trying to make healthy food choices.    Assessment:  Joshua Hubbard was accompanied by his mom Joshua Hubbard today, who frequently helps him out with food preparation.  He has been trying to eat smaller, more frequent meals.  Feels like there is more fluid retention with larger, fewer meals.  Usual eating pattern: 3 meals and 2 snacks per day. Frequent foods and beverages: water, 3 Ensure drinks/day (210 mg Na/8 oz), swt tea ~3 X wk; cereal bars/Cheerios with milk, soup/stew, fruits.   Avoided foods: none.   Usual physical activity: walks 1 mi ~3 X wk.  Sleep:  Erratic; estimates average of 4-5 hrs/night.  Wakes early, and often cannot go back to sleep.    24-hr recall: (Up at 2 AM) B (6 AM)-   2 c coffee, 1 cereal bar Snk ( AM)-   water L (12 PM)-  1 c taco soup (blk, pinto beans, beef), 6-10 tortilla chips, water Snk (3 PM)-  4 No-salt saltines, 2 tbsp peanut butter, water D (6 PM)-  1 sloppy Joe, 2/3 c air-fried potatoes, 1 small pc corn bread, water Snk ( PM)-  1 raspberry cookie Typical day? Yes.    Nutritional Diagnosis:  NB-1.1 Food and nutrition-related knowledge deficit As related to diet for cirrhosis.  As evidenced by patient questions and expressed need for dietary guidance.  Handouts given during visit include:  After-Visit Summary (AVS), which included specific instructions for a diet appropriate for cirrhosis (80 g protein/day and <2000 mg Na).    Demonstrated  degree of understanding via:  Teach Back   Monitoring/Evaluation:  Dietary intake, exercise, and body weight in 3 week(s).

## 2020-03-02 ENCOUNTER — Telehealth: Payer: Self-pay | Admitting: *Deleted

## 2020-03-02 NOTE — Telephone Encounter (Signed)
-----   Message from [name], MD sent at 02/21/2020  2:22 PM EST ----- Team,  I was not specific in my earlier note.  Per [facility] hepatology, we need to give Joshua Hubbard both Pneumovax-23 and Prevnar-13   I think he can have them both at the same time.  He is now considered immunosuppressed and is approaching the liver transplant list this is why we need to do this.  If any questions, give me a call.  He supposed to call for nurse visit  THANKS! Joshua Hubbard

## 2020-03-02 NOTE — Telephone Encounter (Signed)
-----   Message from [name], MD sent at 02/21/2020  8:49 AM EST ----- Per his l iver doctor he needs both pneumonia shots. I told him to call for nurse visit. THANKS!

## 2020-03-10 MED FILL — GENERLAC SOLUTION 10G/15ML: 10 | 30 days supply | Qty: 2700 | Fill #2

## 2020-03-11 ENCOUNTER — Ambulatory Visit (INDEPENDENT_AMBULATORY_CARE_PROVIDER_SITE_OTHER): Payer: No Typology Code available for payment source

## 2020-03-11 ENCOUNTER — Other Ambulatory Visit: Payer: Self-pay

## 2020-03-11 DIAGNOSIS — Z23 Encounter for immunization: Secondary | ICD-10-CM | POA: Diagnosis not present

## 2020-03-11 NOTE — Telephone Encounter (Signed)
Pt in office today for 1st pneumococcal will call in 8 weeks for next dose.Francesco Provencal Zimmerman Rumple, CMA

## 2020-03-11 NOTE — Progress Notes (Signed)
Patient presents to nurse clinic for pneumonia vaccine. Precepted with Dr. Owens Shark regarding which vaccine should be administered first and time frame between vaccines. Verified with CDC that patient should receive Prevnar first, then return in 8 weeks for Pneumovax vaccine.   Administered Prevnar 13 in LD today. Site unremarkable, tolerated injection well. Patient will return to nurse clinic for Pneumovax injection in 8 weeks.   Talbot Grumbling, RN

## 2020-03-12 ENCOUNTER — Other Ambulatory Visit: Payer: Self-pay | Admitting: Family Medicine

## 2020-03-12 ENCOUNTER — Ambulatory Visit (HOSPITAL_COMMUNITY)
Admission: RE | Admit: 2020-03-12 | Discharge: 2020-03-12 | Disposition: A | Discharge status: Home / Self Care | Payer: No Typology Code available for payment source | Source: Ambulatory Visit | Attending: Family Medicine | Admitting: Family Medicine

## 2020-03-12 ENCOUNTER — Other Ambulatory Visit (INDEPENDENT_AMBULATORY_CARE_PROVIDER_SITE_OTHER): Payer: No Typology Code available for payment source | Admitting: Family Medicine

## 2020-03-12 DIAGNOSIS — K7031 Alcoholic cirrhosis of liver with ascites: Secondary | ICD-10-CM | POA: Insufficient documentation

## 2020-03-12 LAB — POCT HEMOGLOBIN: Hemoglobin: 7.5 g/dL — AB (ref 11–14.6)

## 2020-03-12 IMAGING — US IR ABDOMEN US LIMITED
1 series · 2 of 2 positions shown · non-contrast
Comparison: [DATE]

CLINICAL DATA: Abdominal distension. Interventional radiology
consulted for paracentesis.

EXAM:
LIMITED ABDOMEN ULTRASOUND FOR ASCITES
TECHNIQUE: Limited ultrasound survey for ascites was performed in all four
abdominal quadrants.

[Series 1: ir (id) (id)/(id)/(id) ir · 2 of 2 slices shown]
[im 1/2]
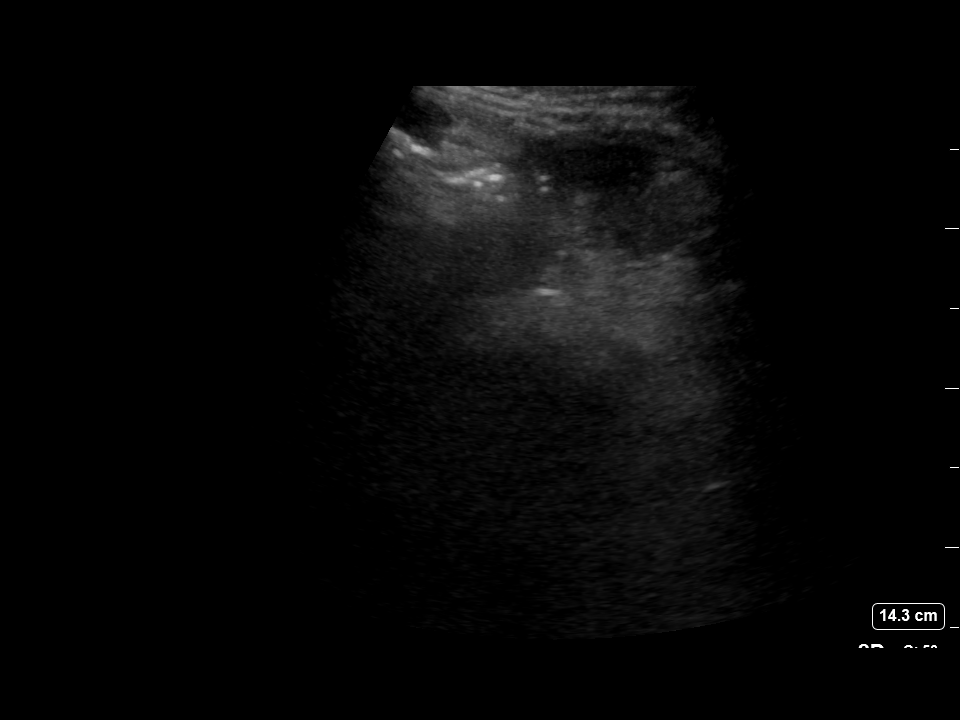
[im 2/2]
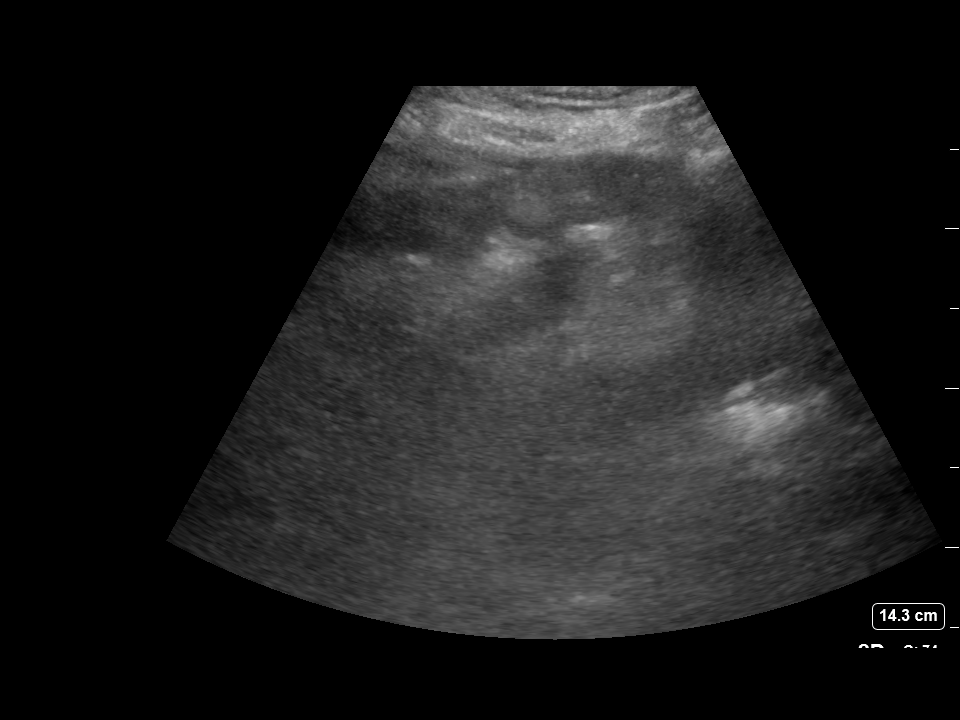

[2 of 2 positions shown; findings below may reference images not displayed]

FINDINGS: No significant ascites.
IMPRESSION: No significant ascites.  Unable to perform paracentesis.

## 2020-03-12 MED ORDER — LIDOCAINE HCL 1 % IJ SOLN
INTRAMUSCULAR | Status: AC
  Filled 2020-03-12: qty 20

## 2020-03-12 NOTE — Progress Notes (Signed)
Interventional Radiology Brief Note:  Patient presented to [facility] today for possible paracentesis.  Limited US Abdomen shows on fluid accumulation.  No procedure performed. Patient discharged home.   Brynda Greathouse, MS RD PA-C 9:22 AM

## 2020-03-12 NOTE — Addendum Note (Signed)
Addended by: Aurther Loft on: 03/12/2020 03:39 PM   Modules accepted: Orders

## 2020-03-13 LAB — COMPREHENSIVE METABOLIC PANEL
ALT: 28 IU/L (ref 0–44)
AST: 48 IU/L — ABNORMAL HIGH (ref 0–40)
Albumin/Globulin Ratio: 0.7 — ABNORMAL LOW (ref 1.2–2.2)
Albumin: 3.1 g/dL — ABNORMAL LOW (ref 4.0–5.0)
Alkaline Phosphatase: 105 IU/L (ref 44–121)
BUN/Creatinine Ratio: 10 (ref 9–20)
BUN: 7 mg/dL (ref 6–24)
Bilirubin Total: 3.2 mg/dL — ABNORMAL HIGH (ref 0.0–1.2)
CO2: 21 mmol/L (ref 20–29)
Calcium: 8.3 mg/dL — ABNORMAL LOW (ref 8.7–10.2)
Chloride: 96 mmol/L (ref 96–106)
Creatinine, Ser: 0.72 mg/dL — ABNORMAL LOW (ref 0.76–1.27)
GFR calc Af Amer: 130 mL/min/{1.73_m2} (ref >59)
GFR calc non Af Amer: 113 mL/min/{1.73_m2} (ref >59)
Globulin, Total: 4.3 g/dL (ref 1.5–4.5)
Glucose: 71 mg/dL (ref 65–99)
Potassium: 4.1 mmol/L (ref 3.5–5.2)
Sodium: 130 mmol/L — ABNORMAL LOW (ref 134–144)
Total Protein: 7.4 g/dL (ref 6.0–8.5)

## 2020-03-13 LAB — PROTIME-INR
INR: 1.4 — ABNORMAL HIGH (ref 0.9–1.2)
Prothrombin Time: 14.8 s — ABNORMAL HIGH (ref 9.1–12.0)

## 2020-03-13 LAB — CBC
Hematocrit: 24 % — ABNORMAL LOW (ref 37.5–51.0)
Hemoglobin: 7.9 g/dL — ABNORMAL LOW (ref 13.0–17.7)
MCH: 26 pg — ABNORMAL LOW (ref 26.6–33.0)
MCHC: 32.9 g/dL (ref 31.5–35.7)
MCV: 79 fL (ref 79–97)
Platelets: 166 10*3/uL (ref 150–450)
RBC: 3.04 x10E6/uL — ABNORMAL LOW (ref 4.14–5.80)
RDW: 15.1 % (ref 11.6–15.4)
WBC: 7.1 10*3/uL (ref 3.4–10.8)

## 2020-03-13 NOTE — Progress Notes (Signed)
Notified by text. All labs are actually looking improved except hgb is down a couple of tenths of a point. Recheck hgb in 2-3 weeks.

## 2020-03-19 ENCOUNTER — Ambulatory Visit: Payer: No Typology Code available for payment source | Admitting: Family Medicine

## 2020-03-20 ENCOUNTER — Other Ambulatory Visit: Payer: Self-pay

## 2020-03-20 ENCOUNTER — Other Ambulatory Visit: Payer: Self-pay | Admitting: Family Medicine

## 2020-03-20 ENCOUNTER — Ambulatory Visit (INDEPENDENT_AMBULATORY_CARE_PROVIDER_SITE_OTHER): Payer: No Typology Code available for payment source | Admitting: Family Medicine

## 2020-03-20 DIAGNOSIS — D689 Coagulation defect, unspecified: Secondary | ICD-10-CM | POA: Diagnosis not present

## 2020-03-20 DIAGNOSIS — K7031 Alcoholic cirrhosis of liver with ascites: Secondary | ICD-10-CM

## 2020-03-20 DIAGNOSIS — F1019 Alcohol abuse with unspecified alcohol-induced disorder: Secondary | ICD-10-CM | POA: Diagnosis not present

## 2020-03-20 DIAGNOSIS — Z658 Other specified problems related to psychosocial circumstances: Secondary | ICD-10-CM

## 2020-03-20 DIAGNOSIS — K769 Liver disease, unspecified: Secondary | ICD-10-CM

## 2020-03-20 DIAGNOSIS — Z9189 Other specified personal risk factors, not elsewhere classified: Secondary | ICD-10-CM

## 2020-03-20 DIAGNOSIS — I8511 Secondary esophageal varices with bleeding: Secondary | ICD-10-CM

## 2020-03-20 MED ORDER — MIRTAZAPINE 15 MG PO TABS: 15.0000 mg | ORAL_TABLET | Freq: Every day | ORAL | 3 refills | Status: DC

## 2020-03-20 MED FILL — MIRTAZAPINE 15 MG TABLET: 15 | 30 days supply | Qty: 30 | Fill #0

## 2020-03-22 NOTE — Assessment & Plan Note (Signed)
Following with GI Will see if he can have next EGD at [facility] as it would be beneficial financially and more convenient He is to check with [facility] regarding this and let me know

## 2020-03-22 NOTE — Assessment & Plan Note (Signed)
Needs f/u MRI and he is to check with [facility] hepatologist to see if we can have I it done within [facility] sytem for convenience and finacial reasons.

## 2020-03-22 NOTE — Assessment & Plan Note (Signed)
Gradual improvement of INR

## 2020-03-22 NOTE — Assessment & Plan Note (Signed)
abstinent

## 2020-03-22 NOTE — Assessment & Plan Note (Signed)
Stable. Continue meds and increase remeron to 30 mg qhs. F/u 1 m.

## 2020-03-22 NOTE — Assessment & Plan Note (Signed)
Stable at this time with some improvement in clinical indicators. contiue current meds

## 2020-03-22 NOTE — Progress Notes (Signed)
    CHIEF COMPLAINT / HPI: 1. Sleep: better sleep initiation with the addition remeron but still early awakening 2. Stressors stable 3. Saw hepatologist: they decided to forgo putting him on LT list for now as some of his clinical indicators stabilixzing. 4. Ascites: significant improvement with decrease in need for paracentesis (frequency) 5. Abstinent from alcohol  6. Abdominal pain is still an intermittent issue--severe when it occurs. Frequency has decreased some. 6. No bleeding   PERTINENT  PMH / PSH: I have reviewed the patient's medications, allergies, past medical and surgical history, smoking status and updated in the EMR as appropriate.   OBJECTIVE:  BP (!) 114/56   Ht 5\' 6"  (1.676 m)   Wt 165 lb (74.8 kg)   BMI 26.63 kg/m  Vital signs reviewed. GENERAL: Well-developed, well-nourished, no acute distress. CARDIOVASCULAR: Regular rate and rhythm no murmur gallop or rub LUNGS: Clear to auscultation bilaterally, no rales or wheeze. ABDOMEN:distended and firm but no clinical ascites (no fluid wave). No stigmata. Firm but not specifically tender and no distinct masses. NEURO: No gross focal neurological deficits. MSK: Movement of extremity x 4. PSYCH: AxOx4. Good eye contact.. No psychomotor retardation or agitation. Appropriate speech fluency and content. Asks and answers questions appropriately. Mood is congruent.     ASSESSMENT / PLAN:   Liver cirrhosis ([place]) Stable at this time with some improvement in clinical indicators. contiue current meds  Psychosocial stressors Stable. Continue meds and increase remeron to 30 mg qhs. F/u 1 m.  Alcohol abuse with unspecified alcohol-induced disorder ([facility]) abstinent  Coagulopathy ([place]) Gradual improvement of INR  Secondary esophageal varices with bleeding ([place]) Following with GI Will see if he can have next EGD at [facility] as it would be beneficial financially and more convenient He is to check with [facility] regarding this and  let me know  Lesion of liver greater than 1 cm in diameter with liver disease conferring risk of hepatocellular carcinoma Needs f/u MRI and he is to check with [facility] hepatologist to see if we can have I it done within [facility] for convenience and finacial reasons.   Dorcas Mcmurray MD

## 2020-03-23 MED FILL — LORazepam 0.5 MG TABS: 0.5 | 30 days supply | Qty: 30 | Fill #1

## 2020-04-07 ENCOUNTER — Telehealth: Payer: Self-pay

## 2020-04-07 DIAGNOSIS — F1019 Alcohol abuse with unspecified alcohol-induced disorder: Secondary | ICD-10-CM

## 2020-04-07 DIAGNOSIS — K7031 Alcoholic cirrhosis of liver with ascites: Secondary | ICD-10-CM

## 2020-04-07 NOTE — Telephone Encounter (Signed)
Received phone call from [place] at [facility] regarding patient's upcoming ultrasound. Peggy reports that she needs a new order placed and to leave out the standing count number. Vickii Chafe states that imaging will not be able to be completed until this has been resolved.   Please advise.   Talbot Grumbling, RN

## 2020-04-08 NOTE — Telephone Encounter (Signed)
Peggy called back with IR and states that patient is scheduled for tomorrow and order still needs to be fixed.  Patient will need a new IR order placed each time, this is needed.  New order placed and will have provider cosign.  Jazmin Hartsell,CMA

## 2020-04-09 ENCOUNTER — Ambulatory Visit (HOSPITAL_COMMUNITY)
Admission: RE | Admit: 2020-04-09 | Discharge: 2020-04-09 | Disposition: A | Discharge status: Home / Self Care | Payer: No Typology Code available for payment source | Source: Ambulatory Visit | Attending: Family Medicine | Admitting: Family Medicine

## 2020-04-09 ENCOUNTER — Other Ambulatory Visit: Payer: Self-pay

## 2020-04-09 DIAGNOSIS — F1019 Alcohol abuse with unspecified alcohol-induced disorder: Secondary | ICD-10-CM | POA: Insufficient documentation

## 2020-04-09 DIAGNOSIS — K7031 Alcoholic cirrhosis of liver with ascites: Secondary | ICD-10-CM | POA: Insufficient documentation

## 2020-04-09 IMAGING — US IR ABDOMEN US LIMITED
1 series · 2 of 2 positions shown · non-contrast
Comparison: [DATE]

CLINICAL DATA: Abdominal distension. Evaluate for ascites and
paracentesis.

EXAM:
LIMITED ABDOMEN ULTRASOUND FOR ASCITES
TECHNIQUE: Limited ultrasound survey for ascites was performed.

[Series 1: ir (id) (id)/(id)/(id) ir · 2 of 2 slices shown]
[im 1/2]
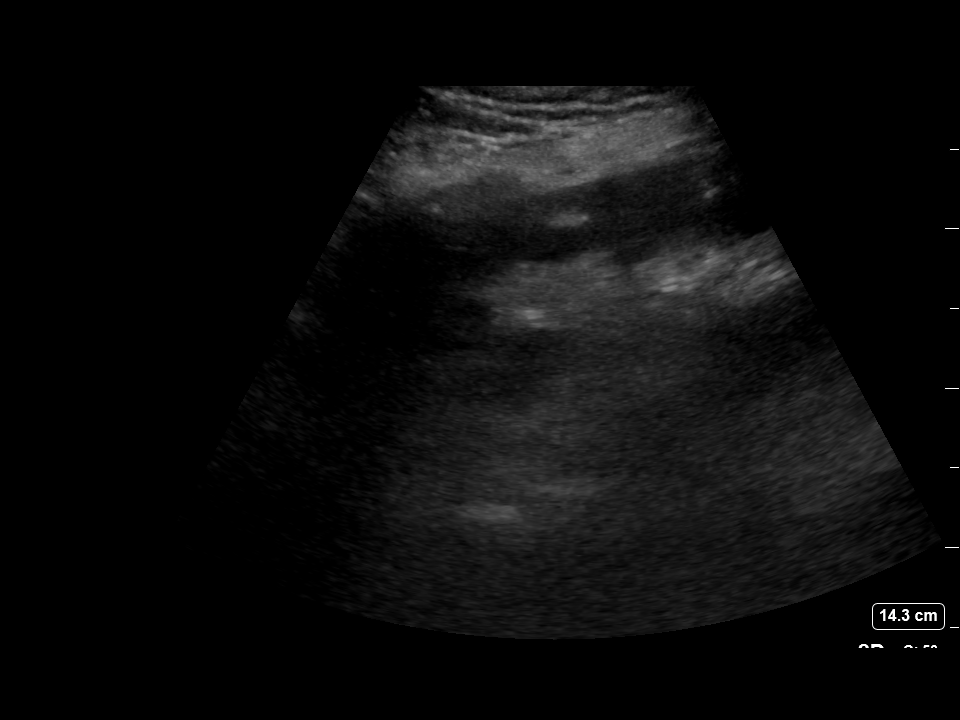
[im 2/2]
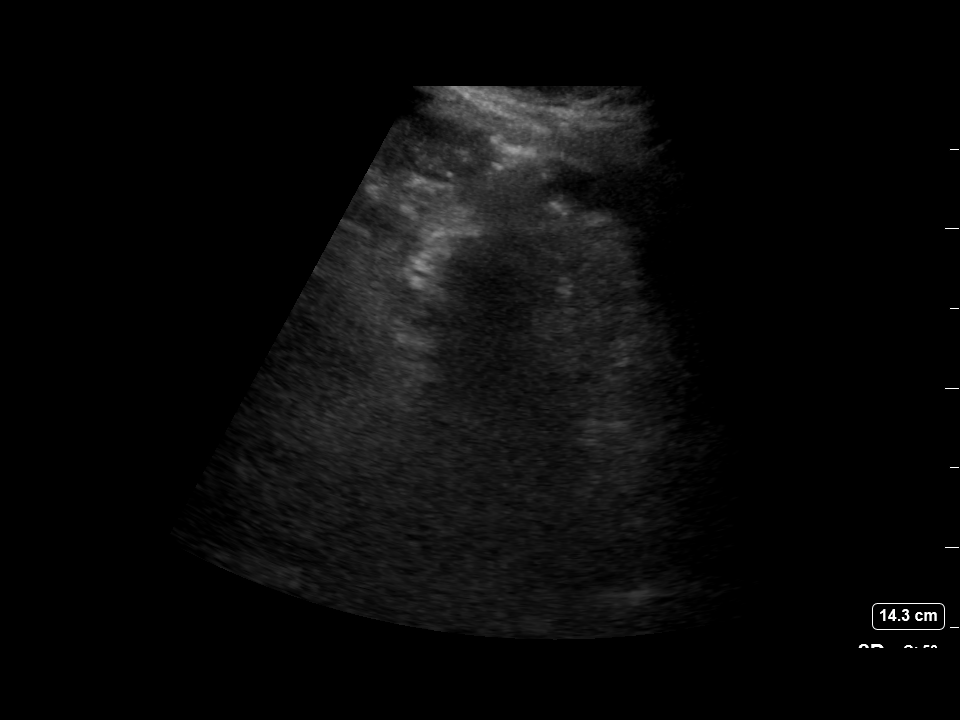

[2 of 2 positions shown; findings below may reference images not displayed]

FINDINGS: No significant ascites.
IMPRESSION: No significant ascites.  Paracentesis not performed.

## 2020-04-09 MED ORDER — LIDOCAINE HCL 1 % IJ SOLN
INTRAMUSCULAR | Status: AC
  Filled 2020-04-09: qty 20

## 2020-04-09 NOTE — Progress Notes (Signed)
Patient presented for possible paracentesis today. Limited US Abdomen showed no fluid reaccumulation.  No procedure performed.   Brynda Greathouse, MS RD PA-C 1:27 PM

## 2020-04-10 ENCOUNTER — Other Ambulatory Visit: Payer: Self-pay | Admitting: Family Medicine

## 2020-04-10 ENCOUNTER — Ambulatory Visit (INDEPENDENT_AMBULATORY_CARE_PROVIDER_SITE_OTHER): Payer: No Typology Code available for payment source | Admitting: Family Medicine

## 2020-04-10 VITALS — Ht 66.0 in | Wt 168.0 lb

## 2020-04-10 DIAGNOSIS — K7031 Alcoholic cirrhosis of liver with ascites: Secondary | ICD-10-CM | POA: Diagnosis not present

## 2020-04-10 DIAGNOSIS — Z658 Other specified problems related to psychosocial circumstances: Secondary | ICD-10-CM | POA: Diagnosis not present

## 2020-04-10 MED ORDER — FUROSEMIDE 20 MG PO TABS: 20.0000 mg | ORAL_TABLET | Freq: Every day | ORAL | 3 refills | Status: DC

## 2020-04-10 NOTE — Progress Notes (Signed)
  Joshua Hubbard - 45 y.o. male MRN [id number]  Date of birth: 07-28-1975    SUBJECTIVE:      Chief Complaint:/ HPI:  F/u cirrhosis Doing some better Seeing counselor about 1x per week and feels comfortable with that His abdominal pain has improved some Still asbtinent Stressors at home have been increased with financial issues     OBJECTIVE: Ht 5\' 6"  (1.676 m)   Wt 168 lb (76.2 kg)   BMI 27.12 kg/m   Physical Exam:  Vital signs are reviewed. Vital signs reviewed. GENERAL: Well-developed, well-nourished, no acute distress. CARDIOVASCULAR: Regular rate and rhythm no murmur gallop or rub LUNGS: Clear to auscultation bilaterally, no rales or wheeze. ABDOMEN: Soft positive bowel sounds NEURO: No gross focal neurological deficits. MSK: Movement of extremity x 4.    ASSESSMENT & PLAN:  See problem based charting & AVS for pt instructions. No problem-specific Assessment & Plan notes found for this encounter.

## 2020-04-10 NOTE — Assessment & Plan Note (Signed)
Recheck labs He wasn't to  Try and decrease acomprosate to 1 tab TID. If hse starts to have cravings, will go back to higher dose

## 2020-04-10 NOTE — Assessment & Plan Note (Signed)
Discussed The remeron made him feel a little odd, as did the SSRI. Usually started after he was on it for a few days so he stopped. Does not want to try anytghing additional at this time. Feels more or less stable mood wise. F/u 1 m

## 2020-04-13 ENCOUNTER — Other Ambulatory Visit (INDEPENDENT_AMBULATORY_CARE_PROVIDER_SITE_OTHER): Payer: No Typology Code available for payment source

## 2020-04-13 ENCOUNTER — Other Ambulatory Visit: Payer: Self-pay

## 2020-04-13 DIAGNOSIS — K7031 Alcoholic cirrhosis of liver with ascites: Secondary | ICD-10-CM

## 2020-04-13 MED FILL — SPIRONOLACTONE 50 MG TABS: 50 | 89 days supply | Qty: 89 | Fill #1

## 2020-04-14 ENCOUNTER — Encounter: Payer: Self-pay | Admitting: Family Medicine

## 2020-04-14 ENCOUNTER — Other Ambulatory Visit: Payer: Self-pay | Admitting: Family Medicine

## 2020-04-14 DIAGNOSIS — K921 Melena: Secondary | ICD-10-CM

## 2020-04-14 LAB — CBC
Hematocrit: 23 % — ABNORMAL LOW (ref 37.5–51.0)
Hemoglobin: 7.6 g/dL — ABNORMAL LOW (ref 13.0–17.7)
MCH: 24.7 pg — ABNORMAL LOW (ref 26.6–33.0)
MCHC: 33 g/dL (ref 31.5–35.7)
MCV: 75 fL — ABNORMAL LOW (ref 79–97)
Platelets: 138 10*3/uL — ABNORMAL LOW (ref 150–450)
RBC: 3.08 x10E6/uL — ABNORMAL LOW (ref 4.14–5.80)
RDW: 15.1 % (ref 11.6–15.4)
WBC: 4.1 10*3/uL (ref 3.4–10.8)

## 2020-04-14 LAB — COMPREHENSIVE METABOLIC PANEL
ALT: 28 IU/L (ref 0–44)
AST: 48 IU/L — ABNORMAL HIGH (ref 0–40)
Albumin/Globulin Ratio: 0.7 — ABNORMAL LOW (ref 1.2–2.2)
Albumin: 3.2 g/dL — ABNORMAL LOW (ref 4.0–5.0)
Alkaline Phosphatase: 109 IU/L (ref 44–121)
BUN/Creatinine Ratio: 11 (ref 9–20)
BUN: 8 mg/dL (ref 6–24)
Bilirubin Total: 2.1 mg/dL — ABNORMAL HIGH (ref 0.0–1.2)
CO2: 20 mmol/L (ref 20–29)
Calcium: 8.7 mg/dL (ref 8.7–10.2)
Chloride: 97 mmol/L (ref 96–106)
Creatinine, Ser: 0.71 mg/dL — ABNORMAL LOW (ref 0.76–1.27)
Globulin, Total: 4.4 g/dL (ref 1.5–4.5)
Glucose: 99 mg/dL (ref 65–99)
Potassium: 4.1 mmol/L (ref 3.5–5.2)
Sodium: 132 mmol/L — ABNORMAL LOW (ref 134–144)
Total Protein: 7.6 g/dL (ref 6.0–8.5)
eGFR: 115 mL/min/{1.73_m2} (ref >59)

## 2020-04-14 LAB — POCT INR: INR: 1.5 — AB (ref 2.0–3.0)

## 2020-04-17 MED FILL — GENERLAC SOLUTION 10G/15ML: 10 | 30 days supply | Qty: 2700 | Fill #3

## 2020-04-21 MED FILL — LORazepam 0.5 MG TABS: 0.5 | 30 days supply | Qty: 30 | Fill #0

## 2020-04-21 MED FILL — PANTOPRAZOLE SOD DR 40 MG T: 40 | 90 days supply | Qty: 180 | Fill #1

## 2020-04-23 ENCOUNTER — Telehealth: Payer: Self-pay

## 2020-04-23 NOTE — Telephone Encounter (Signed)
Hey Dr Loletha Carrow, this pt is wishing to establish care with [place] as his new GI since we are closer to his home and he works for [employer], last seen 01/2020, records are in epic for review, please advise on scheduling

## 2020-04-24 NOTE — Telephone Encounter (Signed)
I will see him at my next new patient appointment.  As this patient has a complex liver condition and my next appointment is likely to be over a month away, he must continue care with [facility] until that time, especially for urgent matters.  - HD

## 2020-04-25 ENCOUNTER — Other Ambulatory Visit (HOSPITAL_COMMUNITY): Payer: Self-pay

## 2020-04-30 ENCOUNTER — Other Ambulatory Visit: Payer: Self-pay | Admitting: Family Medicine

## 2020-04-30 ENCOUNTER — Other Ambulatory Visit: Payer: No Typology Code available for payment source

## 2020-04-30 ENCOUNTER — Other Ambulatory Visit: Payer: Self-pay

## 2020-04-30 DIAGNOSIS — K7031 Alcoholic cirrhosis of liver with ascites: Secondary | ICD-10-CM

## 2020-04-30 DIAGNOSIS — K921 Melena: Secondary | ICD-10-CM

## 2020-05-01 ENCOUNTER — Ambulatory Visit (INDEPENDENT_AMBULATORY_CARE_PROVIDER_SITE_OTHER): Payer: No Typology Code available for payment source | Admitting: Family Medicine

## 2020-05-01 ENCOUNTER — Encounter: Payer: Self-pay | Admitting: Family Medicine

## 2020-05-01 DIAGNOSIS — F1019 Alcohol abuse with unspecified alcohol-induced disorder: Secondary | ICD-10-CM

## 2020-05-01 DIAGNOSIS — K922 Gastrointestinal hemorrhage, unspecified: Secondary | ICD-10-CM

## 2020-05-01 DIAGNOSIS — K7031 Alcoholic cirrhosis of liver with ascites: Secondary | ICD-10-CM | POA: Diagnosis not present

## 2020-05-01 DIAGNOSIS — Z658 Other specified problems related to psychosocial circumstances: Secondary | ICD-10-CM | POA: Diagnosis not present

## 2020-05-01 LAB — CBC
Hematocrit: 22.6 % — ABNORMAL LOW (ref 37.5–51.0)
Hemoglobin: 7.3 g/dL — ABNORMAL LOW (ref 13.0–17.7)
MCH: 23.6 pg — ABNORMAL LOW (ref 26.6–33.0)
MCHC: 32.3 g/dL (ref 31.5–35.7)
MCV: 73 fL — ABNORMAL LOW (ref 79–97)
Platelets: 115 10*3/uL — ABNORMAL LOW (ref 150–450)
RBC: 3.09 x10E6/uL — ABNORMAL LOW (ref 4.14–5.80)
RDW: 14.4 % (ref 11.6–15.4)
WBC: 3.1 10*3/uL — ABNORMAL LOW (ref 3.4–10.8)

## 2020-05-01 LAB — COMPREHENSIVE METABOLIC PANEL
ALT: 27 IU/L (ref 0–44)
AST: 40 IU/L (ref 0–40)
Albumin/Globulin Ratio: 0.9 — ABNORMAL LOW (ref 1.2–2.2)
Albumin: 3.3 g/dL — ABNORMAL LOW (ref 4.0–5.0)
Alkaline Phosphatase: 108 IU/L (ref 44–121)
BUN/Creatinine Ratio: 7 — ABNORMAL LOW (ref 9–20)
BUN: 5 mg/dL — ABNORMAL LOW (ref 6–24)
Bilirubin Total: 1.8 mg/dL — ABNORMAL HIGH (ref 0.0–1.2)
CO2: 19 mmol/L — ABNORMAL LOW (ref 20–29)
Calcium: 8.5 mg/dL — ABNORMAL LOW (ref 8.7–10.2)
Chloride: 103 mmol/L (ref 96–106)
Creatinine, Ser: 0.67 mg/dL — ABNORMAL LOW (ref 0.76–1.27)
Globulin, Total: 3.8 g/dL (ref 1.5–4.5)
Glucose: 126 mg/dL — ABNORMAL HIGH (ref 65–99)
Potassium: 3.6 mmol/L (ref 3.5–5.2)
Sodium: 138 mmol/L (ref 134–144)
Total Protein: 7.1 g/dL (ref 6.0–8.5)
eGFR: 117 mL/min/{1.73_m2} (ref >59)

## 2020-05-01 NOTE — Assessment & Plan Note (Signed)
Remains abstinent.  Continue acamprosate and outpatient counseling.

## 2020-05-01 NOTE — Assessment & Plan Note (Signed)
I congratulated him on all of the progress he is made.  We will continue current medication regimen including the low-dose benzodiazepine.  He will continue in outpatient therapy.  I will recommend he spend at least 30 minutes a day doing something that he enjoys such as woodworking, working outside Social research officer, government.  I think this will overall help him sleep and help his stress level.

## 2020-05-01 NOTE — Assessment & Plan Note (Signed)
His hemoglobin is 7.3 now.  He is isolated around her baseline of about 7.5-8.0.  This is the lowest he has been in a while so I want to recheck it in 2 weeks.  This could contribute to his fatigue but I think a bigger part of that is the actual cirrhosis itself compounded with the stressors.

## 2020-05-01 NOTE — Progress Notes (Signed)
    CHIEF COMPLAINT / HPI: .  Follow-up repeat blood work.  His main problems continues to be fatigue, generalized itching, difficulty sleeping and some depressive symptoms. #1.  Difficulty sleeping and fatigue: Still has trouble staying asleep at night.  With use of the benzodiazepine use had some improvement in initiating sleep.  He wakes up worrying about things. 2.  Continues in outpatient therapy.  He continues to work through his issues and stressors.  Denies any alcohol intake and denies any cravings.  Continues on acamprosate but he has decreased his dose a little bit and has not seen any change. 3.  Generalized skin itching: Worse at sometimes than others.  Topical emollients are of little help. 4.  Breast tenderness: Wonders if one of his medicines is making this worse.  Very aggravating.  No nipple discharge.  PERTINENT  PMH / PSH: I have reviewed the patient's medications, allergies, past medical and surgical history, smoking status and updated in the EMR as appropriate.   OBJECTIVE:  BP (!) 110/58   Ht 5\' 6"  (1.676 m)   BMI 27.12 kg/m  Vital signs reviewed. GENERAL: Well-developed, well-nourished, no acute distress. CARDIOVASCULAR: Regular rate and rhythm  LUNGS: Normal work of breathing ABDOMEN: Soft positive bowel sounds.  Improvement in his ascites NEURO: No gross focal neurological deficits. MSK: Movement of extremity x 4. PSYCH: AxOx4. Good eye contact.. No psychomotor retardation or agitation. Appropriate speech fluency and content. Asks and answers questions appropriately. Mood is congruent.     ASSESSMENT / PLAN:   Liver cirrhosis ([place]) Certainly the skin itching and breast tenderness are related to cirrhosis and probably compounded by spironolactone.  We will cut his spironolactone in half.  Know what she is abdominal ascites if any starts to reaccumulate, will need to go back up on that.  We will continue his current dose of Lasix.  He is due a repeat MRI and  wants to check with his hepatologist and see if it can be done in this system rather than at [facility] as it would be cheaper here.  I would be happy to order it if they specify exactly what they want.  Alcohol abuse with unspecified alcohol-induced disorder ([place]) Remains abstinent.  Continue acamprosate and outpatient counseling.  Hx of  Upper GI bleed His hemoglobin is 7.3 now.  He is isolated around her baseline of about 7.5-8.0.  This is the lowest he has been in a while so I want to recheck it in 2 weeks.  This could contribute to his fatigue but I think a bigger part of that is the actual cirrhosis itself compounded with the stressors.  Psychosocial stressors I congratulated him on all of the progress he is made.  We will continue current medication regimen including the low-dose benzodiazepine.  He will continue in outpatient therapy.  I will recommend he spend at least 30 minutes a day doing something that he enjoys such as woodworking, working outside Social research officer, government.  I think this will overall help him sleep and help his stress level.   Dorcas Mcmurray MD

## 2020-05-01 NOTE — Assessment & Plan Note (Signed)
Certainly the skin itching and breast tenderness are related to cirrhosis and probably compounded by spironolactone.  We will cut his spironolactone in half.  Know what she is abdominal ascites if any starts to reaccumulate, will need to go back up on that.  We will continue his current dose of Lasix.  He is due a repeat MRI and wants to check with his hepatologist and see if it can be done in this system rather than at [facility] as it would be cheaper here.  I would be happy to order it if they specify exactly what they want.

## 2020-05-03 MED FILL — Acamprosate Calcium Tab Delayed Release 333 MG: ORAL | 30 days supply | Qty: 180 | Fill #0 | Status: AC

## 2020-05-04 ENCOUNTER — Other Ambulatory Visit (HOSPITAL_COMMUNITY): Payer: Self-pay

## 2020-05-05 ENCOUNTER — Other Ambulatory Visit: Payer: Self-pay | Admitting: Family Medicine

## 2020-05-05 MED ORDER — GABAPENTIN 600 MG PO TABS: ORAL_TABLET | ORAL | 1 refills | Status: DC

## 2020-05-05 NOTE — Telephone Encounter (Addendum)
Call from pt Return of episodic abdominal pain. Has not been this bad for a while. Botheing him last 2 days. Was going to try tramadol but he said it made him "hot and shakey" when he had it after his ACL surgery. I would like to avoid narcotics as much as possible. Will try gabapentin. His formulary only approves 600 and 800 mg tabs. Dorcas Mcmurray

## 2020-05-06 ENCOUNTER — Ambulatory Visit: Payer: No Typology Code available for payment source

## 2020-05-11 ENCOUNTER — Other Ambulatory Visit (HOSPITAL_COMMUNITY): Payer: Self-pay

## 2020-05-11 MED FILL — Folic Acid Tab 1 MG: ORAL | 90 days supply | Qty: 90 | Fill #0 | Status: CN

## 2020-05-11 MED FILL — Propranolol HCl Tab 10 MG: ORAL | 90 days supply | Qty: 180 | Fill #0 | Status: CN

## 2020-05-19 ENCOUNTER — Other Ambulatory Visit (HOSPITAL_COMMUNITY): Payer: Self-pay

## 2020-05-19 NOTE — Telephone Encounter (Signed)
Unable to leave msg on pt's vm (busy tone)

## 2020-05-20 ENCOUNTER — Other Ambulatory Visit (HOSPITAL_COMMUNITY): Payer: Self-pay

## 2020-05-20 MED FILL — Lorazepam Tab 0.5 MG: ORAL | 30 days supply | Qty: 30 | Fill #0 | Status: AC

## 2020-05-26 ENCOUNTER — Telehealth: Payer: Self-pay | Admitting: Family Medicine

## 2020-05-26 DIAGNOSIS — K7031 Alcoholic cirrhosis of liver with ascites: Secondary | ICD-10-CM

## 2020-05-26 NOTE — Telephone Encounter (Signed)
Not feeling as well. Depressed more than usual but no SI/HI. Denies any substance abuse, following good diet. Worries about his health predominating his thoughts. Saw some blood in stool on 2 occasions.  Blood work orders entered. Appt with me tomorrow 1;30 PM. Call sooner with issues. Perhaps ready to re-consider different antidepressant.

## 2020-05-27 ENCOUNTER — Encounter: Payer: Self-pay | Admitting: Family Medicine

## 2020-05-27 ENCOUNTER — Other Ambulatory Visit: Payer: Self-pay

## 2020-05-27 ENCOUNTER — Ambulatory Visit (INDEPENDENT_AMBULATORY_CARE_PROVIDER_SITE_OTHER): Payer: No Typology Code available for payment source | Admitting: Family Medicine

## 2020-05-27 VITALS — BP 120/70 | HR 90

## 2020-05-27 DIAGNOSIS — R531 Weakness: Secondary | ICD-10-CM | POA: Diagnosis not present

## 2020-05-27 DIAGNOSIS — F1019 Alcohol abuse with unspecified alcohol-induced disorder: Secondary | ICD-10-CM | POA: Diagnosis not present

## 2020-05-27 DIAGNOSIS — K7031 Alcoholic cirrhosis of liver with ascites: Secondary | ICD-10-CM

## 2020-05-27 DIAGNOSIS — Z658 Other specified problems related to psychosocial circumstances: Secondary | ICD-10-CM

## 2020-05-27 DIAGNOSIS — K769 Liver disease, unspecified: Secondary | ICD-10-CM

## 2020-05-27 DIAGNOSIS — Z9189 Other specified personal risk factors, not elsewhere classified: Secondary | ICD-10-CM

## 2020-05-27 LAB — POCT HEMOGLOBIN: Hemoglobin: 7.1 g/dL — AB (ref 11–14.6)

## 2020-05-27 MED ORDER — LACTULOSE 10 GM/15ML PO SOLN: 20.0000 g | Freq: Two times a day (BID) | ORAL | 12 refills | Status: DC

## 2020-05-27 NOTE — Assessment & Plan Note (Signed)
Remains abstinent 

## 2020-05-27 NOTE — Progress Notes (Signed)
    CHIEF COMPLAINT / HPI: #1 increased fatigue.  Last 3 days has been extremely tired.  Not somnolent, just has no energy.  Is actually sleeping a little better at night. 2.  Brain fog: Having a lot of difficulty focusing.  This started on Monday of this week.  Just was not sharp.  Feels like he sort of half asleep. 3.  Myalgias: He has had less of the sharp really severe muscle pains but he does have a generalized achiness in the last few days that he is not had before.  No increase in abdominal pain.  He has not had diarrhea he has seen a few flecks of blood in his stool that was bright red.  He has not had large amounts of blood like he has previously.  No fever.  No nasal congestion, no cough, no nausea.  Appetite has been good. No new stressors although he finds himself perseverating on the current stressors to some extent.  He has tried to keep himself active over the weekend doing some housework and very little bit of yard work.  He tried to go for a walk but felt fatigued after about half a mile.   PERTINENT  PMH / PSH: I have reviewed the patient's medications, allergies, past medical and surgical history, smoking status and updated in the EMR as appropriate.   OBJECTIVE:  BP 120/70   Pulse 90  GENERAL: Well-developed male appears to feel poorly. Skin: No true icterus but he has sort of a pale gray appearance. PSYCH: AxOx4. Good eye contact.. No psychomotor retardation or agitation. Appropriate speech fluency and content. Asks and answers questions appropriately. Mood is congruent. He occasionally laughs and still has a good sense of humor. CV: Regular rate and rhythm.  His heart sounds are hyperdynamic.  S1 and S2 are heard and I hear no additional sounds. LUNGS: Clear to auscultation bilaterally.  No rales no wheeze. ABDOMEN: Protuberant as this is baseline.  Minimal to mild ascites.  Nontender. NEURO: Gait is slow.  No gross focal neurological deficits C2 through  12.  ASSESSMENT / PLAN:   No problem-specific Assessment & Plan notes found for this encounter.   Dorcas Mcmurray MD

## 2020-05-27 NOTE — Assessment & Plan Note (Addendum)
Generalized weakness in a patient who has had severe GI bleeds.  He did report a little bit of blood flecks in his stool.  Point-of-care hemoglobin was 7.1 which is not that far down from 2 weeks ago where it was 7.3.  I did send off complete CBC we will get that back in the morning.  I am little concerned that his heart sounds were so hyperdynamic so I was surprised to see that hemoglobin above 7. His heart rate was in the 90s.  That is a little unusual for being on chronic propranolol but potentially it is compensatory so I am not going to make any medication changes at this moment.  He is not sure if he took it this morning.  He may need an echocardiogram in the near future.

## 2020-05-27 NOTE — Assessment & Plan Note (Signed)
Is to repeat MRI.  We will set that up in the next few days.

## 2020-05-27 NOTE — Assessment & Plan Note (Signed)
He is amenable to considering a new and different antidepressant.  His stressors continue to weigh on him quite a bit.  I did not want do that today until we get some more clarity about his current fatigue but will discuss by phone tomorrow.  He denies suicidal or homicidal ideation.  He is hopeful things will improve.

## 2020-05-27 NOTE — Assessment & Plan Note (Signed)
Really does not look like he feels well at all today.  Not sure of the cause.  No associated symptoms that would be concerning for viral URI or gastroenteritis.  No medication changes.  He remains abstinent.  Continues his medications as prescribed.  We will get labs.  I have those back in the morning and I will call him.  We will increase his lactulose which he has been taking once a day to twice daily tonight and will give him further instructions tomorrow.  I did order an ammonia level.  We also need to set up repeat MRI for him but will take care of that when I talk with him tomorrow.

## 2020-05-28 LAB — COMPREHENSIVE METABOLIC PANEL
ALT: 20 IU/L (ref 0–44)
AST: 35 IU/L (ref 0–40)
Albumin/Globulin Ratio: 0.8 — ABNORMAL LOW (ref 1.2–2.2)
Albumin: 3.1 g/dL — ABNORMAL LOW (ref 4.0–5.0)
Alkaline Phosphatase: 96 IU/L (ref 44–121)
BUN/Creatinine Ratio: 8 — ABNORMAL LOW (ref 9–20)
BUN: 6 mg/dL (ref 6–24)
Bilirubin Total: 2.4 mg/dL — ABNORMAL HIGH (ref 0.0–1.2)
CO2: 21 mmol/L (ref 20–29)
Calcium: 8.4 mg/dL — ABNORMAL LOW (ref 8.7–10.2)
Chloride: 101 mmol/L (ref 96–106)
Creatinine, Ser: 0.71 mg/dL — ABNORMAL LOW (ref 0.76–1.27)
Globulin, Total: 4 g/dL (ref 1.5–4.5)
Glucose: 124 mg/dL — ABNORMAL HIGH (ref 65–99)
Potassium: 3.7 mmol/L (ref 3.5–5.2)
Sodium: 135 mmol/L (ref 134–144)
Total Protein: 7.1 g/dL (ref 6.0–8.5)
eGFR: 115 mL/min/{1.73_m2} (ref >59)

## 2020-05-28 LAB — CBC WITH DIFFERENTIAL/PLATELET
Basophils Absolute: 0.1 10*3/uL (ref 0.0–0.2)
Basos: 2 %
EOS (ABSOLUTE): 0.2 10*3/uL (ref 0.0–0.4)
Eos: 8 %
Hematocrit: 23.1 % — ABNORMAL LOW (ref 37.5–51.0)
Hemoglobin: 7.2 g/dL — ABNORMAL LOW (ref 13.0–17.7)
Immature Grans (Abs): 0 10*3/uL (ref 0.0–0.1)
Immature Granulocytes: 0 %
Lymphocytes Absolute: 0.7 10*3/uL (ref 0.7–3.1)
Lymphs: 25 %
MCH: 22.1 pg — ABNORMAL LOW (ref 26.6–33.0)
MCHC: 31.2 g/dL — ABNORMAL LOW (ref 31.5–35.7)
MCV: 71 fL — ABNORMAL LOW (ref 79–97)
Monocytes Absolute: 0.5 10*3/uL (ref 0.1–0.9)
Monocytes: 15 %
Neutrophils Absolute: 1.5 10*3/uL (ref 1.4–7.0)
Neutrophils: 50 %
Platelets: 133 10*3/uL — ABNORMAL LOW (ref 150–450)
RBC: 3.26 x10E6/uL — ABNORMAL LOW (ref 4.14–5.80)
RDW: 15.1 % (ref 11.6–15.4)
WBC: 3 10*3/uL — ABNORMAL LOW (ref 3.4–10.8)

## 2020-05-28 LAB — PROTIME-INR
INR: 1.4 — ABNORMAL HIGH (ref 0.9–1.2)
Prothrombin Time: 14.6 s — ABNORMAL HIGH (ref 9.1–12.0)

## 2020-05-28 LAB — AMMONIA: Ammonia: 67 ug/dL (ref 40–200)

## 2020-05-29 ENCOUNTER — Telehealth: Payer: Self-pay | Admitting: Family Medicine

## 2020-05-29 MED ORDER — DULOXETINE HCL 20 MG PO CPEP: 20.0000 mg | ORAL_CAPSULE | Freq: Every day | ORAL | 0 refills | Status: DC

## 2020-05-29 NOTE — Telephone Encounter (Signed)
I reviewed his test results with him yesterday.  We decided to go ahead and try a new antidepressant.  I sent that in today and sent him a message that his remaining test, the ammonia, was also normal.  We will keep me posted.  He will let me know if anything new or any worsening of symptoms.

## 2020-06-01 ENCOUNTER — Other Ambulatory Visit: Payer: Self-pay | Admitting: Family Medicine

## 2020-06-01 ENCOUNTER — Other Ambulatory Visit (HOSPITAL_COMMUNITY): Payer: Self-pay

## 2020-06-01 MED ORDER — LACTULOSE ENCEPHALOPATHY 10 GM/15ML PO SOLN
Freq: Every day | ORAL | 5 refills | Status: DC
  Filled 2020-06-01: qty 1350, 30d supply, fill #0
  Filled 2020-06-25: qty 1350, 30d supply, fill #1

## 2020-06-02 ENCOUNTER — Other Ambulatory Visit (HOSPITAL_COMMUNITY): Payer: Self-pay

## 2020-06-02 MED ORDER — LACTULOSE 10 GM/15ML PO SOLN
20.0000 g | Freq: Two times a day (BID) | ORAL | 12 refills | Status: DC
  Filled 2020-06-02: qty 3000, 50d supply, fill #0

## 2020-06-03 ENCOUNTER — Other Ambulatory Visit (HOSPITAL_COMMUNITY): Payer: Self-pay

## 2020-06-05 ENCOUNTER — Other Ambulatory Visit: Payer: Self-pay | Admitting: Family Medicine

## 2020-06-05 DIAGNOSIS — K7031 Alcoholic cirrhosis of liver with ascites: Secondary | ICD-10-CM

## 2020-06-05 DIAGNOSIS — K769 Liver disease, unspecified: Secondary | ICD-10-CM

## 2020-06-12 ENCOUNTER — Other Ambulatory Visit (HOSPITAL_COMMUNITY): Payer: Self-pay

## 2020-06-12 MED FILL — Propranolol HCl Tab 10 MG: ORAL | 90 days supply | Qty: 180 | Fill #0 | Status: AC

## 2020-06-16 ENCOUNTER — Other Ambulatory Visit: Payer: Self-pay

## 2020-06-16 ENCOUNTER — Ambulatory Visit
Admission: RE | Admit: 2020-06-16 | Discharge: 2020-06-16 | Disposition: A | Discharge status: Home / Self Care | Payer: No Typology Code available for payment source | Source: Ambulatory Visit | Attending: Family Medicine | Admitting: Family Medicine

## 2020-06-16 DIAGNOSIS — Z9189 Other specified personal risk factors, not elsewhere classified: Secondary | ICD-10-CM

## 2020-06-16 DIAGNOSIS — K769 Liver disease, unspecified: Secondary | ICD-10-CM

## 2020-06-16 DIAGNOSIS — K7031 Alcoholic cirrhosis of liver with ascites: Secondary | ICD-10-CM

## 2020-06-16 IMAGING — MR MR ABDOMEN WO/W CM
11 of 17 series · 24 of 48 positions shown · IV contrast (15 mL multihance)
Comparison: CT of the abdomen and pelvis from [DATE].

CLINICAL DATA: Liver disease, cirrhosis.

EXAM:
MRI ABDOMEN WITHOUT AND WITH CONTRAST
TECHNIQUE: Multiplanar multisequence MR imaging of the abdomen was performed
both before and after the administration of intravenous contrast.
CONTRAST:  15mL MULTIHANCE GADOBENATE DIMEGLUMINE 529 MG/ML IV SOLN

[Series 3: cor haste · coronal · 5.0mm · 0.78mm/px · 1 of 40 slices shown]
[im 1/40]
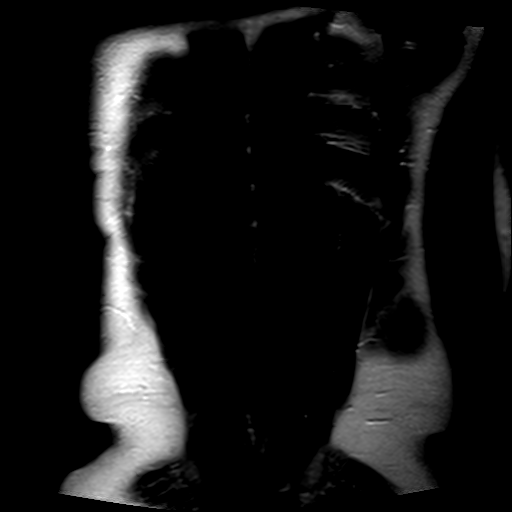

[Series 4: axial haste · axial · 6.0mm · 0.78mm/px · 1 of 35 slices shown]
[im 1/35]
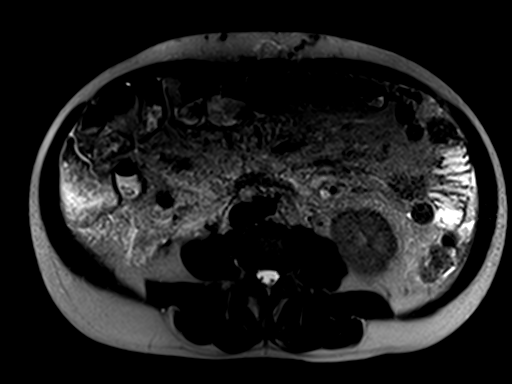

[Series 5: T1 · axial · 6.0mm · 0.78mm/px · 1 of 70 slices shown]
[im 1/70]
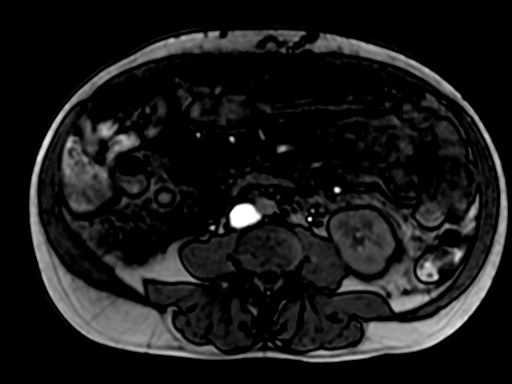

[Series 6: bSSFP · axial · 4.0mm · 0.78mm/px · 1 of 61 slices shown]
[im 1/61]
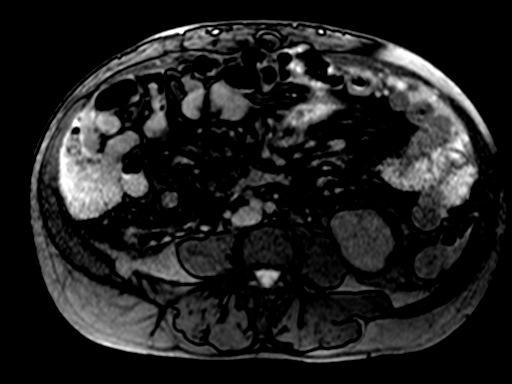

[Series 7: T2 fat-sat · axial · 6.0mm · 1.25mm/px · 1 of 36 slices shown]
[im 1/36]
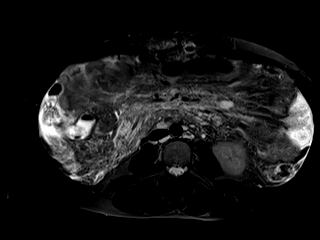

[Series 8: ep2d_[id number]_trig · axial · 6.0mm · 1.82mm/px · z∈[-83,+147]mm · 2 of 99 slices shown]
[im 1/99]
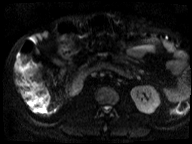
[im 99/99]
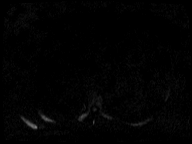

[Series 9: ep2d_[id number]_trig_adc · axial · 6.0mm · 1.82mm/px · 1 of 33 slices shown]
[im 1/33]
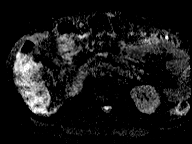

[Series 10: T1 dynamic · axial · non-contrast · 2.5mm · 0.78mm/px · z∈[-134,+123]mm · 4 of 104 slices shown]
[im 1/104]
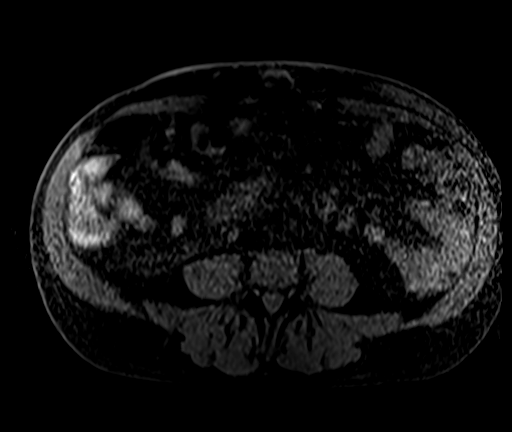
[im 35/104]
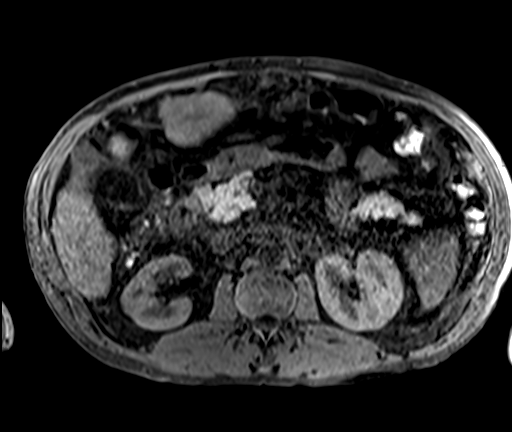
[im 69/104]
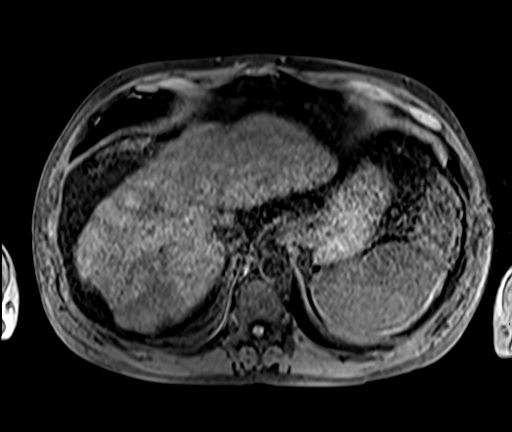
[im 104/104]
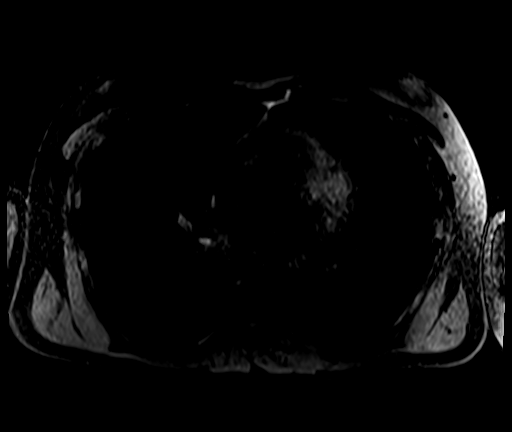

[Series 11: T1 dynamic post-contrast · axial · 2.5mm · 0.78mm/px · z∈[-134,+123]mm · 4 of 104 slices shown (1 of 3)]
[im 1/104]
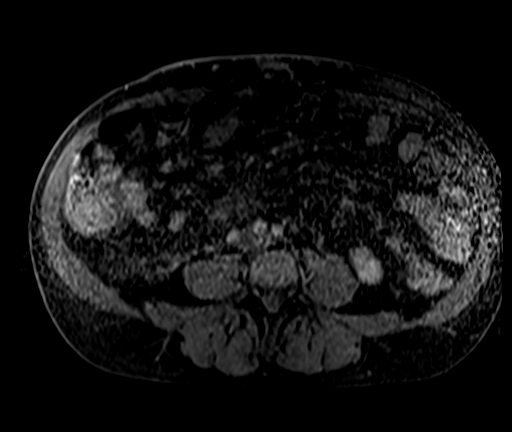
[im 35/104]
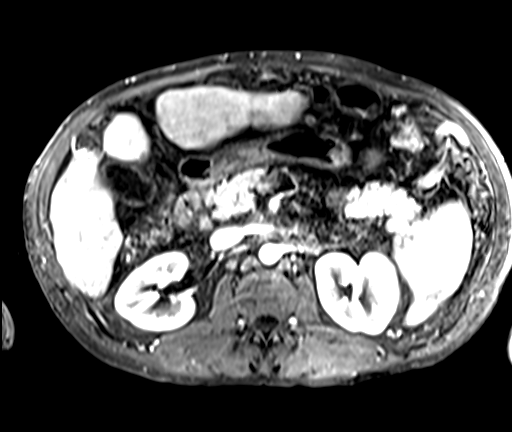
[im 69/104]
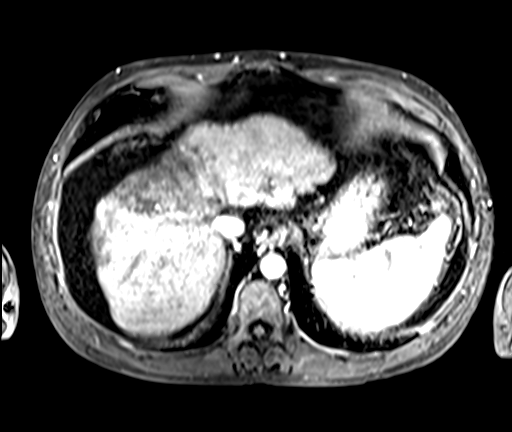
[im 104/104]
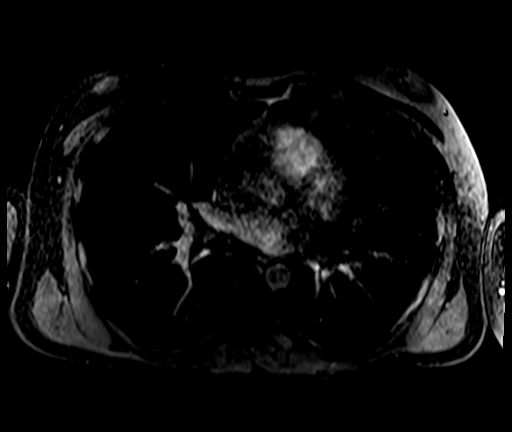

[Series 12: T1 dynamic post-contrast · axial · 2.5mm · 0.78mm/px · z∈[-134,+123]mm · 4 of 104 slices shown (2 of 3)]
[im 1/104]
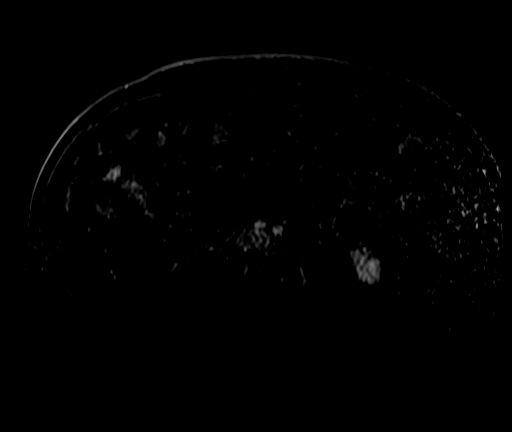
[im 35/104]
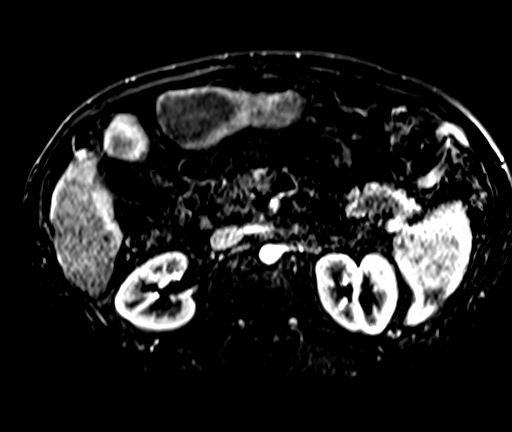
[im 69/104]
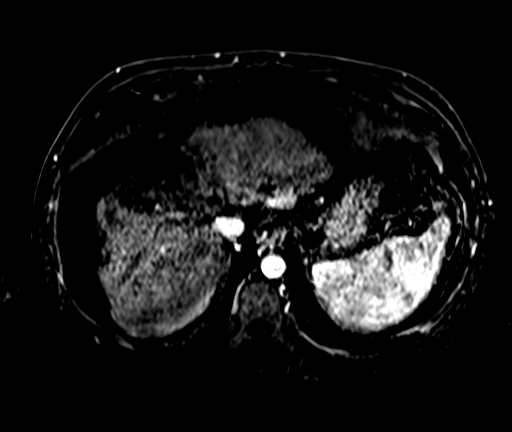
[im 104/104]
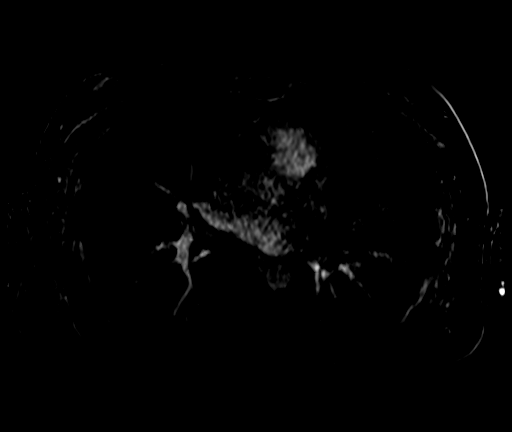

[Series 13: T1 dynamic post-contrast · axial · 2.5mm · 0.78mm/px · z∈[-134,+123]mm · 4 of 104 slices shown (3 of 3)]
[im 1/104]
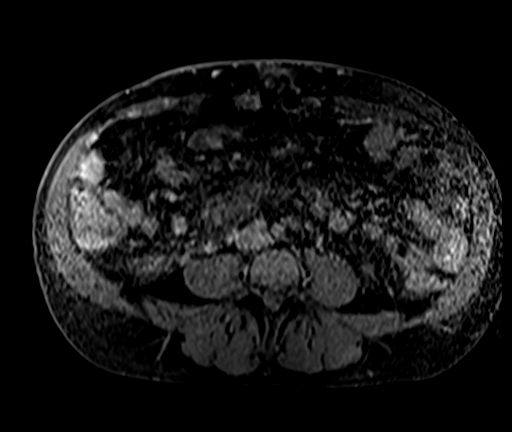
[im 35/104]
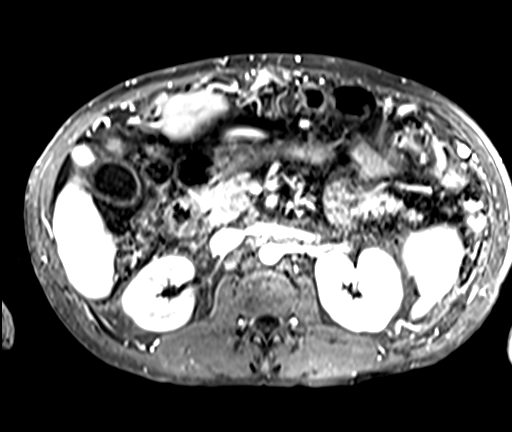
[im 69/104]
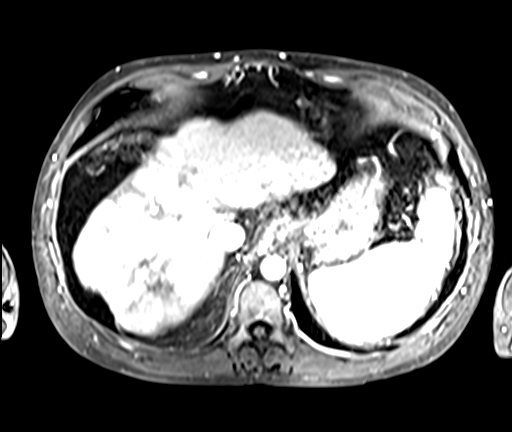
[im 104/104]
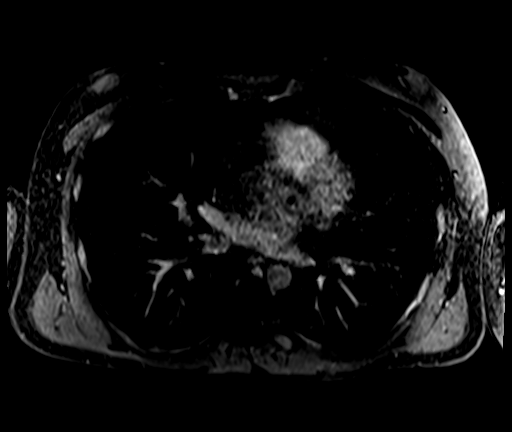

[24 of 48 positions shown; findings below may reference images not displayed]

FINDINGS: Lower chest: Small pericardial cyst along the LEFT heart border. No
consolidation. No pleural effusion. No consolidation or effusion,
limited assessment of the lung bases on abdominal MRI.

Hepatobiliary: Small lesion along the margin of the gallbladder
fossa measures 2.4 x 2.0 cm in shows early peripheral and nodular,
discontinuous enhancement mainly along the posterior margin with
progressive filling that follows blood pool on later phases area is
not changed compared to [DATE].

Within an area of presumed confluent hepatic fibrosis in the central
RIGHT hepatic lobe along the margin of the middle hepatic vein is a
focus of arterial phase enhancement (image 12, series 13) this
displays no sign of washout.

Classic hepatic arterial anatomy.

Cirrhotic morphologic features and signs of portal hypertension as
before.

The portal vein is widely patent.

Pancreas: Normal intrinsic T1 signal. No ductal dilation or sign of
inflammation. No focal lesion.

Spleen: Areas of hypointensity in the spleen on T2 weighted imaging
corresponds to foci of limited enhancement scattered throughout the
peripheral spleen favored to represent sequela of prior splenic
infarct. Mild lobular contour of the spleen. Spleen remains enlarged
approximately 15 cm craniocaudal dimension.

Adrenals/Urinary Tract:  Adrenal glands are normal.

Symmetric renal enhancement. No hydronephrosis or perinephric
stranding.

Stomach/Bowel: No acute gastrointestinal process to the extent
evaluated on abdominal MRI.

Vascular/Lymphatic: Numerous esophageal varices. Signs of
portosystemic collaterals throughout the upper abdomen. Patent
portal vein as discussed. No aortic dilation. Smooth contour of the
patent IVC.

Other:  Ascites.

Musculoskeletal: No suspicious bone lesions identified.
IMPRESSION: Signs of cirrhosis and portal venous hypertension.

Hepatic hemangioma along the liver margin may be partially sclerosed
based on the enhancement pattern.

Hepatic fibrosis with confluent fibrosis in the central liver. Signs
of focus of arterial enhancement within the midst of this finding,
VLADIMIR category 3 suggest 3 month follow-up for further assessment.
This may represent a small vascular shunt lesion along the margin of
fibrotic liver.

Portosystemic collaterals throughout the upper abdomen.

## 2020-06-16 MED ORDER — GADOBENATE DIMEGLUMINE 529 MG/ML IV SOLN
15.0000 mL | Freq: Once | INTRAVENOUS | Status: AC | PRN
  Administered 2020-06-16: 15 mL via INTRAVENOUS

## 2020-06-23 ENCOUNTER — Other Ambulatory Visit (HOSPITAL_COMMUNITY): Payer: Self-pay

## 2020-06-23 ENCOUNTER — Other Ambulatory Visit: Payer: Self-pay | Admitting: Family Medicine

## 2020-06-23 MED ORDER — LACTULOSE 10 GM/15ML PO SOLN: 20.0000 g | Freq: Two times a day (BID) | ORAL | 12 refills | Status: DC

## 2020-06-25 ENCOUNTER — Other Ambulatory Visit (HOSPITAL_COMMUNITY): Payer: Self-pay

## 2020-06-25 ENCOUNTER — Other Ambulatory Visit: Payer: Self-pay | Admitting: Family Medicine

## 2020-06-25 ENCOUNTER — Encounter: Payer: Self-pay | Admitting: Family Medicine

## 2020-06-25 MED ORDER — LORAZEPAM 0.5 MG PO TABS
0.2500 mg | ORAL_TABLET | Freq: Two times a day (BID) | ORAL | 1 refills | Status: DC | PRN
  Filled 2020-06-25: qty 30, 30d supply, fill #0

## 2020-06-25 MED FILL — Folic Acid Tab 1 MG: ORAL | 90 days supply | Qty: 90 | Fill #0 | Status: AC

## 2020-06-25 NOTE — Progress Notes (Signed)
LS Below are the results from your recent visit: Here is the full report so you can take it to your next office visit with the hepatologist.  From my limited understanding of liver imaging, this certainly looks much better than the very first MRI you had.  I will leave it to you and your hepatologist to decide next steps.  Let me know if you have any questions

## 2020-06-26 ENCOUNTER — Other Ambulatory Visit: Payer: Self-pay

## 2020-06-26 ENCOUNTER — Ambulatory Visit (INDEPENDENT_AMBULATORY_CARE_PROVIDER_SITE_OTHER): Payer: No Typology Code available for payment source | Admitting: Family Medicine

## 2020-06-26 ENCOUNTER — Other Ambulatory Visit (HOSPITAL_COMMUNITY): Payer: Self-pay

## 2020-06-26 VITALS — BP 134/55 | Ht 66.0 in | Wt 168.0 lb

## 2020-06-26 DIAGNOSIS — M6259 Muscle wasting and atrophy, not elsewhere classified, multiple sites: Secondary | ICD-10-CM

## 2020-06-26 DIAGNOSIS — R41840 Attention and concentration deficit: Secondary | ICD-10-CM | POA: Diagnosis not present

## 2020-06-26 DIAGNOSIS — F1019 Alcohol abuse with unspecified alcohol-induced disorder: Secondary | ICD-10-CM | POA: Diagnosis not present

## 2020-06-26 DIAGNOSIS — K769 Liver disease, unspecified: Secondary | ICD-10-CM | POA: Diagnosis not present

## 2020-06-26 DIAGNOSIS — Z9189 Other specified personal risk factors, not elsewhere classified: Secondary | ICD-10-CM

## 2020-06-26 MED ORDER — METHYLPHENIDATE HCL ER (CD) 20 MG PO CPCR
20.0000 mg | ORAL_CAPSULE | ORAL | 0 refills | Status: DC
  Filled 2020-06-26: qty 30, 30d supply, fill #0

## 2020-06-27 ENCOUNTER — Encounter: Payer: Self-pay | Admitting: Family Medicine

## 2020-06-27 DIAGNOSIS — R41840 Attention and concentration deficit: Secondary | ICD-10-CM | POA: Insufficient documentation

## 2020-06-27 NOTE — Assessment & Plan Note (Signed)
Continues abstinence

## 2020-06-27 NOTE — Assessment & Plan Note (Signed)
Not sure we can do anything additional except diet, exercise. Discussed light weight bearing exercises for UE/chect as he seems more concerned by those. He has changed his diet which has been helpful and is exercising with walking.

## 2020-06-27 NOTE — Progress Notes (Signed)
  Joshua Hubbard - 45 y.o. male MRN [id number]  Date of birth: October 05, 1975    SUBJECTIVE:      Chief Complaint:/ HPI:   1. Cirrhosis: review of recent MRI.  Still dealing with intermittent abdominal pain and ascites, but generally better over last few months. Has f/u with hepatologist and needs EGD and colonoscopy near future.  2. Anxiety and depression significantly better. He is not sure if it was addition of Cymbalta or just general upward progression but he is pleased. Using low dose lorazepam has been extremely beneficial for anxiety and cravings. Reports he had one really good day last week. Fist one in > 1.5 years (or longer). Made him feel hopeful. Remains abstinent. Following good diet. Trying to walk daily and work in yard.  3. Frustrated by muscle wasting.  4. Complaint of a fogginess that at times interferes with his focus and concentration. Has been ongoing. In some ways it is more problematic now that he is feeling a little better physically---at least he is more aware of it. Particularly noticeable if he decreases or misses lactulose doses. Has to be careful with that though as he has emergent diarrhea as side effect and that can be difficult if he is at work/ in public etc. Focus and attention particularly decreased second half of day.   5 sleeping better. No weird dreams from Cymbalta like he hd with some of other ones.    OBJECTIVE: BP (!) 134/55   Ht 5\' 6"  (1.676 m)   Wt 168 lb (76.2 kg)   BMI 27.12 kg/m   Physical Exam:  Vital signs are reviewed. Vital signs reviewed. GENERAL: Well-developed, NAD CARDIOVASCULAR: Regular rate and rhythm  LUNGS: Clear to auscultation bilaterally, with normal work of breathing ABDOMEN: protuberant but soft. No gross fluid wave. Nontender. NEURO: No gross focal neurological deficits. No tremor MSK: Movement of extremity x 4. Some generalized loss of muscle bulk in chest and extremities. No temporal wasting. PSYCH: AxOx4. Good eye  contact.. No psychomotor retardation or agitation. Appropriate speech fluency and content, although at times he speaks pretty slowly. Asks questions appropriately. Answers are somewhat slowed at times as if he is trying to gather his thoughts. Mood is congruent. Seem a but more upbeat.  SKIN no icterus.  HEENT nonicteric schlera     ASSESSMENT & PLAN:  See problem based charting & AVS for pt instructions. No problem-specific Assessment & Plan notes found for this encounter.

## 2020-06-27 NOTE — Assessment & Plan Note (Signed)
I believe this is more apparent to him now but has been present for quite a while. Likely multifactorial but suspect significant influence from liver disease. Will do field trial of low dose extended  release  methylphenidate and he will let me know if it improves his ability to work full day with needed focus and attention. Continue lactulose regularly as well but understand issues with increasing dose leading to increased diarrheal issues. He iwll let me know next week progress or issues with this medicine.

## 2020-06-27 NOTE — Assessment & Plan Note (Signed)
Reviewed recent MRI with him and he will get disc to take to hepatology appt. No increase in size of abnormal lesion which is encouraging.

## 2020-07-02 ENCOUNTER — Telehealth: Payer: Self-pay | Admitting: Family Medicine

## 2020-07-02 MED ORDER — LACTULOSE 10 GM/15ML PO SOLN: 20.0000 g | Freq: Three times a day (TID) | ORAL | 12 refills | Status: DC

## 2020-07-02 NOTE — Telephone Encounter (Signed)
Joshua Hubbard called me this morning we spoke at length.  He continues to have brain fog.  It got so bad a couple days ago's that his boss told him he should go home.  He admitted that he got lost coming to work.  He called his father who followed him home.  Today he tells me that the brain fog is getting worse, is there most of the time.  He cannot seem to shake it.  He is not able to get in with his hepatologist until July 22.    We will discontinue all benzodiazepines and I was very clear about that.  He may be getting a stacking effect from those.  We will increase his lactulose to 3 times daily for the next 3 days.  He is taken off work so he is home and will have availability of bathroom facilities.  He will update me Monday with his progress.  He will also call me this weekend if things change or worsen.  Regarding the methylphenidate, he did see some mild improvement with that.  Mostly he was able to focus later in the afternoon on the day he took it.  He still had brain fog but was less somnolent.  I think this is somewhat promising.    My understanding of the current situation is probably that he is collecting excess ammonia and other irritants to the brain.  I think totally stopping the benzodiazepines at this point is the best plan.  They were needed initially given for help in his abstinence and with his anxiety.  He is agreeable to that as well as increasing the lactulose.  I will talk with him Monday.

## 2020-07-06 ENCOUNTER — Other Ambulatory Visit (HOSPITAL_COMMUNITY): Payer: Self-pay

## 2020-07-06 MED FILL — Duloxetine HCl Enteric Coated Pellets Cap 20 MG (Base Eq): ORAL | 30 days supply | Qty: 30 | Fill #0 | Status: AC

## 2020-07-17 ENCOUNTER — Other Ambulatory Visit (HOSPITAL_COMMUNITY): Payer: Self-pay

## 2020-07-17 ENCOUNTER — Telehealth: Payer: Self-pay | Admitting: *Deleted

## 2020-07-17 MED FILL — Furosemide Tab 20 MG: ORAL | 90 days supply | Qty: 90 | Fill #0 | Status: AC

## 2020-07-17 NOTE — Telephone Encounter (Signed)
Received refill request for duloxetine to be sent to [facility] in [place] and pt already had refills at [facility].  Contacted pt and he said he wasn't sure why we received that request because he had already picked it up at [place].Paymon Rosensteel Zimmerman Rumple, CMA

## 2020-08-06 ENCOUNTER — Other Ambulatory Visit (HOSPITAL_COMMUNITY): Payer: Self-pay

## 2020-08-06 ENCOUNTER — Other Ambulatory Visit: Payer: Self-pay | Admitting: Family Medicine

## 2020-08-06 MED FILL — Spironolactone Tab 50 MG: ORAL | 90 days supply | Qty: 90 | Fill #0 | Status: AC

## 2020-08-06 MED FILL — Pantoprazole Sodium EC Tab 40 MG (Base Equiv): ORAL | 90 days supply | Qty: 180 | Fill #0 | Status: AC

## 2020-08-07 ENCOUNTER — Other Ambulatory Visit: Payer: Self-pay | Admitting: Family Medicine

## 2020-08-07 ENCOUNTER — Other Ambulatory Visit (HOSPITAL_COMMUNITY): Payer: Self-pay

## 2020-08-07 DIAGNOSIS — K7469 Other cirrhosis of liver: Secondary | ICD-10-CM

## 2020-08-07 MED ORDER — LACTULOSE 10 GM/15ML PO SOLN: 20.0000 g | Freq: Three times a day (TID) | ORAL | 12 refills | Status: DC

## 2020-08-07 NOTE — Progress Notes (Signed)
Lactulose refilled per refil request. Benzo not reordered per chart review discontinued on 07/02/20.

## 2020-08-16 MED FILL — Duloxetine HCl Enteric Coated Pellets Cap 20 MG (Base Eq): ORAL | 30 days supply | Qty: 30 | Fill #1 | Status: AC

## 2020-08-17 ENCOUNTER — Other Ambulatory Visit: Payer: Self-pay

## 2020-08-17 ENCOUNTER — Other Ambulatory Visit (HOSPITAL_COMMUNITY): Payer: Self-pay

## 2020-08-17 DIAGNOSIS — K7469 Other cirrhosis of liver: Secondary | ICD-10-CM

## 2020-08-17 MED ORDER — LACTULOSE ENCEPHALOPATHY 10 GM/15ML PO SOLN
20.0000 g | Freq: Three times a day (TID) | ORAL | 12 refills | Status: DC
  Filled 2020-08-17: qty 2365, 26d supply, fill #0

## 2020-08-18 ENCOUNTER — Other Ambulatory Visit (HOSPITAL_COMMUNITY): Payer: Self-pay

## 2020-08-18 ENCOUNTER — Telehealth: Payer: Self-pay | Admitting: Internal Medicine

## 2020-08-18 NOTE — Telephone Encounter (Signed)
Hi Dr. Carlean Purl, this pt would like to transfer his GI care over to you from [facility] due to insurance coverage since he now works for [employer]. His records are available in [place].  Could you please review them and advise on scheduling? Thank you.

## 2020-08-19 ENCOUNTER — Other Ambulatory Visit (HOSPITAL_COMMUNITY): Payer: Self-pay

## 2020-08-19 MED ORDER — LACTULOSE ENCEPHALOPATHY 10 GM/15ML PO SOLN: 30.0000 g | Freq: Three times a day (TID) | ORAL | 11 refills | Status: DC | PRN

## 2020-08-20 NOTE — Telephone Encounter (Signed)
I will accept this patient.  PJ we can use an end of the morning or and into the afternoon spot to work him in its not an emergency but sometime in August makes sense

## 2020-08-20 NOTE — Telephone Encounter (Signed)
I left him a message to call back and we will work him into the schedule in August.

## 2020-08-21 ENCOUNTER — Encounter: Payer: Self-pay | Admitting: Internal Medicine

## 2020-08-21 NOTE — Telephone Encounter (Signed)
Patient returned call. Schedule OV 8/23 @ 3:10pm

## 2020-08-28 ENCOUNTER — Other Ambulatory Visit (HOSPITAL_COMMUNITY): Payer: Self-pay

## 2020-08-28 ENCOUNTER — Other Ambulatory Visit: Payer: Self-pay

## 2020-08-28 ENCOUNTER — Ambulatory Visit (INDEPENDENT_AMBULATORY_CARE_PROVIDER_SITE_OTHER): Payer: No Typology Code available for payment source | Admitting: Family Medicine

## 2020-08-28 ENCOUNTER — Encounter: Payer: Self-pay | Admitting: Family Medicine

## 2020-08-28 DIAGNOSIS — Z658 Other specified problems related to psychosocial circumstances: Secondary | ICD-10-CM

## 2020-08-28 DIAGNOSIS — F1019 Alcohol abuse with unspecified alcohol-induced disorder: Secondary | ICD-10-CM

## 2020-08-28 DIAGNOSIS — R41840 Attention and concentration deficit: Secondary | ICD-10-CM | POA: Diagnosis not present

## 2020-08-28 DIAGNOSIS — K7030 Alcoholic cirrhosis of liver without ascites: Secondary | ICD-10-CM | POA: Diagnosis not present

## 2020-08-28 MED ORDER — ACAMPROSATE CALCIUM 333 MG PO TBEC: 333.0000 mg | DELAYED_RELEASE_TABLET | Freq: Every day | ORAL | 11 refills | Status: DC

## 2020-08-28 MED ORDER — METHYLPHENIDATE HCL ER (CD) 20 MG PO CPCR
20.0000 mg | ORAL_CAPSULE | ORAL | 0 refills | Status: DC
  Filled 2020-08-28: qty 86, 86d supply, fill #0
  Filled 2020-08-28: qty 4, 4d supply, fill #0

## 2020-08-28 MED ORDER — LACTULOSE ENCEPHALOPATHY 10 GM/15ML PO SOLN: 30.0000 g | Freq: Two times a day (BID) | ORAL | 11 refills | Status: DC

## 2020-08-28 NOTE — Assessment & Plan Note (Signed)
He seems to have entered a stable period.  I agree with getting GI care closer to home.  Most likely the hepatologist at [facility] will still continue to see him yearly.  I think it is good that he does not have imminent need of liver transplant.  I will continue to see him every 2 to 3 months.  We will probably check some blood work at the next office visit if his new GI doctor does not do that.  He knows to call me immediately if he has any change in status.

## 2020-08-28 NOTE — Assessment & Plan Note (Addendum)
Last alcohol intake January 2021 with a small breakthrough June 27, 2019.  No overt episodes of bleeding.  Abdominal pain is significantly improved and he has discontinued the gabapentin at this time.  He has decreased his acamprosate to once a day and his lactulose to twice a day.  We discussed and I think that decreasing the acamprosate is fine as long as he is not having cravings, but he should consider upping the dose immediately if starts having Cravings for alcohol.  Regarding the lactulose, it limits him some in his activities at 3 times a day dosing so we will agree to twice daily dosing.  Again if he starts having brain fog, this is the first thing he needs to increase back.  Is congratulated on his sobriety

## 2020-08-28 NOTE — Progress Notes (Addendum)
Joshua Hubbard - 45 y.o. male MRN [id number]  Date of birth: 12-06-1975    SUBJECTIVE:      Chief Complaint:/ HPI:  Follow-up liver cirrhosis, doing relatively well.  Wants to go over his medicines.  We had also tried some methylphenidate to help him focus and concentrate during the day and that seemed to work well.  He continues to have some problems particularly in the mid afternoon and has run out of the methylphenidate.  He has set up an appointment to see a gastroenterologist here in town hopefully simplify some of his care as it does not look like he is going to need to be on the transplant list anytime soon.  Remains abstinent from alcohol.  He says at this point if he had to choose between the half gallon of ice cream and a bottle of whiskey he would use the ice cream.  Initially had a lot of craving for sweets recently.  He has been working outside almost every day in the garden and seeing his children on a regular basis.  Continues to have large abdomen and he is not happy about that.  We will stop there is any way to decrease the size.  His brain fog is much better and it was significantly better when he was using the methylphenidate.  Occasionally he has trouble sleeping at night and has been taking some children's Benadryl and wonders if that is okay.  He has decreased the dose of his acamprosate and his lactulose and wants to know if that is okay.  Doing well at work.  He and his ex-wife are on relatively good terms at this point and his life is looking up quite a bit.    OBJECTIVE: BP 112/60   Ht '5\' 6"'$  (1.676 m)   Wt 168 lb (76.2 kg)   BMI 27.12 kg/m   Physical Exam:  Vital signs are reviewed. Vital signs reviewed. GENERAL: Well-developed, well-nourished, no acute distress. CARDIOVASCULAR: Regular rate and rhythm no murmur gallop or rub LUNGS: Clear to auscultation bilaterally, no rales or wheeze. ABDOMEN: Soft positive bowel sounds.  Large abdomen consistent with mild  ascites and hepatomegaly. NEURO: No gross focal neurological deficits. MSK: Movement of extremity x 4. PSYCH: AxOx4. Good eye contact.. No psychomotor retardation or agitation. Appropriate speech fluency and content. Asks and answers questions appropriately. Mood is congruent.  His sense of humor is intact and present.    ASSESSMENT & PLAN:  See problem based charting & AVS for pt instructions. Alcohol abuse with unspecified alcohol-induced disorder [facility]) Last alcohol intake January 2021 with a small breakthrough June 27, 2019.  No overt episodes of bleeding.  Abdominal pain is significantly improved and he has discontinued the gabapentin at this time.  He has decreased his acamprosate to once a day and his lactulose to twice a day.  We discussed and I think that decreasing the acamprosate is fine as long as he is not having cravings, but he should consider upping the dose immediately if starts having Cravings for alcohol.  Regarding the lactulose, it limits him some in his activities at 3 times a day dosing so we will agree to twice daily dosing.  Again if he starts having brain fog, this is the first thing he needs to increase back.  Is congratulated on his sobriety  Liver cirrhosis ([place]) He seems to have entered a stable period.  I agree with getting GI care closer to home.  Most likely the hepatologist at  [facility] will still continue to see him yearly.  I think it is good that he does not have imminent need of liver transplant.  I will continue to see him every 2 to 3 months.  We will probably check some blood work at the next office visit if his new GI doctor does not do that.  He knows to call me immediately if he has any change in status.  Psychosocial stressors Improving.  Continues to work full-time.  On better terms with his ex-wife.  Continues to be involved in the lives of his children and his parents.  Attention and concentration deficit We did the field trial of methylphenidate and  it seemed to help significantly with his focus and attention at work.  I see no problem continuing that and have given him a 90-day prescription.

## 2020-08-28 NOTE — Assessment & Plan Note (Signed)
Improving.  Continues to work full-time.  On better terms with his ex-wife.  Continues to be involved in the lives of his children and his parents.

## 2020-08-28 NOTE — Assessment & Plan Note (Signed)
We did the field trial of methylphenidate and it seemed to help significantly with his focus and attention at work.  I see no problem continuing that and have given him a 90-day prescription.

## 2020-09-15 ENCOUNTER — Other Ambulatory Visit (HOSPITAL_COMMUNITY): Payer: Self-pay

## 2020-09-15 ENCOUNTER — Ambulatory Visit (INDEPENDENT_AMBULATORY_CARE_PROVIDER_SITE_OTHER): Payer: No Typology Code available for payment source | Admitting: Internal Medicine

## 2020-09-15 ENCOUNTER — Other Ambulatory Visit (INDEPENDENT_AMBULATORY_CARE_PROVIDER_SITE_OTHER): Payer: No Typology Code available for payment source

## 2020-09-15 ENCOUNTER — Encounter: Payer: Self-pay | Admitting: Internal Medicine

## 2020-09-15 VITALS — BP 90/50 | HR 59 | Ht 66.0 in | Wt 167.0 lb

## 2020-09-15 DIAGNOSIS — R932 Abnormal findings on diagnostic imaging of liver and biliary tract: Secondary | ICD-10-CM

## 2020-09-15 DIAGNOSIS — K7031 Alcoholic cirrhosis of liver with ascites: Secondary | ICD-10-CM | POA: Diagnosis not present

## 2020-09-15 DIAGNOSIS — N62 Hypertrophy of breast: Secondary | ICD-10-CM

## 2020-09-15 DIAGNOSIS — D509 Iron deficiency anemia, unspecified: Secondary | ICD-10-CM

## 2020-09-15 DIAGNOSIS — K7682 Hepatic encephalopathy: Secondary | ICD-10-CM

## 2020-09-15 DIAGNOSIS — K7290 Hepatic failure, unspecified without coma: Secondary | ICD-10-CM

## 2020-09-15 DIAGNOSIS — I8511 Secondary esophageal varices with bleeding: Secondary | ICD-10-CM | POA: Diagnosis not present

## 2020-09-15 DIAGNOSIS — T50905A Adverse effect of unspecified drugs, medicaments and biological substances, initial encounter: Secondary | ICD-10-CM

## 2020-09-15 LAB — PROTIME-INR
INR: 1.8 ratio — ABNORMAL HIGH (ref 0.8–1.0)
Prothrombin Time: 19 s — ABNORMAL HIGH (ref 9.6–13.1)

## 2020-09-15 LAB — AMMONIA: Ammonia: 48 umol/L — ABNORMAL HIGH (ref 11–35)

## 2020-09-15 LAB — COMPREHENSIVE METABOLIC PANEL
ALT: 19 U/L (ref 0–53)
AST: 29 U/L (ref 0–37)
Albumin: 3.1 g/dL — ABNORMAL LOW (ref 3.5–5.2)
Alkaline Phosphatase: 96 U/L (ref 39–117)
BUN: 6 mg/dL (ref 6–23)
CO2: 26 mEq/L (ref 19–32)
Calcium: 8.6 mg/dL (ref 8.4–10.5)
Chloride: 104 mEq/L (ref 96–112)
Creatinine, Ser: 0.57 mg/dL (ref 0.40–1.50)
GFR: 117.75 mL/min (ref >60.00)
Glucose, Bld: 57 mg/dL — ABNORMAL LOW (ref 70–99)
Potassium: 3.7 mEq/L (ref 3.5–5.1)
Sodium: 135 mEq/L (ref 135–145)
Total Bilirubin: 1.6 mg/dL — ABNORMAL HIGH (ref 0.2–1.2)
Total Protein: 7 g/dL (ref 6.0–8.3)

## 2020-09-15 LAB — CBC WITH DIFFERENTIAL/PLATELET
Basophils Absolute: 0.1 10*3/uL (ref 0.0–0.1)
Basophils Relative: 1.4 % (ref 0.0–3.0)
Eosinophils Absolute: 0.2 10*3/uL (ref 0.0–0.7)
Eosinophils Relative: 5.3 % — ABNORMAL HIGH (ref 0.0–5.0)
HCT: 22.5 % — CL (ref 39.0–52.0)
Hemoglobin: 7 g/dL — CL (ref 13.0–17.0)
Lymphocytes Relative: 25.1 % (ref 12.0–46.0)
Lymphs Abs: 1 10*3/uL (ref 0.7–4.0)
MCHC: 31.1 g/dL (ref 30.0–36.0)
MCV: 65.4 fl — ABNORMAL LOW (ref 78.0–100.0)
Monocytes Absolute: 0.7 10*3/uL (ref 0.1–1.0)
Monocytes Relative: 17.2 % — ABNORMAL HIGH (ref 3.0–12.0)
Neutro Abs: 2 10*3/uL (ref 1.4–7.7)
Neutrophils Relative %: 51 % (ref 43.0–77.0)
Platelets: 94 10*3/uL — ABNORMAL LOW (ref 150.0–400.0)
RBC: 3.44 Mil/uL — ABNORMAL LOW (ref 4.22–5.81)
RDW: 18.8 % — ABNORMAL HIGH (ref 11.5–15.5)
WBC: 3.9 10*3/uL — ABNORMAL LOW (ref 4.0–10.5)

## 2020-09-15 MED ORDER — RIFAXIMIN 550 MG PO TABS
550.0000 mg | ORAL_TABLET | Freq: Two times a day (BID) | ORAL | 3 refills | Status: DC
  Filled 2020-09-15: qty 180, 90d supply, fill #0
  Filled 2020-10-15: qty 60, 30d supply, fill #0

## 2020-09-15 MED ORDER — EPLERENONE 50 MG PO TABS
50.0000 mg | ORAL_TABLET | Freq: Every day | ORAL | 1 refills | Status: DC
  Filled 2020-09-15: qty 90, 90d supply, fill #0

## 2020-09-15 NOTE — H&P (View-Only) (Signed)
Joshua Hubbard Bi 45 y.o. [birth year] [id number] Referred by: [name], MD  Assessment & Plan:   Encounter Diagnoses  Name Primary?   Alcoholic cirrhosis of liver with ascites ([place]) Yes   Secondary esophageal varices with bleeding (HCC)    Drug-induced gynecomastia    Encephalopathy, hepatic (HCC)    Abnormal MRI, liver     Complicated patient with chronic liver disease relatively stable at this time.  There has been some question whether or not he has liver cancer at one point but fortunately does not seem to be so.  My plans are outlined below but I will explain further.  Need to review images from the MRIs at [facility].  He will get the discs and then I will give them to radiology to compare he may not need a close follow-up MRI is suggested by radiology.  The gynecomastia is coming from spironolactone and I will switch diuretics as below.  Hopefully we can improve his level of ascites though it seems to be tolerable he would like less fluid retained.  I am not inclined to pursue screening colonoscopy right now we can do that at a later date.  He is understanding excepting of that.  Try to add Xifaxan to his regimen though it may be cost prohibitive.  This could allow for reduction in lactulose.   Further plans pending below   Orders Placed This Encounter  Procedures   Procedural/ Surgical Case Request: ESOPHAGOGASTRODUODENOSCOPY (EGD) WITH PROPOFOL   CBC with Differential/Platelet   Comprehensive metabolic panel   Protime-INR   Ammonia   AFP tumor marker   Ambulatory referral to Gastroenterology   Meds ordered this encounter  Medications   eplerenone (INSPRA) 50 MG tablet    Sig: Take 1 tablet (50 mg total) by mouth daily.    Dispense:  90 tablet    Refill:  1   rifaximin (XIFAXAN) 550 MG TABS tablet    Sig: Take 1 tablet (550 mg total) by mouth 2 (two) times daily.    Dispense:  180 tablet    Refill:  3   Medications Discontinued During This Encounter   Medication Reason   spironolactone (ALDACTONE) 50 MG tablet Side effect (s)   I appreciate the opportunity to care for this patient. CC: [name], MD  Subjective:   Chief Complaint: Cirrhosis of the liver  HPI 45 year old white man with alcoholic cirrhosis and ascites who presented to [facility] in September 2020 and was seen by the [facility] team for hematemesis and found to have small esophageal varices and portal hypertensive gastropathy and had biopsies that were negative for H. pylori.  He was discharged on propranolol 10 mg twice daily.  He had a repeat admission in January 2021 and was cared for by [facility] again and had diarrhea and INR of 10.  He was cared for conservatively and then had an EGD by Dr. [name].  It showed large esophageal varices that were banded.  He had a repeat bleeding episode in November 2021 and was evaluated and treated by Dr. Lu Duffel guide and the rest of the [facility] team.  At his EGD then he was bleeding from varices in the hiatal hernia sac and these were banded successfully.  Just prior to that last admission he had been having some abdominal pain and Dr. Mallie Mussel ordered a CT of the abdomen pelvis. IMPRESSION: 1. Findings suspicious for infiltrative hepatocellular carcinoma in the RIGHT hepatic lobe LR category M. correlation with  AFP and MRI may be helpful for further characterization. There are other scattered areas of hypervascular and or venous enhancement that are seen on the current exam do not meet strict LI-RADS criteria for additional sites of neoplasm but would be better evaluated with MRI to exclude this possibility. 2. Signs of worsening of stigmata of liver disease likely associated with portal colopathy and increased ascites. There is also diffuse bowel edema likely related to portal hypertension. 3. Portal vein is patent. 4. Enlarged periportal lymph nodes often a nonspecific finding in the setting of liver disease, of uncertain  significance given findings in the RIGHT posterior hemi liver described above. 5. Nodular area at the LEFT lung base measures approximately 10 mm. This may be related to atelectatic changes or scarring though given findings in the liver would consider short interval follow-up with chest CT to assess for resolution and or changes and to assess for any other findings of concern in the chest. 6. Cholelithiasis.   The CT from the November admission of 2021 as below  IMPRESSION: 1. Examination is generally limited by breath motion artifact. 2. Stigmata of cirrhosis and portal hypertension, including splenomegaly and moderate volume ascites, increased compared to prior examination. 3. Distended gallbladder containing multiple small gallstones. No biliary ductal dilatation.   Aortic Atherosclerosis (ICD10-I70.0).   He had been referred to and evaluated at [facility] prior to that November admission   MRI there in September 2021   Impression:    Hepatic findings:   1. Heterogenous perfusion of the central liver with associated fat  deposition and edema. Findings may be related to hepatic steatosis and  acute on chronic liver injury. An infiltrative mass such as  cholangiocarcinoma is an additional consideration, however favored less  likely. Findings are not favored to represent infiltrative HCC. Recommend 3  month follow up MRI and correlation with liver tumor markers.  2. Cirrhotic liver morphology and sequela of portal hypertension.     Extrahepatic findings:  None.    01/06/2020 MRI Impression:   1. Marked decrease in the previously seen heterogeneity at the central  aspect of the liver with some residual early hypoenhancement and new  capsular retraction. This finding is favored to represent an evolving acute  liver injury with areas of sclerosis/fibrosis.   2. Interval decreased size of the enlarged spleen. New lesion at the  inferior posterior margin of the spleen which is  favored to represent an  evolving infarction/hematoma. Attention on follow up.   3: Cirrhotic liver morphology with findings of portal hypertension  including increasing, now large volume, ascites. Upper abdominal varices  similar to prior.   He had an EGD with repeat banding January 13, 2020 at [facility], and reports that he had terrible pain and difficulty getting help over the phone after that.  It was chest pain after banding. Large varices with 8 bands and mild portal hypertensive gastropathy.  At that time he was on acamprosate for alcoholism.  He has been abstinent for some time now.  He had been having large-volume paracenteses intermittently every 10 days or so after that hospitalization in November also.  Saw Dr. Posey Pronto of [place] hepatology again in follow-up in January 2022.  Had previously been seen by Dr. Posey Pronto.  He reports that he is having 3 or 4 bowel movements a day and he is tolerating the lactulose but that is somewhat problematic.  He has not been sleepy or confused.  He is abstinent from alcohol he has never been on  Xifaxan.  He had a follow-up MRI here in May.  They did not have the MRIs from [facility] to compare to.  IMPRESSION: Signs of cirrhosis and portal venous hypertension.   Hepatic hemangioma along the liver margin may be partially sclerosed based on the enhancement pattern.   Hepatic fibrosis with confluent fibrosis in the central liver. Signs of focus of arterial enhancement within the midst of this finding, LI-RADS category 3 suggest 3 month follow-up for further assessment. This may represent a small vascular shunt lesion along the margin of fibrotic liver.   Portosystemic collaterals throughout the upper abdomen.     Electronically Signed   By: Zetta Bills M.D.   On: 06/17/2020 20:06  Allergies  Allergen Reactions   Spironolactone Other (See Comments)    Gynecomastia    Tramadol     Made him feel hot and shakey   Current Meds  Medication Sig    acamprosate (CAMPRAL) 333 MG tablet Take 1 tablet (333 mg total) by mouth daily.   eplerenone (INSPRA) 50 MG tablet Take 1 tablet (50 mg total) by mouth daily.   folic acid (FOLVITE) 1 MG tablet TAKE 1 TABLET (1 MG TOTAL) BY MOUTH DAILY.   furosemide (LASIX) 20 MG tablet TAKE 1 TABLET (20 MG TOTAL) BY MOUTH DAILY.   lactulose, encephalopathy, (GENERLAC) 10 GM/15ML SOLN Take 45 mLs (30 g total) by mouth 2 (two) times daily.   methylphenidate (METADATE CD) 20 MG CR capsule Take 1 capsule (20 mg total) by mouth every morning.   Multiple Vitamin (MULTIVITAMIN WITH MINERALS) TABS tablet Take 1 tablet by mouth daily.   pantoprazole (PROTONIX) 40 MG tablet TAKE 1 TABLET BY MOUTH 2 TIMES A DAY   propranolol (INDERAL) 10 MG tablet TAKE 1 TABLET (10 MG TOTAL) BY MOUTH 2 (TWO) TIMES DAILY.   rifaximin (XIFAXAN) 550 MG TABS tablet Take 1 tablet (550 mg total) by mouth 2 (two) times daily.   [DISCONTINUED] spironolactone (ALDACTONE) 50 MG tablet TAKE 1 TABLET (50 MG TOTAL) BY MOUTH DAILY.   Past Medical History:  Diagnosis Date   Arthritis    Blood transfusion without reported diagnosis    recevied 4 units PRBC and FFP during hospitalization from 1/17-1/22/21   Cirrhosis ([place])    Hypertension    PONV (postoperative nausea and vomiting)    Tears of meniscus and ACL of left knee 03/19/2018   Past Surgical History:  Procedure Laterality Date   ARTHROSCOPIC REPAIR ACL     x2   BIOPSY  10/05/2018   Procedure: BIOPSY;  Surgeon: Ronald Lobo, MD;  Location: [place];  Service: Endoscopy;;   ESOPHAGEAL BANDING  02/12/2019   Procedure: ESOPHAGEAL BANDING;  Surgeon: [name], MD;  Location: [place] ENDOSCOPY;  Service: Gastroenterology;;   ESOPHAGEAL BANDING  12/15/2019   Procedure: ESOPHAGEAL BANDING;  Surgeon: Clarene Essex, MD;  Location: [place];  Service: Endoscopy;;   ESOPHAGOGASTRODUODENOSCOPY (EGD) WITH PROPOFOL N/A 10/05/2018   Procedure: ESOPHAGOGASTRODUODENOSCOPY (EGD) WITH PROPOFOL;   Surgeon: Ronald Lobo, MD;  Location: [facility] ENDOSCOPY;  Service: Endoscopy;  Laterality: N/A;   ESOPHAGOGASTRODUODENOSCOPY (EGD) WITH PROPOFOL N/A 02/12/2019   Procedure: ESOPHAGOGASTRODUODENOSCOPY (EGD) WITH PROPOFOL;  Surgeon: [name], MD;  Location: [place];  Service: Gastroenterology;  Laterality: N/A;   ESOPHAGOGASTRODUODENOSCOPY (EGD) WITH PROPOFOL N/A 12/15/2019   Procedure: ESOPHAGOGASTRODUODENOSCOPY (EGD) WITH PROPOFOL;  Surgeon: Clarene Essex, MD;  Location: [place];  Service: Endoscopy;  Laterality: N/A;   HERNIA REPAIR     x3   IR PARACENTESIS  12/20/2019   IR PARACENTESIS  12/25/2019   IR PARACENTESIS  01/07/2020   IR PARACENTESIS  01/10/2020   IR PARACENTESIS  01/15/2020   IR PARACENTESIS  01/21/2020   IR PARACENTESIS  01/29/2020   IR PARACENTESIS  02/18/2020   KNEE ARTHROSCOPY WITH ANTERIOR CRUCIATE LIGAMENT (ACL) REPAIR WITH HAMSTRING GRAFT Left 03/20/2018   Procedure: KNEE ARTHROSCOPY WITH REVESION ANTERIOR CRUCIATE LIGAMENT (ACL) REPAIR WITH  HAMSTRING ALLOGRAFT,;  Surgeon: Elsie Saas, MD;  Location: [place];  Service: Orthopedics;  Laterality: Left;   KNEE ARTHROSCOPY WITH LATERAL MENISECTOMY Left 03/20/2018   Procedure: KNEE ARTHROSCOPY WITH LATERAL MENISECTOMY;  Surgeon: Elsie Saas, MD;  Location: [place];  Service: Orthopedics;  Laterality: Left;   KNEE ARTHROSCOPY WITH MEDIAL MENISECTOMY Left 03/20/2018   Procedure: KNEE ARTHROSCOPY WITH MEDIAL MENISECTOMY;  Surgeon: Elsie Saas, MD;  Location: [place];  Service: Orthopedics;  Laterality: Left;   Social History   Social History Narrative   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   Review of Systems See HPI has had some depressed mood and anxiety and insomnia issues.  All other review of systems are negative  Objective:   Physical Exam '@BP'$  (!) 90/50   Pulse (!) 59   Ht '5\' 6"'$  (1.676 m)   Wt 167 lb (75.8 kg)   BMI 26.95 kg/m @  General:  Well-developed, well-nourished and in no acute distress Eyes:  anicteric. ENT:   Mouth and posterior pharynx free of lesions.  Neck:   supple w/o thyromegaly or mass.  Lungs: Clear to auscultation bilaterally. W/ sl dec BS Right base Heart:  S1S2, no rubs, murmurs, gallops. Abdomen:  Mod large ascites w/ diastasis recti soft, non-tender, no hepatosplenomegaly, hernia, or mass and BS+.   Lymph:  no cervical or supraclavicular adenopathy. Extremities:   no edema, cyanosis or clubbing Skin Spider angiomata Neuro:  A&O x 3. No asterixis Psych:  appropriate mood and  Affect.   Data Reviewed: Extensive review as per HPI

## 2020-09-15 NOTE — Patient Instructions (Addendum)
Your provider has requested that you go to the basement level for lab work before leaving today. Press "B" on the elevator. The lab is located at the first door on the left as you exit the elevator.  Due to recent changes in healthcare laws, you may see the results of your imaging and laboratory studies on MyChart before your provider has had a chance to review them.  We understand that in some cases there may be results that are confusing or concerning to you. Not all laboratory results come back in the same time frame and the provider may be waiting for multiple results in order to interpret others.  Please give [place] 48 hours in order for your provider to thoroughly review all the results before contacting the office for clarification of your results.   Please bring [place] the MRI disc/report from [facility].  You have been scheduled for an endoscopy. Please follow written instructions given to you at your visit today. If you use inhalers (even only as needed), please bring them with you on the day of your procedure.  I appreciate the opportunity to care for you. Silvano Rusk, MD, [facility]

## 2020-09-15 NOTE — Progress Notes (Signed)
Joshua Hubbard 45 y.o. May 18, 1975 [id number] Referred by: [name], MD  Assessment & Plan:   Encounter Diagnoses  Name Primary?   Alcoholic cirrhosis of liver with ascites ([place]) Yes   Secondary esophageal varices with bleeding (HCC)    Drug-induced gynecomastia    Encephalopathy, hepatic (HCC)    Abnormal MRI, liver     Complicated patient with chronic liver disease relatively stable at this time.  There has been some question whether or not he has liver cancer at one point but fortunately does not seem to be so.  My plans are outlined below but I will explain further.  Need to review images from the MRIs at [facility].  He will get the discs and then I will give them to radiology to compare he may not need a close follow-up MRI is suggested by radiology.  The gynecomastia is coming from spironolactone and I will switch diuretics as below.  Hopefully we can improve his level of ascites though it seems to be tolerable he would like less fluid retained.  I am not inclined to pursue screening colonoscopy right now we can do that at a later date.  He is understanding excepting of that.  Try to add Xifaxan to his regimen though it may be cost prohibitive.  This could allow for reduction in lactulose.   Further plans pending below   Orders Placed This Encounter  Procedures   Procedural/ Surgical Case Request: ESOPHAGOGASTRODUODENOSCOPY (EGD) WITH PROPOFOL   CBC with Differential/Platelet   Comprehensive metabolic panel   Protime-INR   Ammonia   AFP tumor marker   Ambulatory referral to Gastroenterology   Meds ordered this encounter  Medications   eplerenone (INSPRA) 50 MG tablet    Sig: Take 1 tablet (50 mg total) by mouth daily.    Dispense:  90 tablet    Refill:  1   rifaximin (XIFAXAN) 550 MG TABS tablet    Sig: Take 1 tablet (550 mg total) by mouth 2 (two) times daily.    Dispense:  180 tablet    Refill:  3   Medications Discontinued During This Encounter   Medication Reason   spironolactone (ALDACTONE) 50 MG tablet Side effect (s)   I appreciate the opportunity to care for this patient. CC: [name], MD  Subjective:   Chief Complaint: Cirrhosis of the liver  HPI 45 year old white man with alcoholic cirrhosis and ascites who presented to [facility] in September 2020 and was seen by the [facility] team for hematemesis and found to have small esophageal varices and portal hypertensive gastropathy and had biopsies that were negative for H. pylori.  He was discharged on propranolol 10 mg twice daily.  He had a repeat admission in January 2021 and was cared for by [facility] again and had diarrhea and INR of 10.  He was cared for conservatively and then had an EGD by Dr. [name].  It showed large esophageal varices that were banded.  He had a repeat bleeding episode in November 2021 and was evaluated and treated by Dr. Lu Duffel guide and the rest of the [facility] team.  At his EGD then he was bleeding from varices in the hiatal hernia sac and these were banded successfully.  Just prior to that last admission he had been having some abdominal pain and Dr. Mallie Mussel ordered a CT of the abdomen pelvis. IMPRESSION: 1. Findings suspicious for infiltrative hepatocellular carcinoma in the RIGHT hepatic lobe LR category M. correlation with  AFP and MRI may be helpful for further characterization. There are other scattered areas of hypervascular and or venous enhancement that are seen on the current exam do not meet strict LI-RADS criteria for additional sites of neoplasm but would be better evaluated with MRI to exclude this possibility. 2. Signs of worsening of stigmata of liver disease likely associated with portal colopathy and increased ascites. There is also diffuse bowel edema likely related to portal hypertension. 3. Portal vein is patent. 4. Enlarged periportal lymph nodes often a nonspecific finding in the setting of liver disease, of uncertain  significance given findings in the RIGHT posterior hemi liver described above. 5. Nodular area at the LEFT lung base measures approximately 10 mm. This may be related to atelectatic changes or scarring though given findings in the liver would consider short interval follow-up with chest CT to assess for resolution and or changes and to assess for any other findings of concern in the chest. 6. Cholelithiasis.   The CT from the November admission of 2021 as below  IMPRESSION: 1. Examination is generally limited by breath motion artifact. 2. Stigmata of cirrhosis and portal hypertension, including splenomegaly and moderate volume ascites, increased compared to prior examination. 3. Distended gallbladder containing multiple small gallstones. No biliary ductal dilatation.   Aortic Atherosclerosis (ICD10-I70.0).   He had been referred to and evaluated at [facility] prior to that November admission   MRI there in September 2021   Impression:    Hepatic findings:   1. Heterogenous perfusion of the central liver with associated fat  deposition and edema. Findings may be related to hepatic steatosis and  acute on chronic liver injury. An infiltrative mass such as  cholangiocarcinoma is an additional consideration, however favored less  likely. Findings are not favored to represent infiltrative HCC. Recommend 3  month follow up MRI and correlation with liver tumor markers.  2. Cirrhotic liver morphology and sequela of portal hypertension.     Extrahepatic findings:  None.    01/06/2020 MRI Impression:   1. Marked decrease in the previously seen heterogeneity at the central  aspect of the liver with some residual early hypoenhancement and new  capsular retraction. This finding is favored to represent an evolving acute  liver injury with areas of sclerosis/fibrosis.   2. Interval decreased size of the enlarged spleen. New lesion at the  inferior posterior margin of the spleen which is  favored to represent an  evolving infarction/hematoma. Attention on follow up.   3: Cirrhotic liver morphology with findings of portal hypertension  including increasing, now large volume, ascites. Upper abdominal varices  similar to prior.   He had an EGD with repeat banding January 13, 2020 at [facility], and reports that he had terrible pain and difficulty getting help over the phone after that.  It was chest pain after banding. Large varices with 8 bands and mild portal hypertensive gastropathy.  At that time he was on acamprosate for alcoholism.  He has been abstinent for some time now.  He had been having large-volume paracenteses intermittently every 10 days or so after that hospitalization in November also.  Saw Dr. Posey Pronto of [place] hepatology again in follow-up in January 2022.  Had previously been seen by Dr. Posey Pronto.  He reports that he is having 3 or 4 bowel movements a day and he is tolerating the lactulose but that is somewhat problematic.  He has not been sleepy or confused.  He is abstinent from alcohol he has never been on  Xifaxan.  He had a follow-up MRI here in May.  They did not have the MRIs from [facility] to compare to.  IMPRESSION: Signs of cirrhosis and portal venous hypertension.   Hepatic hemangioma along the liver margin may be partially sclerosed based on the enhancement pattern.   Hepatic fibrosis with confluent fibrosis in the central liver. Signs of focus of arterial enhancement within the midst of this finding, LI-RADS category 3 suggest 3 month follow-up for further assessment. This may represent a small vascular shunt lesion along the margin of fibrotic liver.   Portosystemic collaterals throughout the upper abdomen.     Electronically Signed   By: Zetta Bills M.D.   On: 06/17/2020 20:06  Allergies  Allergen Reactions   Spironolactone Other (See Comments)    Gynecomastia    Tramadol     Made him feel hot and shakey   Current Meds  Medication Sig    acamprosate (CAMPRAL) 333 MG tablet Take 1 tablet (333 mg total) by mouth daily.   eplerenone (INSPRA) 50 MG tablet Take 1 tablet (50 mg total) by mouth daily.   folic acid (FOLVITE) 1 MG tablet TAKE 1 TABLET (1 MG TOTAL) BY MOUTH DAILY.   furosemide (LASIX) 20 MG tablet TAKE 1 TABLET (20 MG TOTAL) BY MOUTH DAILY.   lactulose, encephalopathy, (GENERLAC) 10 GM/15ML SOLN Take 45 mLs (30 g total) by mouth 2 (two) times daily.   methylphenidate (METADATE CD) 20 MG CR capsule Take 1 capsule (20 mg total) by mouth every morning.   Multiple Vitamin (MULTIVITAMIN WITH MINERALS) TABS tablet Take 1 tablet by mouth daily.   pantoprazole (PROTONIX) 40 MG tablet TAKE 1 TABLET BY MOUTH 2 TIMES A DAY   propranolol (INDERAL) 10 MG tablet TAKE 1 TABLET (10 MG TOTAL) BY MOUTH 2 (TWO) TIMES DAILY.   rifaximin (XIFAXAN) 550 MG TABS tablet Take 1 tablet (550 mg total) by mouth 2 (two) times daily.   [DISCONTINUED] spironolactone (ALDACTONE) 50 MG tablet TAKE 1 TABLET (50 MG TOTAL) BY MOUTH DAILY.   Past Medical History:  Diagnosis Date   Arthritis    Blood transfusion without reported diagnosis    recevied 4 units PRBC and FFP during hospitalization from 1/17-1/22/21   Cirrhosis ([place])    Hypertension    PONV (postoperative nausea and vomiting)    Tears of meniscus and ACL of left knee 03/19/2018   Past Surgical History:  Procedure Laterality Date   ARTHROSCOPIC REPAIR ACL     x2   BIOPSY  10/05/2018   Procedure: BIOPSY;  Surgeon: Ronald Lobo, MD;  Location: [place];  Service: Endoscopy;;   ESOPHAGEAL BANDING  02/12/2019   Procedure: ESOPHAGEAL BANDING;  Surgeon: [name], MD;  Location: [place] ENDOSCOPY;  Service: Gastroenterology;;   ESOPHAGEAL BANDING  12/15/2019   Procedure: ESOPHAGEAL BANDING;  Surgeon: Clarene Essex, MD;  Location: [place];  Service: Endoscopy;;   ESOPHAGOGASTRODUODENOSCOPY (EGD) WITH PROPOFOL N/A 10/05/2018   Procedure: ESOPHAGOGASTRODUODENOSCOPY (EGD) WITH PROPOFOL;   Surgeon: Ronald Lobo, MD;  Location: [facility] ENDOSCOPY;  Service: Endoscopy;  Laterality: N/A;   ESOPHAGOGASTRODUODENOSCOPY (EGD) WITH PROPOFOL N/A 02/12/2019   Procedure: ESOPHAGOGASTRODUODENOSCOPY (EGD) WITH PROPOFOL;  Surgeon: [name], MD;  Location: [place];  Service: Gastroenterology;  Laterality: N/A;   ESOPHAGOGASTRODUODENOSCOPY (EGD) WITH PROPOFOL N/A 12/15/2019   Procedure: ESOPHAGOGASTRODUODENOSCOPY (EGD) WITH PROPOFOL;  Surgeon: Clarene Essex, MD;  Location: [place];  Service: Endoscopy;  Laterality: N/A;   HERNIA REPAIR     x3   IR PARACENTESIS  12/20/2019   IR PARACENTESIS  12/25/2019   IR PARACENTESIS  01/07/2020   IR PARACENTESIS  01/10/2020   IR PARACENTESIS  01/15/2020   IR PARACENTESIS  01/21/2020   IR PARACENTESIS  01/29/2020   IR PARACENTESIS  02/18/2020   KNEE ARTHROSCOPY WITH ANTERIOR CRUCIATE LIGAMENT (ACL) REPAIR WITH HAMSTRING GRAFT Left 03/20/2018   Procedure: KNEE ARTHROSCOPY WITH REVESION ANTERIOR CRUCIATE LIGAMENT (ACL) REPAIR WITH  HAMSTRING ALLOGRAFT,;  Surgeon: Elsie Saas, MD;  Location: [place];  Service: Orthopedics;  Laterality: Left;   KNEE ARTHROSCOPY WITH LATERAL MENISECTOMY Left 03/20/2018   Procedure: KNEE ARTHROSCOPY WITH LATERAL MENISECTOMY;  Surgeon: Elsie Saas, MD;  Location: [place];  Service: Orthopedics;  Laterality: Left;   KNEE ARTHROSCOPY WITH MEDIAL MENISECTOMY Left 03/20/2018   Procedure: KNEE ARTHROSCOPY WITH MEDIAL MENISECTOMY;  Surgeon: Elsie Saas, MD;  Location: [place];  Service: Orthopedics;  Laterality: Left;   Social History   Social History Narrative   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   Review of Systems See HPI has had some depressed mood and anxiety and insomnia issues.  All other review of systems are negative  Objective:   Physical Exam '@BP'$  (!) 90/50   Pulse (!) 59   Ht '5\' 6"'$  (1.676 m)   Wt 167 lb (75.8 kg)   BMI 26.95 kg/m @  General:  Well-developed, well-nourished and in no acute distress Eyes:  anicteric. ENT:   Mouth and posterior pharynx free of lesions.  Neck:   supple w/o thyromegaly or mass.  Lungs: Clear to auscultation bilaterally. W/ sl dec BS Right base Heart:  S1S2, no rubs, murmurs, gallops. Abdomen:  Mod large ascites w/ diastasis recti soft, non-tender, no hepatosplenomegaly, hernia, or mass and BS+.   Lymph:  no cervical or supraclavicular adenopathy. Extremities:   no edema, cyanosis or clubbing Skin Spider angiomata Neuro:  A&O x 3. No asterixis Psych:  appropriate mood and  Affect.   Data Reviewed: Extensive review as per HPI

## 2020-09-16 ENCOUNTER — Other Ambulatory Visit: Payer: Self-pay | Admitting: Internal Medicine

## 2020-09-16 ENCOUNTER — Other Ambulatory Visit (HOSPITAL_COMMUNITY): Payer: Self-pay

## 2020-09-16 DIAGNOSIS — D509 Iron deficiency anemia, unspecified: Secondary | ICD-10-CM

## 2020-09-16 DIAGNOSIS — K7031 Alcoholic cirrhosis of liver with ascites: Secondary | ICD-10-CM

## 2020-09-16 LAB — AFP TUMOR MARKER: AFP-Tumor Marker: 2.2 ng/mL (ref <6.1)

## 2020-09-16 LAB — FERRITIN: Ferritin: 3.5 ng/mL — ABNORMAL LOW (ref 22.0–322.0)

## 2020-09-16 LAB — VITAMIN B12: Vitamin B-12: 632 pg/mL (ref 211–911)

## 2020-09-16 LAB — FOLATE: Folate: 23 ng/mL (ref >5.9)

## 2020-09-17 ENCOUNTER — Other Ambulatory Visit (HOSPITAL_COMMUNITY): Payer: Self-pay

## 2020-09-21 ENCOUNTER — Telehealth: Payer: Self-pay

## 2020-09-21 ENCOUNTER — Other Ambulatory Visit (HOSPITAL_COMMUNITY): Payer: Self-pay

## 2020-09-21 NOTE — Telephone Encounter (Signed)
I spoke with Joshua Hubbard and he will try and come by this week to have B1 drawn. The order is in epic.

## 2020-09-21 NOTE — Telephone Encounter (Signed)
-----   Message from Gatha Mayer, MD sent at 09/21/2020  6:52 AM EDT ----- Yes please  Use cirrhosis dx ----- Message ----- From: [place], Tasha Diaz E, [place]: 09/17/2020   1:31 PM EDT To: Gatha Mayer, MD  Sir, the lab called and they were unable to run the B1 level on the blood already drawn. They ran the other labs. Do you want him to come back and have this one drawn prior to his EGD to be done at [facility] on 10/13/20?

## 2020-09-23 ENCOUNTER — Other Ambulatory Visit (HOSPITAL_COMMUNITY): Payer: Self-pay

## 2020-09-24 ENCOUNTER — Other Ambulatory Visit: Payer: No Typology Code available for payment source

## 2020-09-24 ENCOUNTER — Other Ambulatory Visit: Payer: Self-pay | Admitting: Family Medicine

## 2020-09-24 ENCOUNTER — Other Ambulatory Visit (HOSPITAL_COMMUNITY): Payer: Self-pay

## 2020-09-24 DIAGNOSIS — K7031 Alcoholic cirrhosis of liver with ascites: Secondary | ICD-10-CM

## 2020-09-24 DIAGNOSIS — D509 Iron deficiency anemia, unspecified: Secondary | ICD-10-CM

## 2020-09-24 MED FILL — Propranolol HCl Tab 10 MG: ORAL | 90 days supply | Qty: 180 | Fill #1 | Status: AC

## 2020-09-25 ENCOUNTER — Ambulatory Visit (INDEPENDENT_AMBULATORY_CARE_PROVIDER_SITE_OTHER): Payer: No Typology Code available for payment source | Admitting: Family Medicine

## 2020-09-25 ENCOUNTER — Encounter: Payer: Self-pay | Admitting: Family Medicine

## 2020-09-25 ENCOUNTER — Other Ambulatory Visit: Payer: Self-pay

## 2020-09-25 ENCOUNTER — Other Ambulatory Visit (HOSPITAL_COMMUNITY): Payer: Self-pay

## 2020-09-25 DIAGNOSIS — T50905A Adverse effect of unspecified drugs, medicaments and biological substances, initial encounter: Secondary | ICD-10-CM

## 2020-09-25 DIAGNOSIS — M6259 Muscle wasting and atrophy, not elsewhere classified, multiple sites: Secondary | ICD-10-CM

## 2020-09-25 DIAGNOSIS — R41840 Attention and concentration deficit: Secondary | ICD-10-CM

## 2020-09-25 DIAGNOSIS — K7030 Alcoholic cirrhosis of liver without ascites: Secondary | ICD-10-CM | POA: Diagnosis not present

## 2020-09-25 DIAGNOSIS — F1019 Alcohol abuse with unspecified alcohol-induced disorder: Secondary | ICD-10-CM

## 2020-09-25 DIAGNOSIS — N62 Hypertrophy of breast: Secondary | ICD-10-CM

## 2020-09-25 MED ORDER — LACTULOSE ENCEPHALOPATHY 10 GM/15ML PO SOLN
30.0000 g | Freq: Two times a day (BID) | ORAL | 11 refills | Status: DC
  Filled 2020-09-25: qty 5203, 58d supply, fill #0

## 2020-09-25 MED ORDER — OXYCODONE HCL 15 MG PO TABS
15.0000 mg | ORAL_TABLET | ORAL | 0 refills | Status: DC
  Filled 2020-09-25: qty 15, 2d supply, fill #0

## 2020-09-25 NOTE — Assessment & Plan Note (Signed)
Feels like he is starting to at least maintain his current muscle mass.  He does note he is somewhat cold all the time and wonders if this is related to some of his medicines.  I think it is probably related to his sarcopenia.  He looks like he is actually gained a little muscle mass since I saw him last.  I would recommend continued activities

## 2020-09-25 NOTE — Assessment & Plan Note (Signed)
GI doctor recommended switch from spironolactone to eplerenone which he has done.

## 2020-09-25 NOTE — Progress Notes (Signed)
  Joshua Hubbard - 45 y.o. male MRN [id number]  Date of birth: 15-May-1975    SUBJECTIVE:      Chief Complaint:/ HPI:  #1.  Cirrhosis: Saw the new GI doctor.  Has EGD scheduled.  I made some medication changes.  He is not able to afford the rifaximin. 2.  Brain fog/attentional issues: Much better with the methylphenidate. 3.  Alcohol abuse: In remission.  He has decreased his acamprosate to once or twice a day.  We discussed.    OBJECTIVE: Ht '5\' 6"'$  (1.676 m)   Wt 168 lb (76.2 kg)   BMI 27.12 kg/m   Physical Exam:  Vital signs are reviewed. Vital signs reviewed. GENERAL: Well-developed, well-nourished, no acute distress. CARDIOVASCULAR: Regular rate and rhythm no murmur gallop or rub LUNGS: Clear to auscultation bilaterally, no rales or wheeze. ABDOMEN: Soft positive bowel sounds.  No ascites is noted.  Hepatosplenomegaly is baseline. NEURO: No gross focal neurological deficits. MSK: Movement of extremity x 4.   ASSESSMENT & PLAN:  See problem based charting & AVS for pt instructions. Liver cirrhosis ([place]) Doing better with fluid status.  Has not had to have paracentesis.  Saw GI doctor here in town and likes him.  They recommended rifaximin to potentially replace or supplement lactulose but it is very expensive so for right now he is going to continue on the lactulose.  I refilled that.  Alcohol abuse with unspecified alcohol-induced disorder ([place]) Currently still in remission.  We talked about triggers and events that might trigger such as his upcoming EGD.  Last time he had a lot of pain with the banding so this time I have given him 2 to 3 days of some oxycodone.  I did tell him this might reawaken his cravings and we recommended increasing his acamprosate back to 3 times daily for the week prior in the week afterwards.  He is also aware of potential triggers.  Attention and concentration deficit Methylphenidate seems to be making fairly significant difference for him and he is  not having any problems with it so we will continue.  Drug-induced gynecomastia GI doctor recommended switch from spironolactone to eplerenone which he has done.  Atrophy of muscle of multiple sites Feels like he is starting to at least maintain his current muscle mass.  He does note he is somewhat cold all the time and wonders if this is related to some of his medicines.  I think it is probably related to his sarcopenia.  He looks like he is actually gained a little muscle mass since I saw him last.  I would recommend continued activities

## 2020-09-25 NOTE — Assessment & Plan Note (Signed)
Doing better with fluid status.  Has not had to have paracentesis.  Saw GI doctor here in town and likes him.  They recommended rifaximin to potentially replace or supplement lactulose but it is very expensive so for right now he is going to continue on the lactulose.  I refilled that.

## 2020-09-25 NOTE — Assessment & Plan Note (Signed)
Methylphenidate seems to be making fairly significant difference for him and he is not having any problems with it so we will continue.

## 2020-09-25 NOTE — Assessment & Plan Note (Signed)
Currently still in remission.  We talked about triggers and events that might trigger such as his upcoming EGD.  Last time he had a lot of pain with the banding so this time I have given him 2 to 3 days of some oxycodone.  I did tell him this might reawaken his cravings and we recommended increasing his acamprosate back to 3 times daily for the week prior in the week afterwards.  He is also aware of potential triggers.

## 2020-10-01 LAB — VITAMIN B1: Vitamin B1 (Thiamine): 22 nmol/L (ref 8–30)

## 2020-10-02 ENCOUNTER — Other Ambulatory Visit (HOSPITAL_COMMUNITY): Payer: Self-pay

## 2020-10-07 ENCOUNTER — Encounter (HOSPITAL_COMMUNITY): Payer: Self-pay | Admitting: Internal Medicine

## 2020-10-07 ENCOUNTER — Other Ambulatory Visit: Payer: Self-pay

## 2020-10-07 NOTE — Progress Notes (Signed)
Attempted to obtain medical history via telephone, unable to reach at this time. I left a voicemail to return pre surgical testing department's phone call.  

## 2020-10-13 ENCOUNTER — Other Ambulatory Visit (HOSPITAL_COMMUNITY): Payer: Self-pay

## 2020-10-13 ENCOUNTER — Other Ambulatory Visit: Payer: Self-pay | Admitting: Internal Medicine

## 2020-10-13 ENCOUNTER — Other Ambulatory Visit: Payer: Self-pay

## 2020-10-13 ENCOUNTER — Encounter (HOSPITAL_COMMUNITY)
Admission: RE | Disposition: A | Discharge status: Home / Self Care | Payer: Self-pay | Source: Ambulatory Visit | Attending: Internal Medicine

## 2020-10-13 ENCOUNTER — Ambulatory Visit (HOSPITAL_COMMUNITY): Payer: No Typology Code available for payment source | Admitting: Anesthesiology

## 2020-10-13 ENCOUNTER — Ambulatory Visit (HOSPITAL_COMMUNITY)
Admission: RE | Admit: 2020-10-13 | Discharge: 2020-10-13 | Disposition: A | Discharge status: Home / Self Care | Payer: No Typology Code available for payment source | Source: Ambulatory Visit | Attending: Internal Medicine | Admitting: Internal Medicine

## 2020-10-13 DIAGNOSIS — Z888 Allergy status to other drugs, medicaments and biological substances status: Secondary | ICD-10-CM | POA: Insufficient documentation

## 2020-10-13 DIAGNOSIS — D1803 Hemangioma of intra-abdominal structures: Secondary | ICD-10-CM | POA: Insufficient documentation

## 2020-10-13 DIAGNOSIS — K766 Portal hypertension: Secondary | ICD-10-CM | POA: Diagnosis not present

## 2020-10-13 DIAGNOSIS — K3189 Other diseases of stomach and duodenum: Secondary | ICD-10-CM | POA: Diagnosis not present

## 2020-10-13 DIAGNOSIS — Z79899 Other long term (current) drug therapy: Secondary | ICD-10-CM | POA: Diagnosis not present

## 2020-10-13 DIAGNOSIS — Z885 Allergy status to narcotic agent status: Secondary | ICD-10-CM | POA: Diagnosis not present

## 2020-10-13 DIAGNOSIS — Z8249 Family history of ischemic heart disease and other diseases of the circulatory system: Secondary | ICD-10-CM | POA: Diagnosis not present

## 2020-10-13 DIAGNOSIS — K7031 Alcoholic cirrhosis of liver with ascites: Secondary | ICD-10-CM

## 2020-10-13 DIAGNOSIS — K7290 Hepatic failure, unspecified without coma: Secondary | ICD-10-CM | POA: Insufficient documentation

## 2020-10-13 DIAGNOSIS — R161 Splenomegaly, not elsewhere classified: Secondary | ICD-10-CM | POA: Insufficient documentation

## 2020-10-13 DIAGNOSIS — I8510 Secondary esophageal varices without bleeding: Secondary | ICD-10-CM | POA: Insufficient documentation

## 2020-10-13 DIAGNOSIS — N62 Hypertrophy of breast: Secondary | ICD-10-CM | POA: Diagnosis not present

## 2020-10-13 DIAGNOSIS — I700 Atherosclerosis of aorta: Secondary | ICD-10-CM | POA: Diagnosis not present

## 2020-10-13 DIAGNOSIS — K8020 Calculus of gallbladder without cholecystitis without obstruction: Secondary | ICD-10-CM | POA: Diagnosis not present

## 2020-10-13 DIAGNOSIS — R599 Enlarged lymph nodes, unspecified: Secondary | ICD-10-CM | POA: Insufficient documentation

## 2020-10-13 DIAGNOSIS — I8511 Secondary esophageal varices with bleeding: Secondary | ICD-10-CM

## 2020-10-13 HISTORY — PX: ESOPHAGOGASTRODUODENOSCOPY (EGD) WITH PROPOFOL: SHX5813

## 2020-10-13 SURGERY — ESOPHAGOGASTRODUODENOSCOPY (EGD) WITH PROPOFOL
Anesthesia: Monitor Anesthesia Care

## 2020-10-13 MED ORDER — LACTULOSE ENCEPHALOPATHY 10 GM/15ML PO SOLN: 10.0000 g | Freq: Three times a day (TID) | ORAL | Status: DC

## 2020-10-13 MED ORDER — LACTATED RINGERS IV SOLN: Freq: Once | INTRAVENOUS | Status: AC

## 2020-10-13 MED ORDER — PROPOFOL 500 MG/50ML IV EMUL
INTRAVENOUS | Status: AC
  Filled 2020-10-13: qty 50

## 2020-10-13 MED ORDER — PROPOFOL 500 MG/50ML IV EMUL
INTRAVENOUS | Status: DC | PRN
  Administered 2020-10-13: 150 ug/kg/min via INTRAVENOUS

## 2020-10-13 MED ORDER — SODIUM CHLORIDE 0.9 % IV SOLN: INTRAVENOUS | Status: DC

## 2020-10-13 MED ORDER — LACTATED RINGERS IV SOLN: INTRAVENOUS | Status: DC | PRN

## 2020-10-13 MED ORDER — LIDOCAINE HCL 1 % IJ SOLN
INTRAMUSCULAR | Status: DC | PRN
Start: 2020-10-13 — End: 2020-10-13
  Administered 2020-10-13: 60 mg via INTRADERMAL

## 2020-10-13 MED ORDER — PROPOFOL 10 MG/ML IV BOLUS
INTRAVENOUS | Status: DC | PRN
  Administered 2020-10-13: 10 mg via INTRAVENOUS
  Administered 2020-10-13: 20 mg via INTRAVENOUS

## 2020-10-13 MED FILL — Furosemide Tab 20 MG: ORAL | 90 days supply | Qty: 90 | Fill #1 | Status: AC

## 2020-10-13 SURGICAL SUPPLY — 14 items

## 2020-10-13 NOTE — Op Note (Addendum)
[facility] Patient Name: Joshua Hubbard Procedure Date: 10/13/2020 MRN: [id number] Attending MD: Gatha Mayer , MD Date of Birth: 09-17-1975 CSN: [id number] Age: 45 Admit Type: Outpatient Procedure:                Upper GI endoscopy Indications:              Esophageal varices, Follow-up of esophageal                            varices, For therapy of esophageal varices Providers:                Gatha Mayer, MD, Doristine Johns, RN, Laverda Sorenson, Technician, Karis Juba, CRNA Referring MD:              Medicines:                Propofol per Anesthesia, Monitored Anesthesia Care Complications:            No immediate complications. Estimated Blood Loss:     Estimated blood loss: none. Procedure:                Pre-Anesthesia Assessment:                           - Prior to the procedure, a History and Physical                            was performed, and patient medications and                            allergies were reviewed. The patient's tolerance of                            previous anesthesia was also reviewed. The risks                            and benefits of the procedure and the sedation                            options and risks were discussed with the patient.                            All questions were answered, and informed consent                            was obtained. Prior Anticoagulants: The patient has                            taken no previous anticoagulant or antiplatelet                            agents. ASA Grade Assessment: III - A patient with  severe systemic disease. After reviewing the risks                            and benefits, the patient was deemed in                            satisfactory condition to undergo the procedure.                           After obtaining informed consent, the endoscope was                            passed under direct vision.  Throughout the                            procedure, the patient's blood pressure, pulse, and                            oxygen saturations were monitored continuously. The                            [id number]-[id number] ([id number] Olympus endoscope was introduced                            through the mouth, and advanced to the second part                            of duodenum. The upper GI endoscopy was                            accomplished without difficulty. The patient                            tolerated the procedure well. Scope In: Scope Out: Findings:      Grade I varices were found in the middle third of the esophagus and in       the lower third of the esophagus. They were 3 mm in largest diameter.      Mild portal hypertensive gastropathy was found in the stomach.      The exam was otherwise without abnormality.      The cardia and gastric fundus were normal on retroflexion. Impression:               - Grade I esophageal varices.                           - Portal hypertensive gastropathy.                           - The examination was otherwise normal.                           - No specimens collected. Moderate Sedation:      Not Applicable - Patient had care per Anesthesia. Recommendation:           - Patient has a contact number  available for                            emergencies. The signs and symptoms of potential                            delayed complications were discussed with the                            patient. Return to normal activities tomorrow.                            Written discharge instructions were provided to the                            patient.                           - Resume previous diet.                           - Continue present medications.                           - Repeat upper endoscopy in 1 year for surveillance.                           Office visit me in November 2022                           I will have him do labs first week of  October                            (ordered CBC, CMET) Procedure Code(s):        --- Professional ---                           [phone], Esophagogastroduodenoscopy, flexible,                            transoral; diagnostic, including collection of                            specimen(s) by brushing or washing, when performed                            (separate procedure) Diagnosis Code(s):        --- Professional ---                           I85.00, Esophageal varices without bleeding                           K76.6, Portal hypertension                           K31.89, Other diseases of stomach and duodenum CPT  copyright 2019 American Medical Association. All rights reserved. The codes documented in this report are preliminary and upon coder review may  be revised to meet current compliance requirements. Gatha Mayer, MD 10/13/2020 1:49:05 PM This report has been signed electronically. Number of Addenda: 0

## 2020-10-13 NOTE — Anesthesia Preprocedure Evaluation (Addendum)
Anesthesia Evaluation  Patient identified by MRN, date of birth, ID band Patient awake    Reviewed: Allergy & Precautions, NPO status , Patient's Chart, lab work & pertinent test results, reviewed documented beta blocker date and time   History of Anesthesia Complications (+) PONV and history of anesthetic complications  Airway Mallampati: I  TM Distance: >3 FB Neck ROM: Full    Dental  (+) Dental Advisory Given, Teeth Intact   Pulmonary neg pulmonary ROS,    Pulmonary exam normal        Cardiovascular hypertension, Pt. on home beta blockers and Pt. on medications Normal cardiovascular exam     Neuro/Psych PSYCHIATRIC DISORDERS  Tinnitus Hoarseness     GI/Hepatic GERD  Medicated and Controlled,(+) Cirrhosis   Esophageal Varices and ascites  substance abuse (sober x 1.5 yrs)  alcohol use,   Endo/Other  negative endocrine ROS  Renal/GU negative Renal ROS     Musculoskeletal  (+) Arthritis ,   Abdominal   Peds  Hematology   Anesthesia Other Findings   Reproductive/Obstetrics                            Anesthesia Physical Anesthesia Plan  ASA: 4  Anesthesia Plan: MAC   Post-op Pain Management:    Induction:   PONV Risk Score and Plan: 2 and Propofol infusion and Treatment may vary due to age or medical condition  Airway Management Planned: Nasal Cannula and Natural Airway  Additional Equipment: None  Intra-op Plan:   Post-operative Plan:   Informed Consent: I have reviewed the patients History and Physical, chart, labs and discussed the procedure including the risks, benefits and alternatives for the proposed anesthesia with the patient or authorized representative who has indicated his/her understanding and acceptance.       Plan Discussed with: CRNA and Anesthesiologist  Anesthesia Plan Comments:        Anesthesia Quick Evaluation

## 2020-10-13 NOTE — Interval H&P Note (Signed)
History and Physical Interval Note:  10/13/2020 1:16 PM  Joshua Hubbard  has presented today for surgery, with the diagnosis of cirrhosis.  The various methods of treatment have been discussed with the patient and family. After consideration of risks, benefits and other options for treatment, the patient has consented to  Procedure(s): ESOPHAGOGASTRODUODENOSCOPY (EGD) WITH PROPOFOL (N/A) ESOPHAGEAL BANDING (N/A) as a surgical intervention.  The patient's history has been reviewed, patient examined, no change in status, stable for surgery.  I have reviewed the patient's chart and labs.  Questions were answered to the patient's satisfaction.     Silvano Rusk

## 2020-10-13 NOTE — Discharge Instructions (Addendum)
The varices are small and did not require banding today.  I will plan to examine again in 1 year.  I want you to do labs in October - see follow-up plans and I will arrange a November office visit and we will notify via My Chart.  I appreciate the opportunity to care for you. Gatha Mayer, MD, FACG  YOU HAD AN ENDOSCOPIC PROCEDURE TODAY: Refer to the procedure report and other information in the discharge instructions given to you for any specific questions about what was found during the examination. If this information does not answer your questions, please call [place] office at [phone] to clarify.   YOU SHOULD EXPECT: Some feelings of bloating in the abdomen. Passage of more gas than usual. Walking can help get rid of the air that was put into your GI tract during the procedure and reduce the bloating. If you had a lower endoscopy (such as a colonoscopy or flexible sigmoidoscopy) you may notice spotting of blood in your stool or on the toilet paper. Some abdominal soreness may be present for a day or two, also.  DIET: Your first meal following the procedure should be a light meal and then it is ok to progress to your normal diet. A half-sandwich or bowl of soup is an example of a good first meal. Heavy or fried foods are harder to digest and may make you feel nauseous or bloated. Drink plenty of fluids but you should avoid alcoholic beverages for 24 hours. If you had a esophageal dilation, please see attached instructions for diet.    ACTIVITY: Your care partner should take you home directly after the procedure. You should plan to take it easy, moving slowly for the rest of the day. You can resume normal activity the day after the procedure however YOU SHOULD NOT DRIVE, use power tools, machinery or perform tasks that involve climbing or major physical exertion for 24 hours (because of the sedation medicines used during the test).   SYMPTOMS TO REPORT IMMEDIATELY: A gastroenterologist  can be reached at any hour. Please call [phone]  for any of the following symptoms:   Following upper endoscopy (EGD, EUS, ERCP, esophageal dilation) Vomiting of blood or coffee ground material  New, significant abdominal pain  New, significant chest pain or pain under the shoulder blades  Painful or persistently difficult swallowing  New shortness of breath  Black, tarry-looking or red, bloody stools  FOLLOW UP:  If any biopsies were taken you will be contacted by phone or by letter within the next 1-3 weeks. Call [phone]  if you have not heard about the biopsies in 3 weeks.  Please also call with any specific questions about appointments or follow up tests.

## 2020-10-13 NOTE — Transfer of Care (Signed)
Immediate Anesthesia Transfer of Care Note  Patient: Joshua Hubbard  Procedure(s) Performed: ESOPHAGOGASTRODUODENOSCOPY (EGD) WITH PROPOFOL  Patient Location: PACU  Anesthesia Type:MAC  Level of Consciousness: awake, alert  and oriented  Airway & Oxygen Therapy: Patient Spontanous Breathing and Patient connected to face mask oxygen  Post-op Assessment: Report given to RN, Post -op Vital signs reviewed and stable and Patient moving all extremities X 4  Post vital signs: Reviewed and stable  Last Vitals:  Vitals Value Taken Time  BP 100/44   Temp    Pulse 67   Resp    SpO2 100     Last Pain:  Vitals:   10/13/20 1216  TempSrc: Oral  PainSc: 5          Complications: No notable events documented.

## 2020-10-13 NOTE — Anesthesia Postprocedure Evaluation (Signed)
Anesthesia Post Note  Patient: Keith Felten Leanos  Procedure(s) Performed: ESOPHAGOGASTRODUODENOSCOPY (EGD) WITH PROPOFOL     Patient location during evaluation: PACU Anesthesia Type: MAC Level of consciousness: awake and alert Pain management: pain level controlled Vital Signs Assessment: post-procedure vital signs reviewed and stable Respiratory status: spontaneous breathing, nonlabored ventilation and respiratory function stable Cardiovascular status: stable and blood pressure returned to baseline Anesthetic complications: no   No notable events documented.  Last Vitals:  Vitals:   10/13/20 1340 10/13/20 1350  BP: (!) 105/49 (!) 123/53  Pulse: 67 (!) 56  Resp: 18 14  Temp:    SpO2: 99% 97%    Last Pain:  Vitals:   10/13/20 1340  TempSrc:   PainSc: 0-No pain                 Audry Pili

## 2020-10-14 ENCOUNTER — Encounter (HOSPITAL_COMMUNITY): Payer: Self-pay | Admitting: Internal Medicine

## 2020-10-15 ENCOUNTER — Other Ambulatory Visit: Payer: Self-pay

## 2020-10-15 ENCOUNTER — Other Ambulatory Visit (HOSPITAL_COMMUNITY): Payer: Self-pay

## 2020-10-15 MED ORDER — RIFAXIMIN 550 MG PO TABS: 550.0000 mg | ORAL_TABLET | Freq: Two times a day (BID) | ORAL | 3 refills | Status: DC

## 2020-10-16 ENCOUNTER — Encounter: Payer: Self-pay | Admitting: Family Medicine

## 2020-10-16 ENCOUNTER — Ambulatory Visit (INDEPENDENT_AMBULATORY_CARE_PROVIDER_SITE_OTHER): Payer: No Typology Code available for payment source | Admitting: Family Medicine

## 2020-10-16 VITALS — Ht 66.0 in | Wt 165.0 lb

## 2020-10-16 DIAGNOSIS — M6259 Muscle wasting and atrophy, not elsewhere classified, multiple sites: Secondary | ICD-10-CM

## 2020-10-16 DIAGNOSIS — M6250 Muscle wasting and atrophy, not elsewhere classified, unspecified site: Secondary | ICD-10-CM | POA: Diagnosis not present

## 2020-10-16 NOTE — Assessment & Plan Note (Signed)
We will check testosterone level as we may be able to supplement him if it is low.  I do think most of this is related to his liver failure.

## 2020-10-16 NOTE — Progress Notes (Signed)
  Joshua Hubbard - 45 y.o. male MRN [id number]  Date of birth: 11/09/1975    SUBJECTIVE:      Chief Complaint:/ HPI:  Left-sided rib pain.  Was mowing the grass using a lawnmower and leaned over to pick up the leaf blower, felt a pop in his left side.  Attic immediate onset pain.  The next day the pain was worse.  He is concerned that he did something "bad".  He is gradually getting better but still quite painful.  #2.  Some concerns about his daughter who is having some anxiety.  He and his wife are having some conflict about how to manage that 3.  Has been doing his walking regularly.  He is starting to feel better.  Trying to follow a really good diet.  He still concerned about his loss of muscle mass.  He has started using his home gym a little bit more.   OBJECTIVE: Ht 5\' 6"  (1.676 m)   Wt 165 lb (74.8 kg)   BMI 26.63 kg/m   Physical Exam:  Vital signs are reviewed. general: Well-developed male no acute distress Ribs: Mild Tenderness to Palpation Left Mid Ribs in the Posterior Portion of the Axillary Line.  No Bruising Noted Here. ABDOMEN: No ascites noted  ASSESSMENT & PLAN:   See problem based charting & AVS for pt instructions.#1.  Traumatic costochondritis: Discussed.  We will self resolved. #2: Discussed family issues Atrophy of muscle of multiple sites We will check testosterone level as we may be able to supplement him if it is low.  I do think most of this is related to his liver failure.

## 2020-10-23 ENCOUNTER — Other Ambulatory Visit (HOSPITAL_COMMUNITY): Payer: Self-pay

## 2020-10-25 MED FILL — Folic Acid Tab 1 MG: ORAL | 90 days supply | Qty: 90 | Fill #1 | Status: AC

## 2020-10-26 ENCOUNTER — Other Ambulatory Visit (HOSPITAL_COMMUNITY): Payer: Self-pay

## 2020-10-27 ENCOUNTER — Other Ambulatory Visit: Payer: Self-pay | Admitting: *Deleted

## 2020-10-27 NOTE — Addendum Note (Signed)
Addended by: Junius Creamer on: 10/27/2020 03:09 PM   Modules accepted: Orders

## 2020-10-28 ENCOUNTER — Other Ambulatory Visit (HOSPITAL_COMMUNITY): Payer: Self-pay

## 2020-10-28 MED ORDER — INFLUENZA VAC SPLIT QUAD 0.5 ML IM SUSY
0.5000 mL | PREFILLED_SYRINGE | INTRAMUSCULAR | 0 refills | Status: DC
  Filled 2020-10-28: qty 0.5, 1d supply, fill #0

## 2020-10-29 LAB — TESTOSTERONE, FREE, TOTAL, SHBG
Sex Hormone Binding: 99.7 nmol/L — ABNORMAL HIGH (ref 16.5–55.9)
Testosterone, Free: 5.8 pg/mL — ABNORMAL LOW (ref 6.8–21.5)
Testosterone: 533 ng/dL (ref 264–916)

## 2020-10-30 ENCOUNTER — Encounter: Payer: Self-pay | Admitting: Family Medicine

## 2020-10-30 ENCOUNTER — Ambulatory Visit (INDEPENDENT_AMBULATORY_CARE_PROVIDER_SITE_OTHER): Payer: No Typology Code available for payment source | Admitting: Family Medicine

## 2020-10-30 VITALS — Ht 66.0 in | Wt 165.0 lb

## 2020-10-30 DIAGNOSIS — M6259 Muscle wasting and atrophy, not elsewhere classified, multiple sites: Secondary | ICD-10-CM | POA: Diagnosis not present

## 2020-10-30 DIAGNOSIS — K3189 Other diseases of stomach and duodenum: Secondary | ICD-10-CM | POA: Diagnosis not present

## 2020-10-30 DIAGNOSIS — F1019 Alcohol abuse with unspecified alcohol-induced disorder: Secondary | ICD-10-CM | POA: Diagnosis not present

## 2020-10-30 DIAGNOSIS — K766 Portal hypertension: Secondary | ICD-10-CM

## 2020-10-30 NOTE — Assessment & Plan Note (Signed)
Not sure we have a solution for this.  I would not want him to take more than 1 oxycodone per week.  I think journaling is a good idea to see if we can find a trigger.  I would add to his database whether or not he has had adequate or more than adequate fluid because that may be part of this issue.  He agrees.  I will see him back for this in 1 to 2 months or sooner with problems.  We will continue his other medications for cirrhosis unchanged.

## 2020-10-30 NOTE — Assessment & Plan Note (Signed)
History testosterone level was a bit low.  We discussed options.  I would be okay with him trying some testosterone supplementation to see if it improved his overall general feeling of fatigue and possibly prevented further muscle atrophy.  He wants to think about it and will let me know next week.

## 2020-10-30 NOTE — Progress Notes (Signed)
  Joshua Hubbard - 45 y.o. male MRN [id number]  Date of birth: December 08, 1975    SUBJECTIVE:      Chief Complaint:/ HPI:  #1.  Continued problems with esophageal/abdominal intermittent cramping and pain thought to be related to his chronic cirrhosis.  Overall, he is doing better since last January but he still has several days a week that seem to be more like "rocks" then "diamonds".  He states he just has a lot of pain.  It usually present when he wakes up.  He has been journaling to see if he can find a trigger for it and has not yet.  Does not really have any thing that helps the pain.  On 1 or 2 occasions he has taken half of an oxycodone but knows he cannot do that regularly.  #2.  Follow-up muscle atrophy: We had checked his testosterone which was a little low.  He wants to discuss that. 3.  Continues to be abstinent.  Trying to get some regular exercise every day.  On days he has a lot of abdominal pain, his exercise tolerance is significantly decreased.    OBJECTIVE: Ht 5\' 6"  (1.676 m)   Wt 165 lb (74.8 kg)   BMI 26.63 kg/m   Physical Exam:  Vital signs are reviewed. GENERAL: Well-developed male no acute distress MSK: Muscle atrophy noticed notable in the anterior chest and the limbs.   Abdomen: Protuberant, no notable ascites. PSYCH: AxOx4. Good eye contact.. No psychomotor retardation or agitation. Appropriate speech fluency and content. Asks and answers questions appropriately. Mood is congruent.   ASSESSMENT & PLAN:  See problem based charting & AVS for pt instructions. Atrophy of muscle of multiple sites History testosterone level was a bit low.  We discussed options.  I would be okay with him trying some testosterone supplementation to see if it improved his overall general feeling of fatigue and possibly prevented further muscle atrophy.  He wants to think about it and will let me know next week.  Alcohol abuse with unspecified alcohol-induced disorder ([place]) Continues to be  abstinent.  He is Advertising account executive.  Continue current medications.  Portal hypertensive gastropathy (HCC) Not sure we have a solution for this.  I would not want him to take more than 1 oxycodone per week.  I think journaling is a good idea to see if we can find a trigger.  I would add to his database whether or not he has had adequate or more than adequate fluid because that may be part of this issue.  He agrees.  I will see him back for this in 1 to 2 months or sooner with problems.  We will continue his other medications for cirrhosis unchanged.

## 2020-10-30 NOTE — Assessment & Plan Note (Signed)
Continues to be abstinent.  He is Advertising account executive.  Continue current medications.

## 2020-11-12 ENCOUNTER — Other Ambulatory Visit (HOSPITAL_COMMUNITY): Payer: Self-pay

## 2020-11-12 ENCOUNTER — Other Ambulatory Visit: Payer: Self-pay | Admitting: Family Medicine

## 2020-11-12 DIAGNOSIS — D696 Thrombocytopenia, unspecified: Secondary | ICD-10-CM

## 2020-11-12 DIAGNOSIS — K7030 Alcoholic cirrhosis of liver without ascites: Secondary | ICD-10-CM

## 2020-11-12 MED ORDER — TESTOSTERONE CYPIONATE 200 MG/ML IM SOLN
50.0000 mg | INTRAMUSCULAR | 0 refills | Status: DC
  Filled 2020-11-12: qty 4, 84d supply, fill #0
  Filled 2020-11-13: qty 4, 28d supply, fill #0
  Filled 2020-11-17: qty 10, 280d supply, fill #0
  Filled 2020-11-17: qty 1, 28d supply, fill #0
  Filled 2020-12-16: qty 1, 7d supply, fill #1
  Filled 2021-01-27: qty 1, 7d supply, fill #2
  Filled 2021-03-18: qty 1, 7d supply, fill #3

## 2020-11-12 NOTE — Progress Notes (Signed)
Decided to start testosterone therapy.  He is also been feeling more fatigued and having more cold sensitivity in the last week.  We will get some labs.  We will recheck his testosterone after he has been on this 6 to 8 weeks.  He will come in for training for injection therapy.

## 2020-11-13 ENCOUNTER — Other Ambulatory Visit (HOSPITAL_COMMUNITY): Payer: Self-pay

## 2020-11-13 ENCOUNTER — Telehealth: Payer: Self-pay

## 2020-11-13 NOTE — Telephone Encounter (Signed)
Received fax from pharmacy, PA needed on Testosterone. Clinical questions submitted via Cover My Meds. Waiting on response, could take up to 72 hours.  Cover My Meds info: Key: [id number]

## 2020-11-16 ENCOUNTER — Other Ambulatory Visit (HOSPITAL_COMMUNITY): Payer: Self-pay

## 2020-11-16 MED FILL — Pantoprazole Sodium EC Tab 40 MG (Base Equiv): ORAL | 90 days supply | Qty: 180 | Fill #1 | Status: AC

## 2020-11-17 ENCOUNTER — Other Ambulatory Visit (HOSPITAL_COMMUNITY): Payer: Self-pay

## 2020-11-17 NOTE — Telephone Encounter (Signed)
Medication was denied. Insurance is requiring a testosterone, free value of less than 5. I called the pharmacy and cash price is ~13 dollars for a months supply. I contacted patient and he is ok with this.

## 2020-11-18 ENCOUNTER — Other Ambulatory Visit: Payer: Self-pay

## 2020-11-18 ENCOUNTER — Ambulatory Visit: Payer: No Typology Code available for payment source

## 2020-11-18 DIAGNOSIS — Z7189 Other specified counseling: Secondary | ICD-10-CM

## 2020-11-18 NOTE — Progress Notes (Signed)
Patient presents in nurse clinic for subcutaneous injection teaching.   Patient was demonstrated on how to properly draw medication (.42ml) using a fill needle and syringe.   Patient was demonstrated how to give a subcutaneous injection using a subq needle.  All questions were answered and patient was given a bag of supplies.   Patient to contact me with any questions or another round of teaching.

## 2020-11-19 ENCOUNTER — Other Ambulatory Visit: Payer: Self-pay | Admitting: Family Medicine

## 2020-11-19 ENCOUNTER — Other Ambulatory Visit (HOSPITAL_COMMUNITY): Payer: Self-pay

## 2020-11-20 ENCOUNTER — Other Ambulatory Visit (HOSPITAL_COMMUNITY): Payer: Self-pay

## 2020-11-20 MED ORDER — LACTULOSE ENCEPHALOPATHY 10 GM/15ML PO SOLN
30.0000 g | Freq: Two times a day (BID) | ORAL | 11 refills | Status: DC
  Filled 2020-11-20: qty 3311, 37d supply, fill #0
  Filled 2020-11-20: qty 473, 5d supply, fill #0
  Filled 2021-01-04: qty 3784, 42d supply, fill #1
  Filled 2021-03-10: qty 3784, 42d supply, fill #2
  Filled 2021-06-15: qty 3784, 42d supply, fill #3

## 2020-11-23 ENCOUNTER — Other Ambulatory Visit (HOSPITAL_COMMUNITY): Payer: Self-pay

## 2020-12-11 ENCOUNTER — Ambulatory Visit (INDEPENDENT_AMBULATORY_CARE_PROVIDER_SITE_OTHER): Payer: No Typology Code available for payment source | Admitting: Family Medicine

## 2020-12-11 ENCOUNTER — Other Ambulatory Visit (HOSPITAL_COMMUNITY): Payer: Self-pay

## 2020-12-11 VITALS — BP 132/56 | Ht 66.0 in | Wt 165.0 lb

## 2020-12-11 DIAGNOSIS — T148XXA Other injury of unspecified body region, initial encounter: Secondary | ICD-10-CM | POA: Diagnosis not present

## 2020-12-11 MED ORDER — OXYCODONE HCL 15 MG PO TABS
15.0000 mg | ORAL_TABLET | Freq: Two times a day (BID) | ORAL | 0 refills | Status: DC | PRN
  Filled 2020-12-11: qty 15, 8d supply, fill #0

## 2020-12-11 MED ORDER — EPLERENONE 50 MG PO TABS
50.0000 mg | ORAL_TABLET | Freq: Every day | ORAL | 3 refills | Status: DC
  Filled 2020-12-11: qty 90, 90d supply, fill #0
  Filled 2021-04-03: qty 90, 90d supply, fill #1
  Filled 2021-07-07: qty 90, 90d supply, fill #2
  Filled 2021-09-22: qty 90, 90d supply, fill #3

## 2020-12-11 NOTE — Progress Notes (Signed)
  Joshua Hubbard - 45 y.o. male MRN [id number]  Date of birth: [birth year]    SUBJECTIVE:      Chief Complaint:/ HPI:  #1.  Episode of periumbilical bruising.  Does not recall doing anything specific and had no trauma.  Only new thing is that he has been using the abdominal skin for his subcutaneous testosterone injections that are weekly.  The area of bruising was not located near the site of injection.  Bruising has started to resolve. 2.  Occasionally will have days where he just aches all over.  In September I gave him 15 OxyContin and he is used all but 2 of those.  He says it was quite a relief for him to have a few of those on hand for days when he really has a lot of pain.  He has given the prescription to his father so he must ask his dad for the pills.  He said he did this out of an abundance of caution. 3.  Needs refill on eplerenone. 4.  Started the testosterone supplementation.  So far is not seeing any significant improvement in muscle mass.  He may have had some slight improvement in his sensation of feeling cold all the time although this is difficult to determine as the weather outside is significantly colder at this point.    OBJECTIVE: BP (!) 132/56   Ht 5\' 6"  (1.676 m)   Wt 165 lb (74.8 kg)   BMI 26.63 kg/m   Physical Exam:  Vital signs are reviewed. GENERAL: Well-developed male no acute distress abdomen: Small amount of resolving ecchymoses in the periumbilical area.  There is no caput medusa.  The abdomen is soft, positive bowel sounds.  It is not tender.  He has much less abdominal distention then 4 to 5 months ago.  No ascites is noted.  ASSESSMENT & PLAN:  See problem based charting & AVS for pt instructions. No problem-specific Assessment & Plan notes found for this encounter.  Unclear what this bruising is related to.  If it returns, he will let me know otherwise we will just follow him.  He is doing overall very well.  I will agree to give him a few pills of  OxyContin to use for extreme pain.  I applauded his for side and having someone else manage these medicines for him.  He remains abstinent from alcohol.  Says his biggest problem right now is that he wants to eat too many sweets.  I will follow him up in 2 months, sooner with problems.

## 2020-12-15 ENCOUNTER — Other Ambulatory Visit (INDEPENDENT_AMBULATORY_CARE_PROVIDER_SITE_OTHER): Payer: No Typology Code available for payment source

## 2020-12-15 ENCOUNTER — Encounter: Payer: Self-pay | Admitting: Internal Medicine

## 2020-12-15 ENCOUNTER — Ambulatory Visit (INDEPENDENT_AMBULATORY_CARE_PROVIDER_SITE_OTHER): Payer: No Typology Code available for payment source | Admitting: Sports Medicine

## 2020-12-15 ENCOUNTER — Ambulatory Visit (INDEPENDENT_AMBULATORY_CARE_PROVIDER_SITE_OTHER): Payer: No Typology Code available for payment source | Admitting: Internal Medicine

## 2020-12-15 VITALS — BP 100/60 | HR 60 | Ht 66.0 in | Wt 170.0 lb

## 2020-12-15 DIAGNOSIS — M75101 Unspecified rotator cuff tear or rupture of right shoulder, not specified as traumatic: Secondary | ICD-10-CM | POA: Insufficient documentation

## 2020-12-15 DIAGNOSIS — R932 Abnormal findings on diagnostic imaging of liver and biliary tract: Secondary | ICD-10-CM

## 2020-12-15 DIAGNOSIS — K7031 Alcoholic cirrhosis of liver with ascites: Secondary | ICD-10-CM | POA: Diagnosis not present

## 2020-12-15 DIAGNOSIS — N62 Hypertrophy of breast: Secondary | ICD-10-CM | POA: Diagnosis not present

## 2020-12-15 DIAGNOSIS — M791 Myalgia, unspecified site: Secondary | ICD-10-CM

## 2020-12-15 DIAGNOSIS — I8511 Secondary esophageal varices with bleeding: Secondary | ICD-10-CM

## 2020-12-15 DIAGNOSIS — K7682 Hepatic encephalopathy: Secondary | ICD-10-CM

## 2020-12-15 DIAGNOSIS — M6208 Separation of muscle (nontraumatic), other site: Secondary | ICD-10-CM

## 2020-12-15 DIAGNOSIS — S46011A Strain of muscle(s) and tendon(s) of the rotator cuff of right shoulder, initial encounter: Secondary | ICD-10-CM

## 2020-12-15 DIAGNOSIS — K769 Liver disease, unspecified: Secondary | ICD-10-CM

## 2020-12-15 LAB — COMPREHENSIVE METABOLIC PANEL
ALT: 29 U/L (ref 0–53)
AST: 46 U/L — ABNORMAL HIGH (ref 0–37)
Albumin: 3.5 g/dL (ref 3.5–5.2)
Alkaline Phosphatase: 97 U/L (ref 39–117)
BUN: 8 mg/dL (ref 6–23)
CO2: 28 mEq/L (ref 19–32)
Calcium: 9.1 mg/dL (ref 8.4–10.5)
Chloride: 103 mEq/L (ref 96–112)
Creatinine, Ser: 0.67 mg/dL (ref 0.40–1.50)
GFR: 111.94 mL/min (ref >60.00)
Glucose, Bld: 73 mg/dL (ref 70–99)
Potassium: 3.9 mEq/L (ref 3.5–5.1)
Sodium: 139 mEq/L (ref 135–145)
Total Bilirubin: 1.6 mg/dL — ABNORMAL HIGH (ref 0.2–1.2)
Total Protein: 7.2 g/dL (ref 6.0–8.3)

## 2020-12-15 LAB — CBC WITH DIFFERENTIAL/PLATELET
Basophils Absolute: 0.1 10*3/uL (ref 0.0–0.1)
Basophils Relative: 2 % (ref 0.0–3.0)
Eosinophils Absolute: 0.2 10*3/uL (ref 0.0–0.7)
Eosinophils Relative: 5.5 % — ABNORMAL HIGH (ref 0.0–5.0)
HCT: 33.8 % — ABNORMAL LOW (ref 39.0–52.0)
Hemoglobin: 10.6 g/dL — ABNORMAL LOW (ref 13.0–17.0)
Lymphocytes Relative: 25.7 % (ref 12.0–46.0)
Lymphs Abs: 0.8 10*3/uL (ref 0.7–4.0)
MCHC: 31.3 g/dL (ref 30.0–36.0)
MCV: 75.2 fl — ABNORMAL LOW (ref 78.0–100.0)
Monocytes Absolute: 0.6 10*3/uL (ref 0.1–1.0)
Monocytes Relative: 20.9 % — ABNORMAL HIGH (ref 3.0–12.0)
Neutro Abs: 1.4 10*3/uL (ref 1.4–7.7)
Neutrophils Relative %: 45.9 % (ref 43.0–77.0)
Platelets: 90 10*3/uL — ABNORMAL LOW (ref 150.0–400.0)
RBC: 4.49 Mil/uL (ref 4.22–5.81)
RDW: 26.6 % — ABNORMAL HIGH (ref 11.5–15.5)
WBC: 3 10*3/uL — ABNORMAL LOW (ref 4.0–10.5)

## 2020-12-15 LAB — MAGNESIUM: Magnesium: 1.5 mg/dL (ref 1.5–2.5)

## 2020-12-15 LAB — FERRITIN: Ferritin: 15.1 ng/mL — ABNORMAL LOW (ref 22.0–322.0)

## 2020-12-15 LAB — PROTIME-INR
INR: 1.6 ratio — ABNORMAL HIGH (ref 0.8–1.0)
Prothrombin Time: 16.9 s — ABNORMAL HIGH (ref 9.6–13.1)

## 2020-12-15 NOTE — Patient Instructions (Signed)
Your provider has requested that you go to the basement level for lab work before leaving today. Press "B" on the elevator. The lab is located at the first door on the left as you exit the elevator.  Due to recent changes in healthcare laws, you may see the results of your imaging and laboratory studies on MyChart before your provider has had a chance to review them.  We understand that in some cases there may be results that are confusing or concerning to you. Not all laboratory results come back in the same time frame and the provider may be waiting for multiple results in order to interpret others.  Please give [place] 48 hours in order for your provider to thoroughly review all the results before contacting the office for clarification of your results.   You have been scheduled for an MRI at [facility] on 01/02/21. Your appointment time is 1:00pm. Please arrive at [id number] make certain not to have anything to eat or drink 6 hours prior to your test. In addition, if you have any metal in your body, have a pacemaker or defibrillator, please be sure to let your ordering physician know. This test typically takes 45 minutes to 1 hour to complete. Should you need to reschedule, please call [phone] to do so.  I appreciate the opportunity to care for you. Silvano Rusk, MD, [facility]

## 2020-12-15 NOTE — Assessment & Plan Note (Signed)
Considering his medical issues I think it would be smart to stick with conservative care Not a candidate for nitroglycerin patches with his low blood pressure Arnica gel 3 times daily Rotator cuff rehab exercises Avoid lifting Reevaluate in 4 to 6 weeks

## 2020-12-15 NOTE — Progress Notes (Signed)
Joshua Hubbard 45 y.o. [birth year] [id number]  Assessment & Plan:   Encounter Diagnoses  Name Primary?   Alcoholic cirrhosis of liver with ascites ([place]) Yes   Secondary esophageal varices with bleeding (HCC)    Drug-induced gynecomastia    Encephalopathy, hepatic    Abnormal MRI, liver    Myalgia    Diastasis recti    Liver disease    Reassessed with the following testing below.  Reassured regarding diastases recti.  I suppose his prior ascites probably stretched out the abdominal wall and contributed.  Informational handout provided.  Gynecomastia from spironolactone is much better.  Check magnesium and total CK to see if there is any relationship to his myalgia complaints.  Continue other therapy as he is.  Further plans pending these results.   Orders Placed This Encounter  Procedures   MR Abdomen W Wo Contrast   CBC with Differential/Platelet   Comprehensive metabolic panel   Protime-INR   Magnesium   Ferritin   CK Total (and CKMB)     CC: [name], MD   Subjective:   Chief Complaint:  HPI 45 year old white man here for second visit with me, met him in August, he has a history of alcoholic cirrhosis with ascites and was seen at [facility] and wants to be followed locally.  He was also seen and treated by the [facility] GI people in the past.  See my note from August 2022 for further details.  Today he is complaining of a lot of aches and pains and feeling fatigued after doing yard work.  He has been started on testosterone supplementation by Dr. Milta Deiters to see if that would help with muscle mass problems.  He cannot eat as much as he used to.  He does not think there is a lot of ascites in his abdomen and there was not any on previous MRI earlier this year, it showed a liver lesion of uncertain etiology and the plan was to have him obtain discs from [facility] so we can get comparison.  That has not happened.  I think [facility] told him he needed a release of information for that.  He also  recently had some periumbilical bruising that was of unclear etiology and spontaneously resolved.  I believe he continues to be abstinent.   EGD 10/13/2020 grade 1 varices maximum size 3 mm, portal hypertensive gastropathy also plan to repeat in 1 year  Wt Readings from Last 3 Encounters:  12/15/20 170 lb (77.1 kg)  12/11/20 165 lb (74.8 kg)  10/30/20 165 lb (74.8 kg)    Allergies  Allergen Reactions   Spironolactone Other (See Comments)    Gynecomastia    Tramadol     Made him feel hot and shakey   Current Meds  Medication Sig   eplerenone (INSPRA) 50 MG tablet Take 1 tablet (50 mg total) by mouth daily.   folic acid (FOLVITE) 1 MG tablet TAKE 1 TABLET (1 MG TOTAL) BY MOUTH DAILY.   furosemide (LASIX) 20 MG tablet TAKE 1 TABLET (20 MG TOTAL) BY MOUTH DAILY.   influenza vac split quadrivalent PF (FLUARIX) 0.5 ML injection Inject 0.5 mLs into the muscle.   lactulose, encephalopathy, (GENERLAC) 10 GM/15ML SOLN Take 45 mLs (30 g total) by mouth 2 (two) times daily.   Multiple Vitamin (MULTIVITAMIN WITH MINERALS) TABS tablet Take 1 tablet by mouth daily.   oxyCODONE (ROXICODONE) 15 MG immediate release tablet Take 1 tablet (15 mg total) by mouth 2 (two) times  daily as needed for muskoskeletal pain   pantoprazole (PROTONIX) 40 MG tablet TAKE 1 TABLET BY MOUTH 2 TIMES A DAY   propranolol (INDERAL) 10 MG tablet TAKE 1 TABLET (10 MG TOTAL) BY MOUTH 2 (TWO) TIMES DAILY.   rifaximin (XIFAXAN) 550 MG TABS tablet Take 1 tablet (550 mg total) by mouth 2 (two) times daily.   testosterone cypionate (DEPOTESTOSTERONE CYPIONATE) 200 MG/ML injection Inject 0.25 mLs (50 mg total) into the skin once a week.   Past Medical History:  Diagnosis Date   Arthritis    Blood transfusion without reported diagnosis    recevied 4 units PRBC and FFP during hospitalization from 1/17-1/22/21   Cirrhosis ([place])    Hypertension    PONV (postoperative nausea and vomiting)    Tears of meniscus and ACL of left knee  03/19/2018   Past Surgical History:  Procedure Laterality Date   ARTHROSCOPIC REPAIR ACL     x2   BIOPSY  10/05/2018   Procedure: BIOPSY;  Surgeon: Ronald Lobo, MD;  Location: [facility];  Service: Endoscopy;;   ESOPHAGEAL BANDING  02/12/2019   Procedure: ESOPHAGEAL BANDING;  Surgeon: [name], MD;  Location: [place] ENDOSCOPY;  Service: Gastroenterology;;   ESOPHAGEAL BANDING  12/15/2019   Procedure: ESOPHAGEAL BANDING;  Surgeon: Clarene Essex, MD;  Location: [place];  Service: Endoscopy;;   ESOPHAGOGASTRODUODENOSCOPY (EGD) WITH PROPOFOL N/A 10/05/2018   Procedure: ESOPHAGOGASTRODUODENOSCOPY (EGD) WITH PROPOFOL;  Surgeon: Ronald Lobo, MD;  Location: [facility] ENDOSCOPY;  Service: Endoscopy;  Laterality: N/A;   ESOPHAGOGASTRODUODENOSCOPY (EGD) WITH PROPOFOL N/A 02/12/2019   Procedure: ESOPHAGOGASTRODUODENOSCOPY (EGD) WITH PROPOFOL;  Surgeon: [name], MD;  Location: [place];  Service: Gastroenterology;  Laterality: N/A;   ESOPHAGOGASTRODUODENOSCOPY (EGD) WITH PROPOFOL N/A 12/15/2019   Procedure: ESOPHAGOGASTRODUODENOSCOPY (EGD) WITH PROPOFOL;  Surgeon: Clarene Essex, MD;  Location: [place];  Service: Endoscopy;  Laterality: N/A;   ESOPHAGOGASTRODUODENOSCOPY (EGD) WITH PROPOFOL N/A 10/13/2020   Procedure: ESOPHAGOGASTRODUODENOSCOPY (EGD) WITH PROPOFOL;  Surgeon: Gatha Mayer, MD;  Location: [facility] ENDOSCOPY;  Service: Endoscopy;  Laterality: N/A;   HERNIA REPAIR     x3   IR PARACENTESIS  12/20/2019   IR PARACENTESIS  12/25/2019   IR PARACENTESIS  01/07/2020   IR PARACENTESIS  01/10/2020   IR PARACENTESIS  01/15/2020   IR PARACENTESIS  01/21/2020   IR PARACENTESIS  01/29/2020   IR PARACENTESIS  02/18/2020   KNEE ARTHROSCOPY WITH ANTERIOR CRUCIATE LIGAMENT (ACL) REPAIR WITH HAMSTRING GRAFT Left 03/20/2018   Procedure: KNEE ARTHROSCOPY WITH REVESION ANTERIOR CRUCIATE LIGAMENT (ACL) REPAIR WITH  HAMSTRING ALLOGRAFT,;  Surgeon: Elsie Saas, MD;  Location: [place];  Service: Orthopedics;  Laterality: Left;   KNEE ARTHROSCOPY WITH LATERAL MENISECTOMY Left 03/20/2018   Procedure: KNEE ARTHROSCOPY WITH LATERAL MENISECTOMY;  Surgeon: Elsie Saas, MD;  Location: [place];  Service: Orthopedics;  Laterality: Left;   KNEE ARTHROSCOPY WITH MEDIAL MENISECTOMY Left 03/20/2018   Procedure: KNEE ARTHROSCOPY WITH MEDIAL MENISECTOMY;  Surgeon: Elsie Saas, MD;  Location: [place];  Service: Orthopedics;  Laterality: Left;   Social History   Social History Narrative   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   Review of Systems See HPI  Objective:   Physical Exam BP 100/60   Pulse 60   Ht 5' 6"  (1.676 m)   Wt 170 lb (77.1 kg)   BMI 27.44 kg/m  WM NAD Lungs cta Cor NL S1S2 no rmg Abd distended, + diastasis recti ? Mild ascites, no hematoma Some muscle wasting Aert and oriented x 3

## 2020-12-15 NOTE — Progress Notes (Signed)
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  Right shoulder exam shows limited range of motion in abduction to about 90 degrees Forward flexion he can get about 120 degrees  internal rotation -back scratch is limited versus the left but at waist level is normal External rotation is normal Empty can is both weak and painful Hawkins test is painful and weak Speeds and Yergason's tests are normal  Ultrasound of right shoulder Bicipital tendon normal in short and long axis Infraspinatus and teres minor tendons are normal Subscapularis tendon is normal with normal movement Distal supraspinatus tendon shows a full-thickness tear with 2 strands that still cross the area of the tear This is seen in 3 different planes  Impression acute probable full-thickness supraspinatus tear  Ultrasound and interpretation by Wolfgang Phoenix. Oneida Alar, MD

## 2020-12-16 ENCOUNTER — Other Ambulatory Visit (HOSPITAL_COMMUNITY): Payer: Self-pay

## 2020-12-17 LAB — CK TOTAL AND CKMB (NOT AT ARMC)
CK, MB: <0.7 ng/mL (ref 0–5.0)
Total CK: 93 U/L (ref 44–196)

## 2020-12-20 ENCOUNTER — Other Ambulatory Visit: Payer: Self-pay | Admitting: Internal Medicine

## 2020-12-20 DIAGNOSIS — D509 Iron deficiency anemia, unspecified: Secondary | ICD-10-CM

## 2020-12-20 DIAGNOSIS — M791 Myalgia, unspecified site: Secondary | ICD-10-CM

## 2020-12-26 MED FILL — Propranolol HCl Tab 10 MG: ORAL | 90 days supply | Qty: 180 | Fill #2 | Status: AC

## 2020-12-28 ENCOUNTER — Other Ambulatory Visit (HOSPITAL_COMMUNITY): Payer: Self-pay

## 2021-01-02 ENCOUNTER — Other Ambulatory Visit: Payer: No Typology Code available for payment source

## 2021-01-04 ENCOUNTER — Other Ambulatory Visit (HOSPITAL_COMMUNITY): Payer: Self-pay

## 2021-01-12 ENCOUNTER — Other Ambulatory Visit: Payer: No Typology Code available for payment source

## 2021-01-12 ENCOUNTER — Other Ambulatory Visit: Payer: Self-pay

## 2021-01-12 ENCOUNTER — Other Ambulatory Visit: Payer: Self-pay | Admitting: Family Medicine

## 2021-01-12 DIAGNOSIS — M6259 Muscle wasting and atrophy, not elsewhere classified, multiple sites: Secondary | ICD-10-CM

## 2021-01-12 DIAGNOSIS — D689 Coagulation defect, unspecified: Secondary | ICD-10-CM

## 2021-01-12 DIAGNOSIS — K7030 Alcoholic cirrhosis of liver without ascites: Secondary | ICD-10-CM

## 2021-01-12 DIAGNOSIS — D696 Thrombocytopenia, unspecified: Secondary | ICD-10-CM

## 2021-01-12 NOTE — Addendum Note (Signed)
Addended by: [place] Pink on: 01/12/2021 04:00 PM   Modules accepted: Orders

## 2021-01-12 NOTE — Addendum Note (Signed)
Addended by: [place] Pink on: 01/12/2021 03:59 PM   Modules accepted: Orders

## 2021-01-14 ENCOUNTER — Other Ambulatory Visit: Payer: Self-pay

## 2021-01-14 ENCOUNTER — Ambulatory Visit
Admission: RE | Admit: 2021-01-14 | Discharge: 2021-01-14 | Disposition: A | Discharge status: Home / Self Care | Payer: No Typology Code available for payment source | Source: Ambulatory Visit | Attending: Internal Medicine | Admitting: Internal Medicine

## 2021-01-14 DIAGNOSIS — R932 Abnormal findings on diagnostic imaging of liver and biliary tract: Secondary | ICD-10-CM

## 2021-01-14 DIAGNOSIS — K769 Liver disease, unspecified: Secondary | ICD-10-CM

## 2021-01-14 LAB — COMPREHENSIVE METABOLIC PANEL
ALT: 31 IU/L (ref 0–44)
AST: 45 IU/L — ABNORMAL HIGH (ref 0–40)
Albumin/Globulin Ratio: 1.1 — ABNORMAL LOW (ref 1.2–2.2)
Albumin: 3.6 g/dL — ABNORMAL LOW (ref 4.0–5.0)
Alkaline Phosphatase: 118 IU/L (ref 44–121)
BUN/Creatinine Ratio: 12 (ref 9–20)
BUN: 8 mg/dL (ref 6–24)
Bilirubin Total: 1.3 mg/dL — ABNORMAL HIGH (ref 0.0–1.2)
CO2: 25 mmol/L (ref 20–29)
Calcium: 9.1 mg/dL (ref 8.7–10.2)
Chloride: 101 mmol/L (ref 96–106)
Creatinine, Ser: 0.65 mg/dL — ABNORMAL LOW (ref 0.76–1.27)
Globulin, Total: 3.2 g/dL (ref 1.5–4.5)
Glucose: 60 mg/dL — ABNORMAL LOW (ref 70–99)
Potassium: 4.1 mmol/L (ref 3.5–5.2)
Sodium: 138 mmol/L (ref 134–144)
Total Protein: 6.8 g/dL (ref 6.0–8.5)
eGFR: 118 mL/min/{1.73_m2} (ref >59)

## 2021-01-14 LAB — TESTOSTERONE, FREE, TOTAL, SHBG
Sex Hormone Binding: 125 nmol/L — ABNORMAL HIGH (ref 16.5–55.9)
Testosterone, Free: 13.6 pg/mL (ref 6.8–21.5)
Testosterone: >1500 ng/dL — ABNORMAL HIGH (ref 264–916)

## 2021-01-14 LAB — CBC
Hematocrit: 36.3 % — ABNORMAL LOW (ref 37.5–51.0)
Hemoglobin: 11.7 g/dL — ABNORMAL LOW (ref 13.0–17.7)
MCH: 25.9 pg — ABNORMAL LOW (ref 26.6–33.0)
MCHC: 32.2 g/dL (ref 31.5–35.7)
MCV: 81 fL (ref 79–97)
Platelets: 90 10*3/uL — CL (ref 150–450)
RBC: 4.51 x10E6/uL (ref 4.14–5.80)
RDW: 21 % — ABNORMAL HIGH (ref 11.6–15.4)
WBC: 3.4 10*3/uL (ref 3.4–10.8)

## 2021-01-14 LAB — PROTIME-INR
INR: 1.3 — ABNORMAL HIGH (ref 0.9–1.2)
Prothrombin Time: 13.8 s — ABNORMAL HIGH (ref 9.1–12.0)

## 2021-01-14 IMAGING — MR MR ABDOMEN WO/W CM
18 series · 48 of 48 positions shown · IV contrast (16 ML MULTIHANCE)
Comparison: MRI abdomen [DATE]

CLINICAL DATA: Liver lesion evaluation.  Hepatic fibrosis.

EXAM:
MRI ABDOMEN WITHOUT AND WITH CONTRAST
TECHNIQUE: Multiplanar multisequence MR imaging of the abdomen was performed
both before and after the administration of intravenous contrast.
CONTRAST:  16mL MULTIHANCE GADOBENATE DIMEGLUMINE 529 MG/ML IV SOLN

[Series 3: T2 · coronal · 5.0mm · 1.56mm/px · 1 of 36 slices shown (1 of 4)]
[im 1/36]
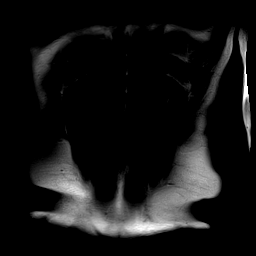

[Series 4: T1 · axial · 3.0mm · 1.25mm/px · z∈[-78,+135]mm · 5 of 144 slices shown]
[im 1/144]
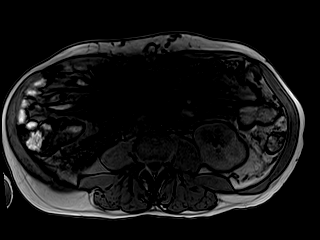
[im 36/144]
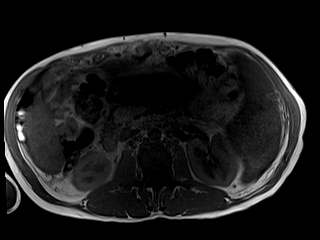
[im 72/144]
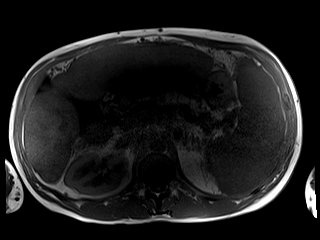
[im 108/144]
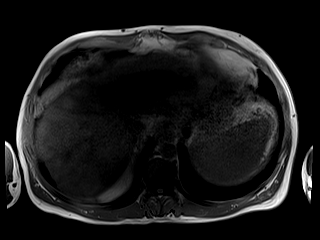
[im 144/144]
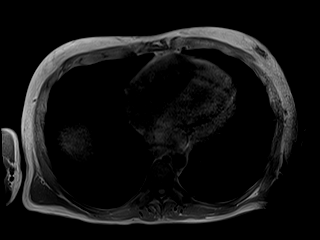

[Series 5: bSSFP · axial · 5.0mm · 1.25mm/px · 1 of 38 slices shown]
[im 1/38]
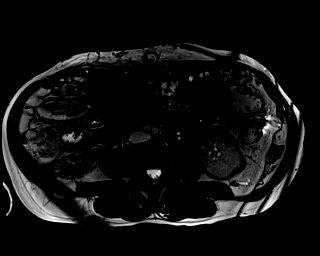

[Series 6: T2 · axial · 5.0mm · 1.48mm/px · z∈[-57,+189]mm · 2 of 42 slices shown (2 of 4)]
[im 1/42]
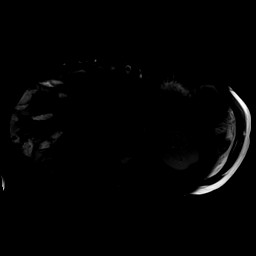
[im 42/42]
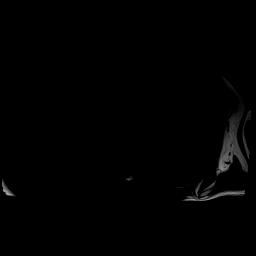

[Series 7: DWI · axial · 5.0mm · 1.42mm/px · z∈[-54,+186]mm · 5 of 123 slices shown (1 of 2)]
[im 1/123]
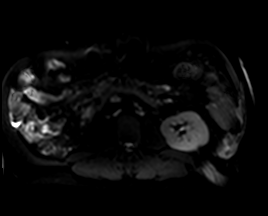
[im 31/123]
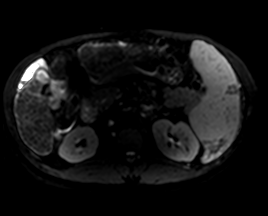
[im 62/123]
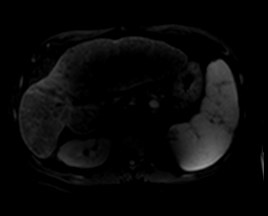
[im 92/123]
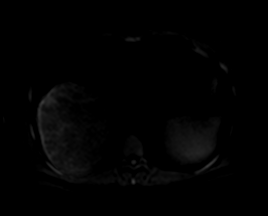
[im 123/123]
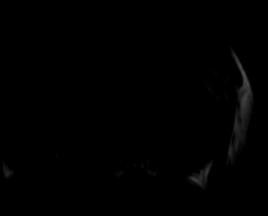

[Series 8: DWI · axial · 5.0mm · 1.42mm/px · z∈[-54,+186]mm · 2 of 41 slices shown (2 of 2)]
[im 1/41]
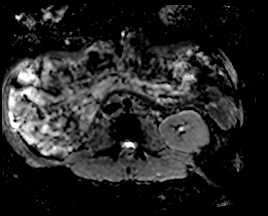
[im 41/41]
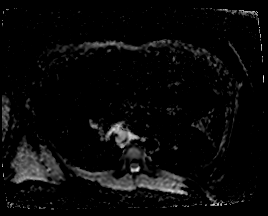

[Series 9: T2 · axial · 6.0mm · 1.19mm/px · 1 of 32 slices shown (3 of 4)]
[im 1/32]
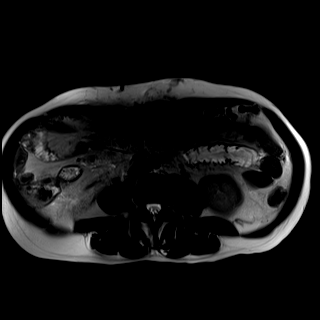

[Series 10: T2 · axial · 6.0mm · 1.19mm/px · 1 of 34 slices shown (4 of 4)]
[im 1/34]
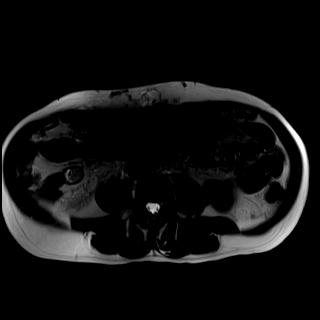

[Series 11: T1 dynamic · axial · non-contrast · 3.0mm · 1.25mm/px · z∈[-90,+147]mm · 3 of 80 slices shown]
[im 1/80]
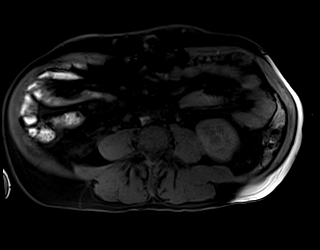
[im 40/80]
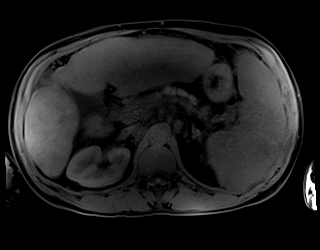
[im 80/80]
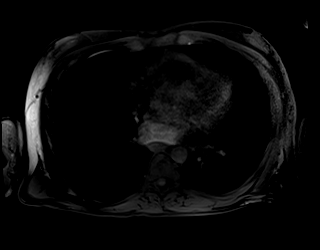

[Series 12: T1 dynamic post-contrast · axial · 3.0mm · 1.25mm/px · z∈[-90,+147]mm · 3 of 80 slices shown (1 of 9)]
[im 1/80]
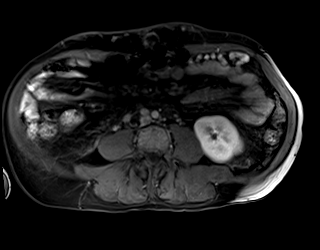
[im 40/80]
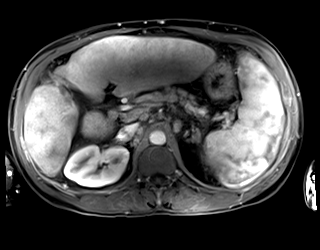
[im 80/80]
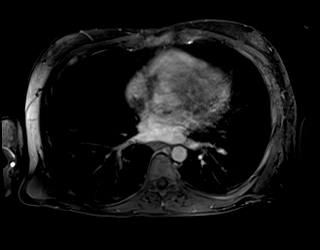

[Series 13: T1 dynamic post-contrast · axial · 3.0mm · 1.25mm/px · z∈[-90,+147]mm · 3 of 80 slices shown (2 of 9)]
[im 1/80]
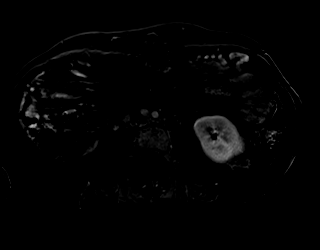
[im 40/80]
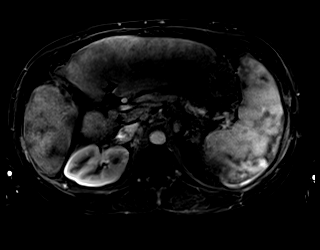
[im 80/80]
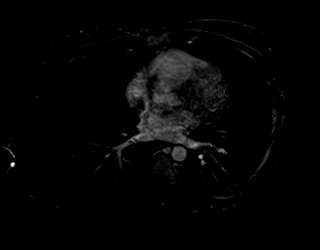

[Series 14: T1 dynamic post-contrast · axial · 3.0mm · 1.25mm/px · z∈[-90,+147]mm · 3 of 80 slices shown (3 of 9)]
[im 1/80]
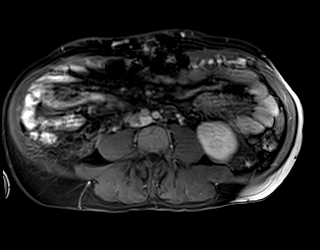
[im 40/80]
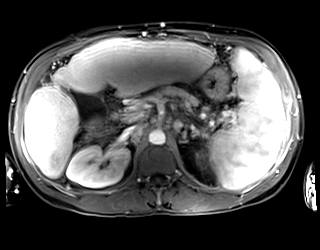
[im 80/80]
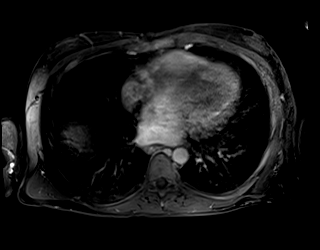

[Series 15: T1 dynamic post-contrast · axial · 3.0mm · 1.25mm/px · z∈[-90,+147]mm · 3 of 80 slices shown (4 of 9)]
[im 1/80]
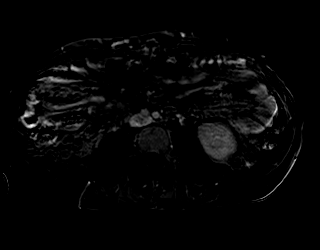
[im 40/80]
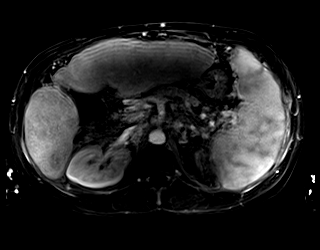
[im 80/80]
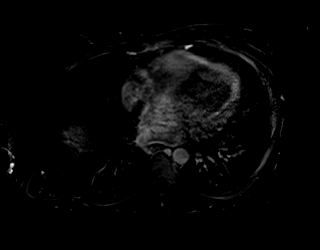

[Series 16: T1 dynamic post-contrast · axial · 3.0mm · 1.25mm/px · z∈[-90,+147]mm · 3 of 80 slices shown (5 of 9)]
[im 1/80]
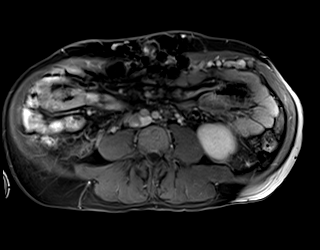
[im 40/80]
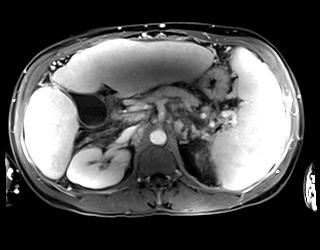
[im 80/80]
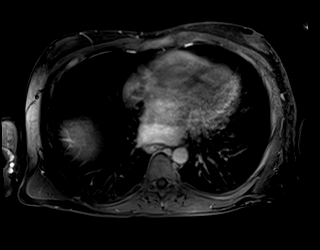

[Series 17: T1 dynamic post-contrast · axial · 3.0mm · 1.25mm/px · z∈[-90,+147]mm · 3 of 80 slices shown (6 of 9)]
[im 1/80]
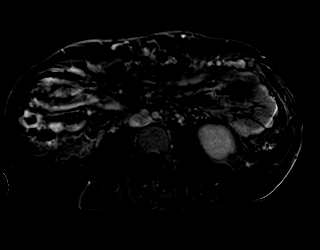
[im 40/80]
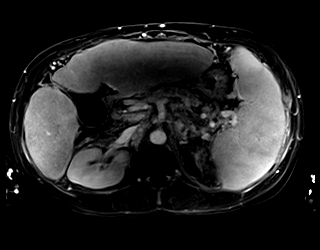
[im 80/80]
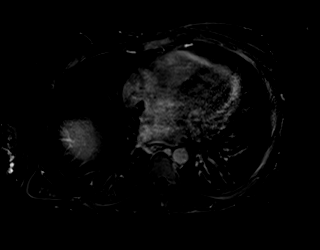

[Series 18: T1 dynamic post-contrast · coronal · 3.0mm · 1.25mm/px · 3 of 72 slices shown (7 of 9)]
[im 1/72]
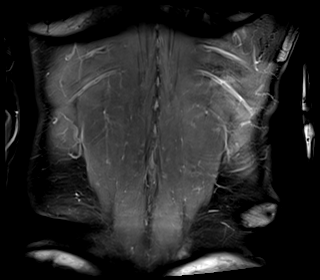
[im 36/72]
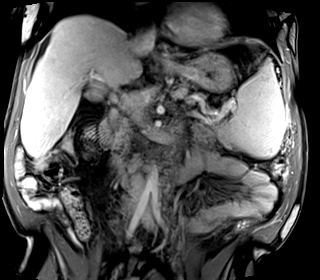
[im 72/72]
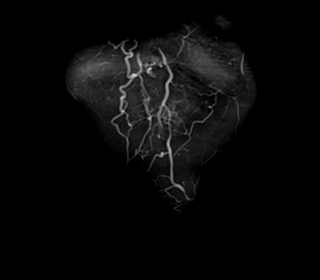

[Series 19: T1 dynamic post-contrast · axial · 3.0mm · 1.25mm/px · z∈[-90,+147]mm · 3 of 80 slices shown (8 of 9)]
[im 1/80]
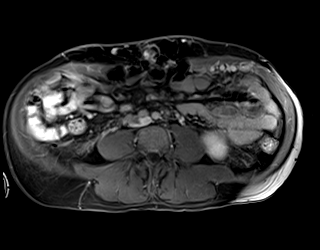
[im 40/80]
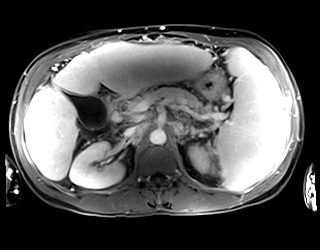
[im 80/80]
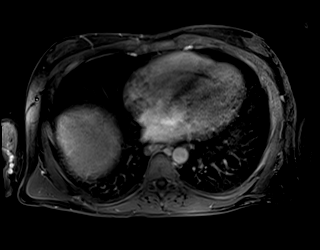

[Series 20: T1 dynamic post-contrast · axial · 3.0mm · 1.25mm/px · z∈[-90,+147]mm · 3 of 80 slices shown (9 of 9)]
[im 1/80]
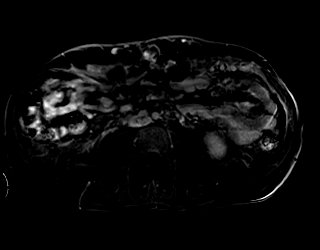
[im 40/80]
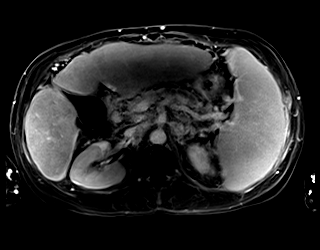
[im 80/80]
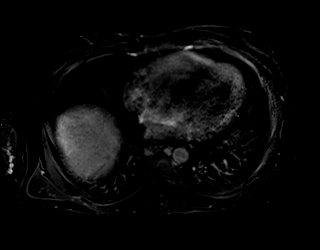

[48 of 48 positions shown; findings below may reference images not displayed]

FINDINGS: Lower chest: No acute findings.

Hepatobiliary: Liver is enlarged and lobulated consistent with
hepatic cirrhosis. Heterogeneity of the parenchyma most
significantly in the right hepatic lobe with irregular relatively
increased T2 signal intensities and inhomogeneous enhancement
consistent with fibrosis. Stable 2.3 cm partially exophytic
hemangioma in the anterior right hepatic lobe near the gallbladder
fossa. The previously described focus of arterial enhancement
centered in the region of right hepatic fibrosis is not appreciated.
No arterial enhancing or delayed washout lesions identified.
Gallbladder appears stable. No biliary ductal dilatation identified.

Pancreas:  No pancreatic mass or ductal dilatation identified.

Spleen: Markedly enlarged with areas of inhomogeneous enhancement
similar to previous study, consistent with portal hypertension.

Adrenals/Urinary Tract: Adrenal glands and kidneys appear within
normal limits.

Stomach/Bowel: No evidence of bowel obstruction.

Vascular/Lymphatic: Numerous varicosities are again seen throughout
the abdomen most prominent in the upper abdomen including
gastroesophageal varices. No bulky lymphadenopathy visualized.

Other:  Small volume ascites, mostly perihepatic.

Musculoskeletal: No suspicious bony lesions identified.
IMPRESSION: 1. Similar appearance of hepatic cirrhosis, and fibrosis. Previously
described focus of arterial enhancement is not visualized. Stable
hepatic hemangioma anteriorly.
2. Evidence of portal hypertension again seen including marked
splenomegaly and numerous varices.
3. Small volume ascites.

## 2021-01-14 MED ORDER — GADOBENATE DIMEGLUMINE 529 MG/ML IV SOLN
16.0000 mL | Freq: Once | INTRAVENOUS | Status: AC | PRN
  Administered 2021-01-14: 15:00:00 16 mL via INTRAVENOUS

## 2021-01-21 ENCOUNTER — Encounter: Payer: Self-pay | Admitting: Family Medicine

## 2021-01-24 MED FILL — Furosemide Tab 20 MG: ORAL | 90 days supply | Qty: 90 | Fill #2 | Status: AC

## 2021-01-25 ENCOUNTER — Other Ambulatory Visit (HOSPITAL_COMMUNITY): Payer: Self-pay

## 2021-01-27 ENCOUNTER — Other Ambulatory Visit (HOSPITAL_COMMUNITY): Payer: Self-pay

## 2021-02-01 ENCOUNTER — Encounter (INDEPENDENT_AMBULATORY_CARE_PROVIDER_SITE_OTHER): Payer: Self-pay

## 2021-02-01 ENCOUNTER — Encounter: Payer: Self-pay | Admitting: Family Medicine

## 2021-02-12 ENCOUNTER — Other Ambulatory Visit (HOSPITAL_COMMUNITY): Payer: Self-pay

## 2021-02-12 ENCOUNTER — Ambulatory Visit (INDEPENDENT_AMBULATORY_CARE_PROVIDER_SITE_OTHER): Payer: 59 | Admitting: Family Medicine

## 2021-02-12 DIAGNOSIS — M6259 Muscle wasting and atrophy, not elsewhere classified, multiple sites: Secondary | ICD-10-CM | POA: Diagnosis not present

## 2021-02-12 DIAGNOSIS — K766 Portal hypertension: Secondary | ICD-10-CM | POA: Diagnosis not present

## 2021-02-12 DIAGNOSIS — T50905A Adverse effect of unspecified drugs, medicaments and biological substances, initial encounter: Secondary | ICD-10-CM | POA: Diagnosis not present

## 2021-02-12 DIAGNOSIS — D689 Coagulation defect, unspecified: Secondary | ICD-10-CM

## 2021-02-12 DIAGNOSIS — N62 Hypertrophy of breast: Secondary | ICD-10-CM

## 2021-02-12 DIAGNOSIS — K3189 Other diseases of stomach and duodenum: Secondary | ICD-10-CM

## 2021-02-12 MED ORDER — PANTOPRAZOLE SODIUM 40 MG PO TBEC
40.0000 mg | DELAYED_RELEASE_TABLET | Freq: Two times a day (BID) | ORAL | 3 refills | Status: DC
Start: 2021-02-12 — End: 2021-06-04
  Filled 2021-02-12: qty 180, 90d supply, fill #0

## 2021-02-12 MED ORDER — OXYCODONE HCL 15 MG PO TABS
15.0000 mg | ORAL_TABLET | Freq: Two times a day (BID) | ORAL | 0 refills | Status: DC | PRN
  Filled 2021-02-12: qty 15, 8d supply, fill #0

## 2021-02-12 MED ORDER — FUROSEMIDE 20 MG PO TABS
20.0000 mg | ORAL_TABLET | Freq: Every day | ORAL | 3 refills | Status: DC
  Filled 2021-02-12 – 2021-07-07 (×2): qty 90, 90d supply, fill #0
  Filled 2021-11-26: qty 90, 90d supply, fill #1

## 2021-02-14 ENCOUNTER — Encounter: Payer: Self-pay | Admitting: Family Medicine

## 2021-02-14 NOTE — Assessment & Plan Note (Signed)
Somewhat improved. Will continue testosterone replacement, physical activity. Hemoglobin also improved

## 2021-02-14 NOTE — Assessment & Plan Note (Signed)
Currently stable with no signs of bleed. Continue the current meds including eplerenone, lasix.

## 2021-02-14 NOTE — Progress Notes (Signed)
°  Joshua Hubbard - 46 y.o. male MRN [id number]  Date of birth: May 28, 1975    SUBJECTIVE:      Chief Complaint:/ HPI:   1. Muscle mass atrophy: seems to feel somewhat stronger with testosterone replacement,  energy is also some better but not what he would like. 2. Myalgias many days, sometimes to the point where is]t is painful to get out of bed, but forces himself anyway. Not many things seem to help. Really bad days about 2 times a week which is better than 6 months ago. 3. ETOH: continues to be abstinent. 4. Activity: continues to work full time and has some days where fatigue curtails activities. After a long day of yard work, which is his hobby, he feels mentally much better.Has used the 15 pills of oxycodone sparingly since October and still has a few left; wants to know if it is Ok to get refill for days such as this, as he thinks it will help him continue to be active.    OBJECTIVE: BP 128/68    Ht 5\' 6"  (1.676 m)    Wt 168 lb (76.2 kg)    BMI 27.12 kg/m   Physical Exam:  Vital signs are reviewed. GEN WD NAD Muscle mass_ seems to be improved by visual inspection, especially upper arm and pectoral area. Still some atrophy. Actual strength 5/5 shoulders and UE symmetrically. PSYCH: AxOx4. Good eye contact.. No psychomotor retardation or agitation. Appropriate speech fluency and content. Asks and answers questions appropriately. Mood is congruent.   ASSESSMENT & PLAN:  See problem based charting & AVS for pt instructions. Atrophy of muscle of multiple sites Somewhat improved. Will continue testosterone replacement, physical activity. Hemoglobin also improved  Portal hypertensive gastropathy (HCC) Currently stable with no signs of bleed. Continue the current meds including eplerenone, lasix.   Drug-induced gynecomastia Not progressing; stable and continue eplerenone vs spironolactone.  Coagulopathy ([facility]) Slight improvement in platelets, still thrombocytopenic but gradually  increasing. Will continue to follow.

## 2021-02-14 NOTE — Assessment & Plan Note (Signed)
Not progressing; stable and continue eplerenone vs spironolactone.

## 2021-02-14 NOTE — Assessment & Plan Note (Signed)
Slight improvement in platelets, still thrombocytopenic but gradually increasing. Will continue to follow.

## 2021-02-16 MED FILL — Folic Acid Tab 1 MG: ORAL | 90 days supply | Qty: 90 | Fill #2 | Status: CN

## 2021-02-17 ENCOUNTER — Other Ambulatory Visit (HOSPITAL_COMMUNITY): Payer: Self-pay

## 2021-02-18 ENCOUNTER — Telehealth: Payer: Self-pay

## 2021-02-18 NOTE — Telephone Encounter (Signed)
Pt called in to discuss lab work & was educated that he could come in at anytime during the week between 7:30-5pm. No further questions.

## 2021-02-25 ENCOUNTER — Other Ambulatory Visit (HOSPITAL_COMMUNITY): Payer: Self-pay

## 2021-03-02 ENCOUNTER — Other Ambulatory Visit: Payer: Self-pay | Admitting: Internal Medicine

## 2021-03-02 ENCOUNTER — Other Ambulatory Visit (INDEPENDENT_AMBULATORY_CARE_PROVIDER_SITE_OTHER): Payer: 59

## 2021-03-02 DIAGNOSIS — D509 Iron deficiency anemia, unspecified: Secondary | ICD-10-CM

## 2021-03-02 LAB — CBC WITH DIFFERENTIAL/PLATELET
Basophils Absolute: 0 10*3/uL (ref 0.0–0.1)
Basophils Relative: 1.2 % (ref 0.0–3.0)
Eosinophils Absolute: 0.2 10*3/uL (ref 0.0–0.7)
Eosinophils Relative: 5.5 % — ABNORMAL HIGH (ref 0.0–5.0)
HCT: 39.3 % (ref 39.0–52.0)
Hemoglobin: 12.8 g/dL — ABNORMAL LOW (ref 13.0–17.0)
Lymphocytes Relative: 18.1 % (ref 12.0–46.0)
Lymphs Abs: 0.6 10*3/uL — ABNORMAL LOW (ref 0.7–4.0)
MCHC: 32.7 g/dL (ref 30.0–36.0)
MCV: 90.3 fl (ref 78.0–100.0)
Monocytes Absolute: 0.5 10*3/uL (ref 0.1–1.0)
Monocytes Relative: 15.7 % — ABNORMAL HIGH (ref 3.0–12.0)
Neutro Abs: 1.9 10*3/uL (ref 1.4–7.7)
Neutrophils Relative %: 59.5 % (ref 43.0–77.0)
Platelets: 85 10*3/uL — ABNORMAL LOW (ref 150.0–400.0)
RBC: 4.35 Mil/uL (ref 4.22–5.81)
RDW: 18.9 % — ABNORMAL HIGH (ref 11.5–15.5)
WBC: 3.2 10*3/uL — ABNORMAL LOW (ref 4.0–10.5)

## 2021-03-02 LAB — FERRITIN: Ferritin: 22.7 ng/mL (ref 22.0–322.0)

## 2021-03-08 ENCOUNTER — Other Ambulatory Visit: Payer: Self-pay

## 2021-03-08 DIAGNOSIS — D509 Iron deficiency anemia, unspecified: Secondary | ICD-10-CM

## 2021-03-08 DIAGNOSIS — K7031 Alcoholic cirrhosis of liver with ascites: Secondary | ICD-10-CM

## 2021-03-10 ENCOUNTER — Other Ambulatory Visit (HOSPITAL_COMMUNITY): Payer: Self-pay

## 2021-03-12 ENCOUNTER — Ambulatory Visit (INDEPENDENT_AMBULATORY_CARE_PROVIDER_SITE_OTHER): Payer: 59 | Admitting: Family Medicine

## 2021-03-12 VITALS — BP 130/70 | Ht 66.0 in | Wt 168.0 lb

## 2021-03-12 DIAGNOSIS — J301 Allergic rhinitis due to pollen: Secondary | ICD-10-CM | POA: Diagnosis not present

## 2021-03-15 ENCOUNTER — Encounter: Payer: Self-pay | Admitting: Family Medicine

## 2021-03-15 NOTE — Progress Notes (Signed)
°  Joshua Hubbard - 46 y.o. male MRN [id number]  Date of birth: 03-31-1975    SUBJECTIVE:      Chief Complaint:/ HPI:  Allergic rhinitis symptoms.  Itchy eyes.  Scratchy throat.  Has this every year for about a month and a half.  Has previously gotten Kenalog injection which seems to help a lot.  No fever.    OBJECTIVE: BP 130/70    Ht 5\' 6"  (1.676 m)    Wt 168 lb (76.2 kg)    BMI 27.12 kg/m   Physical Exam:  Vital signs are reviewed. HEENT: Conjunctive a slightly injected.  Nasal mucosa boggy.  Oropharynx is slightly erythematous but no exudate neck without lymphadenopathy  ASSESSMENT & PLAN: 1.  Allergic rhinitis flare.  We will try her Kenalog injection and he will start over-the-counter Claritin.  Let me know if this does not improve. See problem based charting & AVS for pt instructions. No problem-specific Assessment & Plan notes found for this encounter.

## 2021-03-18 ENCOUNTER — Other Ambulatory Visit (HOSPITAL_COMMUNITY): Payer: Self-pay

## 2021-04-05 ENCOUNTER — Other Ambulatory Visit (HOSPITAL_COMMUNITY): Payer: Self-pay

## 2021-04-06 ENCOUNTER — Other Ambulatory Visit (HOSPITAL_COMMUNITY): Payer: Self-pay

## 2021-04-12 ENCOUNTER — Other Ambulatory Visit: Payer: Self-pay | Admitting: Family Medicine

## 2021-04-12 MED ORDER — OXYCODONE HCL 15 MG PO TABS: 15.0000 mg | ORAL_TABLET | Freq: Two times a day (BID) | ORAL | 0 refills | Status: DC | PRN

## 2021-04-20 ENCOUNTER — Other Ambulatory Visit (INDEPENDENT_AMBULATORY_CARE_PROVIDER_SITE_OTHER): Payer: 59

## 2021-04-20 DIAGNOSIS — K7031 Alcoholic cirrhosis of liver with ascites: Secondary | ICD-10-CM

## 2021-04-20 DIAGNOSIS — D509 Iron deficiency anemia, unspecified: Secondary | ICD-10-CM

## 2021-04-20 LAB — CBC WITH DIFFERENTIAL/PLATELET
Basophils Absolute: 0.1 10*3/uL (ref 0.0–0.1)
Basophils Relative: 1.3 % (ref 0.0–3.0)
Eosinophils Absolute: 0.2 10*3/uL (ref 0.0–0.7)
Eosinophils Relative: 5.8 % — ABNORMAL HIGH (ref 0.0–5.0)
HCT: 43.5 % (ref 39.0–52.0)
Hemoglobin: 14.6 g/dL (ref 13.0–17.0)
Lymphocytes Relative: 15.7 % (ref 12.0–46.0)
Lymphs Abs: 0.6 10*3/uL — ABNORMAL LOW (ref 0.7–4.0)
MCHC: 33.5 g/dL (ref 30.0–36.0)
MCV: 95 fl (ref 78.0–100.0)
Monocytes Absolute: 0.6 10*3/uL (ref 0.1–1.0)
Monocytes Relative: 15.2 % — ABNORMAL HIGH (ref 3.0–12.0)
Neutro Abs: 2.4 10*3/uL (ref 1.4–7.7)
Neutrophils Relative %: 62 % (ref 43.0–77.0)
Platelets: 85 10*3/uL — ABNORMAL LOW (ref 150.0–400.0)
RBC: 4.58 Mil/uL (ref 4.22–5.81)
RDW: 16.2 % — ABNORMAL HIGH (ref 11.5–15.5)
WBC: 3.9 10*3/uL — ABNORMAL LOW (ref 4.0–10.5)

## 2021-04-20 LAB — COMPREHENSIVE METABOLIC PANEL
ALT: 37 U/L (ref 0–53)
AST: 51 U/L — ABNORMAL HIGH (ref 0–37)
Albumin: 3.8 g/dL (ref 3.5–5.2)
Alkaline Phosphatase: 107 U/L (ref 39–117)
BUN: 7 mg/dL (ref 6–23)
CO2: 28 mEq/L (ref 19–32)
Calcium: 9.5 mg/dL (ref 8.4–10.5)
Chloride: 98 mEq/L (ref 96–112)
Creatinine, Ser: 0.68 mg/dL (ref 0.40–1.50)
GFR: 111.17 mL/min (ref >60.00)
Glucose, Bld: 92 mg/dL (ref 70–99)
Potassium: 4 mEq/L (ref 3.5–5.1)
Sodium: 134 mEq/L — ABNORMAL LOW (ref 135–145)
Total Bilirubin: 3 mg/dL — ABNORMAL HIGH (ref 0.2–1.2)
Total Protein: 7.5 g/dL (ref 6.0–8.3)

## 2021-04-20 LAB — PROTIME-INR
INR: 1.6 ratio — ABNORMAL HIGH (ref 0.8–1.0)
Prothrombin Time: 17.4 s — ABNORMAL HIGH (ref 9.6–13.1)

## 2021-04-20 LAB — FERRITIN: Ferritin: 40.6 ng/mL (ref 22.0–322.0)

## 2021-04-21 ENCOUNTER — Ambulatory Visit (INDEPENDENT_AMBULATORY_CARE_PROVIDER_SITE_OTHER): Payer: 59 | Admitting: Internal Medicine

## 2021-04-21 ENCOUNTER — Encounter: Payer: Self-pay | Admitting: Internal Medicine

## 2021-04-21 VITALS — BP 136/70 | HR 81 | Ht 69.0 in | Wt 166.2 lb

## 2021-04-21 DIAGNOSIS — D509 Iron deficiency anemia, unspecified: Secondary | ICD-10-CM

## 2021-04-21 DIAGNOSIS — R932 Abnormal findings on diagnostic imaging of liver and biliary tract: Secondary | ICD-10-CM | POA: Diagnosis not present

## 2021-04-21 DIAGNOSIS — I8511 Secondary esophageal varices with bleeding: Secondary | ICD-10-CM

## 2021-04-21 DIAGNOSIS — K7031 Alcoholic cirrhosis of liver with ascites: Secondary | ICD-10-CM

## 2021-04-21 DIAGNOSIS — K7682 Hepatic encephalopathy: Secondary | ICD-10-CM

## 2021-04-21 LAB — AFP TUMOR MARKER: AFP-Tumor Marker: 2.8 ng/mL (ref <6.1)

## 2021-04-21 NOTE — Progress Notes (Signed)
? ?Joshua Hubbard 46 y.o. 11-04-1975 [id number] ? ?Assessment & Plan:  ? ?Encounter Diagnoses  ?Name Primary?  ? Alcoholic cirrhosis of liver with ascites ([place]) Yes  ? Iron deficiency anemia, unspecified iron deficiency anemia type   ? Encephalopathy, hepatic   ? Abnormal MRI, liver   ? Secondary esophageal varices with bleeding (HCC)   ? ? ?He is improved/stable.  Current labs and situation show the following calculations for chronic liver disease. ?MELD Na 19 ?Child's B 8 ? ? ?Last EGD September 2022 with small varices plan to repeat September 2023-I will place an earlier recall in the system to try to facilitate on time EGD given the staffing shortages at the hospital ? ?Repeat MRI liver 01/12/2022 given history of abnormal liver lesions ? ? ?He will discuss depressed mood with primary care ? ?Though I do not think he needs to be listed for liver transplant I do think it would be useful to have hepatology see him and make any other recommendations regarding his care.  We did discuss increasing propranolol dose as there is room on blood pressure and pulse but he is fatigued and I decided not to push that. ? ?I understand the rationale for testosterone use but would like hepatology's input on this as well i.e. is this appropriate in cirrhosis. ? ?Follow-up plans with me pending hepatology evaluation.  I do plan to continue follow him and a one-time visit there may be all that is needed though certainly amenable to more regular follow-up at tertiary liver clinic. ? ?CC: [name], MD ? ? ?Subjective:  ? ?Chief Complaint: ? ?HPI ?46 year old white man with a history of alcoholic cirrhosis and ascites, complicated by prior esophageal varices with bleeding, hepatic encephalopathy and abnormal liver MRI with lesions.  Also iron deficiency anemia.  Last seen in November 2022.  Since that time the following results are in: ? ?Lab Results  ?Component Value Date  ? WBC 3.9 (L) 04/20/2021  ? HGB 14.6 04/20/2021  ? HCT  43.5 04/20/2021  ? MCV 95.0 04/20/2021  ? PLT 85.0 (L) 04/20/2021  ? ?Lab Results  ?Component Value Date  ? ALT 37 04/20/2021  ? AST 51 (H) 04/20/2021  ? ALKPHOS 107 04/20/2021  ? BILITOT 3.0 (H) 04/20/2021  ? ?Lab Results  ?Component Value Date  ? INR 1.6 (H) 04/20/2021  ? INR 1.3 (H) 01/12/2021  ? INR 1.6 (H) 12/15/2020  ? ?Lab Results  ?Component Value Date  ? CREATININE 0.68 04/20/2021  ? BUN 7 04/20/2021  ? NA 134 (L) 04/20/2021  ? K 4.0 04/20/2021  ? CL 98 04/20/2021  ? CO2 28 04/20/2021  ? ?Lab Results  ?Component Value Date  ? FERRITIN 40.6 04/20/2021  ? ? ?MRI liver with and without contrast 01/15/2021 ?IMPRESSION: ?1. Similar appearance of hepatic cirrhosis, and fibrosis. Previously ?described focus of arterial enhancement is not visualized. Stable ?hepatic hemangioma anteriorly. ?2. Evidence of portal hypertension again seen including marked ?splenomegaly and numerous varices. ?3. Small volume ascites. ? ?He is on testosterone therapy for sarcopenia, still having myalgias.  He still has some fatigue issues, tolerating days some mildly depressed mood it sounds like with some anhedonia and energy level issues.  Sleep is okay there does not seem to be confusion or issues with encephalopathy. ?Allergies  ?Allergen Reactions  ? Spironolactone Other (See Comments)  ?  Gynecomastia ?  ? Tramadol   ?  Made him feel hot and shakey  ? ?Current Meds  ?Medication  Sig  ? eplerenone (INSPRA) 50 MG tablet Take 1 tablet (50 mg total) by mouth daily.  ? ferrous sulfate 325 (65 FE) MG tablet Take 325 mg by mouth daily.  ? furosemide (LASIX) 20 MG tablet Take 1 tablet (20 mg total) by mouth daily.  ? influenza vac split quadrivalent PF (FLUARIX) 0.5 ML injection Inject 0.5 mLs into the muscle.  ? lactulose, encephalopathy, (GENERLAC) 10 GM/15ML SOLN Take 45 mLs (30 g total) by mouth 2 (two) times daily.  ? Multiple Vitamin (MULTIVITAMIN WITH MINERALS) TABS tablet Take 1 tablet by mouth daily.  ? oxyCODONE (ROXICODONE) 15 MG  immediate release tablet Take 1 tablet (15 mg total) by mouth 2 (two) times daily as needed for muskoskeletal pain  ? pantoprazole (PROTONIX) 40 MG tablet Take 1 tablet (40 mg total) by mouth 2 (two) times daily.  ? rifaximin (XIFAXAN) 550 MG TABS tablet Take 1 tablet (550 mg total) by mouth 2 (two) times daily.  ? testosterone cypionate (DEPOTESTOSTERONE CYPIONATE) 200 MG/ML injection Inject 0.25 mLs (50 mg total) into the skin once a week.  ? ?Past Medical History:  ?Diagnosis Date  ? Arthritis   ? Blood transfusion without reported diagnosis   ? recevied 4 units PRBC and FFP during hospitalization from 1/17-1/22/21  ? Cirrhosis ([place])   ? Hypertension   ? PONV (postoperative nausea and vomiting)   ? Tears of meniscus and ACL of left knee 03/19/2018  ? ?Past Surgical History:  ?Procedure Laterality Date  ? ARTHROSCOPIC REPAIR ACL    ? x2  ? BIOPSY  10/05/2018  ? Procedure: BIOPSY;  Surgeon: Ronald Lobo, MD;  Location: [facility];  Service: Endoscopy;;  ? ESOPHAGEAL BANDING  02/12/2019  ? Procedure: ESOPHAGEAL BANDING;  Surgeon: [name], MD;  Location: [facility];  Service: Gastroenterology;;  ? ESOPHAGEAL BANDING  12/15/2019  ? Procedure: ESOPHAGEAL BANDING;  Surgeon: Clarene Essex, MD;  Location: [facility];  Service: Endoscopy;;  ? ESOPHAGOGASTRODUODENOSCOPY (EGD) WITH PROPOFOL N/A 10/05/2018  ? Procedure: ESOPHAGOGASTRODUODENOSCOPY (EGD) WITH PROPOFOL;  Surgeon: Ronald Lobo, MD;  Location: [facility];  Service: Endoscopy;  Laterality: N/A;  ? ESOPHAGOGASTRODUODENOSCOPY (EGD) WITH PROPOFOL N/A 02/12/2019  ? Procedure: ESOPHAGOGASTRODUODENOSCOPY (EGD) WITH PROPOFOL;  Surgeon: [name], MD;  Location: [facility];  Service: Gastroenterology;  Laterality: N/A;  ? ESOPHAGOGASTRODUODENOSCOPY (EGD) WITH PROPOFOL N/A 12/15/2019  ? Procedure: ESOPHAGOGASTRODUODENOSCOPY (EGD) WITH PROPOFOL;  Surgeon: Clarene Essex, MD;  Location: [facility];  Service: Endoscopy;  Laterality: N/A;  ?  ESOPHAGOGASTRODUODENOSCOPY (EGD) WITH PROPOFOL N/A 10/13/2020  ? Procedure: ESOPHAGOGASTRODUODENOSCOPY (EGD) WITH PROPOFOL;  Surgeon: Gatha Mayer, MD;  Location: [facility];  Service: Endoscopy;  Laterality: N/A;  ? HERNIA REPAIR    ? x3  ? IR PARACENTESIS  12/20/2019  ? IR PARACENTESIS  12/25/2019  ? IR PARACENTESIS  01/07/2020  ? IR PARACENTESIS  01/10/2020  ? IR PARACENTESIS  01/15/2020  ? IR PARACENTESIS  01/21/2020  ? IR PARACENTESIS  01/29/2020  ? IR PARACENTESIS  02/18/2020  ? KNEE ARTHROSCOPY WITH ANTERIOR CRUCIATE LIGAMENT (ACL) REPAIR WITH HAMSTRING GRAFT Left 03/20/2018  ? Procedure: KNEE ARTHROSCOPY WITH REVESION ANTERIOR CRUCIATE LIGAMENT (ACL) REPAIR WITH  HAMSTRING ALLOGRAFT,;  Surgeon: Elsie Saas, MD;  Location: [facility];  Service: Orthopedics;  Laterality: Left;  ? KNEE ARTHROSCOPY WITH LATERAL MENISECTOMY Left 03/20/2018  ? Procedure: KNEE ARTHROSCOPY WITH LATERAL MENISECTOMY;  Surgeon: Elsie Saas, MD;  Location: [facility];  Service: Orthopedics;  Laterality: Left;  ? KNEE ARTHROSCOPY WITH MEDIAL  MENISECTOMY Left 03/20/2018  ? Procedure: KNEE ARTHROSCOPY WITH MEDIAL MENISECTOMY;  Surgeon: Elsie Saas, MD;  Location: [facility];  Service: Orthopedics;  Laterality: Left;  ? ?Social History  ? ?Social History Narrative  ? 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 He previously taught elementary school math for 16 years  ? He lives in [place] with his wife and 2 daughters born 2007 and 2010  ? Former heavy alcohol user none in 2022 since late 2021  ? 1 caffeinated beverage daily  ? Never smoker no other tobacco or drug use  ? ?family history includes Coronary artery disease in his father; Hyperlipidemia in his father; Osteoarthritis in his mother. ? ? ?Review of Systems ?As per HPI ? ?Objective:  ? Physical Exam ?'@BP'$  136/70   Pulse 81   Ht '5\' 9"'$  (1.753 m)   Wt 166 lb 3.2 oz (75.4 kg)   SpO2 96%   BMI  24.54 kg/m? @ ? ?General:  NAD ?Lungs:  clear ?Heart::  S1S2 no rubs, murmurs or gallops ?Abdomen:  soft and nontender, BS+ + diastasis recti, mild ascites ?Ext:   no edema, cyanosis or clubbing ?Neuro:   Alert

## 2021-04-21 NOTE — Patient Instructions (Addendum)
If you are age 47 or older, your body mass index should be between 23-30. Your Body mass index is 24.54 kg/m?Joshua Hubbard If this is out of the aforementioned range listed, please consider follow up with your Primary Care Provider. ? ?If you are age 37 or younger, your body mass index should be between 19-25. Your Body mass index is 24.54 kg/m?Joshua Hubbard If this is out of the aformentioned range listed, please consider follow up with your Primary Care Provider.  ? ?________________________________________________________ ? ?The [facility] providers would like to encourage you to use [facility] to communicate with providers for non-urgent requests or questions.  Due to long hold times on the telephone, sending your provider a message by [facility] may be a faster and more efficient way to get a response.  Please allow 48 business hours for a response.  Please remember that this is for non-urgent requests.  ?_______________________________________________________ ? ?Continue off your medicines per Dr Carlean Purl. ? ?We are placing a referral to [facility] to see Roosevelt Locks, NP. They are located at [address] [place] [address], phone # [phone]. ? ?I appreciate the opportunity to care for you. ?Silvano Rusk, MD, [facility] ?

## 2021-04-22 ENCOUNTER — Other Ambulatory Visit: Payer: Self-pay | Admitting: Family Medicine

## 2021-04-22 MED ORDER — PROPRANOLOL HCL 10 MG PO TABS
10.0000 mg | ORAL_TABLET | Freq: Two times a day (BID) | ORAL | 3 refills | Status: DC
  Filled 2021-04-22: qty 180, 90d supply, fill #0
  Filled 2021-08-02: qty 180, 90d supply, fill #1
  Filled 2021-11-15: qty 180, 90d supply, fill #2

## 2021-04-23 ENCOUNTER — Other Ambulatory Visit (HOSPITAL_COMMUNITY): Payer: Self-pay

## 2021-04-26 ENCOUNTER — Telehealth: Payer: Self-pay

## 2021-04-26 NOTE — Telephone Encounter (Signed)
[name] has been made an appointment with Roosevelt Locks, NP for 05/24/2021 at 1:30pm. Patient aware. ?

## 2021-05-07 ENCOUNTER — Ambulatory Visit: Payer: Self-pay

## 2021-05-07 ENCOUNTER — Ambulatory Visit (INDEPENDENT_AMBULATORY_CARE_PROVIDER_SITE_OTHER): Payer: 59 | Admitting: Family Medicine

## 2021-05-07 VITALS — BP 124/64 | Ht 66.0 in | Wt 168.0 lb

## 2021-05-07 DIAGNOSIS — K7030 Alcoholic cirrhosis of liver without ascites: Secondary | ICD-10-CM | POA: Diagnosis not present

## 2021-05-07 DIAGNOSIS — M25512 Pain in left shoulder: Secondary | ICD-10-CM

## 2021-05-07 DIAGNOSIS — F1019 Alcohol abuse with unspecified alcohol-induced disorder: Secondary | ICD-10-CM | POA: Diagnosis not present

## 2021-05-07 DIAGNOSIS — Z658 Other specified problems related to psychosocial circumstances: Secondary | ICD-10-CM | POA: Diagnosis not present

## 2021-05-07 MED ORDER — BUPROPION HCL ER (XL) 150 MG PO TB24: 150.0000 mg | ORAL_TABLET | Freq: Every day | ORAL | 1 refills | Status: DC

## 2021-05-10 DIAGNOSIS — M25512 Pain in left shoulder: Secondary | ICD-10-CM | POA: Insufficient documentation

## 2021-05-10 NOTE — Assessment & Plan Note (Addendum)
Chronic rotator cuff arthropathy and supraspinatus atrophy. HEP and f/u 4 weeks. ?

## 2021-05-10 NOTE — Assessment & Plan Note (Signed)
Compliant with lactulose and other meds. No bleeding. ?

## 2021-05-10 NOTE — Progress Notes (Signed)
?  Joshua Hubbard - 46 y.o. male MRN [id number]  Date of birth: 1975-07-01 ? ? ? ?SUBJECTIVE:    ?  ?Chief Complaint:/ HPI:  ? 1. One week of feeling more lethargic and depressed. Feels like he suddenly took a big step back and can find no reason for it. No SI/HI. Still abstinent> wants to go back on antidepressant therapy. ? ?2. Joshua Hubbard back of his leg. Was in [place] with family and noticed it then. Getiingbteer but presists no > 2 weeks. Itchy. ? ?3. Left shoulder pain. Feels like his right one did when he had rotator cuff issues. No specific injury. Pain witgh raising above shoulder level ? ? ? ?OBJECTIVE: BP 124/64   Ht '5\' 6"'$  (1.676 m)   Wt 168 lb (76.2 kg)   BMI 27.12 kg/m?   ?Physical Exam:  Vital signs are reviewed. ?PSYCH: AxOx4. Good eye contact.. No psychomotor retardation or agitation. Appropriate speech fluency and content. Asks and answers questions appropriately. Mood is congruent. ?SKIN several drying erythematous areas back of right thigh. No weeping, no unusual warmth. Looks like resolving contatct dermatitis. No sign of bite. No swelling. Not painful. Surrounding skin is normal. ? ?SHOULDER: left. FROm. Pain with supraspinatus testing. Some decreased strength with AROM but partially related to pain. ? ?[place]: left shoulder: Bicep tendon is intact seen well in short and long axis. Some mild fluid in tendon sheath. ?Subscapular muscle has small intrasubstance tear, about 3 mm in diameter (may be scarred). No increased vascularity noted at this area. Supraspinatus muscle is atrophied with poorly defined architecture, no specific tear seen. ?ASSESSMENT & PLAN: ?Dermtitis, resolving. ?See problem based charting & AVS for pt instructions. ?Liver cirrhosis ([place]) ?Compliant with lactulose and other meds. No bleeding. ? ?Alcohol abuse with unspecified alcohol-induced disorder ([place]) ?Continued abstinence ? ?Psychosocial stressors ?Restart buproprion and f/u 2-3 weeks, sooner with any worsening of sx. ? ?

## 2021-05-10 NOTE — Assessment & Plan Note (Signed)
Restart buproprion and f/u 2-3 weeks, sooner with any worsening of sx. ?

## 2021-05-10 NOTE — Assessment & Plan Note (Signed)
Continued abstinence ?

## 2021-05-14 ENCOUNTER — Other Ambulatory Visit (HOSPITAL_COMMUNITY): Payer: Self-pay

## 2021-05-24 DIAGNOSIS — R188 Other ascites: Secondary | ICD-10-CM | POA: Diagnosis not present

## 2021-05-24 DIAGNOSIS — D376 Neoplasm of uncertain behavior of liver, gallbladder and bile ducts: Secondary | ICD-10-CM | POA: Diagnosis not present

## 2021-05-24 DIAGNOSIS — I8511 Secondary esophageal varices with bleeding: Secondary | ICD-10-CM | POA: Diagnosis not present

## 2021-05-24 DIAGNOSIS — K766 Portal hypertension: Secondary | ICD-10-CM | POA: Diagnosis not present

## 2021-05-24 DIAGNOSIS — K7682 Hepatic encephalopathy: Secondary | ICD-10-CM | POA: Diagnosis not present

## 2021-05-24 DIAGNOSIS — K7031 Alcoholic cirrhosis of liver with ascites: Secondary | ICD-10-CM | POA: Diagnosis not present

## 2021-05-24 DIAGNOSIS — K3189 Other diseases of stomach and duodenum: Secondary | ICD-10-CM | POA: Diagnosis not present

## 2021-05-25 ENCOUNTER — Other Ambulatory Visit: Payer: Self-pay | Admitting: Nurse Practitioner

## 2021-05-25 DIAGNOSIS — K7031 Alcoholic cirrhosis of liver with ascites: Secondary | ICD-10-CM

## 2021-06-02 ENCOUNTER — Ambulatory Visit
Admission: RE | Admit: 2021-06-02 | Discharge: 2021-06-02 | Disposition: A | Discharge status: Home / Self Care | Payer: 59 | Source: Ambulatory Visit | Attending: Nurse Practitioner | Admitting: Nurse Practitioner

## 2021-06-02 DIAGNOSIS — K8020 Calculus of gallbladder without cholecystitis without obstruction: Secondary | ICD-10-CM | POA: Diagnosis not present

## 2021-06-02 DIAGNOSIS — K7031 Alcoholic cirrhosis of liver with ascites: Secondary | ICD-10-CM

## 2021-06-02 DIAGNOSIS — R161 Splenomegaly, not elsewhere classified: Secondary | ICD-10-CM | POA: Diagnosis not present

## 2021-06-02 IMAGING — US US ABDOMEN COMPLETE
1 series · 13 of 25 positions shown · non-contrast
Comparison: MR abdomen [DATE]

CLINICAL DATA: History of cirrhosis.

EXAM:
ABDOMEN ULTRASOUND COMPLETE

[Series 1: us abdomen complete · 0.17mm/px · 13 of 88 slices shown]
[im 1/88]
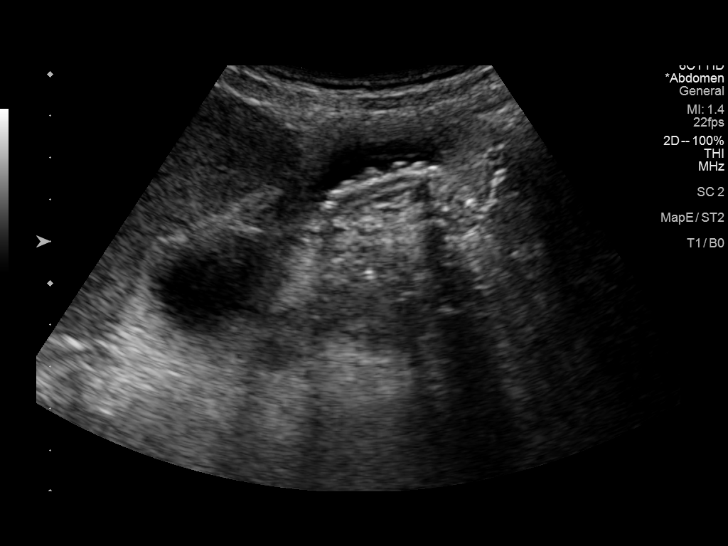
[im 8/88]
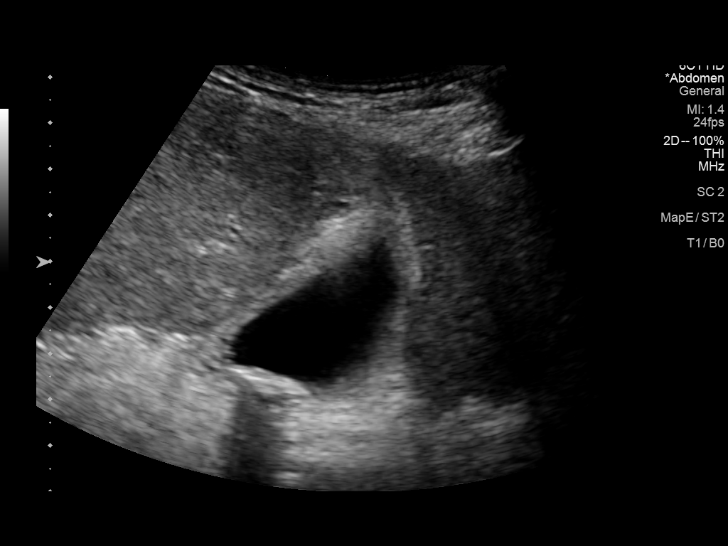
[im 15/88]
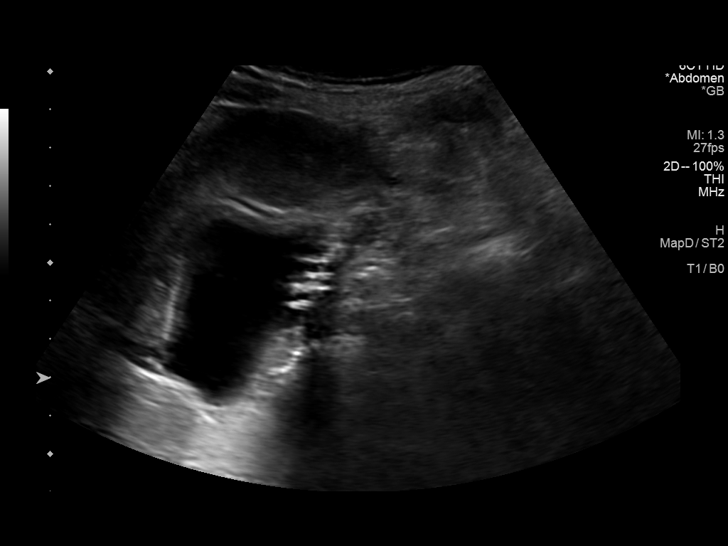
[im 22/88]
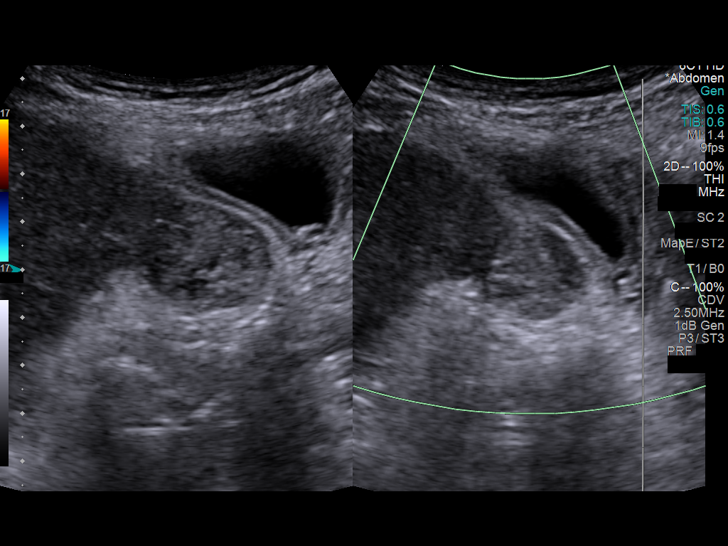
[im 30/88]
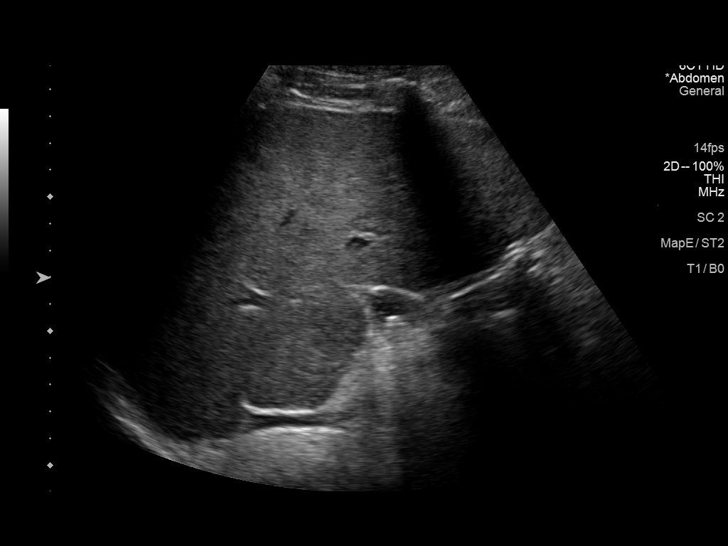
[im 37/88]
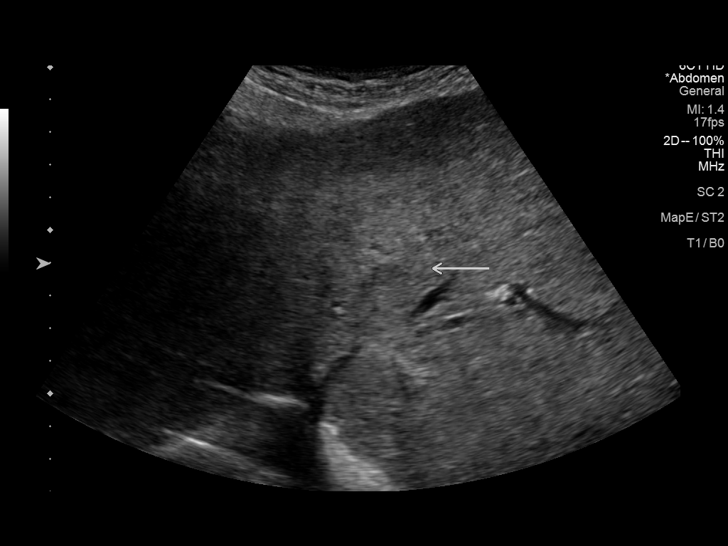
[im 44/88]
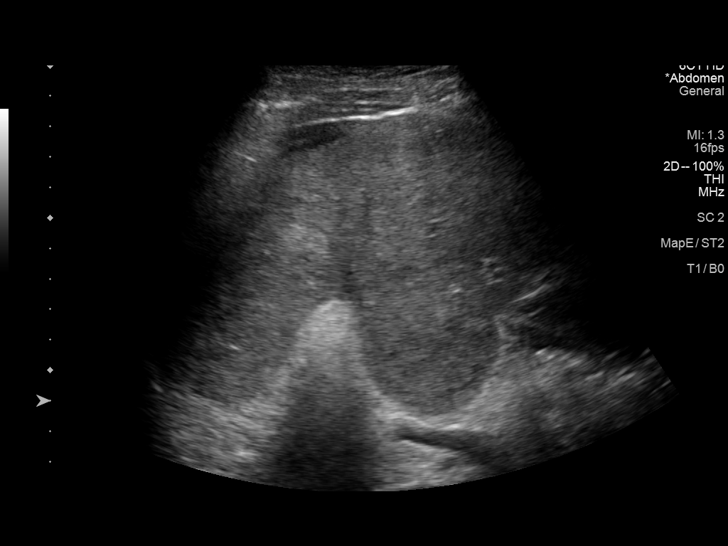
[im 51/88]
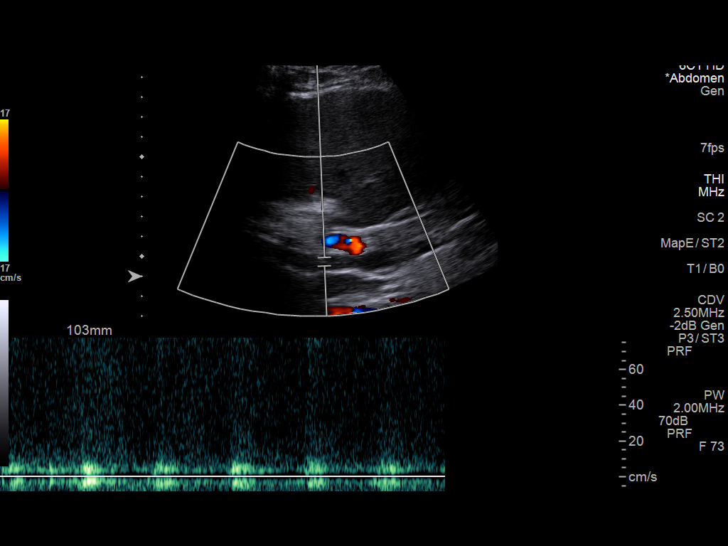
[im 59/88]
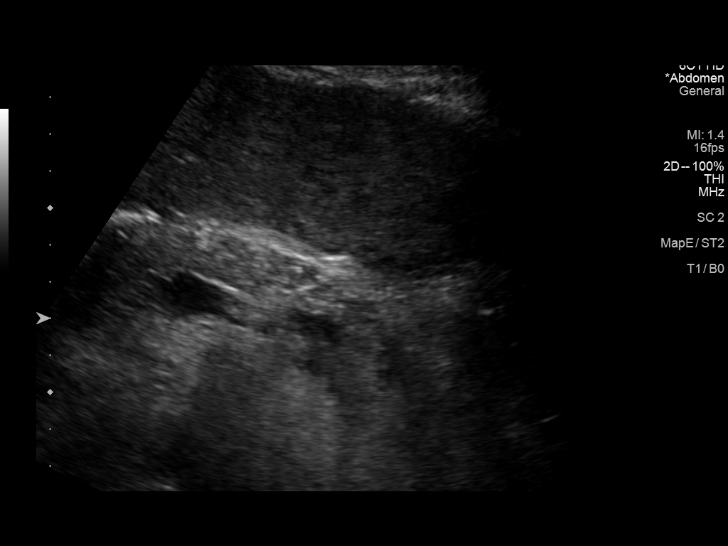
[im 66/88]
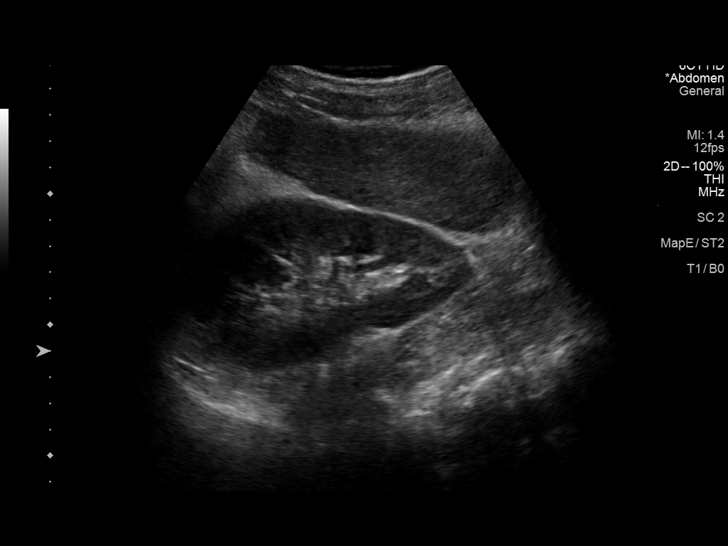
[im 73/88]
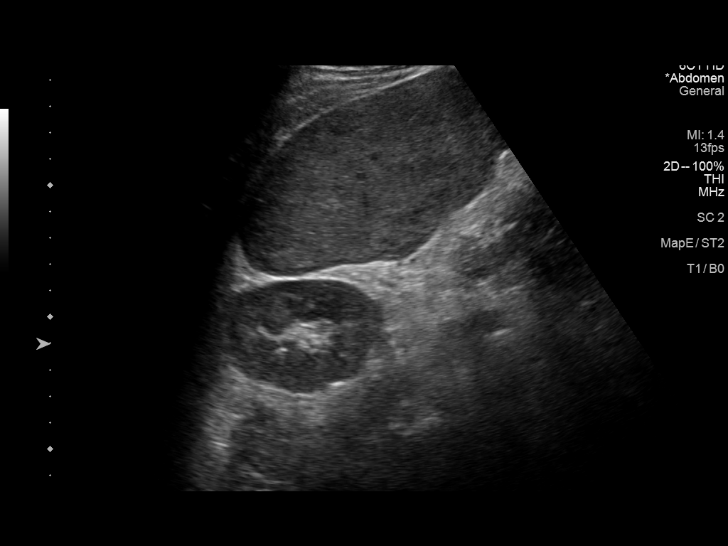
[im 80/88]
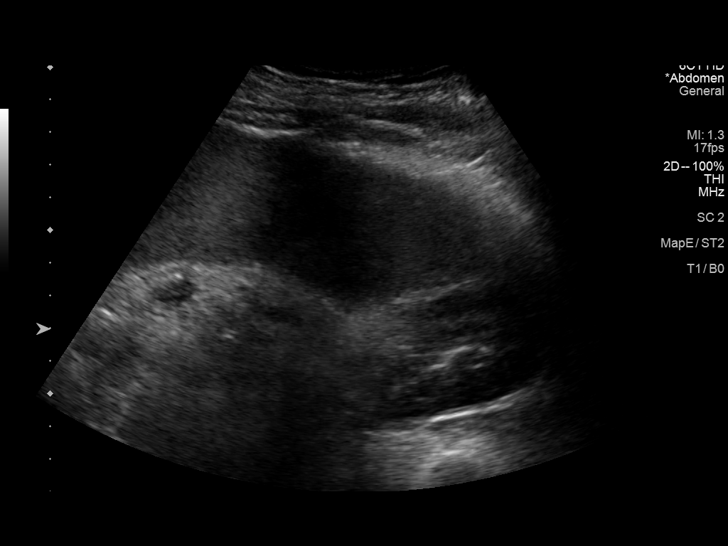
[im 88/88]
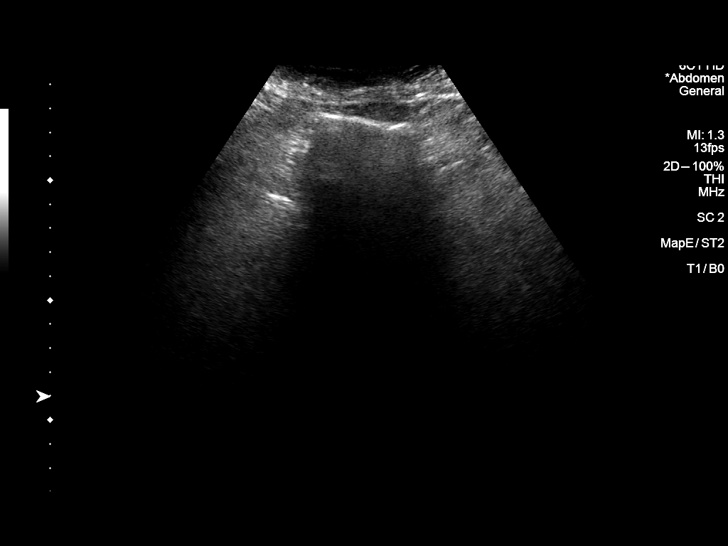

[13 of 25 positions shown; findings below may reference images not displayed]

FINDINGS: Gallbladder: Multiple stones and sludge in the gallbladder lumen.
Mild gallbladder wall thickening.

Common bile duct: Diameter: 3 mm

Liver: Morphologic changes of liver compatible with cirrhosis. In
the region of the right hepatic lobe there is a 2.8 x 2.1 x 2.2 cm
oval isoechoic mass which may represent a true hepatic mass or
adjacent enlarged porta hepatic node. Additionally within the right
hepatic lobe there is a 1.3 x 0.9 x 1.1 cm hypoechoic mass. Mixed
flow is demonstrated within the portal vein.

IVC: No abnormality visualized.

Pancreas: Visualized portion unremarkable.

Spleen: Enlarged measuring 24.5 cm

Right Kidney: Length: 12.0 cm. Echogenicity within normal limits. No
mass or hydronephrosis visualized.

Left Kidney: Length: 14.0 cm. Echogenicity within normal limits. No
mass or hydronephrosis visualized.

Abdominal aorta: Not well visualized.

Other findings: Ascites.
IMPRESSION: 1. Morphologic changes to the liver compatible with cirrhosis. There
is suggestion of 2 masses within the right hepatic lobe measuring
2.8 cm and 1.3 cm. The larger mass measuring 2.8 cm may potentially
represent a porta hepatic node immediately adjacent to the hepatic
parenchyma. Recommend dedicated evaluation with pre and post
contrast-enhanced abdominal MRI.
2. Cholelithiasis.  Mild nonspecific gallbladder wall thickening.
3. Mixed flow within the portal vein.
4. Splenomegaly.
5. These results will be called to the ordering clinician or
representative by the Radiologist Assistant, and communication
documented in the PACS or [REDACTED].

## 2021-06-03 ENCOUNTER — Other Ambulatory Visit: Payer: Self-pay | Admitting: Nurse Practitioner

## 2021-06-03 DIAGNOSIS — D376 Neoplasm of uncertain behavior of liver, gallbladder and bile ducts: Secondary | ICD-10-CM

## 2021-06-04 ENCOUNTER — Ambulatory Visit (INDEPENDENT_AMBULATORY_CARE_PROVIDER_SITE_OTHER): Payer: 59 | Admitting: Family Medicine

## 2021-06-04 VITALS — Ht 66.0 in | Wt 160.0 lb

## 2021-06-04 DIAGNOSIS — K7030 Alcoholic cirrhosis of liver without ascites: Secondary | ICD-10-CM | POA: Diagnosis not present

## 2021-06-04 DIAGNOSIS — F1019 Alcohol abuse with unspecified alcohol-induced disorder: Secondary | ICD-10-CM

## 2021-06-04 DIAGNOSIS — R197 Diarrhea, unspecified: Secondary | ICD-10-CM | POA: Diagnosis not present

## 2021-06-04 MED ORDER — PANTOPRAZOLE SODIUM 40 MG PO TBEC: 40.0000 mg | DELAYED_RELEASE_TABLET | Freq: Two times a day (BID) | ORAL | 3 refills | Status: DC

## 2021-06-04 MED ORDER — OXYCODONE HCL 15 MG PO TABS: 15.0000 mg | ORAL_TABLET | Freq: Two times a day (BID) | ORAL | 0 refills | Status: DC | PRN

## 2021-06-04 NOTE — Assessment & Plan Note (Signed)
They performed Phosphatidylethanol ?And it was negative ?

## 2021-06-04 NOTE — Progress Notes (Signed)
?  Joshua Hubbard - 46 y.o. male MRN [id number]  Date of birth: Dec 07, 1975 ? ? ? ?SUBJECTIVE:    ?  ?Chief Complaint:/ HPI:  ? 1. Diarrhea off and on last 2 weeks. Started right after he went back on buproprion.  Had previously been on that medicine without any of these issues so is not sure it is related.  Thyroid is actually gotten a little worse yesterday and today.  Very loose stools.  No blood.  He is also lost several pounds in his appetite is down.  Has had some crampy pain in the right upper quadrant last weekend when he was fishing.  Got so bad he had to go in the stop fishing for the day.  Since then he has had 2 more episodes. ?#2.  Saw the hepatologist.  Had abdominal ultrasound done and he has some questions about that. ?3.  Continues to have some fatigue.  Initially felt better when he restarted the Wellbutrin but once the diarrhea started he started feeling worse. ? ? ? ?OBJECTIVE: Ht '5\' 6"'$  (1.676 m)   Wt 160 lb (72.6 kg)   BMI 25.82 kg/m?   ?Physical Exam:  Vital signs are reviewed. ?Vital signs reviewed. ?GENERAL: Well-developed, well-nourished, no acute distress. ?CARDIOVASCULAR: Regular rate and rhythm no murmur gallop or rub ?LUNGS: Clear to auscultation bilaterally, no rales or wheeze. ?ABDOMEN: Soft positive bowel sounds ?NEURO: No gross focal neurological deficits. ?MSK: Movement of extremity x 4. ? ? ?ASSESSMENT & PLAN: ? ?See problem based charting & AVS for pt instructions. ?1.  Diarrhea: Would be an unusual side effect of bupropion but more less temporally related.  We will have him stop that for 2 or 3 days and see if the diarrhea gets better.  If not, restart bupropion and speak with me early next week.  Continue to hydrate. ?Will stop his iron for next  week or so to simplify bowel issues. Also consider decrease lactulose by 50% for 2-3 days. F/u with me on Monday verbally. ?2.  Abnormal abdominal ultrasound.  I looked at the results from that and compared it with his previous MRI.  The  abnormal findings are in the same place as his previously seen angioma.  I do not think there is anything specific here that is worrisome.  They had already talked to him about potentially doing an MRI and I agree with that.  In fact he might need MRI q. year as was suggested by the initial hepatology department at [facility].  He is amenable to this. ? ?No problem-specific Assessment & Plan notes found for this encounter. ? ?

## 2021-06-04 NOTE — Assessment & Plan Note (Signed)
Refilled some rx ?Continue current therapy ?Appreciate the input from hepatology and we will comanage ? ?

## 2021-06-15 ENCOUNTER — Other Ambulatory Visit (HOSPITAL_COMMUNITY): Payer: Self-pay

## 2021-06-15 MED ORDER — LACTULOSE 10 GM/15ML PO SOLN
30.0000 g | Freq: Two times a day (BID) | ORAL | 8 refills | Status: DC
Start: 2020-11-20 — End: 2021-07-02
  Filled 2021-06-15: qty 3784, 42d supply, fill #0

## 2021-06-29 ENCOUNTER — Encounter: Payer: Self-pay | Admitting: *Deleted

## 2021-06-30 ENCOUNTER — Ambulatory Visit
Admission: RE | Admit: 2021-06-30 | Discharge: 2021-06-30 | Disposition: A | Discharge status: Home / Self Care | Payer: 59 | Source: Ambulatory Visit | Attending: Nurse Practitioner | Admitting: Nurse Practitioner

## 2021-06-30 DIAGNOSIS — K8020 Calculus of gallbladder without cholecystitis without obstruction: Secondary | ICD-10-CM | POA: Diagnosis not present

## 2021-06-30 DIAGNOSIS — D376 Neoplasm of uncertain behavior of liver, gallbladder and bile ducts: Secondary | ICD-10-CM

## 2021-06-30 IMAGING — MR MR ABDOMEN WO/W CM
12 of 19 series · 26 of 48 positions shown · IV contrast (15 ML MULTIHANCE)
Comparison: MRI abdomen [DATE]

CLINICAL DATA: Liver disease

EXAM:
MRI ABDOMEN WITHOUT AND WITH CONTRAST
TECHNIQUE: Multiplanar multisequence MR imaging of the abdomen was performed
both before and after the administration of intravenous contrast.
CONTRAST:  15mL MULTIHANCE GADOBENATE DIMEGLUMINE 529 MG/ML IV SOLN

[Series 3: T2 · coronal · 5.0mm · 1.56mm/px · 2 of 34 slices shown (1 of 3)]
[im 1/34]
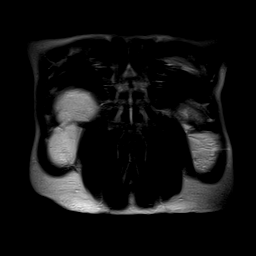
[im 34/34]
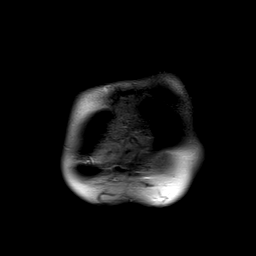

[Series 4: axial tru fisp · axial · 5.0mm · 1.48mm/px · z∈[-106,+118]mm · 2 of 40 slices shown]
[im 1/40]
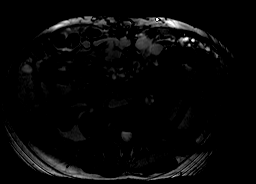
[im 40/40]
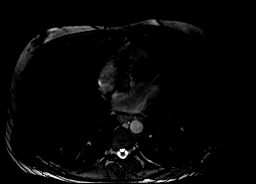

[Series 5: T2 · axial · 6.5mm · 0.74mm/px · 1 of 40 slices shown (2 of 3)]
[im 1/40]
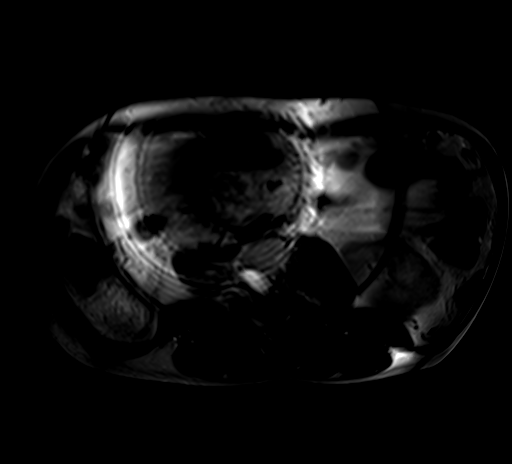

[Series 6: [id number] · axial · 6.0mm · 1.98mm/px · z∈[-67,+206]mm · 3 of 106 slices shown]
[im 1/106]
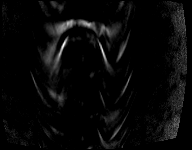
[im 53/106]
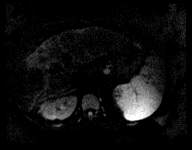
[im 106/106]
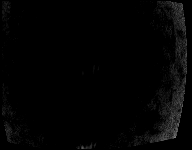

[Series 7: [id number]_adc · axial · 6.0mm · 1.98mm/px · 1 of 35 slices shown]
[im 1/35]
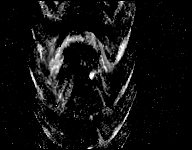

[Series 8: T2 · axial · 5.0mm · 1.45mm/px · 1 of 40 slices shown (3 of 3)]
[im 1/40]
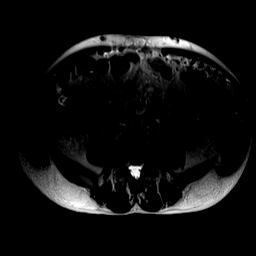

[Series 9: axial in out · axial · 6.0mm · 0.74mm/px · z∈[-109,+126]mm · 2 of 70 slices shown]
[im 1/70]
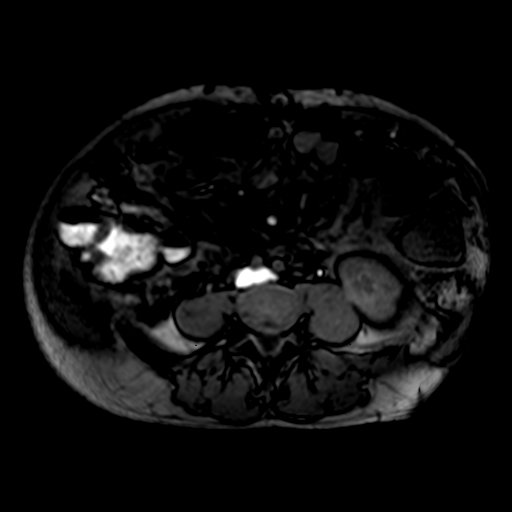
[im 70/70]
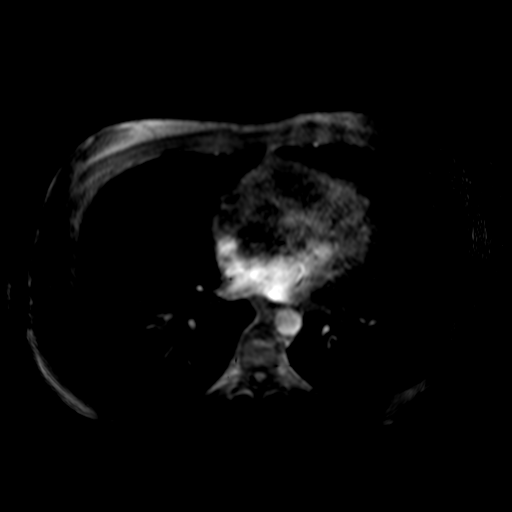

[Series 10: T1 dynamic · axial · non-contrast · 2.0mm · 0.78mm/px · z∈[-111,+127]mm · 3 of 120 slices shown]
[im 1/120]
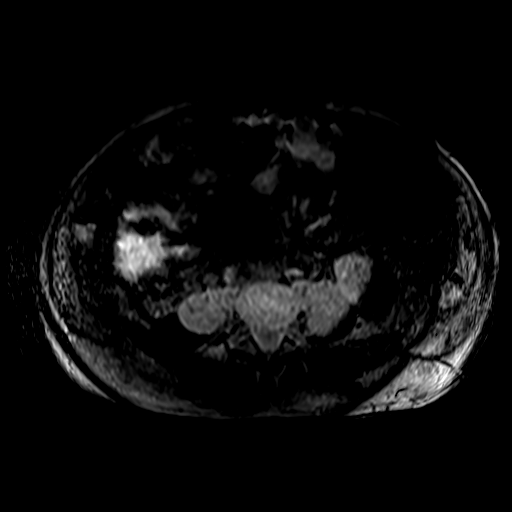
[im 60/120]
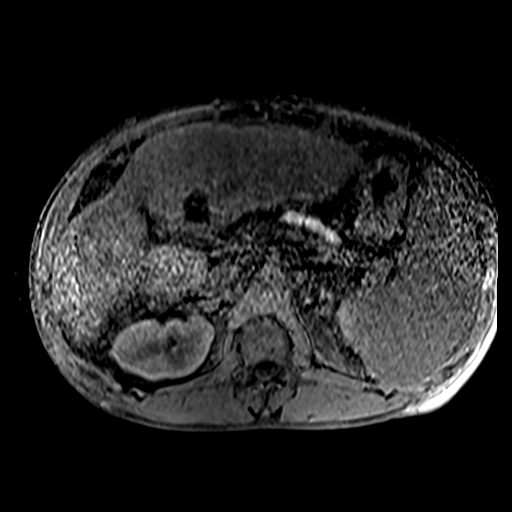
[im 120/120]
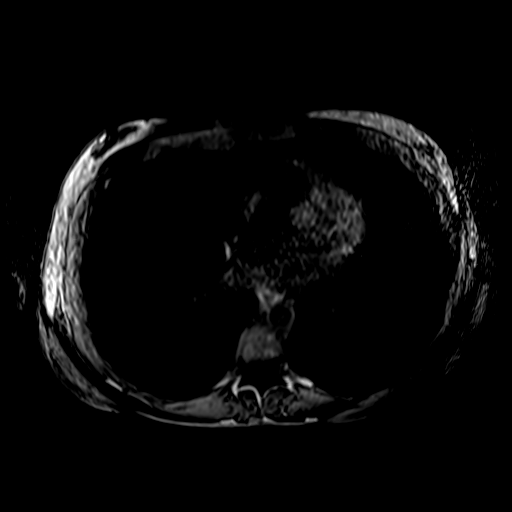

[Series 11: post 30 sec · axial · 2.0mm · 0.78mm/px · z∈[-111,+127]mm · 3 of 120 slices shown]
[im 1/120]
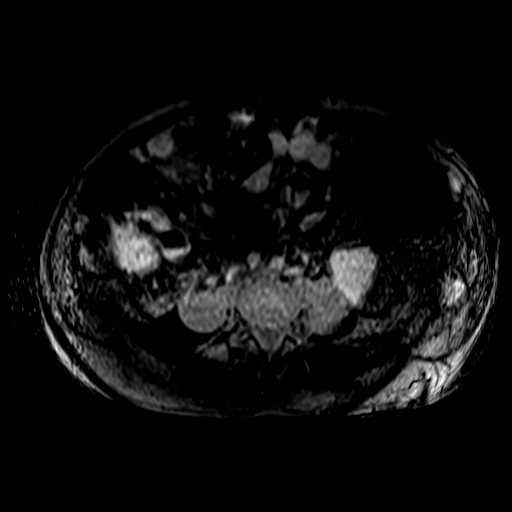
[im 60/120]
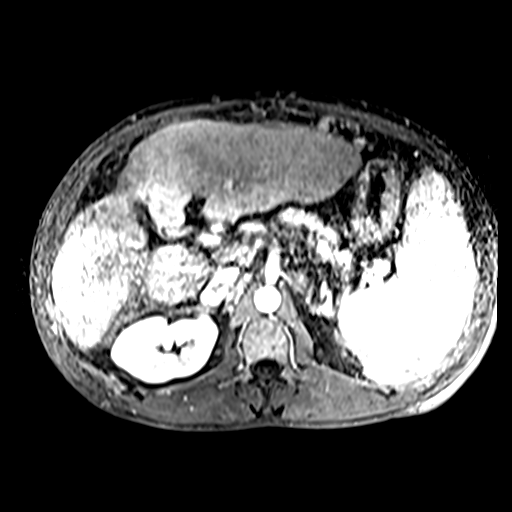
[im 120/120]
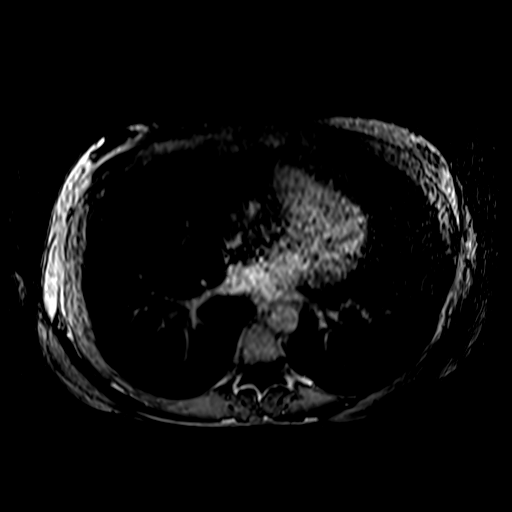

[Series 12: post 30 sec_sub · axial · 2.0mm · 0.78mm/px · z∈[-111,+127]mm · 3 of 120 slices shown]
[im 1/120]
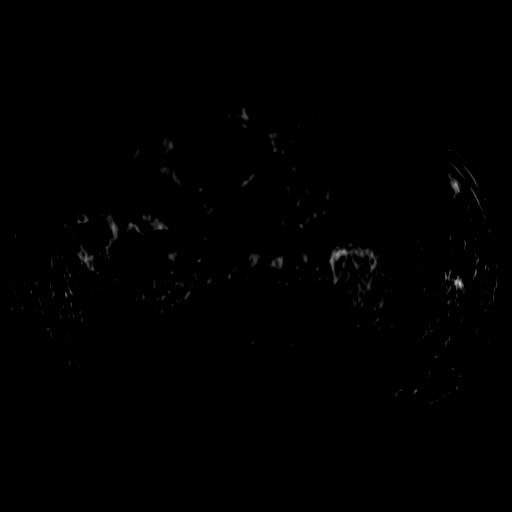
[im 60/120]
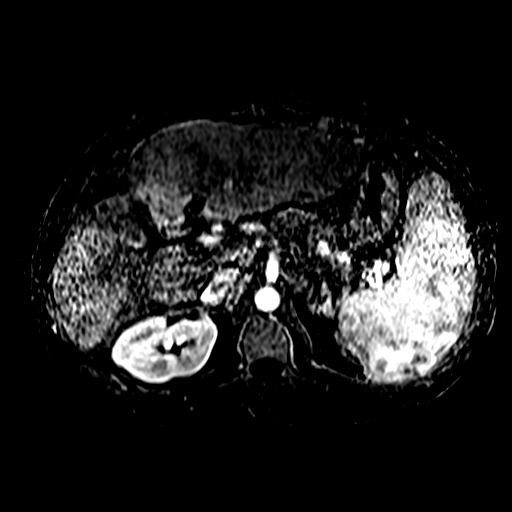
[im 120/120]
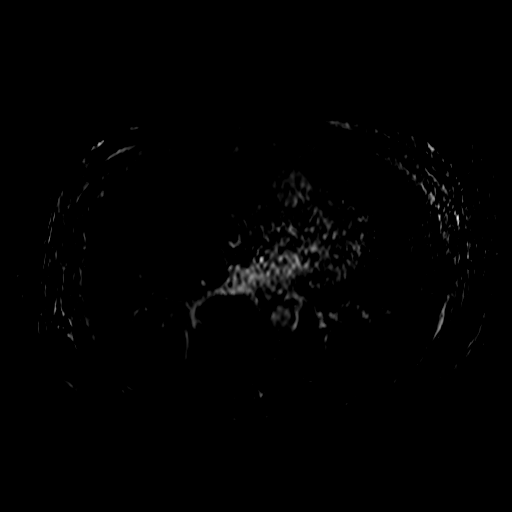

[Series 13: post 60 sec · axial · 2.0mm · 0.78mm/px · z∈[-111,+127]mm · 3 of 120 slices shown]
[im 1/120]
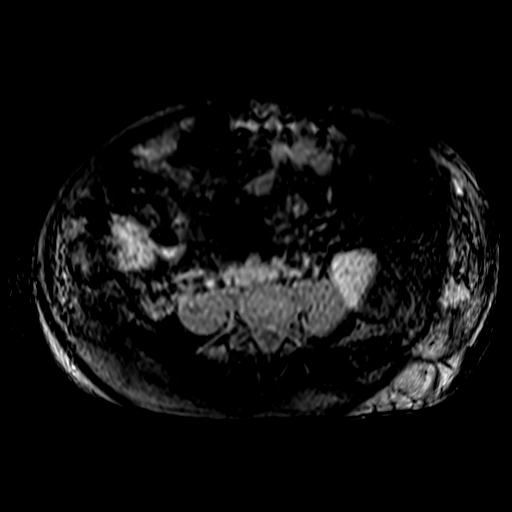
[im 60/120]
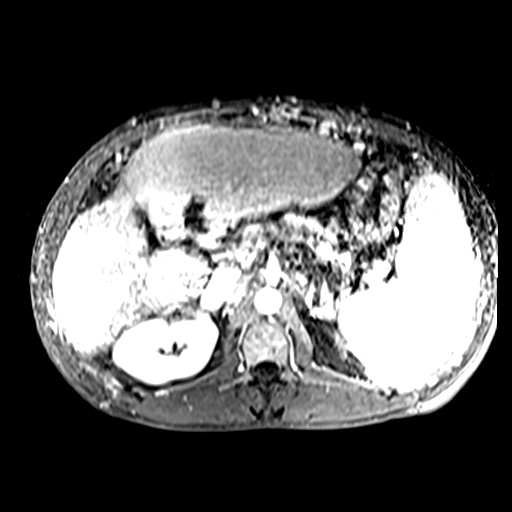
[im 120/120]
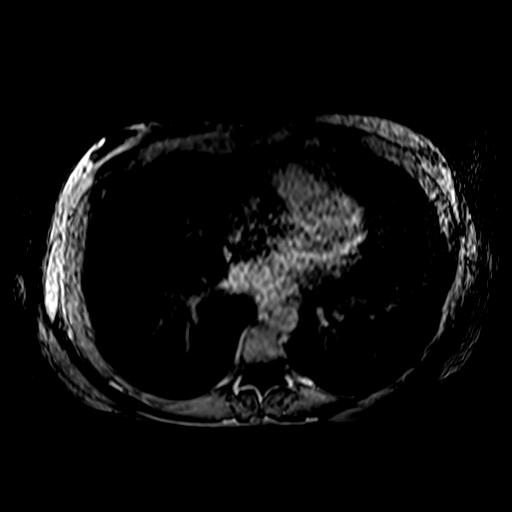

[Series 14: post 60 sec_sub · axial · 2.0mm · 0.78mm/px · z∈[-111,+7]mm · 2 of 120 slices shown]
[im 1/120]
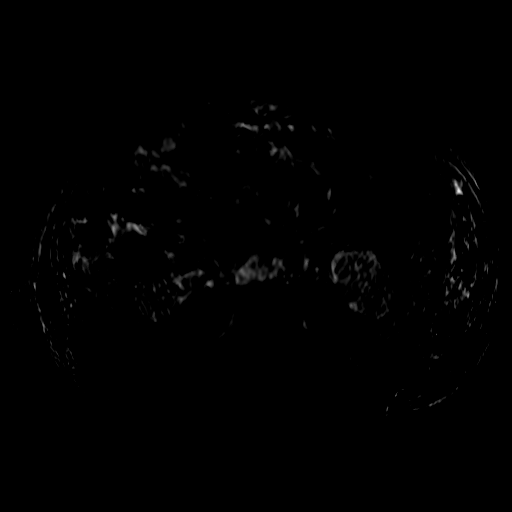
[im 60/120]
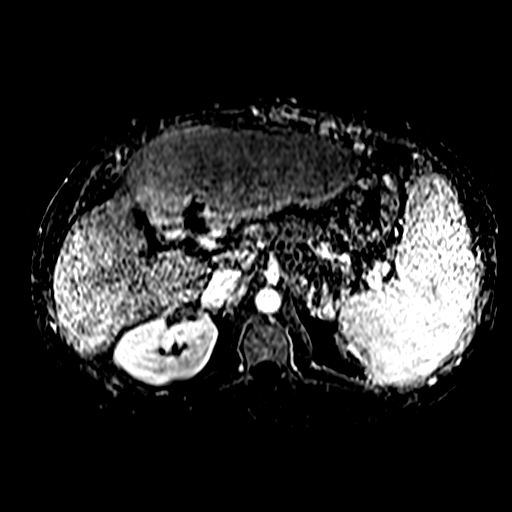

[26 of 48 positions shown; findings below may reference images not displayed]

FINDINGS: Limited study due to motion.

Lower chest: No acute findings.

Hepatobiliary: Liver is enlarged and lobulated consistent with
cirrhosis. Persistent heterogeneity of the parenchyma with
inhomogeneous enhancement. Stable 2.3 cm partially exophytic
hemangioma in the anterior right hepatic lobe near the gallbladder.
No new suspicious enhancing/washout lesion identified. Gallbladder
contains a few small calculi dependently in the fundus. No biliary
ductal dilatation.

Pancreas: No mass, inflammatory changes, or other parenchymal
abnormality identified.

Spleen: Markedly enlarged measuring 17.3 cm in length with stable
heterogeneity of the parenchyma.

Adrenals/Urinary Tract: No masses identified. No evidence of
hydronephrosis.

Stomach/Bowel: No evidence of bowel obstruction.

Vascular/Lymphatic:  Numerous varices again visualized.

Other:  Trace ascites in the upper abdomen.

Musculoskeletal: No suspicious bone lesions identified.
IMPRESSION: 1. Similar appearance of hepatic cirrhosis and fibrosis. Stable
hepatic hemangioma. No new suspicious hepatic mass identified.
2. Evidence of portal hypertension with splenomegaly and varices.
3. Cholelithiasis.
4. Small volume ascites.

## 2021-06-30 MED ORDER — GADOBENATE DIMEGLUMINE 529 MG/ML IV SOLN
15.0000 mL | Freq: Once | INTRAVENOUS | Status: AC | PRN
  Administered 2021-06-30: 15 mL via INTRAVENOUS

## 2021-07-02 ENCOUNTER — Ambulatory Visit (INDEPENDENT_AMBULATORY_CARE_PROVIDER_SITE_OTHER): Payer: 59 | Admitting: Family Medicine

## 2021-07-02 VITALS — BP 120/72 | Ht 69.0 in | Wt 165.0 lb

## 2021-07-02 DIAGNOSIS — K7030 Alcoholic cirrhosis of liver without ascites: Secondary | ICD-10-CM

## 2021-07-03 ENCOUNTER — Encounter: Payer: Self-pay | Admitting: Family Medicine

## 2021-07-03 NOTE — Assessment & Plan Note (Signed)
Reviewed MRI findings which are reassuring.  No significant change.  Small volume ascites noted on MRI but not clinically significant at this time.  We reviewed his medicines made some adjustments.  We will get some labs.  Follow-up 2 to 3 months, sooner with problems.

## 2021-07-03 NOTE — Progress Notes (Signed)
  KERNEY HOPFENSPERGER - 46 y.o. male MRN [id number]  Date of birth: [birth year]    SUBJECTIVE:      Chief Complaint:/ HPI:  Follow-up MRI.  Needs refills on some his medicines and has some questions.    OBJECTIVE: BP 120/72   Ht '5\' 9"'$  (1.753 m)   Wt 165 lb (74.8 kg)   BMI 24.37 kg/m   Physical Exam:  Vital signs are reviewed. Vital signs reviewed. GENERAL: Well-developed, well-nourished, no acute distress. CARDIOVASCULAR: Regular rate and rhythm no murmur gallop or rub LUNGS: Clear to auscultation bilaterally, no rales or wheeze. ABDOMEN: Soft positive bowel sounds, no notable ascites. NEURO: No gross focal neurological deficits. MSK: Movement of extremity x 4.   ASSESSMENT & PLAN:  See problem based charting & AVS for pt instructions. Liver cirrhosis [facility]) Reviewed MRI findings which are reassuring.  No significant change.  Small volume ascites noted on MRI but not clinically significant at this time.  We reviewed his medicines made some adjustments.  We will get some labs.  Follow-up 2 to 3 months, sooner with problems.

## 2021-07-05 DIAGNOSIS — K7030 Alcoholic cirrhosis of liver without ascites: Secondary | ICD-10-CM | POA: Diagnosis not present

## 2021-07-06 LAB — CBC
Hematocrit: 39.1 % (ref 37.5–51.0)
Hemoglobin: 13.5 g/dL (ref 13.0–17.7)
MCH: 32.5 pg (ref 26.6–33.0)
MCHC: 34.5 g/dL (ref 31.5–35.7)
MCV: 94 fL (ref 79–97)
Platelets: 88 10*3/uL — CL (ref 150–450)
RBC: 4.16 x10E6/uL (ref 4.14–5.80)
RDW: 13.7 % (ref 11.6–15.4)
WBC: 4.1 10*3/uL (ref 3.4–10.8)

## 2021-07-06 LAB — IRON,TIBC AND FERRITIN PANEL
Ferritin: 50 ng/mL (ref 30–400)
Iron Saturation: 39 % (ref 15–55)
Iron: 142 ug/dL (ref 38–169)
Total Iron Binding Capacity: 367 ug/dL (ref 250–450)
UIBC: 225 ug/dL (ref 111–343)

## 2021-07-06 LAB — COMPREHENSIVE METABOLIC PANEL
ALT: 49 IU/L — ABNORMAL HIGH (ref 0–44)
AST: 60 IU/L — ABNORMAL HIGH (ref 0–40)
Albumin/Globulin Ratio: 1.1 — ABNORMAL LOW (ref 1.2–2.2)
Albumin: 3.7 g/dL — ABNORMAL LOW (ref 4.0–5.0)
Alkaline Phosphatase: 138 IU/L — ABNORMAL HIGH (ref 44–121)
BUN/Creatinine Ratio: 10 (ref 9–20)
BUN: 7 mg/dL (ref 6–24)
Bilirubin Total: 2.1 mg/dL — ABNORMAL HIGH (ref 0.0–1.2)
CO2: 22 mmol/L (ref 20–29)
Calcium: 9.4 mg/dL (ref 8.7–10.2)
Chloride: 102 mmol/L (ref 96–106)
Creatinine, Ser: 0.73 mg/dL — ABNORMAL LOW (ref 0.76–1.27)
Globulin, Total: 3.5 g/dL (ref 1.5–4.5)
Glucose: 115 mg/dL — ABNORMAL HIGH (ref 70–99)
Potassium: 4.6 mmol/L (ref 3.5–5.2)
Sodium: 141 mmol/L (ref 134–144)
Total Protein: 7.2 g/dL (ref 6.0–8.5)
eGFR: 113 mL/min/{1.73_m2} (ref >59)

## 2021-07-07 ENCOUNTER — Other Ambulatory Visit (HOSPITAL_COMMUNITY): Payer: Self-pay

## 2021-07-09 ENCOUNTER — Encounter: Payer: Self-pay | Admitting: Family Medicine

## 2021-07-14 ENCOUNTER — Other Ambulatory Visit (HOSPITAL_COMMUNITY): Payer: Self-pay

## 2021-07-14 ENCOUNTER — Other Ambulatory Visit: Payer: Self-pay | Admitting: Family Medicine

## 2021-07-14 MED ORDER — OXYCODONE HCL 15 MG PO TABS
15.0000 mg | ORAL_TABLET | Freq: Two times a day (BID) | ORAL | 0 refills | Status: DC | PRN
  Filled 2021-07-14: qty 15, 8d supply, fill #0

## 2021-07-15 ENCOUNTER — Other Ambulatory Visit: Payer: 59 | Admitting: Sports Medicine

## 2021-08-03 ENCOUNTER — Other Ambulatory Visit (HOSPITAL_COMMUNITY): Payer: Self-pay

## 2021-08-11 ENCOUNTER — Encounter: Payer: Self-pay | Admitting: Family Medicine

## 2021-08-12 ENCOUNTER — Ambulatory Visit (INDEPENDENT_AMBULATORY_CARE_PROVIDER_SITE_OTHER): Payer: 59

## 2021-08-12 DIAGNOSIS — Z23 Encounter for immunization: Secondary | ICD-10-CM

## 2021-08-13 ENCOUNTER — Ambulatory Visit (INDEPENDENT_AMBULATORY_CARE_PROVIDER_SITE_OTHER): Payer: 59 | Admitting: Family Medicine

## 2021-08-13 ENCOUNTER — Other Ambulatory Visit (HOSPITAL_COMMUNITY): Payer: Self-pay

## 2021-08-13 VITALS — BP 125/68 | Ht 69.0 in | Wt 165.0 lb

## 2021-08-13 DIAGNOSIS — K7030 Alcoholic cirrhosis of liver without ascites: Secondary | ICD-10-CM

## 2021-08-13 MED ORDER — LACTULOSE 10 GM/15ML PO SOLN
30.0000 g | Freq: Two times a day (BID) | ORAL | 11 refills | Status: DC
  Filled 2021-08-13 (×2): qty 3784, 42d supply, fill #0
  Filled 2021-08-13: qty 4050, 45d supply, fill #0
  Filled 2021-10-13: qty 3784, 42d supply, fill #1
  Filled 2021-11-26: qty 3784, 42d supply, fill #2

## 2021-08-15 NOTE — Progress Notes (Signed)
  TRUITT CRUEY - 46 y.o. male MRN [id number]  Date of birth: April 16, 1975    SUBJECTIVE:      Chief Complaint:/ HPI: BP 125/68   Ht '5\' 9"'$  (1.753 m)   Wt 165 lb (74.8 kg)   BMI 24.37 kg/m   Snake bite 2 days ago Black snake Received tetanus booster yesterday  Physical Exam:  Vital signs are reviewed. left wrist without any sign of infection. No induration. Two puncture marks. Pulses normal. FROM  ASSESSMENT & PLAN: Non venomous snake bite no sign infection tetanus updated Labs due for chronic problems

## 2021-08-16 ENCOUNTER — Other Ambulatory Visit (HOSPITAL_COMMUNITY): Payer: Self-pay

## 2021-08-19 DIAGNOSIS — K7030 Alcoholic cirrhosis of liver without ascites: Secondary | ICD-10-CM | POA: Diagnosis not present

## 2021-08-19 NOTE — Addendum Note (Signed)
Addended by: [place] Pink on: 08/19/2021 10:31 AM   Modules accepted: Orders

## 2021-08-20 ENCOUNTER — Other Ambulatory Visit: Payer: Self-pay | Admitting: Family Medicine

## 2021-08-20 ENCOUNTER — Encounter: Payer: Self-pay | Admitting: Family Medicine

## 2021-08-20 LAB — CBC
Hematocrit: 40.4 % (ref 37.5–51.0)
Hemoglobin: 13.7 g/dL (ref 13.0–17.7)
MCH: 32.2 pg (ref 26.6–33.0)
MCHC: 33.9 g/dL (ref 31.5–35.7)
MCV: 95 fL (ref 79–97)
Platelets: 62 x10E3/uL — CL (ref 150–450)
RBC: 4.26 x10E6/uL (ref 4.14–5.80)
RDW: 12.9 % (ref 11.6–15.4)
WBC: 2.7 x10E3/uL — ABNORMAL LOW (ref 3.4–10.8)

## 2021-08-20 LAB — COMPREHENSIVE METABOLIC PANEL
ALT: 47 IU/L — ABNORMAL HIGH (ref 0–44)
AST: 56 IU/L — ABNORMAL HIGH (ref 0–40)
Albumin/Globulin Ratio: 1.1 — ABNORMAL LOW (ref 1.2–2.2)
Albumin: 3.6 g/dL — ABNORMAL LOW (ref 4.1–5.1)
Alkaline Phosphatase: 127 IU/L — ABNORMAL HIGH (ref 44–121)
BUN/Creatinine Ratio: 15 (ref 9–20)
BUN: 11 mg/dL (ref 6–24)
Bilirubin Total: 1.9 mg/dL — ABNORMAL HIGH (ref 0.0–1.2)
CO2: 24 mmol/L (ref 20–29)
Calcium: 9.3 mg/dL (ref 8.7–10.2)
Chloride: 101 mmol/L (ref 96–106)
Creatinine, Ser: 0.72 mg/dL — ABNORMAL LOW (ref 0.76–1.27)
Globulin, Total: 3.4 g/dL (ref 1.5–4.5)
Glucose: 152 mg/dL — ABNORMAL HIGH (ref 70–99)
Potassium: 4.7 mmol/L (ref 3.5–5.2)
Sodium: 137 mmol/L (ref 134–144)
Total Protein: 7 g/dL (ref 6.0–8.5)
eGFR: 113 mL/min/{1.73_m2} (ref >59)

## 2021-08-20 LAB — PROTIME-INR
INR: 1.3 — ABNORMAL HIGH (ref 0.9–1.2)
Prothrombin Time: 13.3 s — ABNORMAL HIGH (ref 9.1–12.0)

## 2021-08-20 MED ORDER — PANTOPRAZOLE SODIUM 40 MG PO TBEC: 40.0000 mg | DELAYED_RELEASE_TABLET | Freq: Two times a day (BID) | ORAL | 3 refills | Status: DC

## 2021-08-20 MED ORDER — OXYCODONE HCL 15 MG PO TABS: 15.0000 mg | ORAL_TABLET | Freq: Two times a day (BID) | ORAL | 0 refills | Status: DC | PRN

## 2021-08-27 ENCOUNTER — Ambulatory Visit (INDEPENDENT_AMBULATORY_CARE_PROVIDER_SITE_OTHER): Payer: 59 | Admitting: Family Medicine

## 2021-08-27 DIAGNOSIS — M25512 Pain in left shoulder: Secondary | ICD-10-CM

## 2021-08-27 MED ORDER — METHYLPREDNISOLONE ACETATE 40 MG/ML IJ SUSP
40.0000 mg | Freq: Once | INTRAMUSCULAR | Status: AC
  Administered 2021-08-27: 40 mg via INTRA_ARTICULAR

## 2021-08-27 NOTE — Progress Notes (Signed)
  Joshua Hubbard - 46 y.o. male MRN [id number]  Date of birth: 1975-02-05    SUBJECTIVE:      Chief Complaint:/ HPI:  Left shoulder pain He actually has bilateral shoulder pain and has been diagnosed with rotator cuff previously.  Right now the left shoulder has been bothering him more over the last couple of months and is having to alter all of his activities to account for pain.  He particularly has trouble reaching forward or above shoulder level.  No numbness or tingling in his hands.   OBJECTIVE: Ht '5\' 9"'$  (1.753 m)   Wt 168 lb (76.2 kg)   BMI 24.81 kg/m   Physical Exam:  Vital signs are reviewed. GENERAL: Well-developed male no acute distress Shoulder: Symmetrical.  Left shoulder is painful in forward flexion above 90 degrees and abduction above 70 degrees.  Internal rotation he cannot get his hand past his lateral hip.  Bicep tendon area is nontender to palpation.  Strength is limited by pain only otherwise appears to be 5 out of 5 in forward flexion abduction and supraspinatus testing although this is quite painful.  PROCEDURE: INJECTION: Patient was given informed consent, signed copy in the chart. Appropriate time out was taken. Area prepped and draped in usual sterile fashion. Ethyl chloride was  used for local anesthesia. A 21 gauge 1 1/2 inch needle was used..  1 cc of methylprednisolone 40 mg/ml plus 4 cc of 1% lidocaine without epinephrine was injected into the left subacromial bursa of the shoulder using a(n) posterior approach.   The patient tolerated the procedure well. There were no complications. Post procedure instructions were given.   ASSESSMENT & PLAN: #1.  Left shoulder pain: Likely he has some rotator cuff issues here as well.  He has a known rotator cuff tear on the other side.  Symptoms are similar.  We will try subacromial injection for pain relief and see if he can get some improved range of motion.  Home exercise program.  If not, he may ultimately be looking at  some type of surgical intervention for which we would need MRI to further assess. See problem based charting & AVS for pt instructions. No problem-specific Assessment & Plan notes found for this encounter.

## 2021-09-22 ENCOUNTER — Other Ambulatory Visit (HOSPITAL_COMMUNITY): Payer: Self-pay

## 2021-09-24 ENCOUNTER — Other Ambulatory Visit (HOSPITAL_COMMUNITY): Payer: Self-pay

## 2021-09-24 ENCOUNTER — Ambulatory Visit (INDEPENDENT_AMBULATORY_CARE_PROVIDER_SITE_OTHER): Payer: 59 | Admitting: Family Medicine

## 2021-09-24 ENCOUNTER — Encounter: Payer: Self-pay | Admitting: Family Medicine

## 2021-09-24 VITALS — BP 116/56 | Ht 69.0 in

## 2021-09-24 DIAGNOSIS — M25512 Pain in left shoulder: Secondary | ICD-10-CM

## 2021-09-24 DIAGNOSIS — R0789 Other chest pain: Secondary | ICD-10-CM | POA: Diagnosis not present

## 2021-09-24 DIAGNOSIS — M25511 Pain in right shoulder: Secondary | ICD-10-CM | POA: Diagnosis not present

## 2021-09-24 MED ORDER — PANTOPRAZOLE SODIUM 40 MG PO TBEC
40.0000 mg | DELAYED_RELEASE_TABLET | Freq: Two times a day (BID) | ORAL | 3 refills | Status: DC
  Filled 2021-09-24: qty 180, 90d supply, fill #0
  Filled 2022-01-11: qty 180, 90d supply, fill #1
  Filled 2022-04-20: qty 180, 90d supply, fill #2

## 2021-09-24 MED ORDER — OXYCODONE HCL 15 MG PO TABS
15.0000 mg | ORAL_TABLET | Freq: Two times a day (BID) | ORAL | 0 refills | Status: DC | PRN
  Filled 2021-09-24: qty 15, 8d supply, fill #0

## 2021-09-24 MED ORDER — DICYCLOMINE HCL 10 MG PO CAPS
10.0000 mg | ORAL_CAPSULE | Freq: Three times a day (TID) | ORAL | 1 refills | Status: DC | PRN
  Filled 2021-09-24: qty 60, 20d supply, fill #0
  Filled 2021-10-05: qty 60, 20d supply, fill #1

## 2021-09-24 NOTE — Progress Notes (Signed)
  Joshua Hubbard - 46 y.o. male MRN [id number]  Date of birth: Nov 19, 1975    SUBJECTIVE:      Chief Complaint:/ HPI:  #1 left shoulder: Follow-up left shoulder pain.  Injection at last office visit he is 90% improved.  Very happy about this. 2.  Right shoulder: He is right-hand dominant.  Has a known full-thickness supraspinatus muscle tear in that shoulder.  He really interferes with his daily activities and he does have some pain with it.  Also has a lot of popping in that shoulder.  Has had corticosteroid injection previously which helped minimally.  Wonders if there is additional possible interventions. 3.  Questions about known hiatal hernia.  He occasionally has chest pains that can be severe.  He thought this was related to his cirrhosis in some way but as his gastropathy and esophageal esophageal varices have stabilized, he has not seen much improvement.  Pain can come on at any time.  It is lower third of the sternum area.  Feels like its stomach.  Can be quite severe and he occasionally has to sit down.  Has intermittently used some Norco for this and that does seem to help and allow him to do activities.    OBJECTIVE: BP (!) 116/56   Ht '5\' 9"'$  (1.753 m)   BMI 24.81 kg/m   Physical Exam:  Vital signs are reviewed. GENERAL: Well-developed male no acute distress Left Shoulder: Left.  Full range of motion. Right shoulder: Decreased strength in pain with active supraspinatus resistance. ABDOMEN: Soft, positive bowel sounds nontender nondistended  ASSESSMENT & PLAN: #1.  Left shoulder pain improved.  Continue home exercise program 2.  Right shoulder with known supraspinatus tear: He had ultrasound which showed full-thickness tear several months ago.  I will refer him to orthopedics to see if there is anything additional that can be done.  This continues to bother him as he is right-hand dominant. 3.  Epigastric and chest pain: We have thought this was related to esophageal varices and  gastropathy but I am a little concerned given his family history that we need to rule out cardiac issues.  Will refer to cardiology.  We will also try Bentyl in case this is some how related to esophageal spasm.  He will follow-up with me after he sees cardiology. See problem based charting & AVS for pt instructions. No problem-specific Assessment & Plan notes found for this encounter.

## 2021-09-24 NOTE — Patient Instructions (Signed)
Dr Janine [facility] [place] [phone] Friday 10/01/21 @ 1130a

## 2021-10-05 ENCOUNTER — Other Ambulatory Visit (HOSPITAL_COMMUNITY): Payer: Self-pay

## 2021-10-11 ENCOUNTER — Other Ambulatory Visit (HOSPITAL_COMMUNITY): Payer: Self-pay

## 2021-10-13 ENCOUNTER — Other Ambulatory Visit (HOSPITAL_COMMUNITY): Payer: Self-pay

## 2021-10-18 DIAGNOSIS — M25511 Pain in right shoulder: Secondary | ICD-10-CM | POA: Diagnosis not present

## 2021-10-19 ENCOUNTER — Other Ambulatory Visit (HOSPITAL_COMMUNITY): Payer: Self-pay

## 2021-10-19 MED ORDER — FLUARIX QUADRIVALENT 0.5 ML IM SUSY
0.5000 mL | PREFILLED_SYRINGE | INTRAMUSCULAR | 0 refills | Status: DC
  Filled 2021-10-19: qty 0.5, 1d supply, fill #0

## 2021-10-22 ENCOUNTER — Ambulatory Visit (INDEPENDENT_AMBULATORY_CARE_PROVIDER_SITE_OTHER): Payer: 59 | Admitting: Family Medicine

## 2021-10-22 VITALS — Ht 69.0 in | Wt 165.0 lb

## 2021-10-22 DIAGNOSIS — B079 Viral wart, unspecified: Secondary | ICD-10-CM | POA: Diagnosis not present

## 2021-10-22 NOTE — Progress Notes (Signed)
  Joshua Hubbard - 46 y.o. male MRN [id number]  Date of birth: May 22, 1975    SUBJECTIVE:      Chief Complaint:/ HPI:  Lesion on right arm that is bothering him.  He keeps picking at it.  Its been there several months and does not seem to be growing.    OBJECTIVE: Ht '5\' 9"'$  (1.753 m)   Wt 165 lb (74.8 kg)   BMI 24.37 kg/m   Physical Exam:  Vital signs are reviewed. Right forearm there is a small flap warty papule that is about 5 mm in diameter.  Benign appearing.  Patient given informed consent, signed copy in the chart.  Area prepped and draped usual fashion.  1 cc 1% lidocaine used for local anesthesia.  Shave biopsy remove the lesion.  Hemostasis obtained with pressure.  ASSESSMENT & PLAN:  See problem based charting & AVS for pt instructions. No problem-specific Assessment & Plan notes found for this encounter.

## 2021-10-23 MED ORDER — OXYCODONE HCL 15 MG PO TABS: 15.0000 mg | ORAL_TABLET | Freq: Two times a day (BID) | ORAL | 0 refills | Status: DC | PRN

## 2021-10-23 NOTE — Addendum Note (Signed)
Addended byDorcas Mcmurray L on: 10/23/2021 01:40 PM   Modules accepted: Orders

## 2021-10-26 DIAGNOSIS — M25511 Pain in right shoulder: Secondary | ICD-10-CM | POA: Diagnosis not present

## 2021-11-03 ENCOUNTER — Ambulatory Visit: Payer: 59 | Admitting: Cardiovascular Disease

## 2021-11-03 DIAGNOSIS — M25511 Pain in right shoulder: Secondary | ICD-10-CM | POA: Diagnosis not present

## 2021-11-12 ENCOUNTER — Ambulatory Visit (INDEPENDENT_AMBULATORY_CARE_PROVIDER_SITE_OTHER): Payer: 59 | Admitting: Family Medicine

## 2021-11-12 ENCOUNTER — Encounter: Payer: Self-pay | Admitting: Family Medicine

## 2021-11-12 VITALS — BP 132/76 | Ht 69.0 in

## 2021-11-12 DIAGNOSIS — H539 Unspecified visual disturbance: Secondary | ICD-10-CM

## 2021-11-12 DIAGNOSIS — H40013 Open angle with borderline findings, low risk, bilateral: Secondary | ICD-10-CM | POA: Diagnosis not present

## 2021-11-12 DIAGNOSIS — H53132 Sudden visual loss, left eye: Secondary | ICD-10-CM | POA: Diagnosis not present

## 2021-11-12 DIAGNOSIS — H538 Other visual disturbances: Secondary | ICD-10-CM | POA: Diagnosis not present

## 2021-11-12 DIAGNOSIS — H16221 Keratoconjunctivitis sicca, not specified as Sjogren's, right eye: Secondary | ICD-10-CM | POA: Diagnosis not present

## 2021-11-12 DIAGNOSIS — H5712 Ocular pain, left eye: Secondary | ICD-10-CM | POA: Diagnosis not present

## 2021-11-12 DIAGNOSIS — H3562 Retinal hemorrhage, left eye: Secondary | ICD-10-CM | POA: Diagnosis not present

## 2021-11-12 DIAGNOSIS — H2513 Age-related nuclear cataract, bilateral: Secondary | ICD-10-CM | POA: Diagnosis not present

## 2021-11-12 MED ORDER — OXYCODONE HCL 15 MG PO TABS: 15.0000 mg | ORAL_TABLET | Freq: Two times a day (BID) | ORAL | 0 refills | Status: DC | PRN

## 2021-11-12 NOTE — Patient Instructions (Signed)
[facility] Dr Katy Fitch Friday 11/12/21 @ [place], [place], [place] [address] Phone: [phone]

## 2021-11-12 NOTE — Progress Notes (Unsigned)
  MALCOMB GANGEMI - 46 y.o. male MRN [id number]  Date of birth: 09/29/1975    SUBJECTIVE:      Chief Complaint:/ HPI:    Several weeks of increasing problems with left eye.  It seems more blurry especially as the day goes on.  He also notices that it gets sort of red when he looks in the mirror again as the day moves on and he occasionally has pain in it.  Right eye seems to be fine.  No eye injury.   OBJECTIVE: There were no vitals taken for this visit.  Physical Exam:  Vital signs are reviewed. HEENT: Pupils equal round reactive to light extraocular muscle intact.  Conjunctive a noninjected.  No pain on palpation of the orbits.  Funduscopic exam is normal.  Specifically no anterior chamber debris or fluid is noted.  Disc appears flat bilaterally with relatively normal vasculature although this is limited by portable funduscopic exam equipment.  ASSESSMENT & PLAN: Unilateral visual changes with intermittent pain, gradually worsening over the last few weeks.  Given complicated past medical history including chronic thrombocytopenia, will urgently refer to ophthalmology.  They have agreed to see him today.  See chart for visual exam which shows left eye uncorrected 20/70.  He does wear reading glasses. See problem based charting & AVS for pt instructions. No problem-specific Assessment & Plan notes found for this encounter.

## 2021-11-15 ENCOUNTER — Other Ambulatory Visit (HOSPITAL_COMMUNITY): Payer: Self-pay

## 2021-11-16 ENCOUNTER — Other Ambulatory Visit (HOSPITAL_COMMUNITY): Payer: Self-pay

## 2021-11-23 ENCOUNTER — Ambulatory Visit: Payer: 59 | Attending: Cardiovascular Disease | Admitting: Cardiovascular Disease

## 2021-11-25 ENCOUNTER — Encounter: Payer: Self-pay | Admitting: Family Medicine

## 2021-11-26 ENCOUNTER — Encounter: Payer: Self-pay | Admitting: Family Medicine

## 2021-11-26 ENCOUNTER — Other Ambulatory Visit: Payer: Self-pay | Admitting: Family Medicine

## 2021-11-26 ENCOUNTER — Telehealth: Payer: Self-pay

## 2021-11-26 ENCOUNTER — Other Ambulatory Visit (HOSPITAL_COMMUNITY): Payer: Self-pay

## 2021-11-26 ENCOUNTER — Ambulatory Visit (INDEPENDENT_AMBULATORY_CARE_PROVIDER_SITE_OTHER): Payer: 59 | Admitting: Family Medicine

## 2021-11-26 VITALS — BP 136/76 | Ht 69.0 in | Wt 165.0 lb

## 2021-11-26 DIAGNOSIS — K7030 Alcoholic cirrhosis of liver without ascites: Secondary | ICD-10-CM | POA: Diagnosis not present

## 2021-11-26 DIAGNOSIS — M79645 Pain in left finger(s): Secondary | ICD-10-CM

## 2021-11-26 DIAGNOSIS — K922 Gastrointestinal hemorrhage, unspecified: Secondary | ICD-10-CM | POA: Diagnosis not present

## 2021-11-26 DIAGNOSIS — L720 Epidermal cyst: Secondary | ICD-10-CM | POA: Diagnosis not present

## 2021-11-26 NOTE — Telephone Encounter (Signed)
Reminder received in Epic to repeat MRI for 1 year  12/2021 for pt: Chart reviewed: Pt recently had MRI 06/30/2021 done by [facility]: Please advise

## 2021-11-26 NOTE — Telephone Encounter (Signed)
Noted  

## 2021-11-26 NOTE — Telephone Encounter (Signed)
[facility] is following now so no need to repeat an MR now

## 2021-11-26 NOTE — Progress Notes (Signed)
  Joshua Hubbard - 46 y.o. male MRN [id number]  Date of birth: 1975-06-28    SUBJECTIVE:      Chief Complaint:/ HPI:  #1.  Left long finger stiffness and pain.  Particular bothersome in the mornings.  Has been bothering him for about the last 2 to 3 weeks.  No specific known injury.  It gets better throughout the day.  He has not noticed any redness or warmth of the joint.  No history of gout. 2.  Has a skin lesion on his back that he wants me to look at.  His daughter noticed it and its been a little painful to lean back on that area.  Just wants to make sure it is nothing important 3.  Also talked about getting some labs to follow-up his chronic issues.  He is otherwise doing well.    OBJECTIVE: BP 136/76   Ht '5\' 9"'$  (1.753 m)   Wt 165 lb (74.8 kg)   BMI 24.37 kg/m   Physical Exam:  Vital signs are reviewed. GENERAL: Well-developed male no acute distress ABDOMEN: Soft, positive bowel sounds.  No masses, no ascites noted. SKIN: Back area reveals a small blackhead that is positioned right over one of the thoracic vertebra.  It looks mildly irritated around the area with some mild erythema.  No sign of infection, no induration. MSK: Left long finger is a little crooked and this looks chronic.  He has full range of motion of the MCP PIP and DIP joints.  Mildly tender to palpation of the PIP joint.  Ligamentously intact.  Sensation is intact.  ASSESSMENT & PLAN: #1.  Joint stiffness left long PIP joint.  Likely from prior softball injury as a child.  If it gets worse, we could consider corticosteroid injection but I think he was mostly just worried about it. 2.  Small comedone/blackhead on the skin of the back.  I would quit worrying about it and quit picking at it.  If it becomes more problematic, we could remove it. 3.  Doing well with alcohol abstinence.  Has not needed any paracentesis in quite a while.  We will check some labs specifically hemoglobin, platelets, INR, LFTs.  Follow-up 3  months. See problem based charting & AVS for pt instructions. No problem-specific Assessment & Plan notes found for this encounter.

## 2021-11-29 DIAGNOSIS — K922 Gastrointestinal hemorrhage, unspecified: Secondary | ICD-10-CM | POA: Diagnosis not present

## 2021-11-29 DIAGNOSIS — K7030 Alcoholic cirrhosis of liver without ascites: Secondary | ICD-10-CM | POA: Diagnosis not present

## 2021-11-30 LAB — COMPREHENSIVE METABOLIC PANEL
ALT: 39 IU/L (ref 0–44)
AST: 49 IU/L — ABNORMAL HIGH (ref 0–40)
Albumin/Globulin Ratio: 1.1 — ABNORMAL LOW (ref 1.2–2.2)
Albumin: 3.5 g/dL — ABNORMAL LOW (ref 4.1–5.1)
Alkaline Phosphatase: 126 IU/L — ABNORMAL HIGH (ref 44–121)
BUN/Creatinine Ratio: 12 (ref 9–20)
BUN: 8 mg/dL (ref 6–24)
Bilirubin Total: 1.6 mg/dL — ABNORMAL HIGH (ref 0.0–1.2)
CO2: 23 mmol/L (ref 20–29)
Calcium: 9.4 mg/dL (ref 8.7–10.2)
Chloride: 101 mmol/L (ref 96–106)
Creatinine, Ser: 0.69 mg/dL — ABNORMAL LOW (ref 0.76–1.27)
Globulin, Total: 3.1 g/dL (ref 1.5–4.5)
Glucose: 128 mg/dL — ABNORMAL HIGH (ref 70–99)
Potassium: 5 mmol/L (ref 3.5–5.2)
Sodium: 137 mmol/L (ref 134–144)
Total Protein: 6.6 g/dL (ref 6.0–8.5)
eGFR: 115 mL/min/{1.73_m2} (ref >59)

## 2021-11-30 LAB — PROTIME-INR
INR: 1.3 — ABNORMAL HIGH (ref 0.9–1.2)
Prothrombin Time: 13.8 s — ABNORMAL HIGH (ref 9.1–12.0)

## 2021-11-30 LAB — CBC
Hematocrit: 38.6 % (ref 37.5–51.0)
Hemoglobin: 13.5 g/dL (ref 13.0–17.7)
MCH: 31.6 pg (ref 26.6–33.0)
MCHC: 35 g/dL (ref 31.5–35.7)
MCV: 90 fL (ref 79–97)
Platelets: 68 10*3/uL — CL (ref 150–450)
RBC: 4.27 x10E6/uL (ref 4.14–5.80)
RDW: 11.9 % (ref 11.6–15.4)
WBC: 2.6 10*3/uL — ABNORMAL LOW (ref 3.4–10.8)

## 2021-12-01 ENCOUNTER — Encounter: Payer: Self-pay | Admitting: Family Medicine

## 2021-12-06 ENCOUNTER — Other Ambulatory Visit: Payer: Self-pay | Admitting: Family Medicine

## 2021-12-06 MED ORDER — OXYCODONE HCL 15 MG PO TABS: 15.0000 mg | ORAL_TABLET | Freq: Two times a day (BID) | ORAL | 0 refills | Status: DC | PRN

## 2021-12-13 ENCOUNTER — Ambulatory Visit (INDEPENDENT_AMBULATORY_CARE_PROVIDER_SITE_OTHER): Payer: 59 | Admitting: Internal Medicine

## 2021-12-13 ENCOUNTER — Encounter: Payer: Self-pay | Admitting: Internal Medicine

## 2021-12-13 VITALS — BP 118/62 | HR 74 | Wt 166.0 lb

## 2021-12-13 DIAGNOSIS — Z1211 Encounter for screening for malignant neoplasm of colon: Secondary | ICD-10-CM | POA: Diagnosis not present

## 2021-12-13 DIAGNOSIS — I8511 Secondary esophageal varices with bleeding: Secondary | ICD-10-CM | POA: Diagnosis not present

## 2021-12-13 DIAGNOSIS — K7682 Hepatic encephalopathy: Secondary | ICD-10-CM | POA: Diagnosis not present

## 2021-12-13 DIAGNOSIS — K7031 Alcoholic cirrhosis of liver with ascites: Secondary | ICD-10-CM | POA: Diagnosis not present

## 2021-12-13 NOTE — Patient Instructions (Signed)
Dr Carlean Purl will be back in touch about your need for an EGD.   Please make a follow up appointment with Joshua Drape, NP.   Due to recent changes in healthcare laws, you may see the results of your imaging and laboratory studies on MyChart before your provider has had a chance to review them.  We understand that in some cases there may be results that are confusing or concerning to you. Not all laboratory results come back in the same time frame and the provider may be waiting for multiple results in order to interpret others.  Please give [place] 48 hours in order for your provider to thoroughly review all the results before contacting the office for clarification of your results.    I appreciate the opportunity to care for you. Joshua Rusk, MD

## 2021-12-13 NOTE — Progress Notes (Unsigned)
Joshua Hubbard 46 y.o. May 19, 1975 [id number]  Assessment & Plan:   Encounter Diagnoses  Name Primary?   Secondary esophageal varices with bleeding (HCC) Yes   Alcoholic cirrhosis of liver with ascites (HCC)    Encephalopathy, hepatic (HCC)    Colon cancer screening    Given that he has had bleeding from the varices before I believe the current standard of practice is to continue surveillance EGD about every year even though he is on a beta-blocker.  Pulse in the 70s but his mean arterial pressure was 80 so would not titrate upwards.  Consider changing to carvedilol.  Not discussed today is that he is in the age range to have a colonoscopy.  I will review with him subsequently.  I do not think it is essential right now as the 66 age is a grade B recommendation.  Would want to do by age 55.   Continue other current therapy and follow-up with [facility], he is due for follow-up at about this time.  I have recommended he try to find ways to do resistance or weight training to reduce the chances of loss of muscle mass.  CC: [name], MD [facility],  NP  Subjective:  Gastroenterology problem summary: Alcoholic cirrhosis-decompensated with ascites, hepatic encephalopathy and variceal bleeding, initially seen in the hospital by [facility] 10/14/2018  January 2020 1 GI bleeding, variceal ligation and again in 12/14/2019 Last EGD 10/13/2020 small varices without stigmata of bleeding he is on propranolol 10 mg twice daily since 2021 also has mild portal hypertensive gastropathy  Hepatic encephalopathy treated with lactulose, Xifaxan cost prohibitive  Liver lesion question mass, evaluation in [place] and at [place], most recent imaging without significant liver lesion no signs of HCC MRI abdomen June 2023  Ascites prior paracenteses no SBP, currently on furosemide and Epple on her own (spironolactone caused gynecomastia)  Abstinent since 2021  Immune to hepatitis a  and B  Followed [facility] also starting May 2023   Chief Complaint: Follow-up history of varices and cirrhosis  HPI [name] is a 46 year old white man with the problems as above who presents to discuss repeating a surveillance endoscopy due to his history of GI bleeding from esophageal varices.  He has not noted any problems with bleeding, he reports lactulose helps significantly with concentration issues and some with fatigue.  He is hoping to have his endoscopy before the end of the year because of deductibles being met.  There is no melena or signs of bleeding.  He is walking about 2 miles a day.  Rotator cuff tear has limited resistance work.   Now [facility], had an MRI July 01, 2021 IMPRESSION: 1. Similar appearance of hepatic cirrhosis and fibrosis. Stable hepatic hemangioma. No new suspicious hepatic mass identified. 2. Evidence of portal hypertension with splenomegaly and varices. 3. Cholelithiasis. 4. Small volume ascites.   Visit w/ Roosevelt Locks, NP (Liver clinic) 05/24/2021 - reviewed-MELD was 16 then Marcello Moores score B  PEth test was negative confirming sobriety then  Bilirubin 2.5 total, alk phos 140 AST 53 ALT 41 INR 1.3 Sodium 139 Creatinine 0.72  Allergies  Allergen Reactions   Spironolactone Other (See Comments)    Gynecomastia    Tramadol     Made him feel hot and shakey   Current Meds  Medication Sig   dicyclomine (BENTYL) 10 MG capsule Take 1 capsule (10 mg total) by mouth up to 3 (  three) times daily as needed for esophageal pain.   eplerenone (INSPRA) 50 MG tablet Take 1 tablet (50 mg total) by mouth daily.   furosemide (LASIX) 20 MG tablet Take 1 tablet (20 mg total) by mouth daily.   lactulose (CHRONULAC) 10 GM/15ML solution Take 45 mLs (30 g total) by mouth 2 (two) times daily.   Multiple Vitamin (MULTIVITAMIN WITH MINERALS) TABS tablet Take 1 tablet by mouth daily.   oxyCODONE (ROXICODONE) 15 MG  immediate release tablet Take 1 tablet (15 mg total) by mouth 2 (two) times daily as needed for muskoskeletal pain   pantoprazole (PROTONIX) 40 MG tablet Take 1 tablet (40 mg total) by mouth 2 (two) times daily.   propranolol (INDERAL) 10 MG tablet Take 1 tablet (10 mg total) by mouth 2 (two) times daily.   [DISCONTINUED] influenza vac split quadrivalent PF (FLUARIX QUADRIVALENT) 0.5 ML injection Inject 0.5 mLs into the muscle.   Past Medical History:  Diagnosis Date   Arthritis    Blood transfusion without reported diagnosis    recevied 4 units PRBC and FFP during hospitalization from 1/17-1/22/21   Cirrhosis ([place])    Hypertension    PONV (postoperative nausea and vomiting)    Tears of meniscus and ACL of left knee 03/19/2018   Past Surgical History:  Procedure Laterality Date   ARTHROSCOPIC REPAIR ACL     x2   BIOPSY  10/05/2018   Procedure: BIOPSY;  Surgeon: Ronald Lobo, MD;  Location: [place];  Service: Endoscopy;;   ESOPHAGEAL BANDING  02/12/2019   Procedure: ESOPHAGEAL BANDING;  Surgeon: [name], MD;  Location: [place] [facility];  Service: Gastroenterology;;   ESOPHAGEAL BANDING  12/15/2019   Procedure: ESOPHAGEAL BANDING;  Surgeon: Clarene Essex, MD;  Location: [place];  Service: Endoscopy;;   ESOPHAGOGASTRODUODENOSCOPY (EGD) WITH PROPOFOL N/A 10/05/2018   Procedure: ESOPHAGOGASTRODUODENOSCOPY (EGD) WITH PROPOFOL;  Surgeon: Ronald Lobo, MD;  Location: [facility] END[facility]COPY;  Service: Endoscopy;  Laterality: N/A;   ESOPHAGOGASTRODUODENOSCOPY (EGD) WITH PROPOFOL N/A 02/12/2019   Procedure: ESOPHAGOGASTRODUODENOSCOPY (EGD) WITH PROPOFOL;  Surgeon: [name], MD;  Location: [place];  Service: Gastroenterology;  Laterality: N/A;   ESOPHAGOGASTRODUODENOSCOPY (EGD) WITH PROPOFOL N/A 12/15/2019   Procedure: ESOPHAGOGASTRODUODENOSCOPY (EGD) WITH PROPOFOL;  Surgeon: Clarene Essex, MD;  Location: [place];  Service: Endoscopy;  Laterality: N/A;    ESOPHAGOGASTRODUODENOSCOPY (EGD) WITH PROPOFOL N/A 10/13/2020   Procedure: ESOPHAGOGASTRODUODENOSCOPY (EGD) WITH PROPOFOL;  Surgeon: Gatha Mayer, MD;  Location: [facility];  Service: Endoscopy;  Laterality: N/A;   HERNIA REPAIR     x3   IR PARACENTESIS  12/20/2019   IR PARACENTESIS  12/25/2019   IR PARACENTESIS  01/07/2020   IR PARACENTESIS  01/10/2020   IR PARACENTESIS  01/15/2020   IR PARACENTESIS  01/21/2020   IR PARACENTESIS  01/29/2020   IR PARACENTESIS  02/18/2020   KNEE ARTHROSCOPY WITH ANTERIOR CRUCIATE LIGAMENT (ACL) REPAIR WITH HAMSTRING GRAFT Left 03/20/2018   Procedure: KNEE ARTHROSCOPY WITH REVESION ANTERIOR CRUCIATE LIGAMENT (ACL) REPAIR WITH  HAMSTRING ALLOGRAFT,;  Surgeon: Elsie Saas, MD;  Location: [place];  Service: Orthopedics;  Laterality: Left;   KNEE ARTHROSCOPY WITH LATERAL MENISECTOMY Left 03/20/2018   Procedure: KNEE ARTHROSCOPY WITH LATERAL MENISECTOMY;  Surgeon: Elsie Saas, MD;  Location: [place];  Service: Orthopedics;  Laterality: Left;   KNEE ARTHROSCOPY WITH MEDIAL MENISECTOMY Left 03/20/2018   Procedure: KNEE ARTHROSCOPY WITH MEDIAL MENISECTOMY;  Surgeon: Elsie Saas, MD;  Location: [place];  Service: Orthopedics;  Laterality: Left;  Social History   Social History Narrative   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   Review of Systems Chronic right rotator cuff pain-tear  Objective:   Physical Exam BP 118/62   Pulse 74   Wt 166 lb (75.3 kg)   BMI 24.51 kg/m   Well-developed well-nourished no acute distress Alert and oriented x3 without asterixis Lungs are clear throughout Heart sounds are normal with S1-S2 no rubs murmurs or gallops The abdomen is slightly protuberant without obvious ascites though it could be present, the liver is firm and palpable about 2 fingerbreadths below the right upper costal margin, he has some floating ribs palpated on the left but no splenomegaly and no tenderness.  There is a diastases recti. Extremities without cyanosis clubbing or edema

## 2021-12-15 ENCOUNTER — Ambulatory Visit: Payer: 59 | Admitting: Family Medicine

## 2021-12-15 ENCOUNTER — Encounter: Payer: Self-pay | Admitting: Family Medicine

## 2021-12-15 VITALS — BP 112/60 | HR 49 | Ht 69.0 in | Wt 164.6 lb

## 2021-12-15 DIAGNOSIS — L720 Epidermal cyst: Secondary | ICD-10-CM | POA: Diagnosis not present

## 2021-12-15 NOTE — Progress Notes (Signed)
    CHIEF COMPLAINT / HPI: Lesion on his back has been bothering him much more.  It is painful if he leans back in a chair against that area.  We have previously talked about possibly excising it and he is here today for further discussion and possible procedure on that.   PERTINENT  PMH / PSH: I have reviewed the patient's medications, allergies, past medical and surgical history, smoking status and updated in the EMR as appropriate.   OBJECTIVE:  BP 112/60   Pulse (!) 49   Ht '5\' 9"'$  (1.753 m)   Wt 164 lb 9.6 oz (74.7 kg)   SpO2 98%   BMI 24.31 kg/m  BACK: Skin on the back is normal with area of 2 clogged epidermal cyst in the central area right over the spine.  There are some mild erythema around this. PROCEDURE NOTE: Patient given informed consent copy in the chart.  Area prepped and draped in usual sterile fashion.  1 cc of 2% lidocaine with epinephrine used for local anesthesia.  2 incisions with 3 mm punch biopsy were made side-to-side over the epidermoid cyst.  The cyst was removed in entirety.  Hemostasis was obtained with pressure.  Band-Aid applied.  ASSESSMENT / PLAN: Epidermoid cyst.  No problem-specific Assessment & Plan notes found for this encounter.   Dorcas Mcmurray MD

## 2021-12-28 ENCOUNTER — Other Ambulatory Visit: Payer: Self-pay | Admitting: Family Medicine

## 2021-12-28 MED ORDER — OXYCODONE HCL 15 MG PO TABS: 15.0000 mg | ORAL_TABLET | Freq: Two times a day (BID) | ORAL | 0 refills | Status: DC | PRN

## 2021-12-31 ENCOUNTER — Ambulatory Visit (INDEPENDENT_AMBULATORY_CARE_PROVIDER_SITE_OTHER): Payer: 59 | Admitting: Family Medicine

## 2021-12-31 VITALS — BP 106/76 | Ht 69.0 in

## 2021-12-31 DIAGNOSIS — S46011D Strain of muscle(s) and tendon(s) of the rotator cuff of right shoulder, subsequent encounter: Secondary | ICD-10-CM

## 2021-12-31 MED ORDER — METHYLPREDNISOLONE ACETATE 40 MG/ML IJ SUSP
40.0000 mg | Freq: Once | INTRAMUSCULAR | Status: AC
  Administered 2021-12-31: 40 mg via INTRA_ARTICULAR

## 2021-12-31 NOTE — Progress Notes (Signed)
  Joshua Hubbard - 46 y.o. male MRN [id number]  Date of birth: [birth year]    SUBJECTIVE:      Chief Complaint:/ HPI:    Right shoulder pain.  He has known rotator cuff issues on that side in his orthopedist has not recommended any further surgical intervention.  He would like to consider corticosteroid injection in this shoulder as the one we gave him and the other shoulder really seem to help.   OBJECTIVE: BP 106/76   Ht '5\' 9"'$  (1.753 m)   BMI 24.31 kg/m   Physical Exam:  Vital signs are reviewed. GENERAL: Well-developed male no acute distress Shoulder: Right.  Pain with forward flexion and abduction above 90 degrees.  Bicep tendon without any tenderness to palpation.  PROCEDURE: INJECTION: Patient was given informed consent, signed copy in the chart. Appropriate time out was taken. Area prepped and draped in usual sterile fashion. Ethyl chloride was  used for local anesthesia. A 21 gauge 1 1/2 inch needle was used..  1 cc of methylprednisolone 40 mg/ml plus 4 cc of 1% lidocaine without epinephrine was injected into the right shoulder subacromial bursa using a(n) posterior approach.   The patient tolerated the procedure well. There were no complications. Post procedure instructions were given.   ASSESSMENT & PLAN:  See problem based charting & AVS for pt instructions. No problem-specific Assessment & Plan notes found for this encounter. Right shoulder pain: Known rotator cuff issues.  Will try corticosteroid injection today in the subacromial bursa to see if that gives him some relief.  His orthopedist is said he is not really excited about doing any more surgery at the moment so we will try this for some symptomatic relief.

## 2022-01-04 ENCOUNTER — Telehealth: Payer: Self-pay | Admitting: Internal Medicine

## 2022-01-04 NOTE — Telephone Encounter (Signed)
Please contact the patient and offer an EGD appointment for his esophageal varices at [facility] in my open spot on 12/27

## 2022-01-05 NOTE — Telephone Encounter (Unsigned)
Left message for pt to call back. My Chart message sent to pt.  

## 2022-01-06 NOTE — Telephone Encounter (Signed)
Appointment date and time has been filled: Pt to remain on wait list:

## 2022-01-14 ENCOUNTER — Other Ambulatory Visit (HOSPITAL_COMMUNITY): Payer: Self-pay

## 2022-01-14 ENCOUNTER — Other Ambulatory Visit: Payer: Self-pay | Admitting: Nurse Practitioner

## 2022-01-14 DIAGNOSIS — K7030 Alcoholic cirrhosis of liver without ascites: Secondary | ICD-10-CM

## 2022-01-14 DIAGNOSIS — R188 Other ascites: Secondary | ICD-10-CM | POA: Diagnosis not present

## 2022-01-14 DIAGNOSIS — I8511 Secondary esophageal varices with bleeding: Secondary | ICD-10-CM | POA: Diagnosis not present

## 2022-01-14 DIAGNOSIS — K766 Portal hypertension: Secondary | ICD-10-CM | POA: Diagnosis not present

## 2022-01-14 DIAGNOSIS — K7682 Hepatic encephalopathy: Secondary | ICD-10-CM | POA: Diagnosis not present

## 2022-01-14 DIAGNOSIS — K3189 Other diseases of stomach and duodenum: Secondary | ICD-10-CM

## 2022-01-14 DIAGNOSIS — Z9189 Other specified personal risk factors, not elsewhere classified: Secondary | ICD-10-CM | POA: Diagnosis not present

## 2022-01-14 MED ORDER — XIFAXAN 550 MG PO TABS
550.0000 mg | ORAL_TABLET | Freq: Two times a day (BID) | ORAL | 3 refills | Status: DC
  Filled 2022-01-14: qty 180, 90d supply, fill #0

## 2022-01-16 ENCOUNTER — Other Ambulatory Visit: Payer: Self-pay | Admitting: Family Medicine

## 2022-01-16 MED ORDER — OXYCODONE HCL 15 MG PO TABS: 15.0000 mg | ORAL_TABLET | Freq: Two times a day (BID) | ORAL | 0 refills | Status: DC | PRN

## 2022-01-19 ENCOUNTER — Other Ambulatory Visit (HOSPITAL_COMMUNITY): Payer: Self-pay

## 2022-02-07 ENCOUNTER — Other Ambulatory Visit: Payer: Self-pay | Admitting: Family Medicine

## 2022-02-07 MED ORDER — OXYCODONE HCL 15 MG PO TABS: 15.0000 mg | ORAL_TABLET | Freq: Two times a day (BID) | ORAL | 0 refills | Status: DC | PRN

## 2022-02-08 ENCOUNTER — Other Ambulatory Visit: Payer: Commercial Managed Care - PPO

## 2022-02-10 ENCOUNTER — Telehealth: Payer: Self-pay

## 2022-02-10 NOTE — Telephone Encounter (Signed)
Left message for pt to call back

## 2022-02-10 NOTE — Telephone Encounter (Signed)
Left message for pt to call back to schedule EGD.

## 2022-02-11 NOTE — Telephone Encounter (Signed)
Unable to reach pt to schedule for EGD on 02/15/2022 [facility]

## 2022-02-11 NOTE — Telephone Encounter (Signed)
Left Message for pt to call back  °

## 2022-02-11 NOTE — Telephone Encounter (Signed)
Left message for pt to call back

## 2022-02-16 ENCOUNTER — Ambulatory Visit: Payer: 59 | Admitting: Family Medicine

## 2022-02-22 ENCOUNTER — Telehealth: Payer: Self-pay | Admitting: Internal Medicine

## 2022-02-22 DIAGNOSIS — I8511 Secondary esophageal varices with bleeding: Secondary | ICD-10-CM

## 2022-02-22 NOTE — Telephone Encounter (Signed)
Please contact patient and offer a February 12 appointment for EGD at [facility] due to esophageal varices.

## 2022-02-23 ENCOUNTER — Other Ambulatory Visit: Payer: Self-pay

## 2022-02-23 DIAGNOSIS — I8511 Secondary esophageal varices with bleeding: Secondary | ICD-10-CM

## 2022-02-23 NOTE — Telephone Encounter (Signed)
See note below just [facility]

## 2022-02-23 NOTE — Telephone Encounter (Signed)
Pt made aware of Dr. Carlean Purl recommendations: Pt stated that he was available for Feb 12th Pt was ordered and scheduled EGD with Dr. Carlean Purl on 03/07/2022  at [facility] at 8:30 AM, Pt to arrive at 7:00: Pt made aware Case  ID [id number] Ambulatory referral to GI placed. Prep instructions created and sent to pt via my chart. Pt made aware.  Pt verbalized understanding with all questions answered.

## 2022-02-25 ENCOUNTER — Encounter: Payer: Self-pay | Admitting: Family Medicine

## 2022-02-25 ENCOUNTER — Other Ambulatory Visit (HOSPITAL_COMMUNITY): Payer: Self-pay

## 2022-02-25 ENCOUNTER — Ambulatory Visit (INDEPENDENT_AMBULATORY_CARE_PROVIDER_SITE_OTHER): Payer: 59 | Admitting: Family Medicine

## 2022-02-25 VITALS — BP 122/60 | Ht 69.0 in | Wt 165.2 lb

## 2022-02-25 DIAGNOSIS — M25512 Pain in left shoulder: Secondary | ICD-10-CM

## 2022-02-25 MED ORDER — PROPRANOLOL HCL 10 MG PO TABS: 10.0000 mg | ORAL_TABLET | Freq: Two times a day (BID) | ORAL | 3 refills | Status: DC

## 2022-02-25 MED ORDER — OXYCODONE HCL 15 MG PO TABS: 15.0000 mg | ORAL_TABLET | Freq: Two times a day (BID) | ORAL | 0 refills | Status: DC | PRN

## 2022-02-25 MED ORDER — LACTULOSE 10 GM/15ML PO SOLN
30.0000 g | Freq: Two times a day (BID) | ORAL | 11 refills | Status: DC
  Filled 2022-02-25: qty 3784, 42d supply, fill #0

## 2022-02-25 NOTE — Progress Notes (Signed)
  Joshua Hubbard - 47 y.o. male MRN [id number]  Date of birth: [birth year]    SUBJECTIVE:      Chief Complaint:/ HPI:  #1.  Left shoulder pain: Continues to have significant problems particularly at night trying to sleep.  Cannot find a comfortable position for either shoulder but the left one is much worse.  He is seen orthopedist and they recommended no surgical intervention at this time.  He has questions about home exercise program. 2.  Questions about upcoming screening for hepatocellular carcinoma: [name] the last time he had abdominal ultrasound it was quite painful.  He has been putting off regular screening because of that.  Wants to discuss the importance. 3.  Continues to have intermittent chest and abdominal pains particularly at night.  These are unchanged in frequency or severity. 4.  Needs some refills.    OBJECTIVE: BP 122/60   Ht '5\' 9"'$  (1.753 m)   Wt 165 lb 3.2 oz (74.9 kg)   BMI 24.40 kg/m   Physical Exam:  Vital signs are reviewed. GENERAL: Well-developed male no acute distress Abdomen: No fluid wave.  Soft.  Protuberant.  No specific area of tenderness on palpation. CV: Regular rate and rhythm MSK: Left shoulder pain with abduction above 90 degrees, forward flexion above 90 degrees.  Mildly weak in forward flexion.  ASSESSMENT & PLAN: #1.  Shoulder pain: Getting back on home exercise program for rotator cuff.  Think I would try to avoid surgery and maximize rehab. 2.  Discussed necessary screening for his current situation.  Will give him 5 Vicodin for both his upcoming ultrasound and his EGD as he had quite a bit of pain past EGD the day after when they did some banding.  He continues to be abstinent. See problem based charting & AVS for pt instructions. No problem-specific Assessment & Plan notes found for this encounter.

## 2022-02-28 ENCOUNTER — Encounter (HOSPITAL_COMMUNITY): Payer: Self-pay | Admitting: Internal Medicine

## 2022-02-28 ENCOUNTER — Other Ambulatory Visit (HOSPITAL_COMMUNITY): Payer: Self-pay

## 2022-03-03 ENCOUNTER — Other Ambulatory Visit: Payer: Self-pay

## 2022-03-06 NOTE — Anesthesia Preprocedure Evaluation (Signed)
Anesthesia Evaluation  Patient identified by MRN, date of birth, ID band Patient awake    Reviewed: Allergy & Precautions, NPO status , Patient's Chart, lab work & pertinent test results  History of Anesthesia Complications (+) PONV and history of anesthetic complications  Airway Mallampati: I  TM Distance: >3 FB Neck ROM: Full    Dental  (+) Dental Advisory Given   Pulmonary neg pulmonary ROS   Pulmonary exam normal        Cardiovascular hypertension, Pt. on home beta blockers and Pt. on medications Normal cardiovascular exam     Neuro/Psych  Tinnitus   negative psych ROS   GI/Hepatic negative GI ROS,,,(+) Cirrhosis     substance abuse (sober since 2021)  alcohol use  Endo/Other  negative endocrine ROS    Renal/GU negative Renal ROS     Musculoskeletal  (+) Arthritis ,    Abdominal   Peds  Hematology negative hematology ROS (+)   Anesthesia Other Findings   Reproductive/Obstetrics                             Anesthesia Physical Anesthesia Plan  ASA: 3  Anesthesia Plan: MAC   Post-op Pain Management:    Induction:   PONV Risk Score and Plan: 2 and Propofol infusion and Treatment may vary due to age or medical condition  Airway Management Planned: Nasal Cannula and Natural Airway  Additional Equipment: None  Intra-op Plan:   Post-operative Plan:   Informed Consent: I have reviewed the patients History and Physical, chart, labs and discussed the procedure including the risks, benefits and alternatives for the proposed anesthesia with the patient or authorized representative who has indicated his/her understanding and acceptance.       Plan Discussed with: CRNA and Anesthesiologist  Anesthesia Plan Comments:        Anesthesia Quick Evaluation

## 2022-03-07 ENCOUNTER — Encounter (HOSPITAL_COMMUNITY): Payer: Self-pay | Admitting: Internal Medicine

## 2022-03-07 ENCOUNTER — Ambulatory Visit (HOSPITAL_BASED_OUTPATIENT_CLINIC_OR_DEPARTMENT_OTHER): Payer: 59 | Admitting: Anesthesiology

## 2022-03-07 ENCOUNTER — Encounter (HOSPITAL_COMMUNITY)
Admission: RE | Disposition: A | Discharge status: Home / Self Care | Payer: Self-pay | Source: Ambulatory Visit | Attending: Internal Medicine

## 2022-03-07 ENCOUNTER — Ambulatory Visit (HOSPITAL_COMMUNITY): Payer: 59 | Admitting: Anesthesiology

## 2022-03-07 ENCOUNTER — Ambulatory Visit (HOSPITAL_COMMUNITY)
Admission: RE | Admit: 2022-03-07 | Discharge: 2022-03-07 | Disposition: A | Discharge status: Home / Self Care | Payer: 59 | Source: Ambulatory Visit | Attending: Internal Medicine | Admitting: Internal Medicine

## 2022-03-07 DIAGNOSIS — K3189 Other diseases of stomach and duodenum: Secondary | ICD-10-CM | POA: Insufficient documentation

## 2022-03-07 DIAGNOSIS — I8510 Secondary esophageal varices without bleeding: Secondary | ICD-10-CM | POA: Insufficient documentation

## 2022-03-07 DIAGNOSIS — I10 Essential (primary) hypertension: Secondary | ICD-10-CM | POA: Insufficient documentation

## 2022-03-07 DIAGNOSIS — K317 Polyp of stomach and duodenum: Secondary | ICD-10-CM | POA: Diagnosis not present

## 2022-03-07 DIAGNOSIS — I8511 Secondary esophageal varices with bleeding: Secondary | ICD-10-CM | POA: Diagnosis not present

## 2022-03-07 DIAGNOSIS — K766 Portal hypertension: Secondary | ICD-10-CM | POA: Insufficient documentation

## 2022-03-07 DIAGNOSIS — Z09 Encounter for follow-up examination after completed treatment for conditions other than malignant neoplasm: Secondary | ICD-10-CM | POA: Diagnosis not present

## 2022-03-07 DIAGNOSIS — K7460 Unspecified cirrhosis of liver: Secondary | ICD-10-CM

## 2022-03-07 HISTORY — PX: ESOPHAGEAL BANDING: SHX5518

## 2022-03-07 HISTORY — PX: ESOPHAGOGASTRODUODENOSCOPY (EGD) WITH PROPOFOL: SHX5813

## 2022-03-07 HISTORY — PX: BIOPSY: SHX5522

## 2022-03-07 SURGERY — ESOPHAGOGASTRODUODENOSCOPY (EGD) WITH PROPOFOL
Anesthesia: Monitor Anesthesia Care

## 2022-03-07 MED ORDER — PROPOFOL 500 MG/50ML IV EMUL
INTRAVENOUS | Status: AC
  Filled 2022-03-07: qty 50

## 2022-03-07 MED ORDER — PROPOFOL 500 MG/50ML IV EMUL
INTRAVENOUS | Status: DC | PRN
  Administered 2022-03-07: 150 ug/kg/min via INTRAVENOUS

## 2022-03-07 MED ORDER — LACTATED RINGERS IV SOLN: INTRAVENOUS | Status: DC

## 2022-03-07 MED ORDER — PROPOFOL 1000 MG/100ML IV EMUL
INTRAVENOUS | Status: AC
  Filled 2022-03-07: qty 100

## 2022-03-07 MED ORDER — PROPOFOL 10 MG/ML IV BOLUS
INTRAVENOUS | Status: DC | PRN
  Administered 2022-03-07: 30 mg via INTRAVENOUS
  Administered 2022-03-07 (×2): 20 mg via INTRAVENOUS
  Administered 2022-03-07: 10 mg via INTRAVENOUS
  Administered 2022-03-07: 20 mg via INTRAVENOUS

## 2022-03-07 MED ORDER — LIDOCAINE 2% (20 MG/ML) 5 ML SYRINGE
INTRAMUSCULAR | Status: DC | PRN
  Administered 2022-03-07: 100 mg via INTRAVENOUS

## 2022-03-07 SURGICAL SUPPLY — 15 items

## 2022-03-07 NOTE — Discharge Instructions (Addendum)
I banded the varices today as they had gotten larger. There were some tiny polyp lesions seen and biopsied (stomach). I am not suspecting a significant issue but wanted to check.  Once pathology returns will communicate results. You will need to come back in April for a recheck of the varices. We will work on a colonoscopy after that.  I appreciate the opportunity to care for you. Gatha Mayer, MD, FACG   YOU HAD AN ENDOSCOPIC PROCEDURE TODAY: Refer to the procedure report and other information in the discharge instructions given to you for any specific questions about what was found during the examination. If this information does not answer your questions, please call Dr. Celesta Aver office at [phone] to clarify.   YOU SHOULD EXPECT: Some feelings of bloating in the abdomen. Passage of more gas than usual. Walking can help get rid of the air that was put into your GI tract during the procedure and reduce the bloating. If you had a lower endoscopy (such as a colonoscopy or flexible sigmoidoscopy) you may notice spotting of blood in your stool or on the toilet paper. Some abdominal soreness may be present for a day or two, also.  DIET: Your first meal following the procedure should be a light meal and then it is ok to progress to your normal diet. A half-sandwich or bowl of soup is an example of a good first meal. Heavy or fried foods are harder to digest and may make you feel nauseous or bloated. Drink plenty of fluids but you should avoid alcoholic beverages for 24 hours.   ACTIVITY: Your care partner should take you home directly after the procedure. You should plan to take it easy, moving slowly for the rest of the day. You can resume normal activity the day after the procedure however YOU SHOULD NOT DRIVE, use power tools, machinery or perform tasks that involve climbing or major physical exertion for 24 hours (because of the sedation medicines used during the test).   SYMPTOMS TO REPORT  IMMEDIATELY: A gastroenterologist can be reached at any hour. Please call [phone]  for any of the following symptoms:   Following upper endoscopy (EGD, EUS, ERCP, esophageal dilation) Vomiting of blood or coffee ground material  New, significant abdominal pain  New, significant chest pain or pain under the shoulder blades  Painful or persistently difficult swallowing  New shortness of breath  Black, tarry-looking or red, bloody stools  FOLLOW UP:  If any biopsies were taken you will be contacted by phone or by letter within the next 1-3 weeks. Call [phone]  if you have not heard about the biopsies in 3 weeks.  Please also call with any specific questions about appointments or follow up tests.

## 2022-03-07 NOTE — Transfer of Care (Signed)
Immediate Anesthesia Transfer of Care Note  Patient: Joshua Hubbard  Procedure(s) Performed: ESOPHAGOGASTRODUODENOSCOPY (EGD) WITH PROPOFOL BIOPSY  Patient Location: PACU  Anesthesia Type:MAC  Level of Consciousness: sedated  Airway & Oxygen Therapy: Patient Spontanous Breathing and Patient connected to face mask oxygen  Post-op Assessment: Report given to RN and Post -op Vital signs reviewed and stable  Post vital signs: Reviewed and stable  Last Vitals:  Vitals Value Taken Time  BP    Temp    Pulse 81 03/07/22 0845  Resp 18 03/07/22 0845  SpO2 99 % 03/07/22 0845  Vitals shown include unvalidated device data.  Last Pain:  Vitals:   03/07/22 0722  TempSrc: Temporal  PainSc: 0-No pain         Complications: No notable events documented.

## 2022-03-07 NOTE — H&P (Signed)
[facility] History and Physical   Primary Care Physician:  Joshua La, MD   Reason for Procedure:   Hx varices and bleeding  Plan:    EGd, possible ligation of varices     HPI: Joshua Hubbard is a 47 y.o. male here for surveillance and possible banding of esophageal varices in cirrhosis.   Past Medical History:  Diagnosis Date   Arthritis    Blood transfusion without reported diagnosis    recevied 4 units PRBC and FFP during hospitalization from 1/17-1/22/21   Cirrhosis ([place])    Hypertension    PONV (postoperative nausea and vomiting)    Tears of meniscus and ACL of left knee 03/19/2018    Past Surgical History:  Procedure Laterality Date   ARTHROSCOPIC REPAIR ACL     x2   BIOPSY  10/05/2018   Procedure: BIOPSY;  Surgeon: Joshua Lobo, MD;  Location: [place];  Service: Endoscopy;;   ESOPHAGEAL BANDING  02/12/2019   Procedure: ESOPHAGEAL BANDING;  Surgeon: Joshua Brace, MD;  Location: [facility];  Service: Gastroenterology;;   ESOPHAGEAL BANDING  12/15/2019   Procedure: ESOPHAGEAL BANDING;  Surgeon: Joshua Essex, MD;  Location: [place];  Service: Endoscopy;;   ESOPHAGOGASTRODUODENOSCOPY (EGD) WITH PROPOFOL N/A 10/05/2018   Procedure: ESOPHAGOGASTRODUODENOSCOPY (EGD) WITH PROPOFOL;  Surgeon: Joshua Lobo, MD;  Location: [facility] END[facility]COPY;  Service: Endoscopy;  Laterality: N/A;   ESOPHAGOGASTRODUODENOSCOPY (EGD) WITH PROPOFOL N/A 02/12/2019   Procedure: ESOPHAGOGASTRODUODENOSCOPY (EGD) WITH PROPOFOL;  Surgeon: Joshua Brace, MD;  Location: [place];  Service: Gastroenterology;  Laterality: N/A;   ESOPHAGOGASTRODUODENOSCOPY (EGD) WITH PROPOFOL N/A 12/15/2019   Procedure: ESOPHAGOGASTRODUODENOSCOPY (EGD) WITH PROPOFOL;  Surgeon: Joshua Essex, MD;  Location: [place];  Service: Endoscopy;  Laterality: N/A;   ESOPHAGOGASTRODUODENOSCOPY (EGD) WITH PROPOFOL N/A 10/13/2020   Procedure: ESOPHAGOGASTRODUODENOSCOPY (EGD) WITH PROPOFOL;   Surgeon: Joshua Mayer, MD;  Location: [facility]OSCOPY;  Service: Endoscopy;  Laterality: N/A;   HERNIA REPAIR     x3   IR PARACENTESIS  12/20/2019   IR PARACENTESIS  12/25/2019   IR PARACENTESIS  01/07/2020   IR PARACENTESIS  01/10/2020   IR PARACENTESIS  01/15/2020   IR PARACENTESIS  01/21/2020   IR PARACENTESIS  01/29/2020   IR PARACENTESIS  02/18/2020   KNEE ARTHROSCOPY WITH ANTERIOR CRUCIATE LIGAMENT (ACL) REPAIR WITH HAMSTRING GRAFT Left 03/20/2018   Procedure: KNEE ARTHROSCOPY WITH REVESION ANTERIOR CRUCIATE LIGAMENT (ACL) REPAIR WITH  HAMSTRING ALLOGRAFT,;  Surgeon: Joshua Saas, MD;  Location: [place];  Service: Orthopedics;  Laterality: Left;   KNEE ARTHROSCOPY WITH LATERAL MENISECTOMY Left 03/20/2018   Procedure: KNEE ARTHROSCOPY WITH LATERAL MENISECTOMY;  Surgeon: Joshua Saas, MD;  Location: [place];  Service: Orthopedics;  Laterality: Left;   KNEE ARTHROSCOPY WITH MEDIAL MENISECTOMY Left 03/20/2018   Procedure: KNEE ARTHROSCOPY WITH MEDIAL MENISECTOMY;  Surgeon: Joshua Saas, MD;  Location: [place];  Service: Orthopedics;  Laterality: Left;    Prior to Admission medications   Medication Sig Start Date End Date Taking? Authorizing Provider  eplerenone (INSPRA) 50 MG tablet Take 1 tablet (50 mg total) by mouth daily. 12/11/20  Yes Joshua La, MD  furosemide (LASIX) 20 MG tablet Take 1 tablet (20 mg total) by mouth daily. Patient taking differently: Take 20 mg by mouth at bedtime. 02/12/21  Yes Joshua La, MD  lactulose (CHRONULAC) 10 GM/15ML solution Take 45 mLs (30 g total) by mouth 2 (two) times daily. Patient taking differently: Take 10 g by mouth  2 (two) times daily. 02/25/22  Yes Joshua La, MD  oxyCODONE (ROXICODONE) 15 MG immediate release tablet Take 1 tablet (15 mg total) by mouth 2 (two) times daily as needed for muskoskeletal pain 02/25/22  Yes Joshua La, MD  pantoprazole (PROTONIX) 40 MG tablet Take 1 tablet  (40 mg total) by mouth 2 (two) times daily. 09/24/21  Yes Joshua La, MD  propranolol (INDERAL) 10 MG tablet Take 1 tablet (10 mg total) by mouth 2 (two) times daily. 02/25/22  Yes Joshua La, MD  Varenicline Tartrate (TYRVAYA) 0.03 MG/ACT SOLN Place 1 spray into the nose daily as needed (dry eyes).   Yes [provider]  rifaximin (XIFAXAN) 550 MG TABS tablet Take 1 tablet (550 mg total) by mouth 2 (two) times daily. Patient not taking: Reported on 03/04/2022 01/14/22     mirtazapine (REMERON) 15 MG tablet Take 1 tablet (15 mg total) by mouth at bedtime. 03/20/20 04/10/20  Joshua La, MD    Current Facility-Administered Medications  Medication Dose Route Frequency Provider Last Rate Last Admin   lactated ringers infusion   Intravenous Continuous Joshua Mayer, MD        Allergies as of 02/23/2022 - Review Complete 12/13/2021  Allergen Reaction Noted   Spironolactone Other (See Comments) 09/15/2020   Tramadol  05/05/2020    Family History  Problem Relation Age of Onset   Osteoarthritis Mother    Coronary artery disease Father    Hyperlipidemia Father     Social History   Socioeconomic History   Marital status: Divorced    Spouse name: Not on file   Number of children: Not on file   Years of education: Not on file   Highest education level: Not on file  Occupational History   Occupation: Patent attorney with Sports Medicine    Employer: [employer]  Tobacco Use   Smoking status: Never   Smokeless tobacco: Never  Vaping Use   Vaping Use: Never used  Substance and Sexual Activity   Alcohol use: Yes    Alcohol/week: 42.0 standard drinks of alcohol    Types: 42 Cans of beer per week   Drug use: No   Sexual activity: Not on file  Other Topics Concern   Not on file  Social History Narrative   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 [place]   He previously taught elementary school math for 16 years   He lives in  [place] with his wife and 2 daughters born 2007 and 2010 - divorced now   Former heavy alcohol user none in 2022 since late 2021   1 caffeinated beverage daily   Never smoker no other tobacco or drug use   Social Determinants of Radio broadcast assistant Strain: Not on file  Food Insecurity: Not on file  Transportation Needs: Not on file  Physical Activity: Not on file  Stress: Not on file  Social Connections: Not on file  Intimate Partner Violence: Not on file    Review of Systems: Musculoskeletal chest pain in past All other review of systems negative except as mentioned in the HPI.  Physical Exam: Vital signs BP (!) 127/55   Pulse 64   Temp 98.5 F (36.9 C) (Temporal)   Resp 14   Ht 5' 9"$  (1.753 m)   Wt 75.8 kg   SpO2 97%   BMI 24.66 kg/m   General:   Alert,  Well-developed, well-nourished, pleasant and  cooperative in NAD Lungs:  Clear throughout to auscultation.   Heart:  Regular rate and rhythm; no murmurs, clicks, rubs,  or gallops. Abdomen:  Soft, nontender and nondistended. Normal bowel sounds, suspect small ascites.   Neuro/Psych:  Alert and cooperative. Normal mood and affect. A and O x 3 Spider angiomata on chest wall   @Joshua Hubbard$  Joshua Maffucci, MD, [facility] [phone] (pager) 03/07/2022 8:16 AM@

## 2022-03-07 NOTE — Op Note (Signed)
[facility] Patient Name: Joshua Hubbard Procedure Date: 03/07/2022 MRN: [id number] Attending MD: Gatha Mayer , MD, [id number] Date of Birth: 1975-02-20 CSN: [id number] Age: 47 Admit Type: Inpatient Procedure:                Upper GI endoscopy Indications:              Esophageal varices, Follow-up of esophageal                            varices, For therapy of esophageal varices Providers:                Gatha Mayer, MD, Adah Perl RN, RN,                            Benetta Spar, Technician Referring MD:              Medicines:                Monitored Anesthesia Care Complications:            No immediate complications. Estimated Blood Loss:     Estimated blood loss was minimal. Procedure:                Pre-Anesthesia Assessment:                           - Prior to the procedure, a History and Physical                            was performed, and patient medications and                            allergies were reviewed. The patient's tolerance of                            previous anesthesia was also reviewed. The risks                            and benefits of the procedure and the sedation                            options and risks were discussed with the patient.                            All questions were answered, and informed consent                            was obtained. Prior Anticoagulants: The patient has                            taken no anticoagulant or antiplatelet agents. ASA                            Grade Assessment: III - A patient with severe  systemic disease. After reviewing the risks and                            benefits, the patient was deemed in satisfactory                            condition to undergo the procedure.                           After obtaining informed consent, the endoscope was                            passed under direct vision. Throughout the                             procedure, the patient's blood pressure, pulse, and                            oxygen saturations were monitored continuously. The                            [id number]) Olympus endoscope was introduced                            through the mouth, and advanced to the second part                            of duodenum. The upper GI endoscopy was                            accomplished without difficulty. The patient                            tolerated the procedure well. Scope In: Scope Out: Findings:      Grade II varices were found in the lower third of the esophagus. They       were 6 mm in largest diameter. Four bands were successfully placed with       complete eradication, resulting in deflation of varices. There was no       bleeding during and at the end of the procedure.      A few diminutive sessile polyps were found in the cardia and in the       gastric body. Biopsies were taken with a cold forceps for histology.       Verification of patient identification for the specimen was done.       Estimated blood loss was minimal.      Moderate portal hypertensive gastropathy was found in the entire       examined stomach.      The exam was otherwise without abnormality.      The cardia and gastric fundus were otherwise normal on retroflexion. Impression:               - Grade II esophageal varices. Completely  eradicated. Banded.                           - A few diminutive proximal gastric polyps.                            Biopsied. ? manifestion of portal gastropathy                           - Portal hypertensive gastropathy. Moderate -                            contact friablility                           - The examination was otherwise normal. Moderate Sedation:      Not Applicable - Patient had care per Anesthesia. Recommendation:           - Patient has a contact number available for                            emergencies. The  signs and symptoms of potential                            delayed complications were discussed with the                            patient. Return to normal activities tomorrow.                            Written discharge instructions were provided to the                            patient.                           - Resume previous diet.                           - Continue present medications.                           - Await pathology results.                           - repeat EGD on April 25 at [facility] to follow-up varices                           screening colonoscopy later this year                           discuss changing to carvedilol from nadolol as                            suggested by Roosevelt Locks, NP [facility] Procedure Code(s):        --- Professional ---  43244, Esophagogastroduodenoscopy, flexible,                            transoral; with band ligation of esophageal/gastric                            varices                           43239, Esophagogastroduodenoscopy, flexible,                            transoral; with biopsy, single or multiple Diagnosis Code(s):        --- Professional ---                           I85.00, Esophageal varices without bleeding                           K31.7, Polyp of stomach and duodenum                           K76.6, Portal hypertension                           K31.89, Other diseases of stomach and duodenum CPT copyright 2022 American Medical Association. All rights reserved. The codes documented in this report are preliminary and upon coder review may  be revised to meet current compliance requirements. Gatha Mayer, MD 03/07/2022 8:54:43 AM This report has been signed electronically. Number of Addenda: 0

## 2022-03-07 NOTE — Anesthesia Postprocedure Evaluation (Signed)
Anesthesia Post Note  Patient: Joshua Hubbard  Procedure(s) Performed: ESOPHAGOGASTRODUODENOSCOPY (EGD) WITH PROPOFOL BIOPSY ESOPHAGEAL BANDING     Patient location during evaluation: PACU Anesthesia Type: MAC Level of consciousness: awake and alert Pain management: pain level controlled Vital Signs Assessment: post-procedure vital signs reviewed and stable Respiratory status: spontaneous breathing, nonlabored ventilation and respiratory function stable Cardiovascular status: stable and blood pressure returned to baseline Anesthetic complications: no   No notable events documented.  Last Vitals:  Vitals:   03/07/22 0855 03/07/22 0905  BP: 124/69 (!) 114/55  Pulse: 78 65  Resp: 14 13  Temp:    SpO2: 94% 95%    Last Pain:  Vitals:   03/07/22 0905  TempSrc:   PainSc: 0-No pain                 Audry Pili

## 2022-03-08 LAB — SURGICAL PATHOLOGY

## 2022-03-09 ENCOUNTER — Encounter (HOSPITAL_COMMUNITY): Payer: Self-pay | Admitting: Internal Medicine

## 2022-03-10 DIAGNOSIS — Z20822 Contact with and (suspected) exposure to covid-19: Secondary | ICD-10-CM | POA: Diagnosis not present

## 2022-03-10 DIAGNOSIS — R509 Fever, unspecified: Secondary | ICD-10-CM | POA: Diagnosis not present

## 2022-03-10 DIAGNOSIS — J111 Influenza due to unidentified influenza virus with other respiratory manifestations: Secondary | ICD-10-CM | POA: Diagnosis not present

## 2022-03-17 ENCOUNTER — Ambulatory Visit (INDEPENDENT_AMBULATORY_CARE_PROVIDER_SITE_OTHER): Payer: 59 | Admitting: Sports Medicine

## 2022-03-17 DIAGNOSIS — J301 Allergic rhinitis due to pollen: Secondary | ICD-10-CM

## 2022-03-17 MED ORDER — TRIAMCINOLONE ACETONIDE 40 MG/ML IJ SUSP
60.0000 mg | Freq: Once | INTRAMUSCULAR | Status: AC
  Administered 2022-03-17: 60 mg via INTRAMUSCULAR

## 2022-03-17 NOTE — Progress Notes (Signed)
  Joshua Hubbard - 47 y.o. male MRN [id number]  Date of birth: 1975-04-01    CHIEF COMPLAINT:   Allergies    SUBJECTIVE:   HPI:  Pleasant 47 year old male with history of seasonal allergies comes to the clinic to seek treatment for his seasonal allergies.  He has quite symptomatic seasonal allergies.  He feels his allergies getting worse over the last several weeks.  Has some eye watering and nasal congestion.  He has tried over-the-counter antihistamines.  He usually does quite well with IM Kenalog injection and would like to have that done today.  No fever.  ROS:     See HPI  PERTINENT  PMH / PSH FH / / SH:  Past Medical, Surgical, Social, and Family History Reviewed & Updated in the EMR.  Pertinent findings include:  none  OBJECTIVE: There were no vitals taken for this visit.  Physical Exam:  Vital signs are reviewed.  GEN: Alert and oriented, NAD Pulm: Breathing unlabored PSY: normal mood, congruent affect  HEENT: Nasal mucosa boggy.  Oropharynx slightly erythematous, no exudate. Neck without lymphadenopathy  ASSESSMENT & PLAN:  1.  Seasonal allergic rhinitis  Will treat this with a repeat Kenalog injection and he will continue over-the-counter antihistamine such as Zyrtec.  Can follow-up as needed.   Dortha Kern, MD PGY-4, Sports Medicine Fellow [facility]  Addendum:  I was the preceptor for this visit and available for immediate consultation.  Joshua Lemon MD Kirt Boys

## 2022-03-21 ENCOUNTER — Other Ambulatory Visit: Payer: 59

## 2022-03-23 ENCOUNTER — Ambulatory Visit
Admission: RE | Admit: 2022-03-23 | Discharge: 2022-03-23 | Disposition: A | Discharge status: Home / Self Care | Payer: 59 | Source: Ambulatory Visit | Attending: Nurse Practitioner | Admitting: Nurse Practitioner

## 2022-03-23 DIAGNOSIS — R188 Other ascites: Secondary | ICD-10-CM

## 2022-03-23 DIAGNOSIS — K3189 Other diseases of stomach and duodenum: Secondary | ICD-10-CM

## 2022-03-23 DIAGNOSIS — K7030 Alcoholic cirrhosis of liver without ascites: Secondary | ICD-10-CM

## 2022-03-24 DIAGNOSIS — J301 Allergic rhinitis due to pollen: Secondary | ICD-10-CM | POA: Diagnosis not present

## 2022-03-24 MED ORDER — TRIAMCINOLONE ACETONIDE 40 MG/ML IJ SUSP: 40.0000 mg | Freq: Once | INTRAMUSCULAR | Status: DC

## 2022-03-24 MED ORDER — TRIAMCINOLONE ACETONIDE 40 MG/ML IJ SUSP
60.0000 mg | Freq: Once | INTRAMUSCULAR | Status: AC
  Administered 2022-03-24 (×2): 60 mg via INTRAMUSCULAR

## 2022-03-24 NOTE — Addendum Note (Signed)
Addended by: Cyd Silence on: 03/24/2022 09:19 AM   Modules accepted: Orders

## 2022-03-24 NOTE — Addendum Note (Signed)
Addended by: Cyd Silence on: 03/24/2022 09:21 AM   Modules accepted: Orders

## 2022-03-25 ENCOUNTER — Ambulatory Visit (INDEPENDENT_AMBULATORY_CARE_PROVIDER_SITE_OTHER): Payer: 59 | Admitting: Family Medicine

## 2022-03-25 ENCOUNTER — Other Ambulatory Visit: Payer: Self-pay | Admitting: Nurse Practitioner

## 2022-03-25 ENCOUNTER — Encounter: Payer: Self-pay | Admitting: Family Medicine

## 2022-03-25 ENCOUNTER — Other Ambulatory Visit (HOSPITAL_COMMUNITY): Payer: Self-pay

## 2022-03-25 VITALS — BP 138/64 | Ht 69.0 in | Wt 165.0 lb

## 2022-03-25 DIAGNOSIS — F1011 Alcohol abuse, in remission: Secondary | ICD-10-CM

## 2022-03-25 DIAGNOSIS — Z658 Other specified problems related to psychosocial circumstances: Secondary | ICD-10-CM

## 2022-03-25 DIAGNOSIS — K766 Portal hypertension: Secondary | ICD-10-CM | POA: Diagnosis not present

## 2022-03-25 DIAGNOSIS — Z9189 Other specified personal risk factors, not elsewhere classified: Secondary | ICD-10-CM | POA: Diagnosis not present

## 2022-03-25 DIAGNOSIS — D376 Neoplasm of uncertain behavior of liver, gallbladder and bile ducts: Secondary | ICD-10-CM

## 2022-03-25 DIAGNOSIS — K7030 Alcoholic cirrhosis of liver without ascites: Secondary | ICD-10-CM | POA: Diagnosis not present

## 2022-03-25 DIAGNOSIS — K769 Liver disease, unspecified: Secondary | ICD-10-CM | POA: Diagnosis not present

## 2022-03-25 DIAGNOSIS — K3189 Other diseases of stomach and duodenum: Secondary | ICD-10-CM

## 2022-03-25 MED ORDER — FUROSEMIDE 20 MG PO TABS: 20.0000 mg | ORAL_TABLET | Freq: Every day | ORAL | Status: DC

## 2022-03-25 MED ORDER — OXYCODONE HCL 15 MG PO TABS
15.0000 mg | ORAL_TABLET | Freq: Two times a day (BID) | ORAL | 0 refills | Status: DC | PRN
  Filled 2022-03-25: qty 15, 8d supply, fill #0

## 2022-03-25 NOTE — Assessment & Plan Note (Signed)
He is applauded once again for his continued abstinence.  We discussed his coping mechanisms.

## 2022-03-25 NOTE — Progress Notes (Signed)
Joshua Hubbard - 47 y.o. male MRN [id number]  Date of birth: November 24, 1975    SUBJECTIVE:      Chief Complaint:/ HPI:  #1.  Continue shoulder chest pain that is worse if he does any type of activity.  He wants to continue getting pain medication if he can but does not want to become dependent on this.  Request that we only give him 15 Mehta time.  Last month, we gave him a little extra because he had the EGD coming up and he was worried about the banding procedure.  He felt uncomfortable having those extra ones on hand. 2.  Follow-up recent EGD.  He has some questions.  GI has told him that would like to repeat this in May and he is not sure why.  He is also not sure what the endoscopy report really means in the setting of long-term cirrhosis.  Is he getting better or stable or worse? 3.  Lactulose is now increased from $4 a months to $50 a month.  His gastroenterologist gave him a prescription for a new medication but it is unfortunately about $200 a month.  He has some questions about whether he should try to shell out the money for that.    OBJECTIVE: BP 138/64   Ht '5\' 9"'$  (1.753 m)   Wt 165 lb (74.8 kg)   BMI 24.37 kg/m   Physical Exam:  Vital signs are reviewed. GENERAL: Well-developed male no acute distress CARDIOVASCULAR: Regular rate and rhythm LUNGS: Clear to auscultation bilaterally.  Normal work of breathing SHOULDERS: Left shoulder reveals weakness in supraspinatus testing and some pain with external rotation.  Right shoulder also has relative weakness in supraspinatus and forward flexion. PSYCH: AxOx4. Good eye contact.. No psychomotor retardation or agitation. Appropriate speech fluency and content. Asks and answers questions appropriately. Mood is congruent. ABDOMEN: Soft, positive bowel sounds.  No ascites is noted.  Nontender.  ASSESSMENT & PLAN:  See problem based charting & AVS for pt instructions. Liver cirrhosis ([place]) Reviewed his medications.  He says he cannot afford  the new medication which is Xifaxan.  They had given him a handout about most recent research on that and that it is superior to lactulose and preventing recurrent episode of hepatic encephalopathy.  We discussed at length.  Right now he is only able to continue the lactulose.  He has done really well with that.  I am hopeful we will not need this new medication.  Lesion of liver greater than 1 cm in diameter with liver disease conferring risk of hepatocellular carcinoma GI wants a repeat MRI.  He is concerned because even with insurance there is still quite a bit of cost with that.  I told him to discuss with hepatologist.  Alcohol abuse, in remission He is applauded once again for his continued abstinence.  We discussed his coping mechanisms.  Portal hypertensive gastropathy (HCC) Reviewed most recent EGD with him.  I am not clear when he needs to have another 1 and they had told him potentially may.  He said the doctor talk with him after the last 1 and unfortunately he does not remember much of that conversation.  I told him to call his GI doctor and see if he can get some further information.  I do think it is important to keep up with regular monitoring of this and we discussed reasons why that is.  Psychosocial stressors Starting to think about ways to add activities to his life.  He is children are in their teenage years now and not spending as much time with him.  He admits that he is sometimes quite lonely.  This makes maintaining sobriety worse for him.  We discussed options.

## 2022-03-25 NOTE — Assessment & Plan Note (Signed)
Reviewed most recent EGD with him.  I am not clear when he needs to have another 1 and they had told him potentially may.  He said the doctor talk with him after the last 1 and unfortunately he does not remember much of that conversation.  I told him to call his GI doctor and see if he can get some further information.  I do think it is important to keep up with regular monitoring of this and we discussed reasons why that is.

## 2022-03-25 NOTE — Assessment & Plan Note (Signed)
GI wants a repeat MRI.  He is concerned because even with insurance there is still quite a bit of cost with that.  I told him to discuss with hepatologist.

## 2022-03-25 NOTE — Assessment & Plan Note (Signed)
Starting to think about ways to add activities to his life.  He is children are in their teenage years now and not spending as much time with him.  He admits that he is sometimes quite lonely.  This makes maintaining sobriety worse for him.  We discussed options.

## 2022-03-25 NOTE — Assessment & Plan Note (Signed)
Reviewed his medications.  He says he cannot afford the new medication which is Xifaxan.  They had given him a handout about most recent research on that and that it is superior to lactulose and preventing recurrent episode of hepatic encephalopathy.  We discussed at length.  Right now he is only able to continue the lactulose.  He has done really well with that.  I am hopeful we will not need this new medication.

## 2022-04-06 ENCOUNTER — Other Ambulatory Visit (HOSPITAL_COMMUNITY): Payer: Self-pay

## 2022-04-06 ENCOUNTER — Other Ambulatory Visit: Payer: Self-pay | Admitting: Family Medicine

## 2022-04-06 MED ORDER — EPLERENONE 50 MG PO TABS
50.0000 mg | ORAL_TABLET | Freq: Every day | ORAL | 3 refills | Status: DC
  Filled 2022-04-06: qty 90, 90d supply, fill #0
  Filled 2022-07-29: qty 90, 90d supply, fill #1
  Filled 2022-12-05: qty 90, 90d supply, fill #2

## 2022-04-20 ENCOUNTER — Other Ambulatory Visit (HOSPITAL_COMMUNITY): Payer: Self-pay

## 2022-04-20 ENCOUNTER — Other Ambulatory Visit: Payer: Self-pay

## 2022-04-20 ENCOUNTER — Other Ambulatory Visit: Payer: Self-pay | Admitting: Family Medicine

## 2022-04-20 MED ORDER — OXYCODONE HCL 15 MG PO TABS
15.0000 mg | ORAL_TABLET | Freq: Two times a day (BID) | ORAL | 0 refills | Status: DC | PRN
  Filled 2022-04-20: qty 15, 8d supply, fill #0

## 2022-05-01 ENCOUNTER — Ambulatory Visit
Admission: RE | Admit: 2022-05-01 | Discharge: 2022-05-01 | Disposition: A | Discharge status: Home / Self Care | Payer: 59 | Source: Ambulatory Visit | Attending: Nurse Practitioner | Admitting: Nurse Practitioner

## 2022-05-01 DIAGNOSIS — K7460 Unspecified cirrhosis of liver: Secondary | ICD-10-CM | POA: Diagnosis not present

## 2022-05-01 DIAGNOSIS — K7030 Alcoholic cirrhosis of liver without ascites: Secondary | ICD-10-CM

## 2022-05-01 DIAGNOSIS — D376 Neoplasm of uncertain behavior of liver, gallbladder and bile ducts: Secondary | ICD-10-CM

## 2022-05-01 MED ORDER — GADOPICLENOL 0.5 MMOL/ML IV SOLN
8.0000 mL | Freq: Once | INTRAVENOUS | Status: AC | PRN
  Administered 2022-05-01: 8 mL via INTRAVENOUS

## 2022-05-08 ENCOUNTER — Other Ambulatory Visit: Payer: Self-pay | Admitting: Family Medicine

## 2022-05-10 MED ORDER — LACTULOSE 10 GM/15ML PO SOLN: ORAL | 4 refills | Status: DC

## 2022-05-10 NOTE — Addendum Note (Signed)
Addended by: Veronda Prude on: 05/10/2022 08:28 AM   Modules accepted: Orders

## 2022-05-10 NOTE — Telephone Encounter (Signed)
Transmission to pharmacy failed with original rx.   Resent this AM. Receipt confirmed by pharmacy 4/16 at 0827.  Veronda Prude, RN

## 2022-05-11 ENCOUNTER — Other Ambulatory Visit: Payer: Self-pay | Admitting: Family Medicine

## 2022-05-11 ENCOUNTER — Other Ambulatory Visit (HOSPITAL_COMMUNITY): Payer: Self-pay

## 2022-05-11 MED ORDER — LACTULOSE 10 GM/15ML PO SOLN: ORAL | 4 refills | Status: DC

## 2022-05-13 ENCOUNTER — Ambulatory Visit (INDEPENDENT_AMBULATORY_CARE_PROVIDER_SITE_OTHER): Payer: 59 | Admitting: Family Medicine

## 2022-05-13 ENCOUNTER — Encounter: Payer: Self-pay | Admitting: Family Medicine

## 2022-05-13 ENCOUNTER — Other Ambulatory Visit (HOSPITAL_COMMUNITY): Payer: Self-pay

## 2022-05-13 VITALS — BP 130/74

## 2022-05-13 DIAGNOSIS — Z658 Other specified problems related to psychosocial circumstances: Secondary | ICD-10-CM | POA: Diagnosis not present

## 2022-05-13 DIAGNOSIS — F1011 Alcohol abuse, in remission: Secondary | ICD-10-CM

## 2022-05-13 DIAGNOSIS — G4700 Insomnia, unspecified: Secondary | ICD-10-CM | POA: Diagnosis not present

## 2022-05-13 MED ORDER — MIRTAZAPINE 15 MG PO TABS: 15.0000 mg | ORAL_TABLET | Freq: Every day | ORAL | 3 refills | Status: DC

## 2022-05-13 NOTE — Progress Notes (Signed)
  Joshua Hubbard - 47 y.o. male MRN [id number]  Date of birth: 11-21-1975    SUBJECTIVE:      Chief Complaint:/ HPI:  #1.  Insomnia: Increased recent stressors mostly related to finances.  Trying to work things out.  Had to sell his truck.  Having difficulty sleeping and feeling really stressed.  Remains abstinent    OBJECTIVE: BP 130/74   Physical Exam:  Vital signs are reviewed. PSYCH: AxOx4. Good eye contact.. No psychomotor retardation or agitation. Appropriate speech fluency and content. Asks and answers questions appropriately. Mood is congruent.   ASSESSMENT & PLAN: #1.  For insomnia we will retry Remeron.  We had previously used that for other reasons and he tolerated it well.  Follow-up 3 to 4 weeks. 2.  Recommend he contact [facility] and set up a therapist.  I do think this would be beneficial for him.  Face-to-face time 30 minutes See problem based charting & AVS for pt instructions. No problem-specific Assessment & Plan notes found for this encounter.

## 2022-05-29 ENCOUNTER — Other Ambulatory Visit: Payer: Self-pay | Admitting: Family Medicine

## 2022-05-29 MED ORDER — LACTULOSE 10 GM/15ML PO SOLN: ORAL | 4 refills | Status: DC

## 2022-06-02 ENCOUNTER — Other Ambulatory Visit: Payer: Self-pay | Admitting: Family Medicine

## 2022-06-02 MED ORDER — OXYCODONE HCL 15 MG PO TABS: 15.0000 mg | ORAL_TABLET | Freq: Two times a day (BID) | ORAL | 0 refills | Status: DC | PRN

## 2022-06-11 ENCOUNTER — Other Ambulatory Visit: Payer: Self-pay | Admitting: Family Medicine

## 2022-06-24 ENCOUNTER — Encounter: Payer: Self-pay | Admitting: Family Medicine

## 2022-06-24 ENCOUNTER — Other Ambulatory Visit (HOSPITAL_COMMUNITY): Payer: Self-pay

## 2022-06-24 ENCOUNTER — Other Ambulatory Visit: Payer: Self-pay

## 2022-06-24 ENCOUNTER — Ambulatory Visit (INDEPENDENT_AMBULATORY_CARE_PROVIDER_SITE_OTHER): Payer: 59 | Admitting: Family Medicine

## 2022-06-24 VITALS — BP 132/70 | Ht 69.0 in

## 2022-06-24 DIAGNOSIS — F1011 Alcohol abuse, in remission: Secondary | ICD-10-CM | POA: Diagnosis not present

## 2022-06-24 DIAGNOSIS — M25512 Pain in left shoulder: Secondary | ICD-10-CM

## 2022-06-24 DIAGNOSIS — S46011D Strain of muscle(s) and tendon(s) of the rotator cuff of right shoulder, subsequent encounter: Secondary | ICD-10-CM | POA: Diagnosis not present

## 2022-06-24 MED ORDER — OXYCODONE HCL 15 MG PO TABS
15.0000 mg | ORAL_TABLET | Freq: Two times a day (BID) | ORAL | 0 refills | Status: DC | PRN
  Filled 2022-06-24: qty 15, 8d supply, fill #0

## 2022-06-24 MED ORDER — ACAMPROSATE CALCIUM 333 MG PO TBEC
666.0000 mg | DELAYED_RELEASE_TABLET | Freq: Three times a day (TID) | ORAL | 2 refills | Status: DC
  Filled 2022-06-24: qty 180, 30d supply, fill #0
  Filled 2022-10-05: qty 180, 30d supply, fill #1

## 2022-06-24 NOTE — Assessment & Plan Note (Signed)
Continues in remission although he is having some cravings.  Will restart acamprosate.  Lengthy discussion about support systems, AA etc.  Regarding his lactulose use, we explored possibly using 1/3 to 1/2 tablet of over-the-counter loperamide at night to see if it will allow him to sleep through the night not have to get up 4 times for bowel movement.  He needs to continue the lactulose but is kind of caught between a rock and a hard place here.  We discussed that we would have to try very low-dose loperamide for this as it could cause some other issue such as constipation and in the setting of chronic lactulose use that could be quite distressing.  He will follow-up with me in the next 2 to 3 months regarding this.

## 2022-06-24 NOTE — Assessment & Plan Note (Signed)
Chronic pain in the left shoulder as well.  Likely rotator cuff tear per ultrasound.  At this point, he has not received much benefit certainly not long benefit from corticosteroid injections so I do not think that would be useful today.

## 2022-06-24 NOTE — Assessment & Plan Note (Signed)
Continued bilateral shoulder pain.  Giving him very small number of hydrocodone to have on hand and I refilled that today.  We will try to keep him at 15 tablets monthly moving forward.  He is in agreement with this.

## 2022-06-24 NOTE — Progress Notes (Signed)
  Joshua Hubbard - 47 y.o. male MRN [id number]  Date of birth: 09-Apr-1975    SUBJECTIVE:      Chief Complaint:/ HPI:    #1.  Alcohol abuse currently in remission.  Has started having some cravings recently and wants to start back on the acamprosate.  Has remained abstinent. 2.  Continues to have a lot of bilateral shoulder pain which it keeps him up at night.  He does notice that on the few occasions when he takes hydrocodone for his shoulder pain that he sleeps better partly because the pain is gone but also partly because he does not have to get up as often for bowel movement.  He takes his lactulose at night and says he typically gets up 3-4 times a night to have bowel movements.   OBJECTIVE: BP 132/70   Ht 5\' 9"  (1.753 m)   BMI 24.37 kg/m   Physical Exam:  Vital signs are reviewed. GENERAL: Well-developed male no acute distress PSYCH: AxOx4. Good eye contact.. No psychomotor retardation or agitation. Appropriate speech fluency and content. Asks and answers questions appropriately. Mood is congruent.   ASSESSMENT & PLAN:  See problem based charting & AVS for pt instructions. Alcohol abuse, in remission Continues in remission although he is having some cravings.  Will restart acamprosate.  Lengthy discussion about support systems, AA etc.  Regarding his lactulose use, we explored possibly using 1/3 to 1/2 tablet of over-the-counter loperamide at night to see if it will allow him to sleep through the night not have to get up 4 times for bowel movement.  He needs to continue the lactulose but is kind of caught between a rock and a hard place here.  We discussed that we would have to try very low-dose loperamide for this as it could cause some other issue such as constipation and in the setting of chronic lactulose use that could be quite distressing.  He will follow-up with me in the next 2 to 3 months regarding this.  Right rotator cuff tear Continued bilateral shoulder pain.  Giving him  very small number of hydrocodone to have on hand and I refilled that today.  We will try to keep him at 15 tablets monthly moving forward.  He is in agreement with this.  Pain in joint of left shoulder Chronic pain in the left shoulder as well.  Likely rotator cuff tear per ultrasound.  At this point, he has not received much benefit certainly not long benefit from corticosteroid injections so I do not think that would be useful today.

## 2022-06-24 NOTE — Patient Instructions (Signed)
Loperamide = lomotil at VERY los dose at night and maybe not every night

## 2022-07-25 ENCOUNTER — Other Ambulatory Visit: Payer: Self-pay | Admitting: Family Medicine

## 2022-07-25 MED ORDER — OXYCODONE HCL 15 MG PO TABS: 15.0000 mg | ORAL_TABLET | Freq: Two times a day (BID) | ORAL | 0 refills | Status: DC | PRN

## 2022-08-03 ENCOUNTER — Other Ambulatory Visit (HOSPITAL_COMMUNITY): Payer: Self-pay

## 2022-09-06 ENCOUNTER — Other Ambulatory Visit: Payer: Self-pay | Admitting: Family Medicine

## 2022-09-06 MED ORDER — OXYCODONE HCL 15 MG PO TABS: 15.0000 mg | ORAL_TABLET | Freq: Two times a day (BID) | ORAL | 0 refills | Status: DC | PRN

## 2022-09-07 ENCOUNTER — Other Ambulatory Visit: Payer: Self-pay | Admitting: Family Medicine

## 2022-09-07 ENCOUNTER — Other Ambulatory Visit (HOSPITAL_COMMUNITY): Payer: Self-pay

## 2022-09-07 ENCOUNTER — Other Ambulatory Visit: Payer: Self-pay | Admitting: *Deleted

## 2022-09-07 MED ORDER — FUROSEMIDE 20 MG PO TABS: 20.0000 mg | ORAL_TABLET | Freq: Every day | ORAL | Status: DC

## 2022-09-08 ENCOUNTER — Other Ambulatory Visit: Payer: Self-pay | Admitting: Nurse Practitioner

## 2022-09-08 ENCOUNTER — Other Ambulatory Visit (HOSPITAL_COMMUNITY): Payer: Self-pay

## 2022-09-08 DIAGNOSIS — D376 Neoplasm of uncertain behavior of liver, gallbladder and bile ducts: Secondary | ICD-10-CM

## 2022-09-08 DIAGNOSIS — K7682 Hepatic encephalopathy: Secondary | ICD-10-CM

## 2022-09-08 DIAGNOSIS — K3189 Other diseases of stomach and duodenum: Secondary | ICD-10-CM

## 2022-09-08 DIAGNOSIS — I8511 Secondary esophageal varices with bleeding: Secondary | ICD-10-CM | POA: Diagnosis not present

## 2022-09-08 DIAGNOSIS — R188 Other ascites: Secondary | ICD-10-CM

## 2022-09-08 DIAGNOSIS — K766 Portal hypertension: Secondary | ICD-10-CM | POA: Diagnosis not present

## 2022-09-08 DIAGNOSIS — K7030 Alcoholic cirrhosis of liver without ascites: Secondary | ICD-10-CM

## 2022-09-08 MED ORDER — FUROSEMIDE 20 MG PO TABS
20.0000 mg | ORAL_TABLET | Freq: Every day | ORAL | 3 refills | Status: DC
  Filled 2022-09-08: qty 90, 90d supply, fill #0

## 2022-10-05 ENCOUNTER — Other Ambulatory Visit: Payer: Self-pay

## 2022-10-05 ENCOUNTER — Other Ambulatory Visit: Payer: Self-pay | Admitting: Family Medicine

## 2022-10-06 ENCOUNTER — Other Ambulatory Visit (HOSPITAL_COMMUNITY): Payer: Self-pay

## 2022-10-06 MED ORDER — OXYCODONE HCL 15 MG PO TABS
15.0000 mg | ORAL_TABLET | Freq: Two times a day (BID) | ORAL | 0 refills | Status: DC | PRN
  Filled 2022-10-06: qty 15, 8d supply, fill #0

## 2022-10-06 MED ORDER — PANTOPRAZOLE SODIUM 40 MG PO TBEC
40.0000 mg | DELAYED_RELEASE_TABLET | Freq: Two times a day (BID) | ORAL | 3 refills | Status: DC
  Filled 2022-10-06: qty 180, 90d supply, fill #0

## 2022-10-11 ENCOUNTER — Ambulatory Visit
Admission: RE | Admit: 2022-10-11 | Discharge: 2022-10-11 | Disposition: A | Discharge status: Home / Self Care | Payer: 59 | Source: Ambulatory Visit | Attending: Nurse Practitioner | Admitting: Nurse Practitioner

## 2022-10-11 DIAGNOSIS — K7682 Hepatic encephalopathy: Secondary | ICD-10-CM

## 2022-10-11 DIAGNOSIS — R188 Other ascites: Secondary | ICD-10-CM | POA: Diagnosis not present

## 2022-10-11 DIAGNOSIS — K766 Portal hypertension: Secondary | ICD-10-CM | POA: Diagnosis not present

## 2022-10-11 DIAGNOSIS — K3189 Other diseases of stomach and duodenum: Secondary | ICD-10-CM

## 2022-10-11 DIAGNOSIS — D376 Neoplasm of uncertain behavior of liver, gallbladder and bile ducts: Secondary | ICD-10-CM

## 2022-10-11 DIAGNOSIS — K7030 Alcoholic cirrhosis of liver without ascites: Secondary | ICD-10-CM | POA: Diagnosis not present

## 2022-10-11 MED ORDER — IOPAMIDOL (ISOVUE-370) INJECTION 76%
500.0000 mL | Freq: Once | INTRAVENOUS | Status: AC | PRN
  Administered 2022-10-11: 80 mL via INTRAVENOUS

## 2022-10-12 ENCOUNTER — Ambulatory Visit: Payer: 59 | Admitting: Family Medicine

## 2022-10-12 ENCOUNTER — Encounter: Payer: Self-pay | Admitting: Family Medicine

## 2022-10-12 VITALS — BP 128/72 | HR 62 | Ht 69.0 in | Wt 182.8 lb

## 2022-10-12 DIAGNOSIS — K7030 Alcoholic cirrhosis of liver without ascites: Secondary | ICD-10-CM

## 2022-10-12 DIAGNOSIS — Z658 Other specified problems related to psychosocial circumstances: Secondary | ICD-10-CM | POA: Diagnosis not present

## 2022-10-12 DIAGNOSIS — R635 Abnormal weight gain: Secondary | ICD-10-CM

## 2022-10-12 DIAGNOSIS — F1011 Alcohol abuse, in remission: Secondary | ICD-10-CM

## 2022-10-12 NOTE — Progress Notes (Signed)
    CHIEF COMPLAINT / HPI: #1.  Liver cirrhosis: Recently was seen at the transplant clinic.  They have scheduled him for CT scan.  He is feeling a little bit more bloated and has noticed his weight has gone up.  He is surprised at this since his appetite is down. 2.  Also having some increased recurrence of his upper abdominal pain.  He has had this for a long time and it got better for a while but now it is occurring a little bit more frequently.  He discussed this also with transplant doctor. 3.  Alcohol abstention: Last time we talked he was having some cravings.  The acamprosate has helped that some, but he still has them occasionally.  So far he has been able to resist.  From a social standpoint, things are doing a little bit better for him and he has gone out on a date which went fine.   PERTINENT  PMH / PSH: I have reviewed the patient's medications, allergies, past medical and surgical history, smoking status and updated in the EMR as appropriate.   OBJECTIVE:  BP 128/72   Pulse 62   Ht 5\' 9"  (1.753 m)   Wt 182 lb 12.8 oz (82.9 kg)   SpO2 97%   BMI 26.99 kg/m  GENERAL: Well-developed, no acute distress ABDOMEN: No ascites noted.  Nontender.  Hepatomegaly unchanged. CV: Regular rate and rhythm RESPIRATORY: Normal respiratory effort PSYCH: AxOx4. Good eye contact.. No psychomotor retardation or agitation. Appropriate speech fluency and content. Asks and answers questions appropriately. Mood is congruent.   ASSESSMENT / PLAN: #1.  Weight gain: Unclear if this is related to his slightly decreased activity recently or if he has some ascites has not apparent.  Await imaging results.  Discussed getting more exercise.  Discussed healthy eating.   Labs today Liver cirrhosis [facility]) He has had a repeat CT scan but has not been read yet.  He did want to look at the images today which we did.  If there is a lot of ascites noted on the scan, we discussed possibly getting a paracentesis.   He has not had 1 since March 2022.  Alcohol abuse, in remission Continue on acamprosate.  Psychosocial stressors He is generally making good progress.  Again urged him to continue with his support system, good diet and exercise.   Denny Levy MD

## 2022-10-12 NOTE — Assessment & Plan Note (Signed)
He is generally making good progress.  Again urged him to continue with his support system, good diet and exercise.

## 2022-10-12 NOTE — Assessment & Plan Note (Signed)
Continue on acamprosate.

## 2022-10-12 NOTE — Assessment & Plan Note (Signed)
He has had a repeat CT scan but has not been read yet.  He did want to look at the images today which we did.  If there is a lot of ascites noted on the scan, we discussed possibly getting a paracentesis.  He has not had 1 since March 2022.

## 2022-10-13 LAB — CBC

## 2022-10-14 ENCOUNTER — Encounter: Payer: Self-pay | Admitting: Family Medicine

## 2022-10-14 LAB — CBC
Hematocrit: 42.4 % (ref 37.5–51.0)
Hemoglobin: 13.9 g/dL (ref 13.0–17.7)
MCH: 31.4 pg (ref 26.6–33.0)
MCHC: 32.8 g/dL (ref 31.5–35.7)
MCV: 96 fL (ref 79–97)
Platelets: 61 10*3/uL — CL (ref 150–450)
RBC: 4.43 x10E6/uL (ref 4.14–5.80)
RDW: 16.4 % — ABNORMAL HIGH (ref 11.6–15.4)
WBC: 2.8 10*3/uL — ABNORMAL LOW (ref 3.4–10.8)

## 2022-10-14 LAB — COMPREHENSIVE METABOLIC PANEL
ALT: 28 IU/L (ref 0–44)
AST: 89 IU/L — ABNORMAL HIGH (ref 0–40)
Albumin: 4.1 g/dL (ref 4.1–5.1)
Alkaline Phosphatase: 164 IU/L — ABNORMAL HIGH (ref 44–121)
BUN/Creatinine Ratio: 7 — ABNORMAL LOW (ref 9–20)
BUN: 5 mg/dL — ABNORMAL LOW (ref 6–24)
Bilirubin Total: 3.9 mg/dL — ABNORMAL HIGH (ref 0.0–1.2)
CO2: 23 mmol/L (ref 20–29)
Calcium: 9.5 mg/dL (ref 8.7–10.2)
Chloride: 96 mmol/L (ref 96–106)
Creatinine, Ser: 0.73 mg/dL — ABNORMAL LOW (ref 0.76–1.27)
Globulin, Total: 3.2 g/dL (ref 1.5–4.5)
Glucose: 86 mg/dL (ref 70–99)
Potassium: 4.6 mmol/L (ref 3.5–5.2)
Sodium: 135 mmol/L (ref 134–144)
Total Protein: 7.3 g/dL (ref 6.0–8.5)
eGFR: 112 mL/min/{1.73_m2} (ref >59)

## 2022-10-14 LAB — LIPID PANEL
Chol/HDL Ratio: 4.2 ratio (ref 0.0–5.0)
Cholesterol, Total: 123 mg/dL (ref 100–199)
HDL: 29 mg/dL — ABNORMAL LOW (ref >39)
LDL Chol Calc (NIH): 79 mg/dL (ref 0–99)
Triglycerides: 76 mg/dL (ref 0–149)
VLDL Cholesterol Cal: 15 mg/dL (ref 5–40)

## 2022-10-14 LAB — PROTIME-INR
INR: 1.3 — ABNORMAL HIGH (ref 0.9–1.2)
Prothrombin Time: 14.5 s — ABNORMAL HIGH (ref 9.1–12.0)

## 2022-10-14 LAB — TSH: TSH: 3.77 u[IU]/mL (ref 0.450–4.500)

## 2022-10-24 ENCOUNTER — Other Ambulatory Visit: Payer: Self-pay | Admitting: Family Medicine

## 2022-10-26 ENCOUNTER — Other Ambulatory Visit (HOSPITAL_COMMUNITY): Payer: Self-pay

## 2022-10-26 MED ORDER — OXYCODONE HCL 15 MG PO TABS
15.0000 mg | ORAL_TABLET | Freq: Two times a day (BID) | ORAL | 0 refills | Status: DC | PRN
  Filled 2022-11-01: qty 15, 8d supply, fill #0
  Filled ????-??-??: fill #0

## 2022-11-01 ENCOUNTER — Other Ambulatory Visit: Payer: Self-pay

## 2022-11-01 ENCOUNTER — Other Ambulatory Visit (HOSPITAL_COMMUNITY): Payer: Self-pay

## 2022-11-01 ENCOUNTER — Encounter (HOSPITAL_COMMUNITY): Payer: Self-pay

## 2022-11-03 ENCOUNTER — Other Ambulatory Visit: Payer: Self-pay | Admitting: Family Medicine

## 2022-11-03 ENCOUNTER — Other Ambulatory Visit (HOSPITAL_COMMUNITY): Payer: Self-pay

## 2022-11-03 MED ORDER — ONDANSETRON HCL 4 MG PO TABS
4.0000 mg | ORAL_TABLET | Freq: Three times a day (TID) | ORAL | 1 refills | Status: AC | PRN
  Filled 2022-11-03: qty 20, 7d supply, fill #0

## 2022-11-18 ENCOUNTER — Other Ambulatory Visit (HOSPITAL_COMMUNITY): Payer: Self-pay

## 2022-11-18 ENCOUNTER — Ambulatory Visit (INDEPENDENT_AMBULATORY_CARE_PROVIDER_SITE_OTHER): Payer: 59 | Admitting: Family Medicine

## 2022-11-18 ENCOUNTER — Encounter: Payer: Self-pay | Admitting: Family Medicine

## 2022-11-18 ENCOUNTER — Other Ambulatory Visit: Payer: Self-pay

## 2022-11-18 VITALS — BP 137/70 | Ht 69.0 in | Wt 170.0 lb

## 2022-11-18 DIAGNOSIS — F1011 Alcohol abuse, in remission: Secondary | ICD-10-CM | POA: Diagnosis not present

## 2022-11-18 DIAGNOSIS — K766 Portal hypertension: Secondary | ICD-10-CM

## 2022-11-18 DIAGNOSIS — Z9189 Other specified personal risk factors, not elsewhere classified: Secondary | ICD-10-CM | POA: Diagnosis not present

## 2022-11-18 DIAGNOSIS — D696 Thrombocytopenia, unspecified: Secondary | ICD-10-CM

## 2022-11-18 DIAGNOSIS — K769 Liver disease, unspecified: Secondary | ICD-10-CM

## 2022-11-18 DIAGNOSIS — K7031 Alcoholic cirrhosis of liver with ascites: Secondary | ICD-10-CM

## 2022-11-18 DIAGNOSIS — K3189 Other diseases of stomach and duodenum: Secondary | ICD-10-CM | POA: Diagnosis not present

## 2022-11-18 MED ORDER — OXYCODONE HCL 15 MG PO TABS: 15.0000 mg | ORAL_TABLET | Freq: Two times a day (BID) | ORAL | 0 refills | Status: DC | PRN

## 2022-11-18 MED ORDER — OXYCODONE HCL 15 MG PO TABS
15.0000 mg | ORAL_TABLET | Freq: Two times a day (BID) | ORAL | 0 refills | Status: DC | PRN
  Filled 2022-11-18: qty 15, 8d supply, fill #0

## 2022-11-18 MED ORDER — PANTOPRAZOLE SODIUM 40 MG PO TBEC
40.0000 mg | DELAYED_RELEASE_TABLET | Freq: Two times a day (BID) | ORAL | 3 refills | Status: DC
  Filled 2022-11-18 – 2023-01-06 (×2): qty 180, 90d supply, fill #0
  Filled 2023-05-05: qty 180, 90d supply, fill #1

## 2022-11-18 MED ORDER — ACAMPROSATE CALCIUM 333 MG PO TBEC
666.0000 mg | DELAYED_RELEASE_TABLET | Freq: Three times a day (TID) | ORAL | 2 refills | Status: DC
  Filled 2022-11-18: qty 120, 20d supply, fill #0
  Filled 2022-11-18: qty 60, 10d supply, fill #0
  Filled 2022-11-18: qty 180, 30d supply, fill #0
  Filled 2022-12-05: qty 15, 3d supply, fill #1
  Filled 2022-12-12: qty 165, 27d supply, fill #1
  Filled 2022-12-14: qty 63, 11d supply, fill #2
  Filled 2022-12-26: qty 63, 11d supply, fill #3
  Filled 2023-01-03: qty 180, 30d supply, fill #3

## 2022-11-20 NOTE — Assessment & Plan Note (Signed)
Reviewed meds and refilled some. Concerning as he does look distended today. Will order US paracentesis. Depending on shatthey see on [place], will consider further imaging (MRI abdomen which is probably actually due if he follows Q 3m). He has tried to extend periods betweedn imaging due to cost. I explained this may not be advisable. He has seen hepatology and they are evaluating for transplant status.

## 2022-11-20 NOTE — Assessment & Plan Note (Signed)
" >>  ASSESSMENT AND PLAN FOR THROMBOCYTOPENIA WRITTEN ON 11/20/2022  2:26 PM BY Assia Meanor L, MD  Will plan to check labs soon. Await US  findings as we may need to add some. "

## 2022-11-20 NOTE — Assessment & Plan Note (Signed)
Continue current meds. Refills.

## 2022-11-20 NOTE — Assessment & Plan Note (Signed)
Will plan to check labs soon. Await US findings as we may need to add some.

## 2022-11-20 NOTE — Assessment & Plan Note (Signed)
He maintains abstinence. Continue acamprosate.

## 2022-11-20 NOTE — Assessment & Plan Note (Signed)
Suspect we will need repeat MRI soon. We discussed.

## 2022-11-20 NOTE — Progress Notes (Signed)
Joshua Hubbard - 47 y.o. male MRN [id number]  Date of birth: Aug 20, 1975    SUBJECTIVE:      Chief Complaint:/ HPI:  Having more recurrent abdominal distention and pain.  He is afraid he needs to have another paracentesis done.  Also wants to review his recent imaging done by the hepatologist.  Pathologist.  Also needs some refills.  Continues to abstinence.  The acamprosate we added back has been beneficial in this regard.  No significant episodes of bleeding.  He did have a GI virus of some kind and had some vomiting and had a little bit of blood-tinged sputum a day or so after the day or so after that but that resolved.    OBJECTIVE: BP 137/70   Ht 5\' 9"  (1.753 m)   Wt 170 lb (77.1 kg)   BMI 25.10 kg/m   Physical Exam:  Vital signs are reviewed. GENERAL: Well-developed no acute distress ABDOMEN: Distended.  Diffusely slightly tender to palpation but not in any specific location.  No rebound.  Does have some generalized guarding to deep compression.  Positive bowel sounds.  Liver edge is easily palpable and the spleen edge is well below the left costal margin and palpable  PERTINENT  PMH / PSH: I have reviewed the patient's medications, allergies, past medical and surgical history, smoking status.  Pertinent findings that relate to today's visit / issues include:  Recent CT scan results 10/11/2022:pertinent findings from report: Hepatobiliary: Nodular hepatic contours are noted consistent with  hepatic cirrhosis. Minimal cholelithiasis. No biliary dilatation is noted. Heterogeneous low density is noted throughout hepaticparenchyma which may represent hepatic steatosis, but other pathology cannot be excluded. 5.7 cm ill-defined focal area is noted in right hepatic lobe. Possibility of underlying hepatocellular carcinoma cannot be excluded given the presence of hepatic cirrhosis.   Spleen: Severe splenomegaly is noted.  Stomach/Bowel: Extensive amount of paraesophageal varices are  noted. Stomach is unremarkable. No abnormality is seen involving the visualized loops of large and small bowel.  Vascular/Lymphatic: Mildly enlarged lymph nodes are noted in porta hepatis region most likely related to hepatic cirrhosis. Multiple enlarged collateral veins are seen anteriorly in the abdomen consistent with collateral circulation secondary to portal hypertension.  Minimal ascites is seen adjacent to the liver.  I reviewed images and reports with [name] in office. We also compared to prior MRI report which mentioned mild splenomegaly.   CT scan 12/14/2019: '... Spleen: Splenomegaly, maximum coronal span 15.9 cm.   ASSESSMENT & PLAN:  See problem based charting & AVS for pt instructions. Liver cirrhosis (HCC) Reviewed meds and refilled some. Concerning as he does look distended today. Will order US paracentesis. Depending on shatthey see on [place], will consider further imaging (MRI abdomen which is probably actually due if he follows Q 52m). He has tried to extend periods betweedn imaging due to cost. I explained this may not be advisable. He has seen hepatology and they are evaluating for transplant status.  Alcohol abuse, in remission He maintains abstinence. Continue acamprosate.  Thrombocytopenia (HCC) Will plan to check labs soon. Await US findings as we may need to add some.  Portal hypertensive gastropathy (HCC) Continue current meds. Refills.  Lesion of liver greater than 1 cm in diameter with liver disease conferring risk of hepatocellular carcinoma Suspect we will need repeat MRI soon. We discussed. He will f/u with me once he gets paracentesis [place] scheduled.

## 2022-11-21 ENCOUNTER — Other Ambulatory Visit (HOSPITAL_COMMUNITY): Payer: Self-pay

## 2022-11-21 MED ORDER — INFLUENZA VIRUS VACC SPLIT PF (FLUZONE) 0.5 ML IM SUSY
0.5000 mL | PREFILLED_SYRINGE | Freq: Once | INTRAMUSCULAR | 0 refills | Status: AC
  Filled 2022-11-21: qty 0.5, 1d supply, fill #0

## 2022-11-30 ENCOUNTER — Other Ambulatory Visit (HOSPITAL_COMMUNITY): Payer: Self-pay

## 2022-12-05 ENCOUNTER — Other Ambulatory Visit (HOSPITAL_COMMUNITY): Payer: Self-pay

## 2022-12-07 ENCOUNTER — Other Ambulatory Visit (HOSPITAL_COMMUNITY): Payer: Self-pay

## 2022-12-12 ENCOUNTER — Other Ambulatory Visit (HOSPITAL_COMMUNITY): Payer: Self-pay

## 2022-12-12 ENCOUNTER — Other Ambulatory Visit: Payer: Self-pay

## 2022-12-13 ENCOUNTER — Other Ambulatory Visit (HOSPITAL_COMMUNITY): Payer: Self-pay

## 2022-12-14 ENCOUNTER — Other Ambulatory Visit (HOSPITAL_COMMUNITY): Payer: Self-pay

## 2022-12-26 ENCOUNTER — Other Ambulatory Visit (HOSPITAL_COMMUNITY): Payer: Self-pay

## 2022-12-26 ENCOUNTER — Other Ambulatory Visit: Payer: Self-pay | Admitting: Family Medicine

## 2022-12-27 ENCOUNTER — Encounter: Payer: Self-pay | Admitting: Family Medicine

## 2022-12-27 ENCOUNTER — Other Ambulatory Visit (HOSPITAL_COMMUNITY): Payer: Self-pay

## 2022-12-27 ENCOUNTER — Encounter (HOSPITAL_COMMUNITY): Payer: Self-pay

## 2022-12-29 ENCOUNTER — Other Ambulatory Visit (HOSPITAL_COMMUNITY): Payer: Self-pay

## 2023-01-02 ENCOUNTER — Other Ambulatory Visit (HOSPITAL_COMMUNITY): Payer: Self-pay

## 2023-01-03 ENCOUNTER — Other Ambulatory Visit (HOSPITAL_COMMUNITY): Payer: Self-pay

## 2023-01-06 ENCOUNTER — Other Ambulatory Visit (HOSPITAL_COMMUNITY): Payer: Self-pay

## 2023-01-06 ENCOUNTER — Other Ambulatory Visit: Payer: Self-pay

## 2023-01-11 ENCOUNTER — Encounter: Payer: Self-pay | Admitting: Family Medicine

## 2023-01-11 ENCOUNTER — Ambulatory Visit: Payer: 59 | Admitting: Family Medicine

## 2023-01-11 VITALS — BP 118/68 | HR 81 | Ht 69.0 in | Wt 183.0 lb

## 2023-01-11 DIAGNOSIS — K7030 Alcoholic cirrhosis of liver without ascites: Secondary | ICD-10-CM

## 2023-01-11 MED ORDER — LIDOCAINE 5 % EX PTCH: 1.0000 | MEDICATED_PATCH | CUTANEOUS | 0 refills | Status: DC

## 2023-01-11 MED ORDER — FUROSEMIDE 20 MG PO TABS: 20.0000 mg | ORAL_TABLET | Freq: Every day | ORAL | 3 refills | Status: DC

## 2023-01-11 MED ORDER — PROPRANOLOL HCL 10 MG PO TABS: 10.0000 mg | ORAL_TABLET | Freq: Two times a day (BID) | ORAL | 3 refills | Status: DC

## 2023-01-13 ENCOUNTER — Encounter: Payer: Self-pay | Admitting: Family Medicine

## 2023-01-13 NOTE — Progress Notes (Signed)
    CHIEF COMPLAINT / HPI: Follow-up hepatic encephalopathy: Was released from his job.  Lots of stressors.  He has not been doing well at all.  Had to get new kind of insurance which will start in January.  Unfortunately we are not on that insurance panel and he is concerned about that.  Energy has been extremely low.  Some depression but no suicidal or homicidal ideation intent or plan.  Has started going regularly daily basis except for Sundays to AA meetings and has found this extremely helpful.  Very happy he is made that a part of his life.  Feels like that is the most positive thing in his life right now.  Considering next steps for employment.  To make sure he has enough refills of his current medications especially before he switches care to different insurance.   PERTINENT  PMH / PSH: I have reviewed the patient's medications, allergies, past medical and surgical history, smoking status and updated in the EMR as appropriate.   OBJECTIVE:  BP 118/68   Pulse 81   Ht 5\' 9"  (1.753 m)   Wt 183 lb (83 kg)   SpO2 95%   BMI 27.02 kg/m  GENERAL: Well-developed, no acute distress PSYCH: AxOx4. Good eye contact.. No psychomotor retardation or agitation. Appropriate speech fluency and content. Asks and answers questions appropriately. Mood is congruent.   ASSESSMENT / PLAN:   No problem-specific Assessment & Plan notes found for this encounter.   Denny Levy MD

## 2023-01-13 NOTE — Assessment & Plan Note (Signed)
Long discussion.  I commended him for starting AA.  Will refill his medicines today.  Will be happy to participate in his care as a care team with his provider under his new insurance if that is at all possible.  He will let me know.  Total time spent 30 minutes.

## 2023-02-01 ENCOUNTER — Ambulatory Visit: Payer: 59 | Admitting: Family Medicine

## 2023-02-07 ENCOUNTER — Other Ambulatory Visit: Payer: Self-pay

## 2023-02-07 ENCOUNTER — Encounter (HOSPITAL_COMMUNITY): Payer: Self-pay

## 2023-02-07 ENCOUNTER — Emergency Department (HOSPITAL_COMMUNITY)
Admission: EM | Admit: 2023-02-07 | Discharge: 2023-02-08 | Discharge status: Against Medical Advice | Payer: BLUE CROSS/BLUE SHIELD

## 2023-02-07 DIAGNOSIS — K7031 Alcoholic cirrhosis of liver with ascites: Secondary | ICD-10-CM | POA: Diagnosis not present

## 2023-02-07 DIAGNOSIS — Z5321 Procedure and treatment not carried out due to patient leaving prior to being seen by health care provider: Secondary | ICD-10-CM | POA: Insufficient documentation

## 2023-02-07 DIAGNOSIS — R1084 Generalized abdominal pain: Secondary | ICD-10-CM | POA: Insufficient documentation

## 2023-02-07 DIAGNOSIS — R17 Unspecified jaundice: Secondary | ICD-10-CM | POA: Insufficient documentation

## 2023-02-07 DIAGNOSIS — E876 Hypokalemia: Secondary | ICD-10-CM | POA: Diagnosis not present

## 2023-02-07 DIAGNOSIS — R410 Disorientation, unspecified: Secondary | ICD-10-CM | POA: Insufficient documentation

## 2023-02-07 LAB — CBC WITH DIFFERENTIAL/PLATELET
Abs Immature Granulocytes: 0.01 10*3/uL (ref 0.00–0.07)
Basophils Absolute: 0 10*3/uL (ref 0.0–0.1)
Basophils Relative: 1 %
Eosinophils Absolute: 0 10*3/uL (ref 0.0–0.5)
Eosinophils Relative: 2 %
HCT: 35.6 % — ABNORMAL LOW (ref 39.0–52.0)
Hemoglobin: 12.6 g/dL — ABNORMAL LOW (ref 13.0–17.0)
Immature Granulocytes: 1 %
Lymphocytes Relative: 16 %
Lymphs Abs: 0.3 10*3/uL — ABNORMAL LOW (ref 0.7–4.0)
MCH: 34.4 pg — ABNORMAL HIGH (ref 26.0–34.0)
MCHC: 35.4 g/dL (ref 30.0–36.0)
MCV: 97.3 fL (ref 80.0–100.0)
Monocytes Absolute: 0.1 10*3/uL (ref 0.1–1.0)
Monocytes Relative: 6 %
Neutro Abs: 1.2 10*3/uL — ABNORMAL LOW (ref 1.7–7.7)
Neutrophils Relative %: 74 %
Platelets: 74 10*3/uL — ABNORMAL LOW (ref 150–400)
RBC: 3.66 MIL/uL — ABNORMAL LOW (ref 4.22–5.81)
RDW: 17.2 % — ABNORMAL HIGH (ref 11.5–15.5)
WBC: 1.6 10*3/uL — ABNORMAL LOW (ref 4.0–10.5)
nRBC: 0 % (ref 0.0–0.2)

## 2023-02-07 LAB — COMPREHENSIVE METABOLIC PANEL
ALT: 35 U/L (ref 0–44)
AST: 43 U/L — ABNORMAL HIGH (ref 15–41)
Albumin: 2.3 g/dL — ABNORMAL LOW (ref 3.5–5.0)
Alkaline Phosphatase: 72 U/L (ref 38–126)
Anion gap: 8 (ref 5–15)
BUN: 11 mg/dL (ref 6–20)
CO2: 23 mmol/L (ref 22–32)
Calcium: 8 mg/dL — ABNORMAL LOW (ref 8.9–10.3)
Chloride: 103 mmol/L (ref 98–111)
Creatinine, Ser: 0.7 mg/dL (ref 0.61–1.24)
GFR, Estimated: >60 mL/min (ref >60)
Glucose, Bld: 114 mg/dL — ABNORMAL HIGH (ref 70–99)
Potassium: 3.5 mmol/L (ref 3.5–5.1)
Sodium: 134 mmol/L — ABNORMAL LOW (ref 135–145)
Total Bilirubin: 8.5 mg/dL — ABNORMAL HIGH (ref 0.0–1.2)
Total Protein: 6.3 g/dL — ABNORMAL LOW (ref 6.5–8.1)

## 2023-02-07 LAB — AMMONIA: Ammonia: 16 umol/L (ref 9–35)

## 2023-02-07 LAB — ETHANOL: Alcohol, Ethyl (B): <10 mg/dL (ref <10)

## 2023-02-07 LAB — LIPASE, BLOOD: Lipase: 40 U/L (ref 11–51)

## 2023-02-07 NOTE — ED Notes (Signed)
 Verbal consent given for mse

## 2023-02-07 NOTE — ED Triage Notes (Signed)
 Pt c/o dizziness  2-3 days, endorses some fevers, N/V/D; c/o generalized abdominal pain; RUQ tenderness; abdomen distended, pt jaundice; hx cirrhosis

## 2023-02-07 NOTE — ED Notes (Signed)
 Patient left.

## 2023-02-07 NOTE — ED Provider Triage Note (Signed)
 Emergency Medicine Provider Triage Evaluation Note  Joshua Hubbard , a 48 y.o. male  was evaluated in triage.  Pt complains of diffuse abd pain for last several months with worsening over past two days.  Mild confusion in triage  Hx of cirrhosis and hepatic encephalopathy  Review of Systems  Positive: Abd pain, confusion Negative: Fevers, vomiting, ETOH use  Physical Exam  BP (!) 113/91   Pulse 94   Temp 99.1 F (37.3 C) (Oral)   Resp 16   SpO2 97%  Gen:   Awake, no distress   Resp:  Normal effort  MSK:   Moves extremities without difficulty  Other:  Jaundice, diffuse abd TTP  Medical Decision Making  Medically screening exam initiated at 12:10 PM.  Appropriate orders placed.  Adine SHAUNNA Meisenheimer was informed that the remainder of the evaluation will be completed by another provider, this initial triage assessment does not replace that evaluation, and the importance of remaining in the ED until their evaluation is complete.  Labs ordered   Minnie Tinnie BRAVO, [place] 02/07/23 1215

## 2023-02-08 ENCOUNTER — Encounter (HOSPITAL_BASED_OUTPATIENT_CLINIC_OR_DEPARTMENT_OTHER): Payer: Self-pay | Admitting: Emergency Medicine

## 2023-02-08 ENCOUNTER — Other Ambulatory Visit: Payer: Self-pay

## 2023-02-08 ENCOUNTER — Emergency Department (HOSPITAL_BASED_OUTPATIENT_CLINIC_OR_DEPARTMENT_OTHER): Payer: BLUE CROSS/BLUE SHIELD

## 2023-02-08 ENCOUNTER — Inpatient Hospital Stay (HOSPITAL_BASED_OUTPATIENT_CLINIC_OR_DEPARTMENT_OTHER)
Admission: EM | Admit: 2023-02-08 | Discharge: 2023-02-13 | DRG: 432 | Disposition: A | Discharge status: Home / Self Care | Payer: BLUE CROSS/BLUE SHIELD | Attending: Family Medicine | Admitting: Family Medicine

## 2023-02-08 DIAGNOSIS — G8929 Other chronic pain: Secondary | ICD-10-CM | POA: Diagnosis present

## 2023-02-08 DIAGNOSIS — I8510 Secondary esophageal varices without bleeding: Secondary | ICD-10-CM | POA: Diagnosis present

## 2023-02-08 DIAGNOSIS — K649 Unspecified hemorrhoids: Secondary | ICD-10-CM | POA: Diagnosis present

## 2023-02-08 DIAGNOSIS — D539 Nutritional anemia, unspecified: Secondary | ICD-10-CM | POA: Diagnosis present

## 2023-02-08 DIAGNOSIS — K766 Portal hypertension: Secondary | ICD-10-CM | POA: Diagnosis present

## 2023-02-08 DIAGNOSIS — R011 Cardiac murmur, unspecified: Secondary | ICD-10-CM | POA: Insufficient documentation

## 2023-02-08 DIAGNOSIS — I81 Portal vein thrombosis: Secondary | ICD-10-CM | POA: Diagnosis present

## 2023-02-08 DIAGNOSIS — I10 Essential (primary) hypertension: Secondary | ICD-10-CM | POA: Diagnosis present

## 2023-02-08 DIAGNOSIS — K7031 Alcoholic cirrhosis of liver with ascites: Secondary | ICD-10-CM | POA: Diagnosis present

## 2023-02-08 DIAGNOSIS — Z83438 Family history of other disorder of lipoprotein metabolism and other lipidemia: Secondary | ICD-10-CM

## 2023-02-08 DIAGNOSIS — Z888 Allergy status to other drugs, medicaments and biological substances status: Secondary | ICD-10-CM

## 2023-02-08 DIAGNOSIS — K2980 Duodenitis without bleeding: Secondary | ICD-10-CM | POA: Diagnosis present

## 2023-02-08 DIAGNOSIS — K7682 Hepatic encephalopathy: Secondary | ICD-10-CM | POA: Diagnosis present

## 2023-02-08 DIAGNOSIS — K3189 Other diseases of stomach and duodenum: Secondary | ICD-10-CM | POA: Diagnosis present

## 2023-02-08 DIAGNOSIS — E876 Hypokalemia: Secondary | ICD-10-CM | POA: Diagnosis present

## 2023-02-08 DIAGNOSIS — A0472 Enterocolitis due to Clostridium difficile, not specified as recurrent: Secondary | ICD-10-CM | POA: Diagnosis present

## 2023-02-08 DIAGNOSIS — R3911 Hesitancy of micturition: Secondary | ICD-10-CM | POA: Insufficient documentation

## 2023-02-08 DIAGNOSIS — E871 Hypo-osmolality and hyponatremia: Secondary | ICD-10-CM | POA: Diagnosis present

## 2023-02-08 DIAGNOSIS — D689 Coagulation defect, unspecified: Secondary | ICD-10-CM | POA: Diagnosis present

## 2023-02-08 DIAGNOSIS — Z885 Allergy status to narcotic agent status: Secondary | ICD-10-CM | POA: Diagnosis not present

## 2023-02-08 DIAGNOSIS — K922 Gastrointestinal hemorrhage, unspecified: Secondary | ICD-10-CM | POA: Diagnosis present

## 2023-02-08 DIAGNOSIS — F1090 Alcohol use, unspecified, uncomplicated: Secondary | ICD-10-CM | POA: Diagnosis not present

## 2023-02-08 DIAGNOSIS — I8501 Esophageal varices with bleeding: Secondary | ICD-10-CM | POA: Diagnosis not present

## 2023-02-08 DIAGNOSIS — K317 Polyp of stomach and duodenum: Secondary | ICD-10-CM | POA: Diagnosis present

## 2023-02-08 DIAGNOSIS — R188 Other ascites: Secondary | ICD-10-CM

## 2023-02-08 DIAGNOSIS — Z79899 Other long term (current) drug therapy: Secondary | ICD-10-CM | POA: Diagnosis not present

## 2023-02-08 DIAGNOSIS — K7460 Unspecified cirrhosis of liver: Secondary | ICD-10-CM

## 2023-02-08 DIAGNOSIS — K625 Hemorrhage of anus and rectum: Secondary | ICD-10-CM | POA: Diagnosis not present

## 2023-02-08 DIAGNOSIS — Z8249 Family history of ischemic heart disease and other diseases of the circulatory system: Secondary | ICD-10-CM | POA: Diagnosis not present

## 2023-02-08 DIAGNOSIS — D61818 Other pancytopenia: Principal | ICD-10-CM | POA: Insufficient documentation

## 2023-02-08 DIAGNOSIS — D72819 Decreased white blood cell count, unspecified: Secondary | ICD-10-CM | POA: Insufficient documentation

## 2023-02-08 DIAGNOSIS — K2981 Duodenitis with bleeding: Secondary | ICD-10-CM | POA: Diagnosis not present

## 2023-02-08 DIAGNOSIS — E538 Deficiency of other specified B group vitamins: Secondary | ICD-10-CM

## 2023-02-08 LAB — PROTIME-INR
INR: 2.1 — ABNORMAL HIGH (ref 0.8–1.2)
Prothrombin Time: 24.1 s — ABNORMAL HIGH (ref 11.4–15.2)

## 2023-02-08 LAB — COMPREHENSIVE METABOLIC PANEL
ALT: 32 U/L (ref 0–44)
AST: 41 U/L (ref 15–41)
Albumin: 2.3 g/dL — ABNORMAL LOW (ref 3.5–5.0)
Alkaline Phosphatase: 64 U/L (ref 38–126)
Anion gap: 8 (ref 5–15)
BUN: 12 mg/dL (ref 6–20)
CO2: 23 mmol/L (ref 22–32)
Calcium: 7.6 mg/dL — ABNORMAL LOW (ref 8.9–10.3)
Chloride: 99 mmol/L (ref 98–111)
Creatinine, Ser: 0.56 mg/dL — ABNORMAL LOW (ref 0.61–1.24)
GFR, Estimated: >60 mL/min (ref >60)
Glucose, Bld: 115 mg/dL — ABNORMAL HIGH (ref 70–99)
Potassium: 2.7 mmol/L — CL (ref 3.5–5.1)
Sodium: 130 mmol/L — ABNORMAL LOW (ref 135–145)
Total Bilirubin: 8.2 mg/dL — ABNORMAL HIGH (ref 0.0–1.2)
Total Protein: 5.8 g/dL — ABNORMAL LOW (ref 6.5–8.1)

## 2023-02-08 LAB — OCCULT BLOOD X 1 CARD TO LAB, STOOL: Fecal Occult Bld: POSITIVE — AB

## 2023-02-08 LAB — MAGNESIUM: Magnesium: 1.5 mg/dL — ABNORMAL LOW (ref 1.7–2.4)

## 2023-02-08 LAB — AMMONIA: Ammonia: 21 umol/L (ref 9–35)

## 2023-02-08 LAB — ETHANOL: Alcohol, Ethyl (B): <10 mg/dL (ref <10)

## 2023-02-08 MED ORDER — MORPHINE SULFATE (PF) 4 MG/ML IV SOLN
4.0000 mg | Freq: Once | INTRAVENOUS | Status: AC
  Administered 2023-02-08: 4 mg via INTRAVENOUS
  Filled 2023-02-08: qty 1

## 2023-02-08 MED ORDER — SODIUM CHLORIDE 0.9 % IV SOLN: INTRAVENOUS | Status: AC | PRN

## 2023-02-08 MED ORDER — IOHEXOL 350 MG/ML SOLN
100.0000 mL | Freq: Once | INTRAVENOUS | Status: AC | PRN
  Administered 2023-02-08: 100 mL via INTRAVENOUS

## 2023-02-08 MED ORDER — MAGNESIUM SULFATE 2 GM/50ML IV SOLN
2.0000 g | Freq: Once | INTRAVENOUS | Status: AC
  Administered 2023-02-08: 2 g via INTRAVENOUS
  Filled 2023-02-08: qty 50

## 2023-02-08 MED ORDER — ONDANSETRON HCL 4 MG/2ML IJ SOLN
4.0000 mg | Freq: Once | INTRAMUSCULAR | Status: AC
  Administered 2023-02-08: 4 mg via INTRAVENOUS
  Filled 2023-02-08: qty 2

## 2023-02-08 MED ORDER — POTASSIUM CHLORIDE 10 MEQ/100ML IV SOLN
10.0000 meq | INTRAVENOUS | Status: AC
  Administered 2023-02-08 (×3): 10 meq via INTRAVENOUS
  Filled 2023-02-08 (×3): qty 100

## 2023-02-08 MED ORDER — SODIUM CHLORIDE 0.9 % IV BOLUS
500.0000 mL | Freq: Once | INTRAVENOUS | Status: AC
  Administered 2023-02-08: 500 mL via INTRAVENOUS

## 2023-02-08 NOTE — ED Triage Notes (Signed)
 Pt with bloody stools x couple days; c/o intermittent dizziness; abd is tended to to palpation and with movement; pt is jaundiced, hx of cirrhosis

## 2023-02-08 NOTE — ED Notes (Signed)
 Date and time results received: 02/08/23  1515  Test: CBC  Critical Value: WBC 1.2                        Ab neutrophils 0.6  K+ 2.7  Name of Provider Notified: [place]  Orders Received? Or Actions Taken?: Orders placed

## 2023-02-08 NOTE — ED Provider Notes (Signed)
 [facility] Provider Note   CSN: [id number] Arrival date & time: 02/08/23  1405     History  Chief Complaint  Patient presents with   Rectal Bleeding    Joshua Hubbard is a 48 y.o. male.  Pt is a 48 yo male with pmhx significant for htn, alcoholic cirrhosis, and arthritis.  Pt has been having bloody stools for the past few days.  He also has abd pain as well.  He feels more confused.  He has not been eating/drinking much.  He has seen [facility] and a liver specialist with [facility] (last visit was Dec 2023).  Pt did go to [facility] yesterday, but spent many hours in the waiting room.  He said he has not drank since 2021.       Home Medications Prior to Admission medications   Medication Sig Start Date End Date Taking? Authorizing Provider  acamprosate  (CAMPRAL ) 333 MG tablet Take 2 tablets by mouth 3 (three) times daily. 11/18/22   Charise Companion, MD  eplerenone  (INSPRA ) 50 MG tablet Take 1 tablet (50 mg total) by mouth daily. 04/06/22   Charise Companion, MD  furosemide  (LASIX ) 20 MG tablet Take 1 tablet (20 mg total) by mouth at bedtime. 01/11/23   Charise Companion, MD  lactulose  (CHRONULAC ) 10 GM/15ML solution TAKE 30 MLS (20 G TOTAL) BY MOUTH 3 TIMES DAILY. 05/29/22   Charise Companion, MD  lidocaine  (LIDODERM ) 5 % Place 1 patch onto the skin daily. Remove & Discard patch within 12 hours or as directed by MD 01/11/23   Charise Companion, MD  ondansetron  (ZOFRAN ) 4 MG tablet Take 1 tablet (4 mg total) by mouth every 8 (eight) hours as needed for nausea or vomiting. 11/03/22   Charise Companion, MD  oxyCODONE  (ROXICODONE ) 15 MG immediate release tablet Take 1 tablet (15 mg total) by mouth 2 (two) times daily as needed for musculoskeletal pain 11/18/22   Charise Companion, MD  oxyCODONE  (ROXICODONE ) 15 MG immediate release tablet Take 1 tablet (15 mg total) by mouth 2 (two) times daily as needed for musculoskeletal pain 11/18/22     pantoprazole  (PROTONIX ) 40 MG tablet Take 1 tablet  (40 mg total) by mouth 2 (two) times daily. 11/18/22   Charise Companion, MD  propranolol  (INDERAL ) 10 MG tablet Take 1 tablet (10 mg total) by mouth 2 (two) times daily. 01/11/23   Charise Companion, MD  Varenicline Tartrate (TYRVAYA) 0.03 MG/ACT SOLN Place 1 spray into the nose daily as needed (dry eyes).    [provider]      Allergies    Spironolactone  and Tramadol     Review of Systems   Review of Systems  Gastrointestinal:  Positive for abdominal pain and blood in stool.  Neurological:  Positive for weakness.  All other systems reviewed and are negative.   Physical Exam Updated Vital Signs BP 100/62   Pulse 84   Temp 98.8 F (37.1 C) (Oral)   Resp 11   Ht 5\' 9"  (1.753 m)   Wt 81.6 kg   SpO2 96%   BMI 26.58 kg/m  Physical Exam Vitals and nursing note reviewed.  Constitutional:      Appearance: Normal appearance. He is ill-appearing.  HENT:     Head: Normocephalic and atraumatic.     Right Ear: External ear normal.     Left Ear: External ear normal.     Nose: Nose normal.  Mouth/Throat:     Mouth: Mucous membranes are dry.  Eyes:     General: Scleral icterus present.     Extraocular Movements: Extraocular movements intact.     Conjunctiva/sclera: Conjunctivae normal.     Pupils: Pupils are equal, round, and reactive to light.  Cardiovascular:     Rate and Rhythm: Normal rate and regular rhythm.     Pulses: Normal pulses.     Heart sounds: Normal heart sounds.  Pulmonary:     Effort: Pulmonary effort is normal.     Breath sounds: Normal breath sounds.  Abdominal:     General: Bowel sounds are normal. There is distension.     Tenderness: There is generalized abdominal tenderness.     Comments: +ascites  Genitourinary:    Rectum: Guaiac result positive.  Musculoskeletal:        General: Normal range of motion.     Cervical back: Normal range of motion and neck supple.  Skin:    Capillary Refill: Capillary refill takes less than 2 seconds.      Coloration: Skin is jaundiced.  Neurological:     General: No focal deficit present.     Mental Status: He is alert and oriented to person, place, and time.  Psychiatric:        Mood and Affect: Mood normal.        Behavior: Behavior normal.     ED Results / Procedures / Treatments   Labs (all labs ordered are listed, but only abnormal results are displayed) Labs Reviewed  COMPREHENSIVE METABOLIC PANEL - Abnormal; Notable for the following components:      Result Value   Sodium 130 (*)    Potassium 2.7 (*)    Glucose, Bld 115 (*)    Creatinine, Ser 0.56 (*)    Calcium  7.6 (*)    Total Protein 5.8 (*)    Albumin  2.3 (*)    Total Bilirubin 8.2 (*)    All other components within normal limits  CBC WITH DIFFERENTIAL/PLATELET - Abnormal; Notable for the following components:   WBC 1.2 (*)    RBC 3.42 (*)    Hemoglobin 11.7 (*)    HCT 32.7 (*)    MCH 34.2 (*)    RDW 16.6 (*)    Platelets 67 (*)    Neutro Abs 0.6 (*)    Lymphs Abs 0.3 (*)    All other components within normal limits  MAGNESIUM  - Abnormal; Notable for the following components:   Magnesium  1.5 (*)    All other components within normal limits  PROTIME-INR - Abnormal; Notable for the following components:   Prothrombin Time 24.1 (*)    INR 2.1 (*)    All other components within normal limits  OCCULT BLOOD X 1 CARD TO LAB, STOOL - Abnormal; Notable for the following components:   Fecal Occult Bld POSITIVE (*)    All other components within normal limits  ETHANOL  AMMONIA  URINALYSIS, ROUTINE W REFLEX MICROSCOPIC    EKG EKG Interpretation Date/Time:  Wednesday February 08 2023 15:38:50 EST Ventricular Rate:  83 PR Interval:  168 QRS Duration:  110 QT Interval:  409 QTC Calculation: 481 R Axis:   -28  Text Interpretation: Sinus rhythm Borderline left axis deviation Borderline prolonged QT interval No significant change since last tracing Confirmed by Sueellen Emery [id number]) on 02/08/2023 5:03:21  PM  Radiology CT Angio Abd/Pel W and/or Wo Contrast Result Date: 02/08/2023 CLINICAL DATA:  Lower GI bleed.  History  of cirrhosis. EXAM: CTA ABDOMEN AND PELVIS WITHOUT AND WITH CONTRAST TECHNIQUE: Multidetector CT imaging of the abdomen and pelvis was performed using the standard protocol during bolus administration of intravenous contrast. Multiplanar reconstructed images and MIPs were obtained and reviewed to evaluate the vascular anatomy. RADIATION DOSE REDUCTION: This exam was performed according to the departmental dose-optimization program which includes automated exposure control, adjustment of the mA and/or kV according to patient size and/or use of iterative reconstruction technique. CONTRAST:  OMNIPAQUE  IOHEXOL  350 MG/ML SOLN COMPARISON:  CT 10/11/2022 FINDINGS: VASCULAR Aorta: Normal caliber aorta without aneurysm, dissection, vasculitis or significant stenosis. Celiac: Patent without evidence of aneurysm, dissection, vasculitis or significant stenosis. SMA: Patent without evidence of aneurysm, dissection, vasculitis or significant stenosis. Renals: Both renal arteries are patent without evidence of aneurysm, dissection, vasculitis, fibromuscular dysplasia or significant stenosis. IMA: Patent without evidence of aneurysm, dissection, vasculitis or significant stenosis. Inflow: Patent without evidence of aneurysm, dissection, vasculitis or significant stenosis. Mild atherosclerosis. Proximal Outflow: Bilateral common femoral and visualized portions of the superficial and profunda femoral arteries are patent without evidence of aneurysm, dissection, vasculitis or significant stenosis. Veins: Portal vein is poorly assessed on the current exam. There may be low-density within the extrahepatic portal vein, series 309, image 35, but no discrete filling defects seen. Missed ill-defined area of low-density extends to the upper aspect of the SMV, series 309 image 46. Iliac veins and IVC are patent. There  are multiple portosystemic collaterals. Review of the MIP images confirms the above findings. NON-VASCULAR Lower chest: Calcified mediastinal and right hilar lymph nodes consistent with prior granulomatous disease. No pleural fluid. [place] off a gel varices. Hepatobiliary: Cirrhosis with nodular hepatic contours. Ill-defined area of low-density in the anterior right lobe. There may be a small exophytic nodule from the anterior inferior right hepatic lobe series 309 image 43. multiple gallstones. No biliary dilatation. Pancreas: No ductal dilatation or evidence of mass. No specific peripancreatic inflammation. Spleen: Splenomegaly, 19.2 cm AP. Adrenals/Urinary Tract: No adrenal nodule. No hydronephrosis or renal calculi. No focal renal abnormality. Nondistended urinary bladder. Stomach/Bowel: There is no contrast extravasation in the GI tract to localize site of GI bleed. There esophageal varices. Nondistended stomach. No small bowel obstruction. There is circumferential rectal wall thickening. Wall thickening about cecum and ascending colon. Lymphatic: Enlarged portal caval and porta hepatis nodes, for example 12 mm series 303, image 71. There are multiple prominent central mesenteric nodes, 11 mm series 303, image 96. Reproductive: Prostate is unremarkable. Other: Moderate abdominopelvic ascites. Generalized mesenteric edema. Prior umbilical hernia repair. Musculoskeletal: Chronic bilateral L5 pars interarticularis defects. There are no acute or suspicious osseous abnormalities. IMPRESSION: VASCULAR 1. No extravasation of contrast into the GI tract to localize site of GI bleed. 2. Potential thrombus in the extrahepatic portal vein and upper SMV, ill-defined low-density but no discrete filling defect. Recommend ultrasound for further assessment. 3. Portal hypertension. NON-VASCULAR 1. Cirrhosis with portal hypertension and moderate ascites. Potential exophytic nodule from the right lobe of the liver. 2. Gallstones.  3. Wall thickening of the rectum as well as ascending colon may be due to portal colopathy. Colitis could have a similar appearance. Electronically Signed   By: Chadwick Colonel M.D.   On: 02/08/2023 18:04   DG Chest Portable 1 View Result Date: 02/08/2023 CLINICAL DATA:  Bloody stool.  Palpitations.  History of cirrhosis EXAM: PORTABLE CHEST 1 VIEW COMPARISON:  None Available. FINDINGS: Normal mediastinum and cardiac silhouette. Normal pulmonary vasculature. No evidence of effusion, infiltrate, or  pneumothorax. No acute bony abnormality. IMPRESSION: No acute cardiopulmonary process. Electronically Signed   By: Deboraha Fallow M.D.   On: 02/08/2023 17:40    Procedures Procedures    Medications Ordered in ED Medications  0.9 %  sodium chloride  infusion (has no administration in time range)  sodium chloride  0.9 % bolus 500 mL ( Intravenous Stopped 02/08/23 1735)  potassium chloride  10 mEq in 100 mL IVPB (0 mEq Intravenous Stopped 02/08/23 1951)  magnesium  sulfate IVPB 2 g 50 mL (0 g Intravenous Stopped 02/08/23 1735)  iohexol  (OMNIPAQUE ) 350 MG/ML injection 100 mL (100 mLs Intravenous Contrast Given 02/08/23 1625)  morphine  (PF) 4 MG/ML injection 4 mg (4 mg Intravenous Given 02/08/23 1901)  ondansetron  (ZOFRAN ) injection 4 mg (4 mg Intravenous Given 02/08/23 1900)    ED Course/ Medical Decision Making/ A&P                                 Medical Decision Making Amount and/or Complexity of Data Reviewed Labs: ordered. Radiology: ordered.  Risk Prescription drug management. Decision regarding hospitalization.   This patient presents to the ED for concern of abd pain and rectal bleeding, this involves an extensive number of treatment options, and is a complaint that carries with it a high risk of complications and morbidity.  The differential diagnosis includes appendicitis, cholecystitis, sbp, uti   Co morbidities that complicate the patient evaluation  htn, alcoholic cirrhosis, and  arthritis   Additional history obtained:  Additional history obtained from epic chart review External records from outside source obtained and reviewed including father   Lab Tests:  I Ordered, and personally interpreted labs.  The pertinent results include:  cbc with wbc low at 1.2, hgb 11.7 and plt low at 67 (hgb 12.6 yest and 13.9 in sept); cmp with na low at 130, k low at 2.7, TB elevated at 8.2 (TB 3.9 in Sept) and mg low at 1.5, inr 2.1; ammonia 21   Imaging Studies ordered:  I ordered imaging studies including cxr, ct abd/pelvis  I independently visualized and interpreted imaging which showed  CXR: No acute cardiopulmonary process.  CT abd/pelvis: VASCULAR    1. No extravasation of contrast into the GI tract to localize site  of GI bleed.  2. Potential thrombus in the extrahepatic portal vein and upper SMV,  ill-defined low-density but no discrete filling defect. Recommend  ultrasound for further assessment.  3. Portal hypertension.    NON-VASCULAR    1. Cirrhosis with portal hypertension and moderate ascites.  Potential exophytic nodule from the right lobe of the liver.  2. Gallstones.  3. Wall thickening of the rectum as well as ascending colon may be  due to portal colopathy. Colitis could have a similar appearance.   I agree with the radiologist interpretation   Cardiac Monitoring:  The patient was maintained on a cardiac monitor.  I personally viewed and interpreted the cardiac monitored which showed an underlying rhythm of: nsr   Medicines ordered and prescription drug management:  I ordered medication including ivfs/kcl, mg  for sx  Reevaluation of the patient after these medicines showed that the patient improved I have reviewed the patients home medicines and have made adjustments as needed   Test Considered:  ct   Critical Interventions:  K, mg   Consultations Obtained:  I requested consultation with the hospitalist (Dr. Michell Ahumada),  and  discussed lab and imaging findings as well as  pertinent plan - she will admit Dr. Dominic Friendly ([facility]) sent a secure chat for consult in am.  He will have 1 of his colleagues see pt in the am.   Problem List / ED Course:  Hypokalemia/hypomagnesemia:  pt given k and mg Pancytopenia:  likely due to worsening liver disease and rectal bleeding.  Elevation in TB:  likely due to worsening liver disease ? Thrombus in extrahepatic portal vein.  I would not anticoagulate at this time due to active rectal bleeding Abd pain:  likely due to cirrhosis Rectal bleeding:  neg cta.  Pt does have hemorrhoids.  No vomiting blood.   Reevaluation:  After the interventions noted above, I reevaluated the patient and found that they have :improved   Social Determinants of Health:  Lives at home   Dispostion:  After consideration of the diagnostic results and the patients response to treatment, I feel that the patent would benefit from admission.    CRITICAL CARE Performed by: Sueellen Emery   Total critical care time: 30 minutes  Critical care time was exclusive of separately billable procedures and treating other patients.  Critical care was necessary to treat or prevent imminent or life-threatening deterioration.  Critical care was time spent personally by me on the following activities: development of treatment plan with patient and/or surrogate as well as nursing, discussions with consultants, evaluation of patient's response to treatment, examination of patient, obtaining history from patient or surrogate, ordering and performing treatments and interventions, ordering and review of laboratory studies, ordering and review of radiographic studies, pulse oximetry and re-evaluation of patient's condition.         Final Clinical Impression(s) / ED Diagnoses Final diagnoses:  Pancytopenia (HCC)  Hypokalemia  Hypomagnesemia  Alcoholic cirrhosis of liver with ascites (HCC)  Rectal bleeding    Rx /  DC Orders ED Discharge Orders     None         Sueellen Emery, MD 02/08/23 2020

## 2023-02-08 NOTE — ED Notes (Signed)
 ED TO INPATIENT HANDOFF REPORT  ED Nurse Name and Phone #: Rudy Costain [facility] Paramedic [phone]  S Name/Age/Gender Joshua Hubbard 48 y.o. male Room/Bed: MH02/MH02  Code Status   Code Status: Prior  Home/SNF/Other Home Patient oriented to: self, place, time, and situation Is this baseline? Yes   Triage Complete: Triage complete  Chief Complaint Acute GI bleeding [K92.2]  Triage Note Pt with bloody stools x couple days; c/o intermittent dizziness; abd is tended to to palpation and with movement; pt is jaundiced, hx of cirrhosis    Allergies Allergies  Allergen Reactions   Spironolactone  Other (See Comments)    Gynecomastia    Tramadol      Made him feel hot and shakey    Level of Care/Admitting Diagnosis ED Disposition     ED Disposition  Admit   Condition  --   Comment  Hospital Area: [facility] [[id number]]  Level of Care: Progressive [102]  Admit to Progressive based on following criteria: GI, ENDOCRINE disease patients with GI bleeding, acute liver failure or pancreatitis, stable with diabetic ketoacidosis or thyrotoxicosis (hypothyroid) state.  May admit patient to [facility] or [facility] if equivalent level of care is available:: Yes  Interfacility transfer: Yes  Covid Evaluation: Asymptomatic - no recent exposure (last 10 days) testing not required  Diagnosis: Acute GI bleeding [098119]  Admitting Physician: Verlene Glimpse [[id number]]  Attending Physician: HAVILAND, JULIE [phone]  Certification:: I certify this patient will need inpatient services for at least 2 midnights  Expected Medical Readiness: 02/10/2023          B Medical/Surgery History Past Medical History:  Diagnosis Date   Arthritis    Blood transfusion without reported diagnosis    recevied 4 units PRBC and FFP during hospitalization from 1/17-1/22/21   Cirrhosis (HCC)    Hypertension    PONV (postoperative nausea and vomiting)    Tears of meniscus and ACL  of left knee 03/19/2018   Past Surgical History:  Procedure Laterality Date   ARTHROSCOPIC REPAIR ACL     x2   BIOPSY  10/05/2018   Procedure: BIOPSY;  Surgeon: Lanita Pitman, MD;  Location: [facility] ENDOSCOPY;  Service: Endoscopy;;   BIOPSY  03/07/2022   Procedure: BIOPSY;  Surgeon: Kenney Peacemaker, MD;  Location: [facility] ENDOSCOPY;  Service: Gastroenterology;;   ESOPHAGEAL BANDING  02/12/2019   Procedure: ESOPHAGEAL BANDING;  Surgeon: Felecia Hopper, MD;  Location: [facility] ENDOSCOPY;  Service: Gastroenterology;;   ESOPHAGEAL BANDING  12/15/2019   Procedure: ESOPHAGEAL BANDING;  Surgeon: Ozell Blunt, MD;  Location: [facility] ENDOSCOPY;  Service: Endoscopy;;   ESOPHAGEAL BANDING  03/07/2022   Procedure: ESOPHAGEAL BANDING;  Surgeon: Kenney Peacemaker, MD;  Location: [facility] ENDOSCOPY;  Service: Gastroenterology;;   ESOPHAGOGASTRODUODENOSCOPY (EGD) WITH PROPOFOL  N/A 10/05/2018   Procedure: ESOPHAGOGASTRODUODENOSCOPY (EGD) WITH PROPOFOL ;  Surgeon: Lanita Pitman, MD;  Location: [facility] ENDOSCOPY;  Service: Endoscopy;  Laterality: N/A;   ESOPHAGOGASTRODUODENOSCOPY (EGD) WITH PROPOFOL  N/A 02/12/2019   Procedure: ESOPHAGOGASTRODUODENOSCOPY (EGD) WITH PROPOFOL ;  Surgeon: Felecia Hopper, MD;  Location: [facility] ENDOSCOPY;  Service: Gastroenterology;  Laterality: N/A;   ESOPHAGOGASTRODUODENOSCOPY (EGD) WITH PROPOFOL  N/A 12/15/2019   Procedure: ESOPHAGOGASTRODUODENOSCOPY (EGD) WITH PROPOFOL ;  Surgeon: Ozell Blunt, MD;  Location: [facility] ENDOSCOPY;  Service: Endoscopy;  Laterality: N/A;   ESOPHAGOGASTRODUODENOSCOPY (EGD) WITH PROPOFOL  N/A 10/13/2020   Procedure: ESOPHAGOGASTRODUODENOSCOPY (EGD) WITH PROPOFOL ;  Surgeon: Kenney Peacemaker, MD;  Location: [facility] ENDOSCOPY;  Service: Endoscopy;  Laterality: N/A;   ESOPHAGOGASTRODUODENOSCOPY (EGD) WITH PROPOFOL  N/A 03/07/2022   Procedure:  ESOPHAGOGASTRODUODENOSCOPY (EGD) WITH PROPOFOL ;  Surgeon: Kenney Peacemaker, MD;  Location: [facility] ENDOSCOPY;  Service: Gastroenterology;  Laterality: N/A;   HERNIA REPAIR      x3   IR PARACENTESIS  12/20/2019   IR PARACENTESIS  12/25/2019   IR PARACENTESIS  01/07/2020   IR PARACENTESIS  01/10/2020   IR PARACENTESIS  01/15/2020   IR PARACENTESIS  01/21/2020   IR PARACENTESIS  01/29/2020   IR PARACENTESIS  02/18/2020   KNEE ARTHROSCOPY WITH ANTERIOR CRUCIATE LIGAMENT (ACL) REPAIR WITH HAMSTRING GRAFT Left 03/20/2018   Procedure: KNEE ARTHROSCOPY WITH REVESION ANTERIOR CRUCIATE LIGAMENT (ACL) REPAIR WITH  HAMSTRING ALLOGRAFT,;  Surgeon: Elly Habermann, MD;  Location: [facility];  Service: Orthopedics;  Laterality: Left;   KNEE ARTHROSCOPY WITH LATERAL MENISECTOMY Left 03/20/2018   Procedure: KNEE ARTHROSCOPY WITH LATERAL MENISECTOMY;  Surgeon: Elly Habermann, MD;  Location: [facility];  Service: Orthopedics;  Laterality: Left;   KNEE ARTHROSCOPY WITH MEDIAL MENISECTOMY Left 03/20/2018   Procedure: KNEE ARTHROSCOPY WITH MEDIAL MENISECTOMY;  Surgeon: Elly Habermann, MD;  Location: [facility];  Service: Orthopedics;  Laterality: Left;     A IV Location/Drains/Wounds Patient Lines/Drains/Airways Status     Active Line/Drains/Airways     Name Placement date Placement time Site Days   Peripheral IV 02/08/23 20 G 1.16" Left Antecubital 02/08/23  1601  Antecubital  less than 1            Intake/Output Last 24 hours  Intake/Output Summary (Last 24 hours) at 02/08/2023 2209 Last data filed at 02/08/2023 2005 Gross per 24 hour  Intake 841.16 ml  Output --  Net 841.16 ml    Labs/Imaging Results for orders placed or performed during the hospital encounter of 02/08/23 (from the past 48 hours)  Comprehensive metabolic panel     Status: Abnormal   Collection Time: 02/08/23  2:27 PM  Result Value Ref Range   Sodium 130 (L) 135 - 145 mmol/L   Potassium 2.7 (LL) 3.5 - 5.1 mmol/L    Comment: CRITICAL RESULT CALLED TO, READ BACK BY AND VERIFIED WITH C.ROBIN RN AT 1515 ON 02/08/23 BY I.[email or web address]   Chloride 99 98 - 111 mmol/L    CO2 23 22 - 32 mmol/L   Glucose, Bld 115 (H) 70 - 99 mg/dL    Comment: Glucose reference range applies only to samples taken after fasting for at least 8 hours.   BUN 12 6 - 20 mg/dL   Creatinine, Ser 4.09 (L) 0.61 - 1.24 mg/dL   Calcium  7.6 (L) 8.9 - 10.3 mg/dL   Total Protein 5.8 (L) 6.5 - 8.1 g/dL   Albumin  2.3 (L) 3.5 - 5.0 g/dL   AST 41 15 - 41 U/L   ALT 32 0 - 44 U/L   Alkaline Phosphatase 64 38 - 126 U/L   Total Bilirubin 8.2 (H) 0.0 - 1.2 mg/dL   GFR, Estimated >81 >19 mL/min    Comment: (NOTE) Calculated using the CKD-EPI Creatinine Equation (2021)    Anion gap 8 5 - 15    Comment: Performed at [facility], [address] [place], [place] [address]  CBC with Differential     Status: Abnormal   Collection Time: 02/08/23  2:27 PM  Result Value Ref Range   WBC 1.2 (LL) 4.0 - 10.5 K/uL    Comment: This critical result has verified and been called to ROBIN CLEMENT RN by Rhetta Cellar on 01 15 2025 at 1510, and  has been read back.    RBC 3.42 (L) 4.22 - 5.81 MIL/uL   Hemoglobin 11.7 (L) 13.0 - 17.0 g/dL   HCT 16.1 (L) 09.6 - 04.5 %   MCV 95.6 80.0 - 100.0 fL   MCH 34.2 (H) 26.0 - 34.0 pg   MCHC 35.8 30.0 - 36.0 g/dL   RDW 40.9 (H) 81.1 - 91.4 %   Platelets 67 (L) 150 - 400 K/uL    Comment: Immature Platelet Fraction may be clinically indicated, consider ordering this additional test [id number]    nRBC 0.0 0.0 - 0.2 %   Neutrophils Relative % 48 %   Neutro Abs 0.6 (L) 1.7 - 7.7 K/uL   Lymphocytes Relative 28 %   Lymphs Abs 0.3 (L) 0.7 - 4.0 K/uL   Monocytes Relative 18 %   Monocytes Absolute 0.2 0.1 - 1.0 K/uL   Eosinophils Relative 3 %   Eosinophils Absolute 0.0 0.0 - 0.5 K/uL   Basophils Relative 1 %   Basophils Absolute 0.0 0.0 - 0.1 K/uL   Smear Review PLATELETS APPEAR DECREASED     Comment: PLATELET COUNT CONFIRMED BY SMEAR   Immature Granulocytes 2 %   Abs Immature Granulocytes 0.02 0.00 - 0.07 K/uL   Reactive, Benign Lymphocytes PRESENT    Target  Cells PRESENT     Comment: Performed at [facility], [address] [place], [place] [address]  Magnesium      Status: Abnormal   Collection Time: 02/08/23  2:28 PM  Result Value Ref Range   Magnesium  1.5 (L) 1.7 - 2.4 mg/dL    Comment: Performed at [facility], [address] [place], [place] [address]  Protime-INR     Status: Abnormal   Collection Time: 02/08/23  2:28 PM  Result Value Ref Range   Prothrombin Time 24.1 (H) 11.4 - 15.2 seconds   INR 2.1 (H) 0.8 - 1.2    Comment: (NOTE) INR goal varies based on device and disease states. Performed at [facility], [address] [place], [place] [address]   Occult blood card to lab, stool Provider will collect     Status: Abnormal   Collection Time: 02/08/23  3:32 PM  Result Value Ref Range   Fecal Occult Bld POSITIVE (A) NEGATIVE    Comment: Performed at [facility], [address] [place], [place] [address]  Ethanol     Status: None   Collection Time: 02/08/23  4:19 PM  Result Value Ref Range   Alcohol, Ethyl (B) <10 <10 mg/dL    Comment: (NOTE) Lowest detectable limit for serum alcohol is 10 mg/dL.  For medical purposes only. Performed at [facility] [place], [address] [place], [place] [address]   Ammonia     Status: None   Collection Time: 02/08/23  4:19 PM  Result Value Ref Range   Ammonia 21 9 - 35 umol/L    Comment: Performed at [facility], [address] [place], [place] [address]   CT Angio Abd/Pel W and/or Wo Contrast Result Date: 02/08/2023 CLINICAL DATA:  Lower GI bleed.  History of cirrhosis. EXAM: CTA ABDOMEN AND PELVIS WITHOUT AND WITH CONTRAST TECHNIQUE: Multidetector CT imaging of the abdomen and pelvis was performed using the standard protocol during bolus administration of intravenous contrast. Multiplanar reconstructed images and MIPs were obtained and reviewed to evaluate the vascular anatomy. RADIATION DOSE REDUCTION: This exam was  performed according  to the departmental dose-optimization program which includes automated exposure control, adjustment of the mA and/or kV according to patient size and/or use of iterative reconstruction technique. CONTRAST:  OMNIPAQUE  IOHEXOL  350 MG/ML SOLN COMPARISON:  CT 10/11/2022 FINDINGS: VASCULAR Aorta: Normal caliber aorta without aneurysm, dissection, vasculitis or significant stenosis. Celiac: Patent without evidence of aneurysm, dissection, vasculitis or significant stenosis. SMA: Patent without evidence of aneurysm, dissection, vasculitis or significant stenosis. Renals: Both renal arteries are patent without evidence of aneurysm, dissection, vasculitis, fibromuscular dysplasia or significant stenosis. IMA: Patent without evidence of aneurysm, dissection, vasculitis or significant stenosis. Inflow: Patent without evidence of aneurysm, dissection, vasculitis or significant stenosis. Mild atherosclerosis. Proximal Outflow: Bilateral common femoral and visualized portions of the superficial and profunda femoral arteries are patent without evidence of aneurysm, dissection, vasculitis or significant stenosis. Veins: Portal vein is poorly assessed on the current exam. There may be low-density within the extrahepatic portal vein, series 309, image 35, but no discrete filling defects seen. Missed ill-defined area of low-density extends to the upper aspect of the SMV, series 309 image 46. Iliac veins and IVC are patent. There are multiple portosystemic collaterals. Review of the MIP images confirms the above findings. NON-VASCULAR Lower chest: Calcified mediastinal and right hilar lymph nodes consistent with prior granulomatous disease. No pleural fluid. [place] off a gel varices. Hepatobiliary: Cirrhosis with nodular hepatic contours. Ill-defined area of low-density in the anterior right lobe. There may be a small exophytic nodule from the anterior inferior right hepatic lobe series 309 image 43. multiple  gallstones. No biliary dilatation. Pancreas: No ductal dilatation or evidence of mass. No specific peripancreatic inflammation. Spleen: Splenomegaly, 19.2 cm AP. Adrenals/Urinary Tract: No adrenal nodule. No hydronephrosis or renal calculi. No focal renal abnormality. Nondistended urinary bladder. Stomach/Bowel: There is no contrast extravasation in the GI tract to localize site of GI bleed. There esophageal varices. Nondistended stomach. No small bowel obstruction. There is circumferential rectal wall thickening. Wall thickening about cecum and ascending colon. Lymphatic: Enlarged portal caval and porta hepatis nodes, for example 12 mm series 303, image 71. There are multiple prominent central mesenteric nodes, 11 mm series 303, image 96. Reproductive: Prostate is unremarkable. Other: Moderate abdominopelvic ascites. Generalized mesenteric edema. Prior umbilical hernia repair. Musculoskeletal: Chronic bilateral L5 pars interarticularis defects. There are no acute or suspicious osseous abnormalities. IMPRESSION: VASCULAR 1. No extravasation of contrast into the GI tract to localize site of GI bleed. 2. Potential thrombus in the extrahepatic portal vein and upper SMV, ill-defined low-density but no discrete filling defect. Recommend ultrasound for further assessment. 3. Portal hypertension. NON-VASCULAR 1. Cirrhosis with portal hypertension and moderate ascites. Potential exophytic nodule from the right lobe of the liver. 2. Gallstones. 3. Wall thickening of the rectum as well as ascending colon may be due to portal colopathy. Colitis could have a similar appearance. Electronically Signed   By: Chadwick Colonel M.D.   On: 02/08/2023 18:04   DG Chest Portable 1 View Result Date: 02/08/2023 CLINICAL DATA:  Bloody stool.  Palpitations.  History of cirrhosis EXAM: PORTABLE CHEST 1 VIEW COMPARISON:  None Available. FINDINGS: Normal mediastinum and cardiac silhouette. Normal pulmonary vasculature. No evidence of  effusion, infiltrate, or pneumothorax. No acute bony abnormality. IMPRESSION: No acute cardiopulmonary process. Electronically Signed   By: Deboraha Fallow M.D.   On: 02/08/2023 17:40    Pending Labs Unresulted Labs (From admission, onward)     Start     Ordered   02/08/23 1427  Urinalysis, Routine w reflex microscopic -  Urine, Clean Catch  Once,   URGENT       Question:  Specimen Source  Answer:  Urine, Clean Catch   02/08/23 1426            Vitals/Pain Today's Vitals   02/08/23 2015 02/08/23 2130 02/08/23 2136 02/08/23 2200  BP:  (!) 67/57  (!) 125/102  Pulse: 84   84  Resp: 14 (!) 21 20 18   Temp:   98.7 F (37.1 C)   TempSrc:   Oral   SpO2: 96%   90%  Weight:      Height:      PainSc:        Isolation Precautions No active isolations  Medications Medications  0.9 %  sodium chloride  infusion (has no administration in time range)  sodium chloride  0.9 % bolus 500 mL ( Intravenous Stopped 02/08/23 1735)  potassium chloride  10 mEq in 100 mL IVPB (0 mEq Intravenous Stopped 02/08/23 1951)  magnesium  sulfate IVPB 2 g 50 mL (0 g Intravenous Stopped 02/08/23 1735)  iohexol  (OMNIPAQUE ) 350 MG/ML injection 100 mL (100 mLs Intravenous Contrast Given 02/08/23 1625)  morphine  (PF) 4 MG/ML injection 4 mg (4 mg Intravenous Given 02/08/23 1901)  ondansetron  (ZOFRAN ) injection 4 mg (4 mg Intravenous Given 02/08/23 1900)    Mobility walks     Focused Assessments     R Recommendations: See Admitting Provider Note  Report given to:   Additional Notes:

## 2023-02-08 NOTE — ED Notes (Signed)
 Patient transported to CT

## 2023-02-09 ENCOUNTER — Inpatient Hospital Stay (HOSPITAL_COMMUNITY): Payer: BLUE CROSS/BLUE SHIELD

## 2023-02-09 DIAGNOSIS — D72819 Decreased white blood cell count, unspecified: Secondary | ICD-10-CM | POA: Insufficient documentation

## 2023-02-09 DIAGNOSIS — K922 Gastrointestinal hemorrhage, unspecified: Secondary | ICD-10-CM | POA: Diagnosis not present

## 2023-02-09 DIAGNOSIS — K7031 Alcoholic cirrhosis of liver with ascites: Secondary | ICD-10-CM

## 2023-02-09 DIAGNOSIS — D61818 Other pancytopenia: Principal | ICD-10-CM | POA: Insufficient documentation

## 2023-02-09 DIAGNOSIS — F1090 Alcohol use, unspecified, uncomplicated: Secondary | ICD-10-CM

## 2023-02-09 DIAGNOSIS — K625 Hemorrhage of anus and rectum: Secondary | ICD-10-CM

## 2023-02-09 DIAGNOSIS — R188 Other ascites: Secondary | ICD-10-CM

## 2023-02-09 HISTORY — PX: IR PARACENTESIS: IMG2679

## 2023-02-09 LAB — URINALYSIS, ROUTINE W REFLEX MICROSCOPIC
Glucose, UA: NEGATIVE mg/dL
Hgb urine dipstick: NEGATIVE
Ketones, ur: NEGATIVE mg/dL
Leukocytes,Ua: NEGATIVE
Nitrite: NEGATIVE
Protein, ur: 30 mg/dL — AB
Specific Gravity, Urine: >1.046 — ABNORMAL HIGH (ref 1.005–1.030)
pH: 5 (ref 5.0–8.0)

## 2023-02-09 LAB — COMPREHENSIVE METABOLIC PANEL
ALT: 25 U/L (ref 0–44)
AST: 35 U/L (ref 15–41)
Albumin: 1.9 g/dL — ABNORMAL LOW (ref 3.5–5.0)
Alkaline Phosphatase: 57 U/L (ref 38–126)
Anion gap: 7 (ref 5–15)
BUN: 8 mg/dL (ref 6–20)
CO2: 22 mmol/L (ref 22–32)
Calcium: 7.1 mg/dL — ABNORMAL LOW (ref 8.9–10.3)
Chloride: 100 mmol/L (ref 98–111)
Creatinine, Ser: 0.71 mg/dL (ref 0.61–1.24)
GFR, Estimated: >60 mL/min (ref >60)
Glucose, Bld: 108 mg/dL — ABNORMAL HIGH (ref 70–99)
Potassium: 2.9 mmol/L — ABNORMAL LOW (ref 3.5–5.1)
Sodium: 129 mmol/L — ABNORMAL LOW (ref 135–145)
Total Bilirubin: 6.8 mg/dL — ABNORMAL HIGH (ref 0.0–1.2)
Total Protein: 5.3 g/dL — ABNORMAL LOW (ref 6.5–8.1)

## 2023-02-09 LAB — DIFFERENTIAL
Abs Immature Granulocytes: 0.03 10*3/uL (ref 0.00–0.07)
Basophils Absolute: 0 10*3/uL (ref 0.0–0.1)
Basophils Relative: 1 %
Eosinophils Absolute: 0 10*3/uL (ref 0.0–0.5)
Eosinophils Relative: 4 %
Immature Granulocytes: 3 %
Lymphocytes Relative: 31 %
Lymphs Abs: 0.4 10*3/uL — ABNORMAL LOW (ref 0.7–4.0)
Monocytes Absolute: 0.4 10*3/uL (ref 0.1–1.0)
Monocytes Relative: 31 %
Neutro Abs: 0.3 10*3/uL — CL (ref 1.7–7.7)
Neutrophils Relative %: 30 %

## 2023-02-09 LAB — CBC
HCT: 29.7 % — ABNORMAL LOW (ref 39.0–52.0)
HCT: 32.2 % — ABNORMAL LOW (ref 39.0–52.0)
Hemoglobin: 10.6 g/dL — ABNORMAL LOW (ref 13.0–17.0)
Hemoglobin: 11.5 g/dL — ABNORMAL LOW (ref 13.0–17.0)
MCH: 34.3 pg — ABNORMAL HIGH (ref 26.0–34.0)
MCH: 34.5 pg — ABNORMAL HIGH (ref 26.0–34.0)
MCHC: 35.7 g/dL (ref 30.0–36.0)
MCHC: 35.7 g/dL (ref 30.0–36.0)
MCV: 96.1 fL (ref 80.0–100.0)
MCV: 96.7 fL (ref 80.0–100.0)
Platelets: 59 10*3/uL — ABNORMAL LOW (ref 150–400)
Platelets: 60 10*3/uL — ABNORMAL LOW (ref 150–400)
RBC: 3.09 MIL/uL — ABNORMAL LOW (ref 4.22–5.81)
RBC: 3.33 MIL/uL — ABNORMAL LOW (ref 4.22–5.81)
RDW: 16.7 % — ABNORMAL HIGH (ref 11.5–15.5)
RDW: 16.5 % — ABNORMAL HIGH (ref 11.5–15.5)
WBC: 1.1 10*3/uL — CL (ref 4.0–10.5)
WBC: 1.2 10*3/uL — CL (ref 4.0–10.5)
nRBC: 1.8 % — ABNORMAL HIGH (ref 0.0–0.2)
nRBC: 3.5 % — ABNORMAL HIGH (ref 0.0–0.2)

## 2023-02-09 LAB — LACTIC ACID, PLASMA
Lactic Acid, Venous: 1.3 mmol/L (ref 0.5–1.9)
Lactic Acid, Venous: 1.6 mmol/L (ref 0.5–1.9)

## 2023-02-09 LAB — ALBUMIN, PLEURAL OR PERITONEAL FLUID: Albumin, Fluid: <1.5 g/dL

## 2023-02-09 LAB — BODY FLUID CELL COUNT WITH DIFFERENTIAL
Eos, Fluid: 0 %
Lymphs, Fluid: 3 %
Monocyte-Macrophage-Serous Fluid: 89 % (ref 50–90)
Neutrophil Count, Fluid: 8 % (ref 0–25)
Total Nucleated Cell Count, Fluid: 168 uL (ref 0–1000)

## 2023-02-09 LAB — HIV ANTIBODY (ROUTINE TESTING W REFLEX): HIV Screen 4th Generation wRfx: NONREACTIVE

## 2023-02-09 LAB — GRAM STAIN

## 2023-02-09 LAB — LACTATE DEHYDROGENASE, PLEURAL OR PERITONEAL FLUID: LD, Fluid: 32 U/L — ABNORMAL HIGH (ref 3–23)

## 2023-02-09 LAB — MAGNESIUM: Magnesium: 1.7 mg/dL (ref 1.7–2.4)

## 2023-02-09 MED ORDER — SODIUM CHLORIDE 0.9 % IV SOLN
2.0000 g | Freq: Once | INTRAVENOUS | Status: AC
  Administered 2023-02-09: 2 g via INTRAVENOUS
  Filled 2023-02-09: qty 20

## 2023-02-09 MED ORDER — PANTOPRAZOLE SODIUM 40 MG PO TBEC
40.0000 mg | DELAYED_RELEASE_TABLET | Freq: Two times a day (BID) | ORAL | Status: DC
Start: 2023-02-09 — End: 2023-02-09
  Administered 2023-02-09: 40 mg via ORAL
  Filled 2023-02-09: qty 1

## 2023-02-09 MED ORDER — PANTOPRAZOLE SODIUM 40 MG IV SOLR
40.0000 mg | Freq: Two times a day (BID) | INTRAVENOUS | Status: DC
  Administered 2023-02-09 – 2023-02-13 (×8): 40 mg via INTRAVENOUS
  Filled 2023-02-09 (×9): qty 10

## 2023-02-09 MED ORDER — SODIUM CHLORIDE 0.9 % IV SOLN: INTRAVENOUS | Status: DC

## 2023-02-09 MED ORDER — VITAMIN K1 10 MG/ML IJ SOLN
10.0000 mg | Freq: Once | INTRAVENOUS | Status: AC
  Administered 2023-02-09: 10 mg via INTRAVENOUS
  Filled 2023-02-09: qty 1

## 2023-02-09 MED ORDER — SODIUM CHLORIDE 0.9 % IV SOLN
1.0000 g | INTRAVENOUS | Status: DC
  Administered 2023-02-09: 1 g via INTRAVENOUS
  Filled 2023-02-09: qty 10

## 2023-02-09 MED ORDER — ONDANSETRON HCL 4 MG PO TABS: 4.0000 mg | ORAL_TABLET | Freq: Three times a day (TID) | ORAL | Status: DC | PRN

## 2023-02-09 MED ORDER — ACETAMINOPHEN 500 MG PO TABS
500.0000 mg | ORAL_TABLET | Freq: Four times a day (QID) | ORAL | Status: DC | PRN
  Administered 2023-02-09 – 2023-02-10 (×2): 500 mg via ORAL
  Filled 2023-02-09 (×2): qty 1

## 2023-02-09 MED ORDER — LIDOCAINE HCL 1 % IJ SOLN
INTRAMUSCULAR | Status: AC
  Filled 2023-02-09: qty 20

## 2023-02-09 MED ORDER — LACTULOSE 10 GM/15ML PO SOLN
10.0000 g | Freq: Three times a day (TID) | ORAL | Status: DC
  Administered 2023-02-09 – 2023-02-13 (×12): 10 g via ORAL
  Filled 2023-02-09 (×12): qty 15

## 2023-02-09 MED ORDER — OXYCODONE HCL 5 MG PO TABS
15.0000 mg | ORAL_TABLET | Freq: Two times a day (BID) | ORAL | Status: DC | PRN
  Administered 2023-02-09 – 2023-02-11 (×5): 15 mg via ORAL
  Filled 2023-02-09 (×5): qty 3

## 2023-02-09 MED ORDER — SODIUM CHLORIDE 0.9 % IV SOLN: 2.0000 g | INTRAVENOUS | Status: DC

## 2023-02-09 MED ORDER — LIDOCAINE HCL 1 % IJ SOLN
20.0000 mL | Freq: Once | INTRAMUSCULAR | Status: AC
  Administered 2023-02-09: 10 mL via INTRADERMAL

## 2023-02-09 MED ORDER — PROPRANOLOL HCL 10 MG PO TABS
10.0000 mg | ORAL_TABLET | Freq: Two times a day (BID) | ORAL | Status: DC
Start: 2023-02-09 — End: 2023-02-09
  Filled 2023-02-09: qty 1

## 2023-02-09 MED ORDER — ALBUMIN HUMAN 25 % IV SOLN
25.0000 g | Freq: Once | INTRAVENOUS | Status: AC
  Administered 2023-02-09: 25 g via INTRAVENOUS
  Filled 2023-02-09: qty 100

## 2023-02-09 MED ORDER — MAGNESIUM SULFATE 2 GM/50ML IV SOLN
2.0000 g | Freq: Once | INTRAVENOUS | Status: AC
  Administered 2023-02-09: 2 g via INTRAVENOUS
  Filled 2023-02-09: qty 50

## 2023-02-09 MED ORDER — ACAMPROSATE CALCIUM 333 MG PO TBEC
333.0000 mg | DELAYED_RELEASE_TABLET | Freq: Three times a day (TID) | ORAL | Status: DC
  Administered 2023-02-09 – 2023-02-13 (×13): 333 mg via ORAL
  Filled 2023-02-09 (×15): qty 1

## 2023-02-09 MED ORDER — POTASSIUM CHLORIDE CRYS ER 20 MEQ PO TBCR
40.0000 meq | EXTENDED_RELEASE_TABLET | Freq: Four times a day (QID) | ORAL | Status: AC
  Administered 2023-02-09 (×2): 40 meq via ORAL
  Filled 2023-02-09 (×2): qty 2

## 2023-02-09 NOTE — Plan of Care (Signed)

## 2023-02-09 NOTE — Procedures (Signed)
PROCEDURE SUMMARY:  Successful ultrasound guided paracentesis from the left lower quadrant.  Yielded 2.5 L of clear yellow fluid.  No immediate complications.  The patient tolerated the procedure well.   Specimen sent for labs.  EBL < 2 mL  If the patient eventually requires >/=2 paracenteses in a 30 day period, screening evaluation by the [facility] will be assessed.  Alwyn Ren, AGACNP-BC 02/09/2023, 2:33 PM

## 2023-02-09 NOTE — Progress Notes (Addendum)
47 Y/O with hx of generalized abdominal pain and occasional N/V. Last vomitus was about 3 days ago. He endorsed noticing blood in his stool recently.  Exam: Gen: Calm in bed. No distress HEENT:EOMI, PERRLA Heart: S1 S2 normal, no murmurs. RRR Lungs: CTA B/L Abd: Enlarged, firm, tender, normoactive bowel sound Ext: No edema  A/P:  Abdominal pain + Ascites/Liver Cirrhosis/Portal HTN: Cirrhosis in the settings of ETOH use in the past CT abdomen reviewed Low-grade fever raised concerns for SBP I agree with A/B initiation F/U blood and body fluid cultures Transition to IV Protonix for now Holding home beta-blocker and diuretics in the settings on soft BP - reassess during this hospitalization. Will benefit from betablocker and his diuretics at some point given his portal HTN Consult IR for therapeutic and diagnostic paracentesis Replete Albumin following paracentesis Adjust pain regimen as needed He'll likely benefit from a liver transplant at some point, given his MELD score of 27  Acute GI bleed/Macrocytic anemia: +FOBT Likely related to the above in the settings on thrombocytopenia Avoid anticoagulants GI consulted for scoping Monitor his hemoglobin closely Blood transfusion threshold of hemoglobin <7  Neutropenia/Pancytopenia: In the setting of Liver disease Place infection precautions On A/B for presumed SPB - f/u blood culture F/U Ascites fluid culture/analysis Monitor fever curve Consider discussing Granulocyte colony-stimulating factor transfusion with hematology. Platelet/FFP transfusion if actively bleeding or platelet < 10,000   Electrolyte abnormality: Hyponatremia/Hypokalemia/Hypomagnesemia Likely related to his liver disease Potassium and magnesium repleted Monitor sodium closely - currently, no hydration  Possible Portal Vein Thrombosis: Obtain Duplex US to further assess

## 2023-02-09 NOTE — Assessment & Plan Note (Addendum)
WBC 1.1. Contributing factors may include sequestration from splenomegaly and suspected SBP. Blood smear in ED with decreased platelets and target cells. - CTM with CBC

## 2023-02-09 NOTE — Plan of Care (Addendum)
FMTS Interim Progress Note  S: Patient is doing well this morning resting comfortably in bed.  He has no concerns other than some abdominal pain at this time.  He reports that he has not had a drink since 2021 and has been attending AA.  He is parched and would like a small drink of water otherwise he is stable.  O: BP (!) 99/52 (BP Location: Left Arm)   Pulse 88   Temp 98.7 F (37.1 C) (Oral)   Resp 17   Ht 5\' 9"  (1.753 m)   Wt 81.3 kg   SpO2 92%   BMI 26.47 kg/m   General: Awake and Alert in NAD HEENT: NCAT. Scleral icterus. No rhinorrhea. Cardiovascular: RRR. No M/R/G Respiratory: CTAB, normal WOB on RA. No wheezing, crackles, rhonchi, or diminished breath sounds. Abdomen: Taut, distended throughout. Diffusely tender to light palpation especially midline, but no peripherally. Central midline area of protrusion.  Extremities: Able to move all extremities equally. No BLE edema, no deformities or significant joint findings. SCDs on. Skin: Warm and dry. Jaundiced primarily on his face. Neuro: A&Ox3. No focal neurological deficits.  A/P: Acute GIB Patient presented with BRBPR, FOBT positive.  CT demonstrated potential thrombus in extrahepatic portal vein and upper SMV, and wall thickening of rectum and descending colon which is concerning for portal hypertensive colopathy.  Hemoglobin stable at 11.7>10.6 (baseline ~13).  Soft BPs this AM. - Protonix 40 mg BID - Currently NPO, awaiting GI eval and recs - Liver doppler ordered to assess SMV and portal vein  Cirrhosis of Liver w/ Ascites c/f SBP CT demonstrated moderate ascites.  Patient continues to have of severe abdominal pain primarily in the midline.  Afebrile with max temp of 100.3, which has improved. T. bili improved from 8.2>6.8.  Lactic acid normal. No hx of SBP in the past but has numerous paracenteses.  - S/p ceftriaxone 1 g due to suspicion of SBP - Blood cultures drawn prior to abx - IR consulted for paracentesis  (diagnostic and therapeutic); unable to collect prior to abx - Oxycodone 15 mg BID PRN for pain - Tylenol 500 mg q6 PRN for fever, could consider IV Tylenol if NPO - Continue IV Protonix 40 mg BID (received 40 po over night), Lactulose 10 mg TID, Acamprosate 333 TID  Hypoalbuminemia Albumin 1.9. - Repletion dependent on amount removed by paracentesis. - Will consider repleting 6-8g/liter removed due to concern for SBP.  Hypokalemia K 2.7. Repleted with 40 meq - Monitor with BMP  Hypomagnesemia  Mg 1.5. Repleted with 2g. - Monitor with Mag  Leukopenia WBC 1.1 on admission. Contributing factors may include sequestration from splenomegaly and suspected SBP. Blood smear in ED with decreased platelets and target cells. - Monitor with CBC w/ diff  Chronic and Stable Conditions: AUD - Pt denies alcohol use since 2021, continue acamprosate. CIWAs without ativan. Chronic pain - oxycodone 15 mg BID prn  Fortunato Curling, DO 02/09/2023, 8:32 AM PGY-1, [facility] pager [phone]

## 2023-02-09 NOTE — Assessment & Plan Note (Addendum)
CT with moderate ascites. Patient with severe abdominal pain and temperature to 100.3 concerning for SBP. Tbili elevated to 8.2 on arrival.  - Ceftriaxone 1 g ordered given suspicion for SBP  - Blood cultures, urine culture ordered  - Lactic acid ordered  - IR consult for paracentesis (diagnostic and therapeutic) - IR unfortunately unable to collect diagnostic paracentesis prior to abx  - Home oxycodone 15 BID PRN for pain  - Tylenol 500 q6 PRN for fever  - Protonix 40 IV BID (received 40 po over night), lactulose 10 TID (NH3 21), acamprosate 333 TID - holding home Lasix and Eplerenone iso soft BPs - resume as indicated  - Holding home propanolol 10 BID given concern for SBP - AM CMP, Mg - replete electrolytes as needed

## 2023-02-09 NOTE — Plan of Care (Addendum)
FMTS Interim Progress Note  S: Patient notes feeling better after paracentesis today though continues to have abdominal pain and R flank pain. Was able to tolerate liquids okay. Reports 2 episodes of bloody BM since admission. No difficulty breathing or chest pain. Has been having intermittent cramps of both feet.   O: BP 130/69 (BP Location: Left Arm)   Pulse (!) 105   Temp 97.9 F (36.6 C) (Oral)   Resp 19   Ht 5\' 9"  (1.753 m)   Wt 81.3 kg   SpO2 96%   BMI 26.47 kg/m    General: No acute distress.  CV: Normal S1/S2. No extra heart sounds. Warm and well-perfused. Pulm: Breathing comfortably on room air. CTAB. No increased WOB. Abd: BS present. Improving distention. Diffusely tender to mild palpation. R CVA tenderness.  Skin:  Warm, dry. Psych: Pleasant and appropriate.   A/P: Cirrhosis with ascites c/f SBP S/p paracentesis, removed of 2.5 L fluid with some symptomatic improvement. Peritoneal fluid gram stain without organisms, culture pending. Continue CTX for SBP.   CVA tenderness Suspect element of pain secondary to cirrhosis and hepatic congestion. UA without leukocytes or nitrites. Urine culture pending. CT A/P and CTA without acute renal findings. On abx for suspected SBP. Will CTM at this time.   Continue treatment plan as otherwise indicated in FMTS Daily Progress Note.    Addendum 02/10/23 6:45 AM Saw patient at bedside. No acute distress. Reports getting rest overnight. Had a nonbloody BM. Reports hesitancy with urination, no dysuria or hematuria. No difficulty breathing or CP. Was placed on 2L O2 for sats to low 90s.  - C diff positive - Started vancomycin 125 QID, d/c CTX - CXR ordered  Ivery Quale, MD 02/09/2023, 8:59 PM PGY-1, [facility] Family Medicine Service pager [phone]

## 2023-02-09 NOTE — Hospital Course (Addendum)
Joshua Hubbard is a 48 y.o. with history of hx of cirrhosis, hepatic encephalopathy, AUD, HTN who was admitted for BRBPR and abdominal swelling.  Abdominal Pain  Decompensated Cirrhosis:  Presented to the ED with BRBPR and abdominal pain, CT with potential thrombus in extrahepatic portal vein and upper SMV, and wall thickening of rectum and ascending colon, concerning for portal hypertensive colopathy. GI saw while in hospital and recommended EGD that showed ***. MELD 27.   Other chronic conditions were medically managed with home medications and formulary alternatives as necessary (***)  Follow-up recommendations PPI BID for 8 weeks  Repeat EGD in 4 weeks

## 2023-02-09 NOTE — H&P (Addendum)
Hospital Admission History and Physical Service Pager: [phone]  Patient name: Joshua Hubbard Medical record number: [id number] Date of Birth: [birth year] Age: 48 y.o. Gender: male  Primary Care Provider: Nestor Ramp, MD Consultants: GI Code Status: Full code Preferred Emergency Contact:    Name Relation Home Work Mobile   Ing,Jim Father [phone]  [phone]   Youssif, Hamer [phone]  [phone]    Chief Complaint: Bloody stools  Assessment and Plan: Joshua Hubbard is a 48 y.o. male with hx of cirrhosis, hepatic encephalopathy, AUD, HTN presenting with BRBPR and abdominal swelling. Differential for this patient's presentation of this includes:  Portal HTN: Portal hypertensive colopathy likely given hx of cirrhosis and CT findings including colon wall thickening and possible portal vein thrombus.  Hemorrhoids: Possible given timing of BRBPR following BM and hx of hemorrhoids.  Fissure: Possible given timing of BRBPR following BM.  Infectious colitis: Possible though WBC low and no temperature >100.4.  IBD: Less likely given acute onset and no pmh of IBD.  Polyps/malignancy: does have ? Exophytic nodule from R lobe of liver Diverticulosis: Unlikely as this was not seen on imaging.   Assessment & Plan Acute GI bleeding Presenting for BRBPR, FOB positive. CT with potential thrombus in extrahepatic portal vein and upper SMV, and wall thickening of rectum and ascending colon, concerning for portal hypertensive colopathy. Hgb at 11.7 on arrival, baseline around 13. VSS. - Admit to Progressive with FMTS and attending Dr Pollie Meyer - ED consulted GI, greatly appreciate their recommendations  - Home protonix 40 BID - Consider RUQ doppler US after paracentesis for further assessment of potential thrombus seen on CT - Continuous cardiac monitoring - NPO - Fall precautions  - Vital signs per floor  - AM CBC, CMP, Mg - replete electrolytes as indicated Cirrhosis of  liver with ascites c/f SBP CT with moderate ascites. Patient with severe abdominal pain and temperature to 100.3 concerning for SBP. Tbili elevated to 8.2 on arrival.  - Ceftriaxone 1 g ordered given suspicion for SBP  - Blood cultures, urine culture ordered  - Lactic acid ordered  - IR consult for paracentesis (diagnostic and therapeutic) - IR unfortunately unable to collect diagnostic paracentesis prior to abx  - Home oxycodone 15 BID PRN for pain  - Tylenol 500 q6 PRN for fever  - Protonix 40 IV BID (received 40 po over night), lactulose 10 TID (NH3 21), acamprosate 333 TID - holding home Lasix and Eplerenone iso soft BPs - resume as indicated  - Holding home propanolol 10 BID given concern for SBP - AM CMP, Mg - replete electrolytes as needed   Leukopenia WBC 1.1. Contributing factors may include sequestration from splenomegaly and suspected SBP. Blood smear in ED with decreased platelets and target cells. - CTM with CBC  Chronic and Stable Conditions: AUD - Pt denies alcohol use since 2021, continue acamprosate Chronic pain - oxycodone 15 mg BID prn  FEN/GI: NPO VTE Prophylaxis: SCDs  Disposition: Progressive with FMTS and attending Dr Pollie Meyer   History of Present Illness:  Joshua Hubbard is a 48 y.o. male presenting with 2-3 episodes/day of BRBPR with loose stool for past 3 days. Described as drops of blood following the BM. Does have normal bms with no blood in between. Admits to 1 episode vomiting 3 days ago, no blood, no coffee grounds, has not vomited since. Also admits to nausea, bilateral flank pain and central abdominal/epigastric pain for past 3 days. Admits to light headedness,  fatigue, feeling cold, abdominal swelling. Denies dysuria, hematuria, fevers, falls, SOB, chest pain, cold like symptoms, night sweats. Has pain if he eats too much so has been eating small meals. States he has GI bleed in Nov 2021. Stopped drinking after this and denies drinking alcohol since 2021.    In [facility] ED, patient hemodynamically stable with Hgb 11, WBC 1.2, K 2.7, Mg 1.5. Blood smear with target cells and low platelets. Received K and Mg supplementation. CT A&P with portal hypertension, potential thrombus in extrahepatic portal vein and upper SMV, esophageal varices, cirrhosis, moderate ascites, wall thickening of rectum and ascending colon. ED provider contacted [facility] who plan to see pt morning of 1/16. Patient transferred to [facility] for admission.   Review Of Systems: Per HPI   Pertinent Past Medical History: Cirrhosis  AUD HTN Remainder reviewed in history tab.   Pertinent Past Surgical History: Multiple EGDs Multiple paracenteses Esophageal banding  Hernia repair  Remainder reviewed in history tab.   Pertinent Social History: Tobacco use: No Alcohol use: Not since 2021 Other Substance use: No Lives alone  Pertinent Family History: Father - CAD, HLD Remainder reviewed in history tab.   Important Outpatient Medications: Oxycodone 15 mg BID prn - not taking frequently Eplerenone 50 mg daily  Lasix 20 mg at bedtime Propanolol 10 mg BID Acamprosate 333 mg 2 tablets TID - taking 1 tablet TID  Lactulose 30 ml TID Zofran 4 mg prn Protonix 40 mg BID   Remainder reviewed in medication history.   Objective: BP 109/67 (BP Location: Left Arm)   Pulse 85   Temp 99.6 F (37.6 C) (Oral)   Resp 20   Ht 5\' 9"  (1.753 m)   Wt 81.3 kg   SpO2 95%   BMI 26.47 kg/m  Exam: General: Well-appearing. Resting comfortably in room. Eyes: Scleral icterus bilaterally.  CV: Normal S1/S2. No extra heart sounds. Warm and well-perfused. Pulm: Breathing comfortably on room air. CTAB. No increased WOB. Abd: Taut, distended throughout. Diffusely tender to light palpation. Central midline area of protrusion.  Skin:  Warm, dry. Diffusely jaundice. Delayed cap refill >2 seconds.  Psych: Pleasant and appropriate. Some circular speech and disjoint thought.   Labs:  CBC    Recent Labs  Lab 02/08/23 1427  WBC 1.2*  HGB 11.7*  HCT 32.7*  PLT 67*      CMP     Component Value Date/Time   NA 130 (L) 02/08/2023 1427   NA 135 10/12/2022 1041   K 2.7 (LL) 02/08/2023 1427   CL 99 02/08/2023 1427   CO2 23 02/08/2023 1427   GLUCOSE 115 (H) 02/08/2023 1427   BUN 12 02/08/2023 1427   BUN 5 (L) 10/12/2022 1041   CREATININE 0.56 (L) 02/08/2023 1427   CALCIUM 7.6 (L) 02/08/2023 1427   PROT 5.8 (L) 02/08/2023 1427   PROT 7.3 10/12/2022 1041   ALBUMIN 2.3 (L) 02/08/2023 1427   ALBUMIN 4.1 10/12/2022 1041   AST 41 02/08/2023 1427   ALT 32 02/08/2023 1427   ALKPHOS 64 02/08/2023 1427   BILITOT 8.2 (H) 02/08/2023 1427   BILITOT 3.9 (H) 10/12/2022 1041   GFR 111.17 04/20/2021 1230   EGFR 112 10/12/2022 1041   GFRNONAA >60 02/08/2023 1427   Ammonia 21 PT 24.1, INR 2.1 Stool FOB positive  EKG: No ischemic findings.  Imaging Studies Performed: CXR: No acute findings.  CT A&P:  -No extravasation of contrast in GI tract -Portal hypertension, Esophageal varices -Potential thrombus in  extrahepatic portal vein and upper SMV -Cirrhosis with portal hypertension and moderate ascites -Wall thickening of rectum and ascending colon    Ivery Quale, MD 02/09/2023, 12:21 AM PGY-1, [facility]  FPTS Intern pager: [phone], text pages welcome Secure chat group [facility] Teaching Service   I was personally present and re-performed the exam and medical decision making and verified the service and findings are accurately documented in Dr. Renata Caprice note.  Erick Alley, DO 02/09/2023 6:21 AM

## 2023-02-09 NOTE — H&P (View-Only) (Signed)
 Consultation Note   Referring Provider:  Family Medicine Teaching Service PCP: Joshua Ramp, MD Primary Gastroenterologist: Joshua Head, MD        Reason for Consultation: decompensated cirrhosis, GI bleed  DOA: 02/08/2023         Hospital Day: 2   ASSESSMENT    Brief Narrative:  48 y.o. year old male with a history of cholelithiasis, decompensated Etoh cirrhosis with esophageal varices, encephalopathy and ascites. He is followed by [facility]. Patietn admitted with abdominal pain, N/V/D, fever and abnormal CT scan   Decompensated Etoh cirrhosis.  MELD 3.0: 27 at 02/09/2023  2:44 AM Ascites present. Tbili 6.8, up from 3.9 in September.. INR elevated at 2.1. Slow mentation but no asterixis so possibly related to pain meds.  Abdominal pain Potential thrombus in the extrahepatic portal vein and upper SMV.   Wall thickening of ascending colon and rectum on CT scan which could just be colopathy related to cirrhosis.   Hypokalemia / hypomagnesemia Repletion in progress  Difficult urination. Lately having hard time initiating stream.   Principal Problem:   Acute GI bleeding Active Problems:   Ascites due to alcoholic cirrhosis (HCC)   GI bleed   Cirrhosis of liver with ascites c/f SBP   Leukopenia     PLAN:   --Continue Lactulose --Minimize narcotics if possible --Unclear whether he is taking Inderal. He says no though on home med list. He is groggy from pain meds so will need to clarify later.  --Diuretics on hold due to low BP --Beta blocker on hold for now --Continue empiric Rocephin --Continue BID PPI --Blood and urine cx pending --Electrolyte repletion in progress.  --Agree with diagnostic paracentesis as already ordered by admitting team --Will likely order liver doppler to better evaluate for possible thrombus. Anticoagulation risky --Does of IV Vitamin K ordered. Will repeat INR in am.  --HCC screening per  [facility]. Joshua Hubbard has a history of a liver lesion. Under surveillance but not suspected to be HCC. --He will most likely need an EGD for evaluation of maroon stool. Timing to be announced.  --Keep NPO for now --Defer urinary symptoms to admitting team   HPI   Joshua Hubbard was last seen by [place] in Feb 2024 for surveillance EGD. He is current with liver clinic appointments.   *History from patient is limited. He is groggy after pain medications.   Joshua Hubbard presented to ED with  fevers, abdominal pain, N/V/D. Endorsed bloody stools and confusion. Four about 4 days he has been having significant mid and upper abdominal pain, nearly constant. On Sunday he had a diarrheal stool but BMs relatively back to baseline on lactulose since then. However, he has been passing maroon colored blood in stool over last few days. He has been nauseated and vomited but emesis non-bloody.   WBC 1.1 ( abs neutrophils 0.6) Hgb 10.6, baseline 13.5 Platelets 59 INR 2.1 FOBT+  CT AP with contrast IMPRESSION: VASCULAR 1. No extravasation of contrast into the GI tract to localize site of GI bleed. 2. Potential thrombus in the extrahepatic portal vein and upper SMV, ill-defined low-density but no discrete filling defect. Recommend ultrasound for further assessment. 3. Portal hypertension.  NON-VASCULAR  1. Cirrhosis with portal hypertension and  moderate ascites. Potential exophytic nodule from the right lobe of the liver. 2. Gallstones. 3. Wall thickening of the rectum as well as ascending colon may be due to portal colopathy. Colitis could have a similar appearance.    Previous GI Evaluations   Feb 2024 EGD for follow up on esophageal varices - Grade II esophageal varices. Completely eradicated. Banded. - A few diminutive proximal gastric polyps. Biopsied. ? manifestion of portal gastropathy - Portal hypertensive gastropathy. Moderate - contact friablility. Exam otherwise normal.   Labs and Imaging: Recent Labs     02/07/23 1239 02/08/23 1427 02/09/23 0244  WBC 1.6* 1.2* 1.1*  HGB 12.6* 11.7* 10.6*  HCT 35.6* 32.7* 29.7*  PLT 74* 67* 59*   Recent Labs    02/07/23 1239 02/08/23 1427 02/09/23 0244  NA 134* 130* 129*  K 3.5 2.7* 2.9*  CL 103 99 100  CO2 23 23 22   GLUCOSE 114* 115* 108*  BUN 11 12 8   CREATININE 0.70 0.56* 0.71  CALCIUM 8.0* 7.6* 7.1*   Recent Labs    02/09/23 0244  PROT 5.3*  ALBUMIN 1.9*  AST 35  ALT 25  ALKPHOS 57  BILITOT 6.8*   No results for input(s): "HEPBSAG", "HCVAB", "HEPAIGM", "HEPBIGM" in the last 72 hours. Recent Labs    02/08/23 1428  LABPROT 24.1*  INR 2.1*      Past Medical History:  Diagnosis Date   Arthritis    Blood transfusion without reported diagnosis    recevied 4 units PRBC and FFP during hospitalization from 1/17-1/22/21   Cirrhosis (HCC)    Hypertension    PONV (postoperative nausea and vomiting)    Tears of meniscus and ACL of left knee 03/19/2018    Past Surgical History:  Procedure Laterality Date   ARTHROSCOPIC REPAIR ACL     x2   BIOPSY  10/05/2018   Procedure: BIOPSY;  Surgeon: Joshua Redbird, MD;  Location[facility] ENDOSCOPY;  Service: Endoscopy;;   BIOPSY  03/07/2022   Procedure: BIOPSY;  Surgeon: Joshua Boop, MD;  Location: [facility] ENDOSCOPY;  Service: Gastroenterology;;   ESOPHAGEAL BANDING  02/12/2019   Procedure: ESOPHAGEAL BANDING;  Surgeon: Joshua Der, MD;  Location: [facility] ENDOSCOPY;  Service: Gastroenterology;;   ESOPHAGEAL BANDING  12/15/2019   Procedure: ESOPHAGEAL BANDING;  Surgeon: Joshua Rigger, MD;  Location: [facility] ENDOSCOPY;  Service: Endoscopy;;   ESOPHAGEAL BANDING  03/07/2022   Procedure: ESOPHAGEAL BANDING;  Surgeon: Joshua Boop, MD;  Location: [facility] ENDOSCOPY;  Service: Gastroenterology;;   ESOPHAGOGASTRODUODENOSCOPY (EGD) WITH PROPOFOL N/A 10/05/2018   Procedure: ESOPHAGOGASTRODUODENOSCOPY (EGD) WITH PROPOFOL;  Surgeon: Joshua Redbird, MD;  Location: [facility] ENDOSCOPY;  Service: Endoscopy;  Laterality:  N/A;   ESOPHAGOGASTRODUODENOSCOPY (EGD) WITH PROPOFOL N/A 02/12/2019   Procedure: ESOPHAGOGASTRODUODENOSCOPY (EGD) WITH PROPOFOL;  Surgeon: Joshua Der, MD;  Location: [facility] ENDOSCOPY;  Service: Gastroenterology;  Laterality: N/A;   ESOPHAGOGASTRODUODENOSCOPY (EGD) WITH PROPOFOL N/A 12/15/2019   Procedure: ESOPHAGOGASTRODUODENOSCOPY (EGD) WITH PROPOFOL;  Surgeon: Joshua Rigger, MD;  Location: [facility] ENDOSCOPY;  Service: Endoscopy;  Laterality: N/A;   ESOPHAGOGASTRODUODENOSCOPY (EGD) WITH PROPOFOL N/A 10/13/2020   Procedure: ESOPHAGOGASTRODUODENOSCOPY (EGD) WITH PROPOFOL;  Surgeon: Joshua Boop, MD;  Location: [facility] ENDOSCOPY;  Service: Endoscopy;  Laterality: N/A;   ESOPHAGOGASTRODUODENOSCOPY (EGD) WITH PROPOFOL N/A 03/07/2022   Procedure: ESOPHAGOGASTRODUODENOSCOPY (EGD) WITH PROPOFOL;  Surgeon: Joshua Boop, MD;  Location: [facility] ENDOSCOPY;  Service: Gastroenterology;  Laterality: N/A;   HERNIA REPAIR     x3   IR PARACENTESIS  12/20/2019   IR PARACENTESIS  12/25/2019   IR PARACENTESIS  01/07/2020   IR PARACENTESIS  01/10/2020   IR PARACENTESIS  01/15/2020   IR PARACENTESIS  01/21/2020   IR PARACENTESIS  01/29/2020   IR PARACENTESIS  02/18/2020   KNEE ARTHROSCOPY WITH ANTERIOR CRUCIATE LIGAMENT (ACL) REPAIR WITH HAMSTRING GRAFT Left 03/20/2018   Procedure: KNEE ARTHROSCOPY WITH REVESION ANTERIOR CRUCIATE LIGAMENT (ACL) REPAIR WITH  HAMSTRING ALLOGRAFT,;  Surgeon: Salvatore Marvel, MD;  Location: [facility];  Service: Orthopedics;  Laterality: Left;   KNEE ARTHROSCOPY WITH LATERAL MENISECTOMY Left 03/20/2018   Procedure: KNEE ARTHROSCOPY WITH LATERAL MENISECTOMY;  Surgeon: Salvatore Marvel, MD;  Location: [facility];  Service: Orthopedics;  Laterality: Left;   KNEE ARTHROSCOPY WITH MEDIAL MENISECTOMY Left 03/20/2018   Procedure: KNEE ARTHROSCOPY WITH MEDIAL MENISECTOMY;  Surgeon: Salvatore Marvel, MD;  Location: [facility];  Service: Orthopedics;  Laterality: Left;     Family History  Problem Relation Age of Onset   Osteoarthritis Mother    Coronary artery disease Father    Hyperlipidemia Father     Prior to Admission medications   Medication Sig Start Date End Date Taking? Authorizing Provider  acamprosate (CAMPRAL) 333 MG tablet Take 2 tablets by mouth 3 (three) times daily. 11/18/22  Yes Joshua Ramp, MD  eplerenone (INSPRA) 50 MG tablet Take 1 tablet (50 mg total) by mouth daily. 04/06/22  Yes Joshua Ramp, MD  furosemide (LASIX) 20 MG tablet Take 1 tablet (20 mg total) by mouth at bedtime. 01/11/23  Yes Joshua Ramp, MD  lactulose (CHRONULAC) 10 GM/15ML solution TAKE 30 MLS (20 G TOTAL) BY MOUTH 3 TIMES DAILY. 05/29/22  Yes Joshua Ramp, MD  lidocaine (LIDODERM) 5 % Place 1 patch onto the skin daily. Remove & Discard patch within 12 hours or as directed by MD Patient taking differently: Place 1 patch onto the skin daily as needed (pain). 01/11/23  Yes Joshua Ramp, MD  ondansetron (ZOFRAN) 4 MG tablet Take 1 tablet (4 mg total) by mouth every 8 (eight) hours as needed for nausea or vomiting. 11/03/22  Yes Joshua Ramp, MD  oxyCODONE (ROXICODONE) 15 MG immediate release tablet Take 1 tablet (15 mg total) by mouth 2 (two) times daily as needed for musculoskeletal pain 11/18/22  Yes   pantoprazole (PROTONIX) 40 MG tablet Take 1 tablet (40 mg total) by mouth 2 (two) times daily. 11/18/22  Yes Joshua Ramp, MD  propranolol (INDERAL) 10 MG tablet Take 1 tablet (10 mg total) by mouth 2 (two) times daily. 01/11/23  Yes Joshua Ramp, MD    Current Facility-Administered Medications  Medication Dose Route Frequency Provider Last Rate Last Admin   0.9 %  sodium chloride infusion   Intravenous PRN Ivery Quale, MD       acamprosate (CAMPRAL) tablet 333 mg  333 mg Oral TID Ivery Quale, MD   333 mg at 02/09/23 0930   acetaminophen (TYLENOL) tablet 500 mg  500 mg Oral Q6H PRN Ivery Quale, MD   500 mg at 02/09/23 0454   lactulose (CHRONULAC) 10 GM/15ML solution 10  g  10 g Oral TID Ivery Quale, MD   10 g at 02/09/23 0929   ondansetron (ZOFRAN) tablet 4 mg  4 mg Oral Q8H PRN Ivery Quale, MD       oxyCODONE (Oxy IR/ROXICODONE) immediate release tablet 15 mg  15 mg Oral BID PRN Ivery Quale, MD   15 mg at 02/09/23 0454   pantoprazole (PROTONIX)  injection 40 mg  40 mg Intravenous BID Erick Alley, DO       potassium chloride SA (KLOR-CON M) CR tablet 40 mEq  40 mEq Oral Q6H Ivery Quale, MD   40 mEq at 02/09/23 0510    Allergies as of 02/08/2023 - Review Complete 02/08/2023  Allergen Reaction Noted   Spironolactone Other (See Comments) 09/15/2020   Tramadol  05/05/2020    Social History   Socioeconomic History   Marital status: Divorced    Spouse name: Not on file   Number of children: Not on file   Years of education: Not on file   Highest education level: Not on file  Occupational History   Occupation: Actuary with Sports Medicine    Employer: [employer]  Tobacco Use   Smoking status: Never   Smokeless tobacco: Never  Vaping Use   Vaping status: Never Used  Substance and Sexual Activity   Alcohol use: Yes    Alcohol/week: 42.0 standard drinks of alcohol    Types: 42 Cans of beer per week   Drug use: No   Sexual activity: Not on file  Other Topics Concern   Not on file  Social History Narrative   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 [place]   He previously taught elementary school math for 16 years   He lives in [place] with his wife and 2 daughters born 2007 and 2010 - divorced now   Former heavy alcohol user none in 2022 since late 2021   1 caffeinated beverage daily   Never smoker no other tobacco or drug use   Social Drivers of Corporate investment banker Strain: Not on file  Food Insecurity: No Food Insecurity (02/08/2023)   Hunger Vital Sign    Worried About Running Out of Food in the Last Year: Never true    Ran Out of Food in the Last Year: Never true   Transportation Needs: No Transportation Needs (02/08/2023)   PRAPARE - Administrator, Civil Service (Medical): No    Lack of Transportation (Non-Medical): No  Physical Activity: Not on file  Stress: Not on file  Social Connections: Socially Isolated (02/08/2023)   Social Connection and Isolation Panel [NHANES]    Frequency of Communication with Friends and Family: Three times a week    Frequency of Social Gatherings with Friends and Family: Once a week    Attends Religious Services: Never    Database administrator or Organizations: No    Attends Banker Meetings: Never    Marital Status: Divorced  Catering manager Violence: Not At Risk (02/08/2023)   Humiliation, Afraid, Rape, and Kick questionnaire    Fear of Current or Ex-Partner: No    Emotionally Abused: No    Physically Abused: No    Sexually Abused: No     Code Status   Code Status: Full Code  Review of Systems: All systems reviewed and negative except where noted in HPI.  Physical Exam: Vital signs in last 24 hours: Temp:  [98.7 F (37.1 C)-100.3 F (37.9 C)] 98.7 F (37.1 C) (01/16 0739) Pulse Rate:  [81-89] 88 (01/16 0739) Resp:  [11-23] 17 (01/16 0739) BP: (67-125)/(50-102) 99/52 (01/16 0739) SpO2:  [90 %-100 %] 92 % (01/16 0739) Weight:  [81.3 kg-81.6 kg] 81.3 kg (01/15 2246) Last BM Date : 02/09/23  General:  Pleasant male in NAD Psych:  Cooperative. Normal mood and affect Eyes: Pupils equal Ears:  Normal auditory acuity Nose: No deformity, discharge or lesions Neck:  Supple, no masses felt Lungs:  Clear to auscultation.  Heart:  Regular rate, regular rhythm.  Abdomen:  Soft, distended, diffusely tender, active bowel sounds, no masses felt Rectal :  Deferred Msk: Symmetrical without gross deformities.  Neurologic:  Alert, sluggish speech and mentation. No asterixis Extremities : No edema Skin:  Intact without significant lesions.    Intake/Output from previous day: 01/15  0701 - 01/16 0700 In: 964.5 [IV Piggyback:964.5] Out: 250 [Urine:250] Intake/Output this shift:  Total I/O In: 20 [P.O.:20] Out: -    Willette Cluster, NP-C   02/09/2023, 10:32 AM

## 2023-02-09 NOTE — Plan of Care (Addendum)
Called Radiology U/S about appropriate test to order to assess venous flow in liver. They recommend RUQ U/S Limited with doppler study, over Liver Doppler order 2/2 expense and patient needing to be NPO. Notified Liver Doppler required for SMV evaluation, and order was changed.

## 2023-02-09 NOTE — Plan of Care (Signed)
Consulted oncology, Dr. Mosetta Putt in the setting of leukopenia and neutropenia.  She recommended checking Vitamin B12 and Folate as well.  Oncology to follow, appreciate recommendations.   Fortunato Curling, DO [facility], PGY-1 02/09/23 12:36 PM  Service pager [phone]

## 2023-02-09 NOTE — Progress Notes (Signed)
Urine sample obtained, urinalysis and culture sent.

## 2023-02-09 NOTE — Assessment & Plan Note (Addendum)
Presenting for BRBPR, FOB positive. CT with potential thrombus in extrahepatic portal vein and upper SMV, and wall thickening of rectum and ascending colon, concerning for portal hypertensive colopathy. Hgb at 11.7 on arrival, baseline around 13. VSS. - Admit to Progressive with FMTS and attending Dr Pollie Meyer - ED consulted GI, greatly appreciate their recommendations  - Home protonix 40 BID - Consider RUQ doppler US after paracentesis for further assessment of potential thrombus seen on CT - Continuous cardiac monitoring - NPO - Fall precautions  - Vital signs per floor  - AM CBC, CMP, Mg - replete electrolytes as indicated

## 2023-02-09 NOTE — Consult Note (Signed)
Consultation Note   Referring Provider:  Family Medicine Teaching Service PCP: Nestor Ramp, MD Primary Gastroenterologist: Stan Head, MD        Reason for Consultation: decompensated cirrhosis, GI bleed  DOA: 02/08/2023         Hospital Day: 2   ASSESSMENT    Brief Narrative:  48 y.o. year old male with a history of cholelithiasis, decompensated Etoh cirrhosis with esophageal varices, encephalopathy and ascites. He is followed by [facility]. Patietn admitted with abdominal pain, N/V/D, fever and abnormal CT scan   Decompensated Etoh cirrhosis.  MELD 3.0: 27 at 02/09/2023  2:44 AM Ascites present. Tbili 6.8, up from 3.9 in September.. INR elevated at 2.1. Slow mentation but no asterixis so possibly related to pain meds.  Abdominal pain Potential thrombus in the extrahepatic portal vein and upper SMV.   Wall thickening of ascending colon and rectum on CT scan which could just be colopathy related to cirrhosis.   Hypokalemia / hypomagnesemia Repletion in progress  Difficult urination. Lately having hard time initiating stream.   Principal Problem:   Acute GI bleeding Active Problems:   Ascites due to alcoholic cirrhosis (HCC)   GI bleed   Cirrhosis of liver with ascites c/f SBP   Leukopenia     PLAN:   --Continue Lactulose --Minimize narcotics if possible --Unclear whether he is taking Inderal. He says no though on home med list. He is groggy from pain meds so will need to clarify later.  --Diuretics on hold due to low BP --Beta blocker on hold for now --Continue empiric Rocephin --Continue BID PPI --Blood and urine cx pending --Electrolyte repletion in progress.  --Agree with diagnostic paracentesis as already ordered by admitting team --Will likely order liver doppler to better evaluate for possible thrombus. Anticoagulation risky --Does of IV Vitamin K ordered. Will repeat INR in am.  --HCC screening per  [facility]. [name] has a history of a liver lesion. Under surveillance but not suspected to be HCC. --He will most likely need an EGD for evaluation of maroon stool. Timing to be announced.  --Keep NPO for now --Defer urinary symptoms to admitting team   HPI   [name] was last seen by [place] in Feb 2024 for surveillance EGD. He is current with liver clinic appointments.   *History from patient is limited. He is groggy after pain medications.   [name] presented to ED with  fevers, abdominal pain, N/V/D. Endorsed bloody stools and confusion. Four about 4 days he has been having significant mid and upper abdominal pain, nearly constant. On Sunday he had a diarrheal stool but BMs relatively back to baseline on lactulose since then. However, he has been passing maroon colored blood in stool over last few days. He has been nauseated and vomited but emesis non-bloody.   WBC 1.1 ( abs neutrophils 0.6) Hgb 10.6, baseline 13.5 Platelets 59 INR 2.1 FOBT+  CT AP with contrast IMPRESSION: VASCULAR 1. No extravasation of contrast into the GI tract to localize site of GI bleed. 2. Potential thrombus in the extrahepatic portal vein and upper SMV, ill-defined low-density but no discrete filling defect. Recommend ultrasound for further assessment. 3. Portal hypertension.  NON-VASCULAR  1. Cirrhosis with portal hypertension and  moderate ascites. Potential exophytic nodule from the right lobe of the liver. 2. Gallstones. 3. Wall thickening of the rectum as well as ascending colon may be due to portal colopathy. Colitis could have a similar appearance.    Previous GI Evaluations   Feb 2024 EGD for follow up on esophageal varices - Grade II esophageal varices. Completely eradicated. Banded. - A few diminutive proximal gastric polyps. Biopsied. ? manifestion of portal gastropathy - Portal hypertensive gastropathy. Moderate - contact friablility. Exam otherwise normal.   Labs and Imaging: Recent Labs     02/07/23 1239 02/08/23 1427 02/09/23 0244  WBC 1.6* 1.2* 1.1*  HGB 12.6* 11.7* 10.6*  HCT 35.6* 32.7* 29.7*  PLT 74* 67* 59*   Recent Labs    02/07/23 1239 02/08/23 1427 02/09/23 0244  NA 134* 130* 129*  K 3.5 2.7* 2.9*  CL 103 99 100  CO2 23 23 22   GLUCOSE 114* 115* 108*  BUN 11 12 8   CREATININE 0.70 0.56* 0.71  CALCIUM 8.0* 7.6* 7.1*   Recent Labs    02/09/23 0244  PROT 5.3*  ALBUMIN 1.9*  AST 35  ALT 25  ALKPHOS 57  BILITOT 6.8*   No results for input(s): "HEPBSAG", "HCVAB", "HEPAIGM", "HEPBIGM" in the last 72 hours. Recent Labs    02/08/23 1428  LABPROT 24.1*  INR 2.1*      Past Medical History:  Diagnosis Date   Arthritis    Blood transfusion without reported diagnosis    recevied 4 units PRBC and FFP during hospitalization from 1/17-1/22/21   Cirrhosis (HCC)    Hypertension    PONV (postoperative nausea and vomiting)    Tears of meniscus and ACL of left knee 03/19/2018    Past Surgical History:  Procedure Laterality Date   ARTHROSCOPIC REPAIR ACL     x2   BIOPSY  10/05/2018   Procedure: BIOPSY;  Surgeon: Bernette Redbird, MD;  Location[facility] ENDOSCOPY;  Service: Endoscopy;;   BIOPSY  03/07/2022   Procedure: BIOPSY;  Surgeon: Iva Boop, MD;  Location: [facility] ENDOSCOPY;  Service: Gastroenterology;;   ESOPHAGEAL BANDING  02/12/2019   Procedure: ESOPHAGEAL BANDING;  Surgeon: Kathi Der, MD;  Location: [facility] ENDOSCOPY;  Service: Gastroenterology;;   ESOPHAGEAL BANDING  12/15/2019   Procedure: ESOPHAGEAL BANDING;  Surgeon: Vida Rigger, MD;  Location: [facility] ENDOSCOPY;  Service: Endoscopy;;   ESOPHAGEAL BANDING  03/07/2022   Procedure: ESOPHAGEAL BANDING;  Surgeon: Iva Boop, MD;  Location: [facility] ENDOSCOPY;  Service: Gastroenterology;;   ESOPHAGOGASTRODUODENOSCOPY (EGD) WITH PROPOFOL N/A 10/05/2018   Procedure: ESOPHAGOGASTRODUODENOSCOPY (EGD) WITH PROPOFOL;  Surgeon: Bernette Redbird, MD;  Location: [facility] ENDOSCOPY;  Service: Endoscopy;  Laterality:  N/A;   ESOPHAGOGASTRODUODENOSCOPY (EGD) WITH PROPOFOL N/A 02/12/2019   Procedure: ESOPHAGOGASTRODUODENOSCOPY (EGD) WITH PROPOFOL;  Surgeon: Kathi Der, MD;  Location: [facility] ENDOSCOPY;  Service: Gastroenterology;  Laterality: N/A;   ESOPHAGOGASTRODUODENOSCOPY (EGD) WITH PROPOFOL N/A 12/15/2019   Procedure: ESOPHAGOGASTRODUODENOSCOPY (EGD) WITH PROPOFOL;  Surgeon: Vida Rigger, MD;  Location: [facility] ENDOSCOPY;  Service: Endoscopy;  Laterality: N/A;   ESOPHAGOGASTRODUODENOSCOPY (EGD) WITH PROPOFOL N/A 10/13/2020   Procedure: ESOPHAGOGASTRODUODENOSCOPY (EGD) WITH PROPOFOL;  Surgeon: Iva Boop, MD;  Location: [facility] ENDOSCOPY;  Service: Endoscopy;  Laterality: N/A;   ESOPHAGOGASTRODUODENOSCOPY (EGD) WITH PROPOFOL N/A 03/07/2022   Procedure: ESOPHAGOGASTRODUODENOSCOPY (EGD) WITH PROPOFOL;  Surgeon: Iva Boop, MD;  Location: [facility] ENDOSCOPY;  Service: Gastroenterology;  Laterality: N/A;   HERNIA REPAIR     x3   IR PARACENTESIS  12/20/2019   IR PARACENTESIS  12/25/2019   IR PARACENTESIS  01/07/2020   IR PARACENTESIS  01/10/2020   IR PARACENTESIS  01/15/2020   IR PARACENTESIS  01/21/2020   IR PARACENTESIS  01/29/2020   IR PARACENTESIS  02/18/2020   KNEE ARTHROSCOPY WITH ANTERIOR CRUCIATE LIGAMENT (ACL) REPAIR WITH HAMSTRING GRAFT Left 03/20/2018   Procedure: KNEE ARTHROSCOPY WITH REVESION ANTERIOR CRUCIATE LIGAMENT (ACL) REPAIR WITH  HAMSTRING ALLOGRAFT,;  Surgeon: Salvatore Marvel, MD;  Location: [facility];  Service: Orthopedics;  Laterality: Left;   KNEE ARTHROSCOPY WITH LATERAL MENISECTOMY Left 03/20/2018   Procedure: KNEE ARTHROSCOPY WITH LATERAL MENISECTOMY;  Surgeon: Salvatore Marvel, MD;  Location: [facility];  Service: Orthopedics;  Laterality: Left;   KNEE ARTHROSCOPY WITH MEDIAL MENISECTOMY Left 03/20/2018   Procedure: KNEE ARTHROSCOPY WITH MEDIAL MENISECTOMY;  Surgeon: Salvatore Marvel, MD;  Location: [facility];  Service: Orthopedics;  Laterality: Left;     Family History  Problem Relation Age of Onset   Osteoarthritis Mother    Coronary artery disease Father    Hyperlipidemia Father     Prior to Admission medications   Medication Sig Start Date End Date Taking? Authorizing Provider  acamprosate (CAMPRAL) 333 MG tablet Take 2 tablets by mouth 3 (three) times daily. 11/18/22  Yes Nestor Ramp, MD  eplerenone (INSPRA) 50 MG tablet Take 1 tablet (50 mg total) by mouth daily. 04/06/22  Yes Nestor Ramp, MD  furosemide (LASIX) 20 MG tablet Take 1 tablet (20 mg total) by mouth at bedtime. 01/11/23  Yes Nestor Ramp, MD  lactulose (CHRONULAC) 10 GM/15ML solution TAKE 30 MLS (20 G TOTAL) BY MOUTH 3 TIMES DAILY. 05/29/22  Yes Nestor Ramp, MD  lidocaine (LIDODERM) 5 % Place 1 patch onto the skin daily. Remove & Discard patch within 12 hours or as directed by MD Patient taking differently: Place 1 patch onto the skin daily as needed (pain). 01/11/23  Yes Nestor Ramp, MD  ondansetron (ZOFRAN) 4 MG tablet Take 1 tablet (4 mg total) by mouth every 8 (eight) hours as needed for nausea or vomiting. 11/03/22  Yes Nestor Ramp, MD  oxyCODONE (ROXICODONE) 15 MG immediate release tablet Take 1 tablet (15 mg total) by mouth 2 (two) times daily as needed for musculoskeletal pain 11/18/22  Yes   pantoprazole (PROTONIX) 40 MG tablet Take 1 tablet (40 mg total) by mouth 2 (two) times daily. 11/18/22  Yes Nestor Ramp, MD  propranolol (INDERAL) 10 MG tablet Take 1 tablet (10 mg total) by mouth 2 (two) times daily. 01/11/23  Yes Nestor Ramp, MD    Current Facility-Administered Medications  Medication Dose Route Frequency Provider Last Rate Last Admin   0.9 %  sodium chloride infusion   Intravenous PRN Ivery Quale, MD       acamprosate (CAMPRAL) tablet 333 mg  333 mg Oral TID Ivery Quale, MD   333 mg at 02/09/23 0930   acetaminophen (TYLENOL) tablet 500 mg  500 mg Oral Q6H PRN Ivery Quale, MD   500 mg at 02/09/23 0454   lactulose (CHRONULAC) 10 GM/15ML solution 10  g  10 g Oral TID Ivery Quale, MD   10 g at 02/09/23 0929   ondansetron (ZOFRAN) tablet 4 mg  4 mg Oral Q8H PRN Ivery Quale, MD       oxyCODONE (Oxy IR/ROXICODONE) immediate release tablet 15 mg  15 mg Oral BID PRN Ivery Quale, MD   15 mg at 02/09/23 0454   pantoprazole (PROTONIX)  injection 40 mg  40 mg Intravenous BID Erick Alley, DO       potassium chloride SA (KLOR-CON M) CR tablet 40 mEq  40 mEq Oral Q6H Ivery Quale, MD   40 mEq at 02/09/23 0510    Allergies as of 02/08/2023 - Review Complete 02/08/2023  Allergen Reaction Noted   Spironolactone Other (See Comments) 09/15/2020   Tramadol  05/05/2020    Social History   Socioeconomic History   Marital status: Divorced    Spouse name: Not on file   Number of children: Not on file   Years of education: Not on file   Highest education level: Not on file  Occupational History   Occupation: Actuary with Sports Medicine    Employer: [employer]  Tobacco Use   Smoking status: Never   Smokeless tobacco: Never  Vaping Use   Vaping status: Never Used  Substance and Sexual Activity   Alcohol use: Yes    Alcohol/week: 42.0 standard drinks of alcohol    Types: 42 Cans of beer per week   Drug use: No   Sexual activity: Not on file  Other Topics Concern   Not on file  Social History Narrative   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 [place]   He previously taught elementary school math for 16 years   He lives in [place] with his wife and 2 daughters born 2007 and 2010 - divorced now   Former heavy alcohol user none in 2022 since late 2021   1 caffeinated beverage daily   Never smoker no other tobacco or drug use   Social Drivers of Corporate investment banker Strain: Not on file  Food Insecurity: No Food Insecurity (02/08/2023)   Hunger Vital Sign    Worried About Running Out of Food in the Last Year: Never true    Ran Out of Food in the Last Year: Never true   Transportation Needs: No Transportation Needs (02/08/2023)   PRAPARE - Administrator, Civil Service (Medical): No    Lack of Transportation (Non-Medical): No  Physical Activity: Not on file  Stress: Not on file  Social Connections: Socially Isolated (02/08/2023)   Social Connection and Isolation Panel [NHANES]    Frequency of Communication with Friends and Family: Three times a week    Frequency of Social Gatherings with Friends and Family: Once a week    Attends Religious Services: Never    Database administrator or Organizations: No    Attends Banker Meetings: Never    Marital Status: Divorced  Catering manager Violence: Not At Risk (02/08/2023)   Humiliation, Afraid, Rape, and Kick questionnaire    Fear of Current or Ex-Partner: No    Emotionally Abused: No    Physically Abused: No    Sexually Abused: No     Code Status   Code Status: Full Code  Review of Systems: All systems reviewed and negative except where noted in HPI.  Physical Exam: Vital signs in last 24 hours: Temp:  [98.7 F (37.1 C)-100.3 F (37.9 C)] 98.7 F (37.1 C) (01/16 0739) Pulse Rate:  [81-89] 88 (01/16 0739) Resp:  [11-23] 17 (01/16 0739) BP: (67-125)/(50-102) 99/52 (01/16 0739) SpO2:  [90 %-100 %] 92 % (01/16 0739) Weight:  [81.3 kg-81.6 kg] 81.3 kg (01/15 2246) Last BM Date : 02/09/23  General:  Pleasant male in NAD Psych:  Cooperative. Normal mood and affect Eyes: Pupils equal Ears:  Normal auditory acuity Nose: No deformity, discharge or lesions Neck:  Supple, no masses felt Lungs:  Clear to auscultation.  Heart:  Regular rate, regular rhythm.  Abdomen:  Soft, distended, diffusely tender, active bowel sounds, no masses felt Rectal :  Deferred Msk: Symmetrical without gross deformities.  Neurologic:  Alert, sluggish speech and mentation. No asterixis Extremities : No edema Skin:  Intact without significant lesions.    Intake/Output from previous day: 01/15  0701 - 01/16 0700 In: 964.5 [IV Piggyback:964.5] Out: 250 [Urine:250] Intake/Output this shift:  Total I/O In: 20 [P.O.:20] Out: -    Willette Cluster, NP-C   02/09/2023, 10:32 AM

## 2023-02-10 ENCOUNTER — Inpatient Hospital Stay (HOSPITAL_COMMUNITY): Payer: BLUE CROSS/BLUE SHIELD

## 2023-02-10 ENCOUNTER — Encounter (HOSPITAL_COMMUNITY): Payer: Self-pay | Admitting: Family Medicine

## 2023-02-10 ENCOUNTER — Inpatient Hospital Stay (HOSPITAL_COMMUNITY): Payer: BLUE CROSS/BLUE SHIELD | Admitting: Anesthesiology

## 2023-02-10 ENCOUNTER — Other Ambulatory Visit (HOSPITAL_COMMUNITY): Payer: Self-pay

## 2023-02-10 ENCOUNTER — Encounter (HOSPITAL_COMMUNITY)
Admission: EM | Disposition: A | Discharge status: Home / Self Care | Payer: Self-pay | Source: Home / Self Care | Attending: Family Medicine

## 2023-02-10 ENCOUNTER — Telehealth (HOSPITAL_COMMUNITY): Payer: Self-pay | Admitting: Pharmacy Technician

## 2023-02-10 DIAGNOSIS — K7031 Alcoholic cirrhosis of liver with ascites: Secondary | ICD-10-CM | POA: Diagnosis not present

## 2023-02-10 DIAGNOSIS — I8501 Esophageal varices with bleeding: Secondary | ICD-10-CM

## 2023-02-10 DIAGNOSIS — K3189 Other diseases of stomach and duodenum: Secondary | ICD-10-CM

## 2023-02-10 DIAGNOSIS — K2981 Duodenitis with bleeding: Secondary | ICD-10-CM | POA: Diagnosis not present

## 2023-02-10 DIAGNOSIS — K922 Gastrointestinal hemorrhage, unspecified: Secondary | ICD-10-CM | POA: Diagnosis not present

## 2023-02-10 DIAGNOSIS — D61818 Other pancytopenia: Secondary | ICD-10-CM

## 2023-02-10 DIAGNOSIS — K625 Hemorrhage of anus and rectum: Secondary | ICD-10-CM | POA: Diagnosis not present

## 2023-02-10 HISTORY — PX: ESOPHAGOGASTRODUODENOSCOPY: SHX5428

## 2023-02-10 HISTORY — PX: ESOPHAGEAL BANDING: SHX5518

## 2023-02-10 LAB — COMPREHENSIVE METABOLIC PANEL
ALT: 22 U/L (ref 0–44)
AST: 36 U/L (ref 15–41)
Albumin: 2.4 g/dL — ABNORMAL LOW (ref 3.5–5.0)
Alkaline Phosphatase: 56 U/L (ref 38–126)
Anion gap: 7 (ref 5–15)
BUN: 9 mg/dL (ref 6–20)
CO2: 24 mmol/L (ref 22–32)
Calcium: 7.8 mg/dL — ABNORMAL LOW (ref 8.9–10.3)
Chloride: 101 mmol/L (ref 98–111)
Creatinine, Ser: 0.68 mg/dL (ref 0.61–1.24)
GFR, Estimated: >60 mL/min (ref >60)
Glucose, Bld: 101 mg/dL — ABNORMAL HIGH (ref 70–99)
Potassium: 3.6 mmol/L (ref 3.5–5.1)
Sodium: 132 mmol/L — ABNORMAL LOW (ref 135–145)
Total Bilirubin: 8.3 mg/dL — ABNORMAL HIGH (ref 0.0–1.2)
Total Protein: 5.8 g/dL — ABNORMAL LOW (ref 6.5–8.1)

## 2023-02-10 LAB — GASTROINTESTINAL PANEL BY PCR, STOOL (REPLACES STOOL CULTURE)

## 2023-02-10 LAB — CBC WITH DIFFERENTIAL/PLATELET
Abs Immature Granulocytes: 0.08 10*3/uL — ABNORMAL HIGH (ref 0.00–0.07)
Basophils Absolute: 0 10*3/uL (ref 0.0–0.1)
Basophils Relative: 1 %
Eosinophils Absolute: 0 10*3/uL (ref 0.0–0.5)
Eosinophils Relative: 2 %
HCT: 29.2 % — ABNORMAL LOW (ref 39.0–52.0)
Hemoglobin: 10.3 g/dL — ABNORMAL LOW (ref 13.0–17.0)
Immature Granulocytes: 4 %
Lymphocytes Relative: 28 %
Lymphs Abs: 0.5 10*3/uL — ABNORMAL LOW (ref 0.7–4.0)
MCH: 34.1 pg — ABNORMAL HIGH (ref 26.0–34.0)
MCHC: 35.3 g/dL (ref 30.0–36.0)
MCV: 96.7 fL (ref 80.0–100.0)
Monocytes Absolute: 0.7 10*3/uL (ref 0.1–1.0)
Monocytes Relative: 36 %
Neutro Abs: 0.5 10*3/uL — ABNORMAL LOW (ref 1.7–7.7)
Neutrophils Relative %: 29 %
Platelets: 63 10*3/uL — ABNORMAL LOW (ref 150–400)
RBC: 3.02 MIL/uL — ABNORMAL LOW (ref 4.22–5.81)
RDW: 16.4 % — ABNORMAL HIGH (ref 11.5–15.5)
Smear Review: NORMAL
WBC: 1.8 10*3/uL — ABNORMAL LOW (ref 4.0–10.5)
nRBC: 3.8 % — ABNORMAL HIGH (ref 0.0–0.2)

## 2023-02-10 LAB — URINE CULTURE: Culture: NO GROWTH

## 2023-02-10 LAB — PROTIME-INR
INR: 2.1 — ABNORMAL HIGH (ref 0.8–1.2)
Prothrombin Time: 23.7 s — ABNORMAL HIGH (ref 11.4–15.2)

## 2023-02-10 LAB — C DIFFICILE QUICK SCREEN W PCR REFLEX
C Diff antigen: POSITIVE — AB
C Diff interpretation: DETECTED
C Diff toxin: POSITIVE — AB

## 2023-02-10 LAB — PATHOLOGIST SMEAR REVIEW: Path Review: REACTIVE

## 2023-02-10 LAB — MAGNESIUM: Magnesium: 1.8 mg/dL (ref 1.7–2.4)

## 2023-02-10 LAB — VITAMIN B12: Vitamin B-12: 226 pg/mL (ref 180–914)

## 2023-02-10 LAB — FOLATE: Folate: 8.1 ng/mL (ref >5.9)

## 2023-02-10 SURGERY — EGD (ESOPHAGOGASTRODUODENOSCOPY)
Anesthesia: Monitor Anesthesia Care

## 2023-02-10 MED ORDER — VANCOMYCIN HCL 125 MG PO CAPS
125.0000 mg | ORAL_CAPSULE | Freq: Four times a day (QID) | ORAL | Status: DC
  Administered 2023-02-10 – 2023-02-13 (×12): 125 mg via ORAL
  Filled 2023-02-10 (×17): qty 1

## 2023-02-10 MED ORDER — PROPOFOL 10 MG/ML IV BOLUS
INTRAVENOUS | Status: DC | PRN
  Administered 2023-02-10 (×2): 150 mg via INTRAVENOUS

## 2023-02-10 MED ORDER — VITAMIN B-12 1000 MCG PO TABS
1000.0000 ug | ORAL_TABLET | Freq: Every day | ORAL | Status: DC
  Administered 2023-02-11 – 2023-02-13 (×3): 1000 ug via ORAL
  Filled 2023-02-10 (×3): qty 1

## 2023-02-10 MED ORDER — SODIUM CHLORIDE 0.9 % IV SOLN
1.0000 g | Freq: Once | INTRAVENOUS | Status: DC
  Administered 2023-02-10: 1 g via INTRAVENOUS
  Filled 2023-02-10: qty 10

## 2023-02-10 MED ORDER — GADOBUTROL 1 MMOL/ML IV SOLN
8.0000 mL | Freq: Once | INTRAVENOUS | Status: AC | PRN
  Administered 2023-02-10: 8 mL via INTRAVENOUS

## 2023-02-10 MED ORDER — CYANOCOBALAMIN 1000 MCG/ML IJ SOLN
1000.0000 ug | Freq: Once | INTRAMUSCULAR | Status: AC
  Administered 2023-02-11: 1000 ug via INTRAMUSCULAR
  Filled 2023-02-10: qty 1

## 2023-02-10 MED ORDER — SODIUM CHLORIDE 0.9 % IV SOLN: 2.0000 g | INTRAVENOUS | Status: DC

## 2023-02-10 NOTE — Progress Notes (Signed)
   02/10/23 9629  Provider Notification  Provider Name/Title Dr. Threasa Beards and Dr, Jena Gauss  Date Provider Notified 02/10/23  Time Provider Notified [phone]  Method of Notification Page, face-to face  Notification Reason Critical Result  Test performed and critical result C-diff positive  Date Critical Result Received 02/10/23  Time Critical Result Received 0626  Provider response Evaluate remotely;See new orders;At bedside  Date of Provider Response 02/10/23  Time of Provider Response 0626   Filiberto Pinks,  RN

## 2023-02-10 NOTE — Plan of Care (Signed)
FMTS Interim Progress Note  S: Patient seen at bedside. Still having abdominal pain, though remains improved following fluid removal. Tolerated EGD procedure well today.   O: BP 107/70 (BP Location: Left Arm)   Pulse (!) 104   Temp 99.1 F (37.3 C) (Oral)   Resp 19   Ht 5\' 9"  (1.753 m)   Wt 79.1 kg   SpO2 94%   BMI 25.75 kg/m    General: No acute distress. Resting comfortably.  CV: Normal S1/S2. No extra heart sounds. Warm and well-perfused. Pulm: Breathing comfortably on room air. CTAB. No increased WOB. Abd: BS present. Improving distention. Diffusely tender.  Skin:  Warm, dry. Psych: Pleasant and appropriate.   A/P: EGD with large esophageal varices (banded), portal hypertensive gastropathy, duodenitis. Patient tolerated procedure well, remains stable at this time. Continue treatment plan as indicated in FMTS Daily Progress Note.   Ivery Quale, MD 02/10/2023, 9:39 PM PGY-1, [facility] Family Medicine Service pager [phone]

## 2023-02-10 NOTE — Consult Note (Addendum)
[facility]  [phone] [phone]    HEMATOLOGY CONSULTATION  PURPOSE OF CONSULTATION/CHIEF COMPLAINT: Pancytopenia  Referring MD: Dr. Lum Babe   HPI: This is a 48 year old male patient who was admitted on 02/09/2023 with complaints of profuse rectal bleeding 2-3 episodes per day with loose stools since last week Saturday, 6 days ago.  Patient also complained of one episode of vomiting, lightheadedness, fatigue, increased abdominal distention and poor appetite.  States he has had no further rectal bleeding since yesterday. Workup was done in the ED and patient was found to be pancytopenic.  Therefore hematology consult was requested. Patient was seen and assessed today.  He is awake alert and ambulatory.  Status post colonoscopy done this afternoon by GI.  Patient very talkative gives details of his medical history, appears older than stated age.  Denies chest pain, dizziness, current nausea vomiting.  Admits to ongoing diarrhea. Medical history significant for cirrhosis with ascites, and hypertension.  States he has had cirrhosis with abdominal distention for "several years" and follows with [facility]. Surgical history includes for hernia surgeries per patient.  Also reports multiple paracentesis with multiple EGDs. Family history is noncontributory. Social history significant for alcohol use from his early 30s to early 53s reports sixpack beer per day during that.,  Quit in November 2021.  Denies tobacco use.  Denies illicit or recreational drug use.  ASSESSMENT AND PLAN:  1.  Pancytopenia: Leukopenia/anemia/thrombocytopenia -CBC done today 02/10/2023. - Likely due to liver dysfunction and chronic disease - WBC low 1.8 today with ANC low 0.5, appears to be chronic with WBC in the 2 range 4 months ago. - Hemoglobin 10.3 today.  No transfusional requirements at this time.  Transfuse PRBC for Hgb <7.0. - Platelets low 63K today.  Platelets low 61K 4 months  ago.  No transfusional requirements at this time.  Transfuse platelets for counts <20K or <50 K with active bleeding. - Vitamin B12 level suboptimal at 226, may benefit from vitamin B12 supplementation.  Folate level 8.1 suboptimal, may benefit from folic acid supplementation. - Monitor CBC with differential closely.  2.  GI bleeding/rectal bleeding - Per patient started 6 days ago with profuse episodes of bleeding 2-3 times per day.  None since yesterday. - FOBT was positive - Upper endoscopy done today to rule out variceal bleed, due to presence of large esophageal varices, varical banding was performed. - Patient has had esophageal varices in the past that was treated with banding in February 2024. - On PPI and antibiotics, continue as ordered - GI following  3.  Alcoholic cirrhosis with Ascites - Long standing history of cirrhosis; likely due to to alcohol use. - Multiple paracentesis in the past for ascites. - Status post paracentesis on 02/10/2023.  2.5 mL of peritoneal fluid removed.  Cytology is pending. - Total bili elevated 8.3, significant increase from a year ago when he was 1.9. - Monitor closely during hospitalization and follow-up with [facility] upon discharge  4.  Hepatic encephalopathy - On lactulose, continue as ordered  5.  Hypertension -Monitor BP closely - Continue antihypertensives as ordered  6.  Diarrhea/C. Difficile+ - Positive for C. Difficile - Continue antibiotics as ordered - On enteric precautions   Past Medical History:  Diagnosis Date   Arthritis    Blood transfusion without reported diagnosis    recevied 4 units PRBC and FFP during hospitalization from 1/17-1/22/21   Cirrhosis (HCC)    Hypertension    PONV (postoperative nausea and vomiting)  Tears of meniscus and ACL of left knee 03/19/2018  :  Past Surgical History:  Procedure Laterality Date   ARTHROSCOPIC REPAIR ACL     x2   BIOPSY  10/05/2018   Procedure: BIOPSY;  Surgeon: Bernette Redbird, MD;  Location: [facility] ENDOSCOPY;  Service: Endoscopy;;   BIOPSY  03/07/2022   Procedure: BIOPSY;  Surgeon: Iva Boop, MD;  Location: [facility]OSCOPY;  Service: Gastroenterology;;   ESOPHAGEAL BANDING  02/12/2019   Procedure: ESOPHAGEAL BANDING;  Surgeon: Kathi Der, MD;  Location: [facility] ENDOSCOPY;  Service: Gastroenterology;;   ESOPHAGEAL BANDING  12/15/2019   Procedure: ESOPHAGEAL BANDING;  Surgeon: Vida Rigger, MD;  Location: [facility] ENDOSCOPY;  Service: Endoscopy;;   ESOPHAGEAL BANDING  03/07/2022   Procedure: ESOPHAGEAL BANDING;  Surgeon: Iva Boop, MD;  Location: [facility] ENDOSCOPY;  Service: Gastroenterology;;   ESOPHAGOGASTRODUODENOSCOPY (EGD) WITH PROPOFOL N/A 10/05/2018   Procedure: ESOPHAGOGASTRODUODENOSCOPY (EGD) WITH PROPOFOL;  Surgeon: Bernette Redbird, MD;  Location: [facility] ENDOSCOPY;  Service: Endoscopy;  Laterality: N/A;   ESOPHAGOGASTRODUODENOSCOPY (EGD) WITH PROPOFOL N/A 02/12/2019   Procedure: ESOPHAGOGASTRODUODENOSCOPY (EGD) WITH PROPOFOL;  Surgeon: Kathi Der, MD;  Location: [facility] ENDOSCOPY;  Service: Gastroenterology;  Laterality: N/A;   ESOPHAGOGASTRODUODENOSCOPY (EGD) WITH PROPOFOL N/A 12/15/2019   Procedure: ESOPHAGOGASTRODUODENOSCOPY (EGD) WITH PROPOFOL;  Surgeon: Vida Rigger, MD;  Location: [facility] ENDOSCOPY;  Service: Endoscopy;  Laterality: N/A;   ESOPHAGOGASTRODUODENOSCOPY (EGD) WITH PROPOFOL N/A 10/13/2020   Procedure: ESOPHAGOGASTRODUODENOSCOPY (EGD) WITH PROPOFOL;  Surgeon: Iva Boop, MD;  Location: [facility] ENDOSCOPY;  Service: Endoscopy;  Laterality: N/A;   ESOPHAGOGASTRODUODENOSCOPY (EGD) WITH PROPOFOL N/A 03/07/2022   Procedure: ESOPHAGOGASTRODUODENOSCOPY (EGD) WITH PROPOFOL;  Surgeon: Iva Boop, MD;  Location: [facility] ENDOSCOPY;  Service: Gastroenterology;  Laterality: N/A;   HERNIA REPAIR     x3   IR PARACENTESIS  12/20/2019   IR PARACENTESIS  12/25/2019   IR PARACENTESIS  01/07/2020   IR PARACENTESIS  01/10/2020   IR PARACENTESIS  01/15/2020   IR  PARACENTESIS  01/21/2020   IR PARACENTESIS  01/29/2020   IR PARACENTESIS  02/18/2020   IR PARACENTESIS  02/09/2023   KNEE ARTHROSCOPY WITH ANTERIOR CRUCIATE LIGAMENT (ACL) REPAIR WITH HAMSTRING GRAFT Left 03/20/2018   Procedure: KNEE ARTHROSCOPY WITH REVESION ANTERIOR CRUCIATE LIGAMENT (ACL) REPAIR WITH  HAMSTRING ALLOGRAFT,;  Surgeon: Salvatore Marvel, MD;  Location: [facility];  Service: Orthopedics;  Laterality: Left;   KNEE ARTHROSCOPY WITH LATERAL MENISECTOMY Left 03/20/2018   Procedure: KNEE ARTHROSCOPY WITH LATERAL MENISECTOMY;  Surgeon: Salvatore Marvel, MD;  Location: [facility];  Service: Orthopedics;  Laterality: Left;   KNEE ARTHROSCOPY WITH MEDIAL MENISECTOMY Left 03/20/2018   Procedure: KNEE ARTHROSCOPY WITH MEDIAL MENISECTOMY;  Surgeon: Salvatore Marvel, MD;  Location: [facility];  Service: Orthopedics;  Laterality: Left;  :  Allergies  Allergen Reactions   Spironolactone Other (See Comments)    Gynecomastia    Tramadol     Made him feel hot and shakey  :   Family History  Problem Relation Age of Onset   Osteoarthritis Mother    Coronary artery disease Father    Hyperlipidemia Father   :   Social History   Socioeconomic History   Marital status: Divorced    Spouse name: Not on file   Number of children: Not on file   Years of education: Not on file   Highest education level: Not on file  Occupational History   Occupation: Actuary with Sports Medicine    Employer: [employer]  Tobacco Use   Smoking status: Never   Smokeless tobacco: Never  Vaping Use   Vaping status: Never Used  Substance and Sexual Activity   Alcohol use: Yes    Alcohol/week: 42.0 standard drinks of alcohol    Types: 42 Cans of beer per week   Drug use: No   Sexual activity: Not on file  Other Topics Concern   Not on file  Social History Narrative   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 [place]    He previously taught elementary school math for 16 years   He lives in [place] with his wife and 2 daughters born 2007 and 2010 - divorced now   Former heavy alcohol user none in 2022 since late 2021   1 caffeinated beverage daily   Never smoker no other tobacco or drug use   Social Drivers of Corporate investment banker Strain: Not on file  Food Insecurity: No Food Insecurity (02/08/2023)   Hunger Vital Sign    Worried About Running Out of Food in the Last Year: Never true    Ran Out of Food in the Last Year: Never true  Transportation Needs: No Transportation Needs (02/08/2023)   PRAPARE - Administrator, Civil Service (Medical): No    Lack of Transportation (Non-Medical): No  Physical Activity: Not on file  Stress: Not on file  Social Connections: Socially Isolated (02/08/2023)   Social Connection and Isolation Panel [NHANES]    Frequency of Communication with Friends and Family: Three times a week    Frequency of Social Gatherings with Friends and Family: Once a week    Attends Religious Services: Never    Database administrator or Organizations: No    Attends Banker Meetings: Never    Marital Status: Divorced  Catering manager Violence: Not At Risk (02/08/2023)   Humiliation, Afraid, Rape, and Kick questionnaire    Fear of Current or Ex-Partner: No    Emotionally Abused: No    Physically Abused: No    Sexually Abused: No  :   CURRENT MEDS: Current Facility-Administered Medications  Medication Dose Route Frequency Provider Last Rate Last Admin   0.9 %  sodium chloride infusion   Intravenous Continuous Imogene Burn, MD 20 mL/hr at 02/10/23 0633 Restarted at 02/10/23 0633   [MAR Hold] acamprosate (CAMPRAL) tablet 333 mg  333 mg Oral TID Ivery Quale, MD   333 mg at 02/10/23 0930   [MAR Hold] acetaminophen (TYLENOL) tablet 500 mg  500 mg Oral Q6H PRN Ivery Quale, MD   500 mg at 02/10/23 0520   [MAR Hold] lactulose (CHRONULAC) 10 GM/15ML solution 10  g  10 g Oral TID Ivery Quale, MD   10 g at 02/09/23 1600   [MAR Hold] ondansetron (ZOFRAN) tablet 4 mg  4 mg Oral Q8H PRN Ivery Quale, MD       [facility] Hold] oxyCODONE (Oxy IR/ROXICODONE) immediate release tablet 15 mg  15 mg Oral BID PRN Ivery Quale, MD   15 mg at 02/09/23 2110   [MAR Hold] pantoprazole (PROTONIX) injection 40 mg  40 mg Intravenous BID Erick Alley, DO   40 mg at 02/10/23 0930   [MAR Hold] vancomycin (VANCOCIN) capsule 125 mg  125 mg Oral QID Alfredo Martinez, MD   125 mg at 02/10/23 6387    REVIEW OF SYSTEMS:   Constitutional: +Fatigue, denies fevers, chills or abnormal night sweats Eyes: Denies blurriness of vision, double  vision or watery eyes Ears, nose, mouth, throat, and face: Denies mucositis or sore throat Respiratory: Denies cough, dyspnea or wheezes Cardiovascular: Denies palpitation, chest discomfort or lower extremity swelling Gastrointestinal: +Abdominal swelling, +diarrhea with rectal bleeding Skin: Denies abnormal skin rashes Lymphatics: Denies new lymphadenopathy or easy bruising Neurological: Denies numbness, tingling or new weaknesses Behavioral/Psych: Mood is stable, no new changes  All other systems were reviewed with the patient and are negative.  PHYSICAL EXAMINATION: ECOG PERFORMANCE STATUS: 1 - Symptomatic but completely ambulatory  Vitals:   02/10/23 0930 02/10/23 1045  BP: 107/68 122/72  Pulse: (!) 103 95  Resp:  16  Temp: 99 F (37.2 C) 97.9 F (36.6 C)  SpO2: 94% 97%   Filed Weights   02/09/23 2335 02/10/23 0458 02/10/23 1045  Weight: 173 lb (78.5 kg) 174 lb 6.4 oz (79.1 kg) 174 lb 6.1 oz (79.1 kg)    GENERAL: alert, no distress and comfortable +ill appearing  SKIN: +Jaundiced skin color, texture, turgor are normal, no rashes or significant lesions EYES: +Scleral icterus OROPHARYNX: no exudate, no erythema and lips, buccal mucosa, and tongue normal  NECK: supple, thyroid normal size, non-tender, without nodularity LYMPH: no palpable  lymphadenopathy in the cervical, axillary or inguinal LUNGS: clear to auscultation and percussion with normal breathing effort HEART: regular rate & rhythm and no murmurs and no lower extremity edema ABDOMEN: +Abdominal distention  MUSCULOSKELETAL: no cyanosis of digits and no clubbing  PSYCH: alert & oriented x 3 with fluent speech NEURO: no focal motor/sensory deficits   LABS: Lab Results  Component Value Date   WBC 1.8 (L) 02/10/2023   HGB 10.3 (L) 02/10/2023   HCT 29.2 (L) 02/10/2023   MCV 96.7 02/10/2023   PLT 63 (L) 02/10/2023    Lab Results  Component Value Date   WBC 1.8 (L) 02/10/2023   HGB 10.3 (L) 02/10/2023   HCT 29.2 (L) 02/10/2023   PLT 63 (L) 02/10/2023   GLUCOSE 101 (H) 02/10/2023   CHOL 123 10/12/2022   TRIG 76 10/12/2022   HDL 29 (L) 10/12/2022   LDLCALC 79 10/12/2022   ALT 22 02/10/2023   AST 36 02/10/2023   NA 132 (L) 02/10/2023   K 3.6 02/10/2023   CL 101 02/10/2023   CREATININE 0.68 02/10/2023   BUN 9 02/10/2023   CO2 24 02/10/2023   INR 2.1 (H) 02/10/2023   HGBA1C 5.0 12/15/2019    DG Chest 1 View Result Date: 02/10/2023 CLINICAL DATA:  Dyspnea, shortness of breath EXAM: CHEST  1 VIEW COMPARISON:  02/08/2023 FINDINGS: Lungs are clear. Heart size and mediastinal contours are within normal limits. No effusion. Visualized bones unremarkable. IMPRESSION: No acute cardiopulmonary disease. Electronically Signed   By: Corlis Leak M.D.   On: 02/10/2023 08:29   US LIVER DOPPLER Result Date: 02/10/2023 CLINICAL DATA:  Cirrhosis, atrophic nodular liver EXAM: DUPLEX ULTRASOUND OF LIVER TECHNIQUE: Color and duplex Doppler ultrasound was performed to evaluate the hepatic in-flow and out-flow vessels. COMPARISON:  CTA 02/08/2023 FINDINGS: Liver: Normal parenchymal echogenicity. Nodular hepatic contour. No focal lesion, mass or intrahepatic biliary ductal dilatation. Main Portal Vein size: 1.1 cm Portal Vein Velocities (all hepatopetal): Main :  22-28 cm/sec.   Portosplenic confluence not visualized. Right: 28 cm/sec Left: 27 cm/sec Hepatic Vein Velocities (all hepatofugal): Right:  18 cm/sec Middle:  49 cm/sec Left:  24 cm/sec IVC: Present and patent with normal respiratory phasicity. Hepatic Artery Velocity:  108 cm/sec Splenic Vein Velocity:  32 cm/sec Spleen: 14 cm x  17.6 cm x 6.5 cm with a total volume of 384 cm^3 (411 cm^3 is upper limit normal) Portal Vein Occlusion/Thrombus: None visualized, although the Portosplenic confluence and region of concern seen on prior CT is obscured by overlying bowel gas. Splenic Vein Occlusion/Thrombus: Obscured by overlying bowel gas Ascites: Small volume Varices: None identified IMPRESSION: 1. Cirrhosis with small volume ascites. 2. The SMV and portosplenic confluence is obscured by overlying bowel gas, limiting assessment for the possible venous thrombosis seen on recent CTA. Abdomen MRI/MRV may provide more definitive characterization if clinically indicated. 3. Splenomegaly. Electronically Signed   By: Corlis Leak M.D.   On: 02/10/2023 08:18   IR Paracentesis Result Date: 02/09/2023 INDICATION: Patient with a history of cirrhosis presents today with ascites. Interventional radiology asked to perform a diagnostic and therapeutic paracentesis. EXAM: ULTRASOUND GUIDED PARACENTESIS MEDICATIONS: 1% lidocaine 10 mL COMPLICATIONS: None immediate. PROCEDURE: Informed written consent was obtained from the patient after a discussion of the risks, benefits and alternatives to treatment. A timeout was performed prior to the initiation of the procedure. Initial ultrasound scanning demonstrates a large amount of ascites within the right lower abdominal quadrant. The right lower abdomen was prepped and draped in the usual sterile fashion. 1% lidocaine was used for local anesthesia. Following this, a 19 gauge, 7-cm, Yueh catheter was introduced. An ultrasound image was saved for documentation purposes. The paracentesis was performed. The  catheter was removed and a dressing was applied. The patient tolerated the procedure well without immediate post procedural complication. Patient received post-procedure intravenous albumin; see nursing notes for details. FINDINGS: A total of approximately 2.5 L of clear yellow fluid was removed. Samples were sent to the laboratory as requested by the clinical team. IMPRESSION: Successful ultrasound-guided paracentesis yielding 2.5 liters of peritoneal fluid. Procedure performed by Alwyn Ren NP PLAN: The patient has previously been evaluated by the [facility] Portal Hypertension [facility], and deemed not a candidate for intervention. Electronically Signed   By: Marliss Coots M.D.   On: 02/09/2023 15:09   CT Angio Abd/Pel W and/or Wo Contrast Result Date: 02/08/2023 CLINICAL DATA:  Lower GI bleed.  History of cirrhosis. EXAM: CTA ABDOMEN AND PELVIS WITHOUT AND WITH CONTRAST TECHNIQUE: Multidetector CT imaging of the abdomen and pelvis was performed using the standard protocol during bolus administration of intravenous contrast. Multiplanar reconstructed images and MIPs were obtained and reviewed to evaluate the vascular anatomy. RADIATION DOSE REDUCTION: This exam was performed according to the departmental dose-optimization program which includes automated exposure control, adjustment of the mA and/or kV according to patient size and/or use of iterative reconstruction technique. CONTRAST:  OMNIPAQUE IOHEXOL 350 MG/ML SOLN COMPARISON:  CT 10/11/2022 FINDINGS: VASCULAR Aorta: Normal caliber aorta without aneurysm, dissection, vasculitis or significant stenosis. Celiac: Patent without evidence of aneurysm, dissection, vasculitis or significant stenosis. SMA: Patent without evidence of aneurysm, dissection, vasculitis or significant stenosis. Renals: Both renal arteries are patent without evidence of aneurysm, dissection, vasculitis, fibromuscular dysplasia or significant stenosis.  IMA: Patent without evidence of aneurysm, dissection, vasculitis or significant stenosis. Inflow: Patent without evidence of aneurysm, dissection, vasculitis or significant stenosis. Mild atherosclerosis. Proximal Outflow: Bilateral common femoral and visualized portions of the superficial and profunda femoral arteries are patent without evidence of aneurysm, dissection, vasculitis or significant stenosis. Veins: Portal vein is poorly assessed on the current exam. There may be low-density within the extrahepatic portal vein, series 309, image 35, but no discrete filling defects seen. Missed ill-defined area of low-density extends to the upper  aspect of the SMV, series 309 image 46. Iliac veins and IVC are patent. There are multiple portosystemic collaterals. Review of the MIP images confirms the above findings. NON-VASCULAR Lower chest: Calcified mediastinal and right hilar lymph nodes consistent with prior granulomatous disease. No pleural fluid. [place] off a gel varices. Hepatobiliary: Cirrhosis with nodular hepatic contours. Ill-defined area of low-density in the anterior right lobe. There may be a small exophytic nodule from the anterior inferior right hepatic lobe series 309 image 43. multiple gallstones. No biliary dilatation. Pancreas: No ductal dilatation or evidence of mass. No specific peripancreatic inflammation. Spleen: Splenomegaly, 19.2 cm AP. Adrenals/Urinary Tract: No adrenal nodule. No hydronephrosis or renal calculi. No focal renal abnormality. Nondistended urinary bladder. Stomach/Bowel: There is no contrast extravasation in the GI tract to localize site of GI bleed. There esophageal varices. Nondistended stomach. No small bowel obstruction. There is circumferential rectal wall thickening. Wall thickening about cecum and ascending colon. Lymphatic: Enlarged portal caval and porta hepatis nodes, for example 12 mm series 303, image 71. There are multiple prominent central mesenteric nodes, 11 mm  series 303, image 96. Reproductive: Prostate is unremarkable. Other: Moderate abdominopelvic ascites. Generalized mesenteric edema. Prior umbilical hernia repair. Musculoskeletal: Chronic bilateral L5 pars interarticularis defects. There are no acute or suspicious osseous abnormalities. IMPRESSION: VASCULAR 1. No extravasation of contrast into the GI tract to localize site of GI bleed. 2. Potential thrombus in the extrahepatic portal vein and upper SMV, ill-defined low-density but no discrete filling defect. Recommend ultrasound for further assessment. 3. Portal hypertension. NON-VASCULAR 1. Cirrhosis with portal hypertension and moderate ascites. Potential exophytic nodule from the right lobe of the liver. 2. Gallstones. 3. Wall thickening of the rectum as well as ascending colon may be due to portal colopathy. Colitis could have a similar appearance. Electronically Signed   By: Narda Rutherford M.D.   On: 02/08/2023 18:04   DG Chest Portable 1 View Result Date: 02/08/2023 CLINICAL DATA:  Bloody stool.  Palpitations.  History of cirrhosis EXAM: PORTABLE CHEST 1 VIEW COMPARISON:  None Available. FINDINGS: Normal mediastinum and cardiac silhouette. Normal pulmonary vasculature. No evidence of effusion, infiltrate, or pneumothorax. No acute bony abnormality. IMPRESSION: No acute cardiopulmonary process. Electronically Signed   By: Genevive Bi M.D.   On: 02/08/2023 17:40     The total time spent in the appointment was 55 minutes encounter with patients including review of chart and various tests results, discussions about plan of care and coordination of care plan   All questions were answered. The patient knows to call the clinic with any problems, questions or concerns. No barriers to learning was detected.  Thank you for the courtesy of this consultation, Dawson Bills, NP  1/17/202511:36 AM  ADDENDUM I have seen the patient, examined him. I agree with the assessment and and plan and have  edited the notes.   48 yo male with alcohol-related liver cirrhosis, portal hypertension with varices, ascites, hepatic encephalopathy, presented with rectal bleeding and abdominal distention, found to have C. difficile colitis.  He underwent EGD this morning which showed large varices, banded.  He also had paracentesis with 2.5 L fluid removed yesterday.  Culture from ascites has been negative so far.  We are consulted for pancytopenia, which is likely related to his liver cirrhosis and splenomegaly.  He has quit alcohol since November 2021, and was seen by transplant team.  B12 level is on the low end of normal, which may partially contribute to his pancytopenia.  I will  recommend 1 dose of B12 injection and start oral B12 1000 mcg supplement daily.  Given his GI bleeding from varices, I suspect he probably has iron deficient anemia also.  Will check a ferritin and iron studies, if low please give oral or IV iron for severe iron deficiency.  Continue to use splenomegaly, will be cautious about excessive due to risk of splenic rupture, but may consider G-CSF if he has persistent fever or C. difficile did not respond to antibiotics.  I do not have high suspicion for primary bone marrow disease such as MDS, but would consider a bone marrow biopsy if he is listed for liver transplant (per pt he is not). HBV, HCV and HIV were all negative in 2021.  His CT chest, abdomen pelvis with contrast a few days ago was negative for adenopathy or malignancy, except potential liver nodule, and his liver MRI from today report is still pending. Will check AFP.  I will follow-up the above lab and image results over the weekend, and follow-up next week if needed. Please call me when he is ready to be discharged and I will schedule his f/u with me in office.  I spent a total of 45 minutes for his visit today, more than 50% time on face-to-face counseling.  Malachy Mood MD 02/10/2023

## 2023-02-10 NOTE — Interval H&P Note (Signed)
History and Physical Interval Note:  02/10/2023 11:26 AM  Joshua Hubbard  has presented today for surgery, with the diagnosis of Hematochezia, cirrhosis, history of esophageal varices.  The various methods of treatment have been discussed with the patient and family. After consideration of risks, benefits and other options for treatment, the patient has consented to  Procedure(s): ESOPHAGOGASTRODUODENOSCOPY (EGD) (N/A) as a surgical intervention.  The patient's history has been reviewed, patient examined, no change in status, stable for surgery.  I have reviewed the patient's chart and labs.  Questions were answered to the patient's satisfaction.     Imogene Burn

## 2023-02-10 NOTE — Progress Notes (Signed)
   02/10/23 0502  Vitals  Temp (!) 101.1 F (38.4 C)  Temp Source Oral  BP 111/71  MAP (mmHg) 85  BP Location Left Arm  BP Method Automatic  Patient Position (if appropriate) Lying  Pulse Rate (!) 109  Pulse Rate Source Monitor  ECG Heart Rate (!) 108  Resp 19  Level of Consciousness  Level of Consciousness Alert  MEWS COLOR  MEWS Score Color Yellow  Oxygen Therapy  SpO2 91 %  O2 Device Room Air  Patient Activity (if Appropriate) In bed  Pulse Oximetry Type Intermittent   Tylenol was given. Dr. Jena Gauss and Dr. Threasa Beards made aware. New orders received for Ceftriaxone 1 gm one extra dose. We will continue to monitor.  Filiberto Pinks, RN

## 2023-02-10 NOTE — Telephone Encounter (Signed)
Patient Product/process development scientist completed.    The patient is insured through [facility]. Patient has [employer], may use a copay card, and/or apply for patient assistance if available.    Ran test claim for vancomycin 125 mg caps and the current 10 day co-pay is $0.00.   This test claim was processed through [facility]- copay amounts may vary at other pharmacies due to pharmacy/plan contracts, or as the patient moves through the different stages of their insurance plan.     Roland Earl, CPHT Pharmacy Technician III Certified Patient Advocate [facility] Patient Advocate Team Direct Number: [phone]  Fax: [phone]

## 2023-02-10 NOTE — TOC Initial Note (Signed)
Transition of Care [facility]) - Initial/Assessment Note    Patient Details  Name: Joshua Hubbard MRN: [id number] Date of Birth: 1975-06-17  Transition of Care [facility]) CM/SW Contact:    Leone Haven, RN Phone Number: 02/10/2023, 4:24 PM  Clinical Narrative:                 From home with daughters, has PCP and insurance on file, states has no HH services in place at this time or DME at home.  States daughter will transport them home at [employer] and family is support system, [facility] [address]  Pta self ambulatory .  Expected Discharge Plan: Home/Self Care Barriers to Discharge: Continued Medical Work up   Patient Goals and CMS Choice Patient states their goals for this hospitalization and ongoing recovery are:: return home with daughters   Choice offered to / list presented to : NA      Expected Discharge Plan and Services In-house Referral: NA Discharge Planning Services: CM Consult Post Acute Care Choice: NA Living arrangements for the past 2 months: Single Family Home                   DME Agency: NA       HH Arranged: NA          Prior Living Arrangements/Services Living arrangements for the past 2 months: Single Family Home Lives with:: Adult Children Patient language and need for interpreter reviewed:: Yes Do you feel safe going back to the place where you live?: Yes      Need for Family Participation in Patient Care: Yes (Comment) Care giver support system in place?: Yes (comment)   Criminal Activity/Legal Involvement Pertinent to Current Situation/Hospitalization: No - Comment as needed  Activities of Daily Living   ADL Screening (condition at time of admission) Independently performs ADLs?: Yes (appropriate for developmental age) Is the patient deaf or have difficulty hearing?: No Does the patient have difficulty seeing, even when wearing glasses/contacts?: No Does the patient have difficulty concentrating, remembering, or  making decisions?: No  Permission Sought/Granted Permission sought to share information with : Case Manager Permission granted to share information with : Yes, Verbal Permission Granted              Emotional Assessment Appearance:: Appears stated age Attitude/Demeanor/Rapport: Engaged Affect (typically observed): Appropriate Orientation: : Oriented to Self, Oriented to Place, Oriented to  Time, Oriented to Situation   Psych Involvement: No (comment)  Admission diagnosis:  Hypokalemia [E87.6] Hypomagnesemia [E83.42] Rectal bleeding [K62.5] Acute GI bleeding [K92.2] Alcoholic cirrhosis of liver with ascites (HCC) [K70.31] Pancytopenia (HCC) [D61.818] GI bleed [K92.2] Patient Active Problem List   Diagnosis Date Noted   GI bleed 02/09/2023   Cirrhosis of liver with ascites c/f SBP 02/09/2023   Leukopenia 02/09/2023   Rectal bleeding 02/09/2023   Acute GI bleeding 02/08/2023   Gastric polyps 03/07/2022   Pain in joint of left shoulder 05/10/2021   Right rotator cuff tear 12/15/2020   Portal hypertensive gastropathy (HCC) 10/30/2020   Drug-induced gynecomastia 09/15/2020   Attention and concentration deficit 06/27/2020   Atrophy of muscle of multiple sites 02/07/2020   Ascites due to alcoholic cirrhosis (HCC)    Lesion of liver greater than 1 cm in diameter with liver disease conferring risk of hepatocellular carcinoma 10/08/2019   Psychosocial stressors 05/03/2019   Hx of  Upper GI bleed 02/11/2019   Alcohol abuse, in remission 10/08/2018   Liver cirrhosis (HCC) 10/05/2018  Thrombocytopenia (HCC) 10/05/2018   Tears of meniscus and ACL of left knee 03/19/2018   Tinnitus of both ears 02/01/2017   Hoarseness of voice 02/01/2017   Mixed conductive and sensorineural hearing loss of right ear with unrestricted hearing of left ear 07/25/2016   PCP:  Nestor Ramp, MD Pharmacy:   [facility] [address] [place] [facility] [address] [place]  [place] [address] Phone: [phone] Fax: [phone]  [place] [facility] [address] [place] [address] Phone: [phone] Fax: [phone]     Social Drivers of Health (SDOH) Social History: SDOH Screenings   Food Insecurity: No Food Insecurity (02/08/2023)  Housing: Low Risk  (02/08/2023)  Transportation Needs: No Transportation Needs (02/08/2023)  Utilities: Not At Risk (02/08/2023)  Depression (PHQ2-9): High Risk (01/11/2023)  Social Connections: Socially Isolated (02/08/2023)  Tobacco Use: Low Risk  (02/10/2023)   SDOH Interventions:     Readmission Risk Interventions     No data to display

## 2023-02-10 NOTE — Assessment & Plan Note (Addendum)
WBC 1.1>1.8. Neutrophils 0.3>0.5.  Contributing factors may include sequestration from splenomegaly. Blood smear in ED with decreased platelets and target cells. - Monitor with CBC w/ diff - Heme/onc on board, appreciate recommendations

## 2023-02-10 NOTE — Op Note (Signed)
[facility] Patient Name: Joshua Hubbard Procedure Date : 02/10/2023 MRN: [id number] Attending MD: Particia Lather , , [id number] Date of Birth: 09/17/1975 CSN: [id number] Age: 48 Admit Type: Inpatient Procedure:                Upper GI endoscopy Indications:              Hematochezia, Follow-up of esophageal varices Providers:                Madelyn Brunner" Jenne Campus RN, RN, Marja Kays, Technician Referring MD:             Hospitalist team Medicines:                Monitored Anesthesia Care Complications:            No immediate complications. Estimated Blood Loss:     Estimated blood loss was minimal. Procedure:                Pre-Anesthesia Assessment:                           - Prior to the procedure, a History and Physical                            was performed, and patient medications and                            allergies were reviewed. The patient's tolerance of                            previous anesthesia was also reviewed. The risks                            and benefits of the procedure and the sedation                            options and risks were discussed with the patient.                            All questions were answered, and informed consent                            was obtained. Prior Anticoagulants: The patient has                            taken no anticoagulant or antiplatelet agents. ASA                            Grade Assessment: III - A patient with severe                            systemic disease. After reviewing the risks and  benefits, the patient was deemed in satisfactory                            condition to undergo the procedure.                           After obtaining informed consent, the endoscope was                            passed under direct vision. Throughout the                            procedure, the patient's blood pressure,  pulse, and                            oxygen saturations were monitored continuously. The                            [id number]-[id number] ([id number]) Olympus endoscope was introduced                            through the mouth, and advanced to the second part                            of duodenum. The upper GI endoscopy was                            accomplished without difficulty. The patient                            tolerated the procedure well. Scope In: Scope Out: Findings:      Large (> 5 mm) varices were found in the distal esophagus. Seven bands       were successfully placed with complete eradication, resulting in       deflation of varices. There was no bleeding at the end of the procedure.      Severe portal hypertensive gastropathy that was friable and oozing was       found in the gastric body.      Localized inflammation characterized by congestion (edema), friability,       and erythema was found in the duodenal bulb. Impression:               - Large (> 5 mm) esophageal varices. Completely                            eradicated. Banded.                           - Portal hypertensive gastropathy.                           - Duodenitis.                           - No specimens collected. Recommendation:           - Return  patient to hospital ward for ongoing care.                           - Hematochezia is favored to be due to C dif                            colitis, though due to presence of large esophageal                            varices, varical banding was performed.                           - Check H pylori breath test                           - PPI BID for 8 weeks to help with PHG                           - CLD for the rest of today, then soft diet                            tomorrow for 3 days, then low sodium diet                           - Repeat upper endoscopy in 4 weeks for endoscopic                            band ligation.                           -  MRI/MRV ordered to better characterize SMV and                            portal vein.                           - The findings and recommendations were discussed                            with the patient. Procedure Code(s):        --- Professional ---                           [phone], Esophagogastroduodenoscopy, flexible,                            transoral; with band ligation of esophageal/gastric                            varices Diagnosis Code(s):        --- Professional ---                           I85.00, Esophageal varices without bleeding  K76.6, Portal hypertension                           K31.89, Other diseases of stomach and duodenum                           K29.80, Duodenitis without bleeding                           K92.1, Melena (includes Hematochezia) CPT copyright 2022 American Medical Association. All rights reserved. The codes documented in this report are preliminary and upon coder review may  be revised to meet current compliance requirements. Dr Particia Lather "Alan Ripper" Leonides Schanz,  02/10/2023 12:34:15 PM Number of Addenda: 0

## 2023-02-10 NOTE — Progress Notes (Signed)
Daily Progress Note Intern Pager: [phone]  Patient name: Joshua Hubbard Medical record number: [id number] Date of birth: [birth year] Age: 48 y.o. Gender: male  Primary Care Provider: Nestor Ramp, MD Consultants: GI, IR, Heme/Onc Code Status: Full Code  Pt Overview and Major Events to Date:  1/16: Admitted  Paracentesis 1/17: C diff positive  Assessment and Plan: RHONE WNOROWSKI is a 48 y.o. male with hx of cirrhosis, hepatic encephalopathy, AUD, HTN presenting with BRBPR and abdominal swelling likely d/t GIB and c/f SBP. Assessment & Plan Acute GI bleeding Patient presented with BRBPR, FOBT positive.  CT demonstrated potential thrombus in extrahepatic portal vein and upper SMV, and wall thickening of rectum and descending colon which is concerning for portal hypertensive colopathy.  Hemoglobin stable at 11.7>10.6 (baseline ~13). Last EGD 02/2022 demonstrated esophageal varices that were treated with banding.  - C diff positive - started PO Vanc 125 mcg QID - EGD today per GI to rule out variceal bleed - Protonix 40 mg BID - Currently NPO, awaiting GI eval and recs - Liver doppler ordered to assess SMV and portal vein - AM CBC, CMP, Mg - replete electrolytes as indicated - Holding anticoagulation currently per GI Cirrhosis of liver with ascites c/f SBP CT demonstrated moderate ascites.  Patient continues to have of severe abdominal pain primarily in the midline.  Afebrile with max temp of 100.3, which has improved. T. bili improved from 8.2>6.8 likely due to decompensation and possible cholestasis.  Lactic acid normal. No hx of SBP in the past but has numerous paracenteses.  - S/p ceftriaxone 1 g due to suspicion of SBP, but ruled out by negative peritoneal fluid - Blood cultures drawn prior to abx - Oxycodone 15 mg BID PRN for pain - Tylenol 500 mg q6 PRN for fever, could consider IV Tylenol if NPO - Continue IV Protonix 40 mg BID, Lactulose 10 mg TID, Acamprosate 333 TID -  Holding home Lasix and Eplerenone iso soft BPs - resume as indicated  - Holding home Propanolol 10 mg BID - AM CMP, Mg - replete electrolytes as needed   Leukopenia WBC 1.1>1.8. Neutrophils 0.3>0.5.  Contributing factors may include sequestration from splenomegaly. Blood smear in ED with decreased platelets and target cells. - Monitor with CBC w/ diff - Heme/onc on board, appreciate recommendations  Chronic and Stable Conditions: AUD - Pt denies alcohol use since 2021, continue acamprosate. CIWAs without ativan. Chronic pain - oxycodone 15 mg BID prn  FEN/GI: NPO PPx: SCDs Dispo: Pending clinical improvement  Subjective:  Patient is currently NPO and feels like his belly feels better than yesterday.  However he is continues to have abdominal pain from the his xiphoid process down to his umbilicus.  He reports of some urinary hesitancy last night but did not complain of that this morning.  Reports no hematochezia since last Saturday, but has vomited.  Reports no bloody diarrheal movement since yesterday, which was the first day without blood in his stool.  Objective: Temp:  [97.9 F (36.6 C)-101.1 F (38.4 C)] 101.1 F (38.4 C) (01/17 0502) Pulse Rate:  [87-109] 107 (01/17 0639) Resp:  [17-20] 20 (01/17 0639) BP: (111-130)/(57-78) 111/71 (01/17 0502) SpO2:  [91 %-96 %] 94 % (01/17 0639) Weight:  [78.5 kg-79.1 kg] 79.1 kg (01/17 0458) Physical Exam: General: Awake and alert in NAD. Cardiovascular: RRR.  No M/R/G. Respiratory: CTAB, normal WOB on RA.  No wheezing, crackles, rhonchi, or diminished breath sounds. Abdomen: Distended abdomen, improved  since yesterday.  Diffusely tender to palpation especially midline, but not as much in the periphery.  Central midline area of protrusion. Extremities: Able to move all extremities equally.  No BLE edema.  Laboratory: Most recent CBC Lab Results  Component Value Date   WBC 1.8 (L) 02/10/2023   HGB 10.3 (L) 02/10/2023   HCT 29.2 (L)  02/10/2023   MCV 96.7 02/10/2023   PLT 63 (L) 02/10/2023   Most recent BMP    Latest Ref Rng & Units 02/10/2023    2:44 AM  BMP  Glucose 70 - 99 mg/dL 132   BUN 6 - 20 mg/dL 9   Creatinine 4.40 - 1.02 mg/dL 7.25   Sodium 366 - 440 mmol/L 132   Potassium 3.5 - 5.1 mmol/L 3.6   Chloride 98 - 111 mmol/L 101   CO2 22 - 32 mmol/L 24   Calcium 8.9 - 10.3 mg/dL 7.8    C diff: Positive PT-INR: 23.7/2.1 Peritoneal fluid: LD 32, Hazy, yellow appearance, Neutrophil 8,   Blood and peritoneal cx: NGTD Vitamin B: 226 (wnl) Folate: 8.1 (wnl) Mag: 1.8  Imaging/Diagnostic Tests: IR Paracentesis: Successful ultrasound-guided paracentesis yielding 2.5 liters of peritoneal fluid  Fortunato Curling, DO 02/10/2023, 7:54 AM PGY-1, [facility]  FPTS Intern pager: [phone], text pages welcome Secure chat group [facility] Teaching Service

## 2023-02-10 NOTE — Transfer of Care (Signed)
Immediate Anesthesia Transfer of Care Note  Patient: Joshua Hubbard  Procedure(s) Performed: ESOPHAGOGASTRODUODENOSCOPY (EGD) ESOPHAGEAL BANDING  Patient Location: PACU  Anesthesia Type:MAC  Level of Consciousness: awake, alert , and oriented  Airway & Oxygen Therapy: Patient Spontanous Breathing  Post-op Assessment: Report given to RN and Post -op Vital signs reviewed and stable  Post vital signs: Reviewed and stable  Last Vitals:  Vitals Value Taken Time  BP    Temp    Pulse 97 02/10/23 1225  Resp 17 02/10/23 1225  SpO2 97 % 02/10/23 1225  Vitals shown include unfiled device data.  Last Pain:  Vitals:   02/10/23 1045  TempSrc: Temporal  PainSc: 0-No pain      Patients Stated Pain Goal: 0 (02/09/23 0330)  Complications: No notable events documented.

## 2023-02-10 NOTE — Progress Notes (Addendum)
Pt is alert, fully oriented x 4, forgetful, slightly anxious, CIWA score 0-2. Stable hemodynamically, afebrile, NSR/ ST on the monitor, HR 80-110, on room air, SPO2 93-96%, normal respiratory effort. He is able to ambulated independently, no difficulties. No obvious acute distress noted overnight. Generalized pain is well controlled with Oxycodone 15 mg BID. He is able to rest and sleep well tonight.   Plan of care is reviewed. Pt has been progressing. Plan for EGD at am. Pre procedure education is provided. Pt expressed his understanding. Informed consent signed by Pt. NPO after midnight.  We will continue to monitor.  Filiberto Pinks, RN

## 2023-02-10 NOTE — Assessment & Plan Note (Addendum)
CT demonstrated moderate ascites.  Patient continues to have of severe abdominal pain primarily in the midline.  Afebrile with max temp of 100.3, which has improved. T. bili improved from 8.2>6.8 likely due to decompensation and possible cholestasis.  Lactic acid normal. No hx of SBP in the past but has numerous paracenteses.  - S/p ceftriaxone 1 g due to suspicion of SBP, but ruled out by negative peritoneal fluid - Blood cultures drawn prior to abx - Oxycodone 15 mg BID PRN for pain - Tylenol 500 mg q6 PRN for fever, could consider IV Tylenol if NPO - Continue IV Protonix 40 mg BID, Lactulose 10 mg TID, Acamprosate 333 TID - Holding home Lasix and Eplerenone iso soft BPs - resume as indicated  - Holding home Propanolol 10 mg BID - AM CMP, Mg - replete electrolytes as needed

## 2023-02-10 NOTE — Assessment & Plan Note (Addendum)
Patient presented with BRBPR, FOBT positive.  CT demonstrated potential thrombus in extrahepatic portal vein and upper SMV, and wall thickening of rectum and descending colon which is concerning for portal hypertensive colopathy.  Hemoglobin stable at 11.7>10.6 (baseline ~13). Last EGD 02/2022 demonstrated esophageal varices that were treated with banding.  - C diff positive - started PO Vanc 125 mcg QID - EGD today per GI to rule out variceal bleed - Protonix 40 mg BID - Currently NPO, awaiting GI eval and recs - Liver doppler ordered to assess SMV and portal vein - AM CBC, CMP, Mg - replete electrolytes as indicated - Holding anticoagulation currently per GI

## 2023-02-10 NOTE — Anesthesia Preprocedure Evaluation (Signed)
Anesthesia Evaluation  Patient identified by MRN, date of birth, ID band Patient awake    Reviewed: Allergy & Precautions, NPO status , Patient's Chart, lab work & pertinent test results, reviewed documented beta blocker date and time   History of Anesthesia Complications (+) PONV and history of anesthetic complications  Airway Mallampati: II  TM Distance: >3 FB Neck ROM: Full    Dental  (+) Teeth Intact, Dental Advisory Given   Pulmonary neg pulmonary ROS   Pulmonary exam normal breath sounds clear to auscultation       Cardiovascular hypertension, Pt. on home beta blockers Normal cardiovascular exam Rhythm:Regular Rate:Normal     Neuro/Psych negative neurological ROS     GI/Hepatic ,GERD  Medicated,,(+) Cirrhosis   Esophageal Varices  substance abuse (Remote EtOH use)  Cirrhosis, history of esophageal varices  Hematochezia   Endo/Other  negative endocrine ROS    Renal/GU negative Renal ROS     Musculoskeletal  (+) Arthritis ,    Abdominal   Peds  Hematology  (+) Blood dyscrasia, anemia   Anesthesia Other Findings Day of surgery medications reviewed with the patient.  Reproductive/Obstetrics                              Anesthesia Physical Anesthesia Plan  ASA: 3  Anesthesia Plan: MAC   Post-op Pain Management:    Induction: Intravenous  PONV Risk Score and Plan: 2 and TIVA and Treatment may vary due to age or medical condition  Airway Management Planned: Natural Airway and Simple Face Mask  Additional Equipment:   Intra-op Plan:   Post-operative Plan:   Informed Consent: I have reviewed the patients History and Physical, chart, labs and discussed the procedure including the risks, benefits and alternatives for the proposed anesthesia with the patient or authorized representative who has indicated his/her understanding and acceptance.     Dental advisory  given  Plan Discussed with: CRNA and Anesthesiologist  Anesthesia Plan Comments:          Anesthesia Quick Evaluation

## 2023-02-10 NOTE — Interval H&P Note (Signed)
History and Physical Interval Note:  02/10/2023 11:25 AM  Joshua Hubbard  has presented today for surgery, with the diagnosis of Hematochezia, cirrhosis, history of esophageal varices.  The various methods of treatment have been discussed with the patient and family. After consideration of risks, benefits and other options for treatment, the patient has consented to  Procedure(s): ESOPHAGOGASTRODUODENOSCOPY (EGD) (N/A) as a surgical intervention.  The patient's history has been reviewed, patient examined, no change in status, stable for surgery.  I have reviewed the patient's chart and labs.  Questions were answered to the patient's satisfaction.     Imogene Burn

## 2023-02-11 DIAGNOSIS — K2981 Duodenitis with bleeding: Secondary | ICD-10-CM | POA: Diagnosis not present

## 2023-02-11 DIAGNOSIS — D61818 Other pancytopenia: Secondary | ICD-10-CM | POA: Diagnosis not present

## 2023-02-11 DIAGNOSIS — I8501 Esophageal varices with bleeding: Secondary | ICD-10-CM | POA: Diagnosis not present

## 2023-02-11 DIAGNOSIS — K7031 Alcoholic cirrhosis of liver with ascites: Secondary | ICD-10-CM | POA: Diagnosis not present

## 2023-02-11 DIAGNOSIS — K3189 Other diseases of stomach and duodenum: Secondary | ICD-10-CM | POA: Diagnosis not present

## 2023-02-11 DIAGNOSIS — K625 Hemorrhage of anus and rectum: Secondary | ICD-10-CM | POA: Diagnosis not present

## 2023-02-11 LAB — CBC WITH DIFFERENTIAL/PLATELET
Abs Immature Granulocytes: 0.12 10*3/uL — ABNORMAL HIGH (ref 0.00–0.07)
Basophils Absolute: 0 10*3/uL (ref 0.0–0.1)
Basophils Relative: 1 %
Eosinophils Absolute: 0.1 10*3/uL (ref 0.0–0.5)
Eosinophils Relative: 2 %
HCT: 32.2 % — ABNORMAL LOW (ref 39.0–52.0)
Hemoglobin: 11.6 g/dL — ABNORMAL LOW (ref 13.0–17.0)
Immature Granulocytes: 3 %
Lymphocytes Relative: 14 %
Lymphs Abs: 0.5 10*3/uL — ABNORMAL LOW (ref 0.7–4.0)
MCH: 34.3 pg — ABNORMAL HIGH (ref 26.0–34.0)
MCHC: 36 g/dL (ref 30.0–36.0)
MCV: 95.3 fL (ref 80.0–100.0)
Monocytes Absolute: 1.1 10*3/uL — ABNORMAL HIGH (ref 0.1–1.0)
Monocytes Relative: 28 %
Neutro Abs: 2 10*3/uL (ref 1.7–7.7)
Neutrophils Relative %: 52 %
Platelets: 98 10*3/uL — ABNORMAL LOW (ref 150–400)
RBC: 3.38 MIL/uL — ABNORMAL LOW (ref 4.22–5.81)
RDW: 17.4 % — ABNORMAL HIGH (ref 11.5–15.5)
WBC: 3.9 10*3/uL — ABNORMAL LOW (ref 4.0–10.5)
nRBC: 1.6 % — ABNORMAL HIGH (ref 0.0–0.2)

## 2023-02-11 LAB — COMPREHENSIVE METABOLIC PANEL
ALT: 25 U/L (ref 0–44)
AST: 43 U/L — ABNORMAL HIGH (ref 15–41)
Albumin: 2.7 g/dL — ABNORMAL LOW (ref 3.5–5.0)
Alkaline Phosphatase: 64 U/L (ref 38–126)
Anion gap: 8 (ref 5–15)
BUN: 8 mg/dL (ref 6–20)
CO2: 22 mmol/L (ref 22–32)
Calcium: 8 mg/dL — ABNORMAL LOW (ref 8.9–10.3)
Chloride: 99 mmol/L (ref 98–111)
Creatinine, Ser: 0.73 mg/dL (ref 0.61–1.24)
GFR, Estimated: >60 mL/min (ref >60)
Glucose, Bld: 115 mg/dL — ABNORMAL HIGH (ref 70–99)
Potassium: 3 mmol/L — ABNORMAL LOW (ref 3.5–5.1)
Sodium: 129 mmol/L — ABNORMAL LOW (ref 135–145)
Total Bilirubin: 8.1 mg/dL — ABNORMAL HIGH (ref 0.0–1.2)
Total Protein: 6.5 g/dL (ref 6.5–8.1)

## 2023-02-11 LAB — RETICULOCYTES
Immature Retic Fract: 53.7 % — ABNORMAL HIGH (ref 2.3–15.9)
RBC.: 3.44 MIL/uL — ABNORMAL LOW (ref 4.22–5.81)
Retic Count, Absolute: 117 10*3/uL (ref 19.0–186.0)
Retic Ct Pct: 3.4 % — ABNORMAL HIGH (ref 0.4–3.1)

## 2023-02-11 LAB — HEPARIN LEVEL (UNFRACTIONATED): Heparin Unfractionated: 0.22 [IU]/mL — ABNORMAL LOW (ref 0.30–0.70)

## 2023-02-11 LAB — IRON AND TIBC
Iron: 80 ug/dL (ref 45–182)
Saturation Ratios: 47 % — ABNORMAL HIGH (ref 17.9–39.5)
TIBC: 172 ug/dL — ABNORMAL LOW (ref 250–450)
UIBC: 92 ug/dL

## 2023-02-11 LAB — HEPATITIS PANEL, ACUTE
HCV Ab: NONREACTIVE
Hep A IgM: NONREACTIVE
Hep B C IgM: NONREACTIVE
Hepatitis B Surface Ag: NONREACTIVE

## 2023-02-11 LAB — FERRITIN: Ferritin: 375 ng/mL — ABNORMAL HIGH (ref 24–336)

## 2023-02-11 LAB — MAGNESIUM: Magnesium: 1.5 mg/dL — ABNORMAL LOW (ref 1.7–2.4)

## 2023-02-11 MED ORDER — AMILORIDE HCL 5 MG PO TABS
10.0000 mg | ORAL_TABLET | Freq: Every day | ORAL | Status: DC
  Administered 2023-02-11 – 2023-02-13 (×3): 10 mg via ORAL
  Filled 2023-02-11 (×3): qty 2

## 2023-02-11 MED ORDER — CEFTRIAXONE SODIUM 2 G IJ SOLR
2.0000 g | INTRAMUSCULAR | Status: DC
  Administered 2023-02-11 – 2023-02-13 (×3): 2 g via INTRAVENOUS
  Filled 2023-02-11 (×3): qty 20

## 2023-02-11 MED ORDER — HEPARIN BOLUS VIA INFUSION
5000.0000 [IU] | Freq: Once | INTRAVENOUS | Status: AC
  Administered 2023-02-11: 5000 [IU] via INTRAVENOUS
  Filled 2023-02-11: qty 5000

## 2023-02-11 MED ORDER — MAGNESIUM SULFATE 50 % IJ SOLN
1.0000 g | Freq: Once | INTRAMUSCULAR | Status: DC
  Filled 2023-02-11: qty 2

## 2023-02-11 MED ORDER — MAGNESIUM SULFATE 2 GM/50ML IV SOLN
2.0000 g | Freq: Once | INTRAVENOUS | Status: AC
  Administered 2023-02-11: 2 g via INTRAVENOUS
  Filled 2023-02-11: qty 50

## 2023-02-11 MED ORDER — HEPARIN (PORCINE) 25000 UT/250ML-% IV SOLN
1400.0000 [IU]/h | INTRAVENOUS | Status: DC
  Administered 2023-02-11: 1350 [IU]/h via INTRAVENOUS
  Filled 2023-02-11 (×2): qty 250

## 2023-02-11 MED ORDER — FUROSEMIDE 20 MG PO TABS
20.0000 mg | ORAL_TABLET | Freq: Every day | ORAL | Status: DC
Start: 2023-02-11 — End: 2023-02-13
  Administered 2023-02-11 – 2023-02-13 (×3): 20 mg via ORAL
  Filled 2023-02-11 (×3): qty 1

## 2023-02-11 MED ORDER — POTASSIUM CHLORIDE CRYS ER 20 MEQ PO TBCR
40.0000 meq | EXTENDED_RELEASE_TABLET | Freq: Four times a day (QID) | ORAL | Status: AC
  Administered 2023-02-11 (×2): 40 meq via ORAL
  Filled 2023-02-11 (×2): qty 2

## 2023-02-11 NOTE — Assessment & Plan Note (Addendum)
Resolving. Hgb stable 11.6. Likely 2/2 C.Diff colitis. S/P esophageal varice banding. MRV abdomen: Positive for subacute to chronic thrombus within the main portal vein extending into the proximal SMV. The right and left portal veins and intrahepatic portal veins are diminutive. Plan to touch base with GI today. -GI consulted, appreciate recommendations -Advance to soft diet today -PPI for 8 weeks -PO Vanc -Hold anticoagulation -AM CBC, CMP, Mg - replete electrolytes as indicated

## 2023-02-11 NOTE — Plan of Care (Signed)
Pt magnesium and potassium replaced as lab values slightly low

## 2023-02-11 NOTE — Assessment & Plan Note (Addendum)
WBC 3.9 today. Neutropenia transiently resolved this AM. Started B12 supplementation per Heme recs. - Heme consulted, appreciate recommendations -Transfuse PRBC for Hgb <7.0.  -Transfuse platelets for counts <20K or <50 K with active bleeding.

## 2023-02-11 NOTE — Assessment & Plan Note (Addendum)
SBP labs negative. Ascites remains improved s/p paracentesis. T. Bili remains elevated. Evidence of poral HTN. May benefit from SBP prophylaxis. Repeat Ammonia today, if elevated may need to increase lactulose. - GI consulted, appreciate recommendations - Oxycodone 15 mg BID PRN for pain - Tylenol 500 mg q6 PRN for fever, could consider IV Tylenol if NPO -f/u AFP - Check ammonia - Continue acamprosate  - Continue lactulose  - Restart Lasix 20 mg daily - Consider adding propanolol (HR elevated, but soft BP)

## 2023-02-11 NOTE — Progress Notes (Signed)
Gastroenterology Inpatient Follow Up    Subjective: Patient does have some abdominal discomfort.  Abdomen feels distended.  Denies blood in the stools.  He is passing stools effectively.  Objective: Vital signs in last 24 hours: Temp:  [97.6 F (36.4 C)-100.6 F (38.1 C)] 100.2 F (37.9 C) (01/18 1122) Pulse Rate:  [89-118] 118 (01/18 1122) Resp:  [15-21] 18 (01/18 1122) BP: (93-134)/(55-80) 115/59 (01/18 1122) SpO2:  [91 %-97 %] 97 % (01/18 1122) Weight:  [79.7 kg] 79.7 kg (01/18 0357) Last BM Date : 02/10/23  Intake/Output from previous day: 01/17 0701 - 01/18 0700 In: 560 [P.O.:360; I.V.:200] Out: -  Intake/Output this shift: Total I/O In: 240 [P.O.:240] Out: -   General appearance: alert and cooperative Resp: No increased work of breathing Cardio: Tachycardic GI: Distended, mildly tender to palpation Extremities: No BLE edema  Lab Results: Recent Labs    02/09/23 1020 02/10/23 0244 02/11/23 0235  WBC 1.2* 1.8* 3.9*  HGB 11.5* 10.3* 11.6*  HCT 32.2* 29.2* 32.2*  PLT 60* 63* 98*   BMET Recent Labs    02/09/23 0244 02/10/23 0244 02/11/23 0235  NA 129* 132* 129*  K 2.9* 3.6 3.0*  CL 100 101 99  CO2 22 24 22   GLUCOSE 108* 101* 115*  BUN 8 9 8   CREATININE 0.71 0.68 0.73  CALCIUM 7.1* 7.8* 8.0*   LFT Recent Labs    02/11/23 0235  PROT 6.5  ALBUMIN 2.7*  AST 43*  ALT 25  ALKPHOS 64  BILITOT 8.1*   PT/INR Recent Labs    02/08/23 1428 02/10/23 0244  LABPROT 24.1* 23.7*  INR 2.1* 2.1*   Hepatitis Panel Recent Labs    02/11/23 0758  HEPBSAG NON REACTIVE  HCVAB NON REACTIVE  HEPAIGM NON REACTIVE  HEPBIGM NON REACTIVE   C-Diff Recent Labs    02/10/23 0524  CDIFFTOX POSITIVE*    Studies/Results: MR MRV ABDOMEN W WO CONTRAST Result Date: 02/11/2023 CLINICAL DATA:  Hepatic cirrhosis, ascites and possible mesenteric/portal venous thrombus. EXAM: MRA ABDOMEN AND PELVIS WITH CONTRAST TECHNIQUE: Multiplanar, multiecho pulse  sequences of the abdomen and pelvis were obtained with intravenous contrast. Angiographic images of abdomen and pelvis were obtained using MRA technique with intravenous contrast. CONTRAST:  8mL GADAVIST GADOBUTROL 1 MMOL/ML IV SOLN COMPARISON:  Hepatic liver duplex ultrasound 02/09/2023 FINDINGS: VASCULAR Aorta: Normal in caliber.  No evidence of aneurysm or dissection. Celiac artery: Widely patent. Conventional hepatic arterial anatomy. No dissection or aneurysm. SMA: Widely patent. Renal arteries: Solitary renal arteries bilaterally. Early branching on the right into superior and inferior divisions 1 cm from the origin. Widely patent. No dissection, aneurysm or changes of FMD. IMA: Widely patent. Inflow arteries: Widely patent without evidence of stenosis, aneurysm or dissection. Venous: The IVC and renal veins are widely patent. Diminutive right and left main portal veins. Signal void and absence of enhancement within the main portal vein extending into the proximal SMV consistent with chronic thrombus. In retrospect, there was a small amount of nonocclusive thrombus in the main portal vein on the prior MRI from April of 2024. This appears to have progressed. Splenic vein remains patent. Recanalized periumbilical vein. Extensive portosystemic collaterals along the anterior abdominal wall. NON VASCULAR Cirrhotic morphology of the liver. There is a peripherally enhancing centrally cystic lesion in the most inferior and medial aspect of hepatic segment 5 immediately adjacent to the gallbladder fossa. This measures 1.6 x 1.2 cm which is significantly smaller comparing to a prior MRI from June  of 2023 when the structure measured 2.2 cm by 1.4 cm. Given stability and decreasing size over time, this almost certainly represents a mildly complex cyst. Evaluation for arterially enhancing lesions are limited due to venography technique for this exam. Marked splenomegaly. Increased portosystemic collaterals. Small volume  ascites. No acute abnormality. IMPRESSION: 1. Positive for subacute to chronic thrombus within the main portal vein extending into the proximal SMV. The right and left portal veins and intrahepatic portal veins are diminutive. Patient likely in the process of undergoing cavernous transformation. Additionally, there is recanalization of the paraumbilical vein and increasing extensive portosystemic collaterals in the anterior abdomen and anterior abdominal wall. In retrospect, a small amount of nonocclusive thrombus was evidence in the main portal vein on the prior MRI from April of 2024. 2. Hepatic cirrhosis with splenomegaly and portal hypertension. 3. No acute arterial abnormality. Electronically Signed   By: Malachy Moan M.D.   On: 02/11/2023 07:38   DG Chest 1 View Result Date: 02/10/2023 CLINICAL DATA:  Dyspnea, shortness of breath EXAM: CHEST  1 VIEW COMPARISON:  02/08/2023 FINDINGS: Lungs are clear. Heart size and mediastinal contours are within normal limits. No effusion. Visualized bones unremarkable. IMPRESSION: No acute cardiopulmonary disease. Electronically Signed   By: Corlis Leak M.D.   On: 02/10/2023 08:29   US LIVER DOPPLER Result Date: 02/10/2023 CLINICAL DATA:  Cirrhosis, atrophic nodular liver EXAM: DUPLEX ULTRASOUND OF LIVER TECHNIQUE: Color and duplex Doppler ultrasound was performed to evaluate the hepatic in-flow and out-flow vessels. COMPARISON:  CTA 02/08/2023 FINDINGS: Liver: Normal parenchymal echogenicity. Nodular hepatic contour. No focal lesion, mass or intrahepatic biliary ductal dilatation. Main Portal Vein size: 1.1 cm Portal Vein Velocities (all hepatopetal): Main :  22-28 cm/sec.  Portosplenic confluence not visualized. Right: 28 cm/sec Left: 27 cm/sec Hepatic Vein Velocities (all hepatofugal): Right:  18 cm/sec Middle:  49 cm/sec Left:  24 cm/sec IVC: Present and patent with normal respiratory phasicity. Hepatic Artery Velocity:  108 cm/sec Splenic Vein Velocity:  32  cm/sec Spleen: 14 cm x 17.6 cm x 6.5 cm with a total volume of 384 cm^3 (411 cm^3 is upper limit normal) Portal Vein Occlusion/Thrombus: None visualized, although the Portosplenic confluence and region of concern seen on prior CT is obscured by overlying bowel gas. Splenic Vein Occlusion/Thrombus: Obscured by overlying bowel gas Ascites: Small volume Varices: None identified IMPRESSION: 1. Cirrhosis with small volume ascites. 2. The SMV and portosplenic confluence is obscured by overlying bowel gas, limiting assessment for the possible venous thrombosis seen on recent CTA. Abdomen MRI/MRV may provide more definitive characterization if clinically indicated. 3. Splenomegaly. Electronically Signed   By: Corlis Leak M.D.   On: 02/10/2023 08:18   IR Paracentesis Result Date: 02/09/2023 INDICATION: Patient with a history of cirrhosis presents today with ascites. Interventional radiology asked to perform a diagnostic and therapeutic paracentesis. EXAM: ULTRASOUND GUIDED PARACENTESIS MEDICATIONS: 1% lidocaine 10 mL COMPLICATIONS: None immediate. PROCEDURE: Informed written consent was obtained from the patient after a discussion of the risks, benefits and alternatives to treatment. A timeout was performed prior to the initiation of the procedure. Initial ultrasound scanning demonstrates a large amount of ascites within the right lower abdominal quadrant. The right lower abdomen was prepped and draped in the usual sterile fashion. 1% lidocaine was used for local anesthesia. Following this, a 19 gauge, 7-cm, Yueh catheter was introduced. An ultrasound image was saved for documentation purposes. The paracentesis was performed. The catheter was removed and a dressing was applied. The patient tolerated  the procedure well without immediate post procedural complication. Patient received post-procedure intravenous albumin; see nursing notes for details. FINDINGS: A total of approximately 2.5 L of clear yellow fluid was removed.  Samples were sent to the laboratory as requested by the clinical team. IMPRESSION: Successful ultrasound-guided paracentesis yielding 2.5 liters of peritoneal fluid. Procedure performed by Alwyn Ren NP PLAN: The patient has previously been evaluated by the [facility] Interventional Radiology Portal Hypertension [facility], and deemed not a candidate for intervention. Electronically Signed   By: Marliss Coots M.D.   On: 02/09/2023 15:09    Medications: I have reviewed the patient's current medications. Scheduled:  acamprosate  333 mg Oral TID   aMILoride  10 mg Oral Daily   vitamin B-12  1,000 mcg Oral Daily   furosemide  20 mg Oral Daily   lactulose  10 g Oral TID   pantoprazole (PROTONIX) IV  40 mg Intravenous BID   potassium chloride  40 mEq Oral Q6H   vancomycin  125 mg Oral QID   Continuous: NWG:[id number], ondansetron, oxyCODONE  Assessment/Plan: 48 year old male with history of alcoholic cirrhosis with esophageal varices, encephalopathy, and ascites presented with abdominal pain, fevers, and hematochezia. Patient has also had confusion, nausea, vomiting, and diarrhea.  C. difficile was positive.  Patient had a paracentesis done earlier in this hospitalization that was negative for SBP.  Patient had an EGD done yesterday with banding of his large non-bleeding esophageal varices and oozing PHG. Patient's rectal bleeding is favored to be due to C. difficile colitis. Patient's MRI/MRV showed subacute to chronic thrombus within the main portal vein is extending into the proximal SMV, which would warrant anticoagulation.  Patient is not describing any bleeding currently and his hemoglobin has remained stable.  Thus I do think it is reasonable to start him on IV heparin for now to make sure that he does not develop any recurrent bleeding.    -Soft diet for 3 days after variceal banding.  Low-sodium diet <2  gram per day -Being started on oral diuretics including Lasix 20 mg daily and  amiloride 10 mg daily.  Patient previously developed gynecomastia on spironolactone.  Can continue to increase diuretics as tolerated. -Consider interval paracentesis, potentially tomorrow for comfort. -PPI twice daily for 8 weeks to help with oozing PHG seen on EGD -Continue antibiotics for SBP prophylaxis for a total of 7 days in the setting of recent hematochezia. -Starting on IV heparin gtt for PVT thrombus extending into the proximal SMV -Repeat EGD indicated in in 1 month for variceal banding -Continue lactulose -Continue p.o. vancomycin for treatment of C. Difficile -Patient will need to be restarted on propranolol prior to discharge   LOS: 3 days   Imogene Burn 02/11/2023, 12:15 PM

## 2023-02-11 NOTE — Plan of Care (Signed)
Notified by nurse that mother wanted update. Confirmed with patient, by phone, okay to give mother updates. Called x2, but number provided was incorrect. Notified nursing staff.

## 2023-02-11 NOTE — Progress Notes (Addendum)
Daily Progress Note Intern Pager: [phone]  Patient name: Joshua Hubbard Medical record number: [id number] Date of birth: 15-Apr-1975 Age: 48 y.o. Gender: male  Primary Care Provider: Nestor Ramp, MD Consultants: Heme/Onc, GI Code Status: FULL CODE  Pt Overview and Major Events to Date:  01/16: Admitted 01/17: + C. Diff, Esophageal varices banded by EGD  Assessment and Plan:  Joshua Hubbard is a 48 y.o. male with hx of cirrhosis, hepatic encephalopathy, AUD, HTN presenting with BRBPR and abdominal swelling. Found to be C. Diff + (likely cause of BRBPR) , esophogeal varices found and banded, portal hypertensive gastropathy. Susepct ascites 2/2 decompensated liver cirrhosis. Additionally leuko/neutropenic, thought to be 2/2 liver dysfunction and chronic disease. Fevers believed to be 2/2 c. Diff colitis. Assessment & Plan Acute GI bleeding Resolving. Hgb stable 11.6. Likely 2/2 C.Diff colitis. S/P esophageal varice banding. MRV abdomen: Positive for subacute to chronic thrombus within the main portal vein extending into the proximal SMV. The right and left portal veins and intrahepatic portal veins are diminutive. Plan to touch base with GI today. -GI consulted, appreciate recommendations -Advance to soft diet today -PPI for 8 weeks -PO Vanc -Hold anticoagulation -AM CBC, CMP, Mg - replete electrolytes as indicated Cirrhosis of liver with ascites (HCC) SBP labs negative. Ascites remains improved s/p paracentesis. T. Bili remains elevated. Evidence of poral HTN. May benefit from SBP prophylaxis. Repeat Ammonia today, if elevated may need to increase lactulose. - GI consulted, appreciate recommendations - Oxycodone 15 mg BID PRN for pain - Tylenol 500 mg q6 PRN for fever, could consider IV Tylenol if NPO -f/u AFP - Check ammonia - Continue acamprosate  - Continue lactulose  - Restart Lasix 20 mg daily - Consider adding propanolol (HR elevated, but soft BP) Pancytopenia  (HCC) WBC 3.9 today. Neutropenia transiently resolved this AM. Started B12 supplementation per Heme recs. - Heme consulted, appreciate recommendations -Transfuse PRBC for Hgb <7.0.  -Transfuse platelets for counts <20K or <50 K with active bleeding.   Chronic and Stable Problems:  AUD - Pt denies alcohol use since 2021, continue acamprosate. CIWAs without ativan. Chronic pain - oxycodone 15 mg BID prn. Would consider decreasing dose as patient does appear to be cognitively slowed.  FEN/GI: Soft siet PPx: SCDs Dispo: Pending clinical improvement.    Subjective:  Still reports abdominal pain.  Objective: Temp:  [97.6 F (36.4 C)-100.6 F (38.1 C)] 99.1 F (37.3 C) (01/18 0357) Pulse Rate:  [89-104] 104 (01/18 0357) Resp:  [15-21] 18 (01/18 0357) BP: (93-134)/(55-80) 134/70 (01/18 0357) SpO2:  [91 %-97 %] 95 % (01/18 0357) Weight:  [79.1 kg-79.7 kg] 79.7 kg (01/18 0357) Physical Exam: General: Not in acute distress, chronically ill-appearing Cardiovascular: RRR, no murmurs Respiratory: CTAB, normal work of breathing on room air Abdomen: Distended, tender over epigastric region. Extremities: Moving all 4 extremities, +1 pitting edema in lower extremities  Laboratory: Most recent CBC Lab Results  Component Value Date   WBC 3.9 (L) 02/11/2023   HGB 11.6 (L) 02/11/2023   HCT 32.2 (L) 02/11/2023   MCV 95.3 02/11/2023   PLT 98 (L) 02/11/2023   Most recent BMP    Latest Ref Rng & Units 02/11/2023    2:35 AM  BMP  Glucose 70 - 99 mg/dL 403   BUN 6 - 20 mg/dL 8   Creatinine 4.74 - 2.59 mg/dL 5.63   Sodium 875 - 643 mmol/L 129   Potassium 3.5 - 5.1 mmol/L 3.0   Chloride  98 - 111 mmol/L 99   CO2 22 - 32 mmol/L 22   Calcium 8.9 - 10.3 mg/dL 8.0     Tiffany Kocher, DO 02/11/2023, 7:20 AM  PGY-2, [facility] FPTS Intern pager: [phone], text pages welcome Secure chat group [facility]

## 2023-02-11 NOTE — Plan of Care (Signed)
FMTS Brief Progress Note  S:Still having abdominal pain but much improved after paracentesis yesterday. No other complaints at this time. States HR was a high into 120-130's earlier when he was walking around to tidy up his room and shave.    O: BP 117/75   Pulse (!) 118   Temp 100.2 F (37.9 C) (Oral)   Resp 18   Ht 5\' 9"  (1.753 m)   Wt 79.7 kg   SpO2 98%   BMI 25.96 kg/m   Pt lying in bed, comfortable appearing, NAD, pleasant to speak with Heart - tachycardic regular rhythm, with soft systolic murmur  Lungs - normal WOB  A/P:  GI bleed - hgb stable, s/p banding for varices, on PPI Hepatic encephalopathy - on lactulose  Cirrhosis w/ ascites, portal HTN, portal vein thrombus - started IV heparin today for thrombus  Started lasix and amiloride and plan for possible paracentesis tomorrow for ascites  For pain, discussed option of changing oxy from 15 mg BID to 10 mg TID to spread coverage throughout day. Will discuss with day team tomorrow.  C diff - cont vanc  Pancytopenia - hem following,likely d/t cirrhosis  Murmur- heard on exam tonight, would consider echo tomorrow  Erick Alley, DO 02/11/2023, 8:08 PM PGY-3, [facility] Night Resident  Please page [phone] with questions.

## 2023-02-11 NOTE — Plan of Care (Addendum)
Spoke with Dr. Leonides Schanz from GI.  Plan: Given MRV findings, we will start heparin drip for portal vein thrombus. Monitor Hgb and signs of bleeding. Consideration of paracentesis tomorrow for ascites.  Okay to restart Lasix.  GI restarted amiloride today. Recommend 7-day prophylaxis for SBP when patients with cirrhosis have GI bleed.  Ceftriaxone added back on. In general, recommend against trending ammonia levels.  Base lactulose treatment on bowel movements and general concern for hepatic encephalopathy. Will hold off on propranolol at this time given soft blood pressures.  However, this would likely provide a benefit for portal hypertension.  We will address this on rounds tomorrow.  We greatly appreciate Dr. Leonides Schanz and the GI team for their continued care and recommendations of this patient.

## 2023-02-11 NOTE — Progress Notes (Signed)
PHARMACY - ANTICOAGULATION CONSULT NOTE  Pharmacy Consult for heparin Indication:  portal vein and SMV thrombus  Allergies  Allergen Reactions   Spironolactone Other (See Comments)    Gynecomastia    Tramadol     Made him feel hot and shakey    Patient Measurements: Height: 5\' 9"  (175.3 cm) Weight: 79.7 kg (175 lb 12.8 oz) IBW/kg (Calculated) : 70.7 Heparin Dosing Weight: 79.1kg  Vital Signs: Temp: 100.2 F (37.9 C) (01/18 1122) Temp Source: Oral (01/18 1122) BP: 115/59 (01/18 1122) Pulse Rate: 118 (01/18 1122)  Labs: Recent Labs    02/08/23 1428 02/09/23 0244 02/09/23 1020 02/10/23 0244 02/11/23 0235  HGB  --  10.6* 11.5* 10.3* 11.6*  HCT  --  29.7* 32.2* 29.2* 32.2*  PLT  --  59* 60* 63* 98*  LABPROT 24.1*  --   --  23.7*  --   INR 2.1*  --   --  2.1*  --   CREATININE  --  0.71  --  0.68 0.73    Estimated Creatinine Clearance: 112.9 mL/min (by C-G formula based on SCr of 0.73 mg/dL).   Medical History: Past Medical History:  Diagnosis Date   Arthritis    Blood transfusion without reported diagnosis    recevied 4 units PRBC and FFP during hospitalization from 1/17-1/22/21   Cirrhosis (HCC)    Hypertension    PONV (postoperative nausea and vomiting)    Tears of meniscus and ACL of left knee 03/19/2018    Assessment: 47YOM with PMH cirrhosis, hepatic encephalopathy, AUD, HTN  presented with abdominal pain and swelling. MRI 1/18 confirmed portal vein thrombus and pharmacy consulted to dose heparin.   Hgb 11.6, plt 98 low but trending up. No signs/symptoms of bleeding noted.  Goal of Therapy:  Heparin level 0.3-0.7 units/ml Monitor platelets by anticoagulation protocol: Yes   Plan:  Give 5000 units heparin bolus x 1 Start heparin infusion at 1350 units/hr Check anti-Xa level in 6 hours and daily while on heparin Monitor CBC and for signs/symptoms of bleeding  Stephenie Acres, PharmD PGY1 Pharmacy Resident 02/11/2023 1:17 PM

## 2023-02-12 ENCOUNTER — Encounter (HOSPITAL_COMMUNITY): Payer: Self-pay | Admitting: Internal Medicine

## 2023-02-12 ENCOUNTER — Inpatient Hospital Stay (HOSPITAL_COMMUNITY): Payer: BLUE CROSS/BLUE SHIELD

## 2023-02-12 DIAGNOSIS — I8501 Esophageal varices with bleeding: Secondary | ICD-10-CM | POA: Diagnosis not present

## 2023-02-12 DIAGNOSIS — D61818 Other pancytopenia: Secondary | ICD-10-CM | POA: Diagnosis not present

## 2023-02-12 DIAGNOSIS — K7031 Alcoholic cirrhosis of liver with ascites: Secondary | ICD-10-CM | POA: Diagnosis not present

## 2023-02-12 DIAGNOSIS — R011 Cardiac murmur, unspecified: Secondary | ICD-10-CM | POA: Insufficient documentation

## 2023-02-12 DIAGNOSIS — K2981 Duodenitis with bleeding: Secondary | ICD-10-CM | POA: Diagnosis not present

## 2023-02-12 DIAGNOSIS — R3911 Hesitancy of micturition: Secondary | ICD-10-CM | POA: Insufficient documentation

## 2023-02-12 DIAGNOSIS — K922 Gastrointestinal hemorrhage, unspecified: Secondary | ICD-10-CM | POA: Diagnosis not present

## 2023-02-12 DIAGNOSIS — K3189 Other diseases of stomach and duodenum: Secondary | ICD-10-CM | POA: Diagnosis not present

## 2023-02-12 LAB — CBC WITH DIFFERENTIAL/PLATELET
Abs Immature Granulocytes: 0.14 10*3/uL — ABNORMAL HIGH (ref 0.00–0.07)
Basophils Absolute: 0 10*3/uL (ref 0.0–0.1)
Basophils Relative: 1 %
Eosinophils Absolute: 0.1 10*3/uL (ref 0.0–0.5)
Eosinophils Relative: 1 %
HCT: 29.1 % — ABNORMAL LOW (ref 39.0–52.0)
Hemoglobin: 10.4 g/dL — ABNORMAL LOW (ref 13.0–17.0)
Immature Granulocytes: 3 %
Lymphocytes Relative: 11 %
Lymphs Abs: 0.5 10*3/uL — ABNORMAL LOW (ref 0.7–4.0)
MCH: 34.3 pg — ABNORMAL HIGH (ref 26.0–34.0)
MCHC: 35.7 g/dL (ref 30.0–36.0)
MCV: 96 fL (ref 80.0–100.0)
Monocytes Absolute: 1.3 10*3/uL — ABNORMAL HIGH (ref 0.1–1.0)
Monocytes Relative: 31 %
Neutro Abs: 2.2 10*3/uL (ref 1.7–7.7)
Neutrophils Relative %: 53 %
Platelets: 90 10*3/uL — ABNORMAL LOW (ref 150–400)
RBC: 3.03 MIL/uL — ABNORMAL LOW (ref 4.22–5.81)
RDW: 17.9 % — ABNORMAL HIGH (ref 11.5–15.5)
Smear Review: NORMAL
WBC: 4.2 10*3/uL (ref 4.0–10.5)
nRBC: 1 % — ABNORMAL HIGH (ref 0.0–0.2)

## 2023-02-12 LAB — MAGNESIUM: Magnesium: 1.4 mg/dL — ABNORMAL LOW (ref 1.7–2.4)

## 2023-02-12 LAB — COMPREHENSIVE METABOLIC PANEL
ALT: 22 U/L (ref 0–44)
AST: 49 U/L — ABNORMAL HIGH (ref 15–41)
Albumin: 2.4 g/dL — ABNORMAL LOW (ref 3.5–5.0)
Alkaline Phosphatase: 62 U/L (ref 38–126)
Anion gap: 10 (ref 5–15)
BUN: <5 mg/dL — ABNORMAL LOW (ref 6–20)
CO2: 20 mmol/L — ABNORMAL LOW (ref 22–32)
Calcium: 7.6 mg/dL — ABNORMAL LOW (ref 8.9–10.3)
Chloride: 95 mmol/L — ABNORMAL LOW (ref 98–111)
Creatinine, Ser: 0.81 mg/dL (ref 0.61–1.24)
GFR, Estimated: >60 mL/min (ref >60)
Glucose, Bld: 129 mg/dL — ABNORMAL HIGH (ref 70–99)
Potassium: 3.2 mmol/L — ABNORMAL LOW (ref 3.5–5.1)
Sodium: 125 mmol/L — ABNORMAL LOW (ref 135–145)
Total Bilirubin: 6.7 mg/dL — ABNORMAL HIGH (ref 0.0–1.2)
Total Protein: 5.8 g/dL — ABNORMAL LOW (ref 6.5–8.1)

## 2023-02-12 LAB — APTT: aPTT: 184 s (ref 24–36)

## 2023-02-12 LAB — CBC
HCT: 31.3 % — ABNORMAL LOW (ref 39.0–52.0)
Hemoglobin: 11.1 g/dL — ABNORMAL LOW (ref 13.0–17.0)
MCH: 34.6 pg — ABNORMAL HIGH (ref 26.0–34.0)
MCHC: 35.5 g/dL (ref 30.0–36.0)
MCV: 97.5 fL (ref 80.0–100.0)
Platelets: 89 10*3/uL — ABNORMAL LOW (ref 150–400)
RBC: 3.21 MIL/uL — ABNORMAL LOW (ref 4.22–5.81)
RDW: 18.2 % — ABNORMAL HIGH (ref 11.5–15.5)
WBC: 4.3 10*3/uL (ref 4.0–10.5)
nRBC: 0.5 % — ABNORMAL HIGH (ref 0.0–0.2)

## 2023-02-12 LAB — ECHOCARDIOGRAM COMPLETE
Area-P 1/2: 2.86 cm2
Height: 69 in
S' Lateral: 2.7 cm
Weight: 2865.6 [oz_av]

## 2023-02-12 LAB — HEPARIN LEVEL (UNFRACTIONATED)
Heparin Unfractionated: <0.1 [IU]/mL — ABNORMAL LOW (ref 0.30–0.70)
Heparin Unfractionated: 0.23 [IU]/mL — ABNORMAL LOW (ref 0.30–0.70)

## 2023-02-12 MED ORDER — MAGNESIUM SULFATE 4 GM/100ML IV SOLN
4.0000 g | Freq: Once | INTRAVENOUS | Status: AC
Start: 2023-02-12 — End: 2023-02-12
  Administered 2023-02-12: 4 g via INTRAVENOUS
  Filled 2023-02-12: qty 100

## 2023-02-12 MED ORDER — HEPARIN BOLUS VIA INFUSION
2000.0000 [IU] | Freq: Once | INTRAVENOUS | Status: AC
Start: 2023-02-12 — End: 2023-02-12
  Administered 2023-02-12: 2000 [IU] via INTRAVENOUS
  Filled 2023-02-12: qty 2000

## 2023-02-12 MED ORDER — OXYCODONE HCL 5 MG PO TABS
10.0000 mg | ORAL_TABLET | Freq: Three times a day (TID) | ORAL | Status: DC | PRN
  Administered 2023-02-12 (×2): 10 mg via ORAL
  Filled 2023-02-12 (×2): qty 2

## 2023-02-12 MED ORDER — POTASSIUM CHLORIDE CRYS ER 20 MEQ PO TBCR
40.0000 meq | EXTENDED_RELEASE_TABLET | Freq: Four times a day (QID) | ORAL | Status: AC
  Administered 2023-02-12 (×2): 40 meq via ORAL
  Filled 2023-02-12 (×2): qty 2

## 2023-02-12 MED ORDER — PROPRANOLOL HCL 10 MG PO TABS
10.0000 mg | ORAL_TABLET | Freq: Two times a day (BID) | ORAL | Status: DC
  Administered 2023-02-12 – 2023-02-13 (×3): 10 mg via ORAL
  Filled 2023-02-12 (×4): qty 1

## 2023-02-12 MED ORDER — OXYCODONE HCL 5 MG PO TABS
10.0000 mg | ORAL_TABLET | Freq: Three times a day (TID) | ORAL | Status: DC | PRN
  Administered 2023-02-13: 10 mg via ORAL
  Filled 2023-02-12: qty 2

## 2023-02-12 NOTE — Progress Notes (Signed)
Patient ID: Joshua Hubbard, male   DOB: [birth year], 48 y.o.   MRN: [id number] Pt presented to [place] dept today for therapeutic paracentesis. On limited US abd in all four quadrants there is only minimal ascites noted, primarily RUQ near liver and with bowel loops in close proximity. Procedure was cancelled. Pt informed.

## 2023-02-12 NOTE — Plan of Care (Signed)
Secure chat messaged by Dr. Mosetta Putt with hematology that portal vein thrombosis should not be treated with anticoagulation given risk outweighing benefit. Messaged Dr. Leonides Schanz with GI who was okay with stopping anticoagulation per hematology recs. I have discontinued heparin drip.

## 2023-02-12 NOTE — Progress Notes (Signed)
*  PRELIMINARY RESULTS* Echocardiogram 2D Echocardiogram has been performed.  Stacey Drain 02/12/2023, 4:08 PM

## 2023-02-12 NOTE — Progress Notes (Signed)
PHARMACY - ANTICOAGULATION CONSULT NOTE  Pharmacy Consult for heparin Indication:  SMV/PV thrombosis  Labs: Recent Labs    02/10/23 0244 02/11/23 0235 02/11/23 1956 02/12/23 0255  HGB 10.3* 11.6*  --  10.4*  HCT 29.2* 32.2*  --  29.1*  PLT 63* 98*  --  90*  LABPROT 23.7*  --   --   --   INR 2.1*  --   --   --   HEPARINUNFRC  --   --  0.22* <0.10*  CREATININE 0.68 0.73  --  0.81   Assessment: 47yo male subtherapeutic on heparin with initial dosing for subacute to chronic thrombus of PV extending to SMV; no infusion issues or signs of bleeding per RN.  Goal of Therapy:  Heparin level 0.3-0.7 units/ml   Plan:  2000 units heparin bolus. Increase heparin infusion by 3 units/kg/hr to 1600 units/hr. Check level in 6 hours.   Vernard Gambles, PharmD, BCPS 02/12/2023 4:19 AM

## 2023-02-12 NOTE — Assessment & Plan Note (Addendum)
WBC 4.2 today. Neutropenia resolved. Started B12 supplementation per heme recs. On heparin gtt.  - CBC 4 PM - Heme consulted, appreciate recommendations - Transfuse PRBC for Hgb <7.0.  - Transfuse platelets for counts <20K or <50 K with active bleeding.

## 2023-02-12 NOTE — Progress Notes (Signed)
Joshua Hubbard   DOB:21-Nov-1975   WG#:[id number]   VHQ#:[id number]  Hematology follow-up note  Subjective: Patient's pancytopenia has improved, no rectal bleeding in the past few days.  He was started on heparin for portal vein thrombosis.   Objective:  Vitals:   02/12/23 0811 02/12/23 1108  BP: (!) 144/74 115/68  Pulse:  98  Resp: 20 18  Temp: 99.2 F (37.3 C) 98.7 F (37.1 C)  SpO2: 96% 96%    Body mass index is 26.45 kg/m.  Intake/Output Summary (Last 24 hours) at 02/12/2023 1507 Last data filed at 02/12/2023 1413 Gross per 24 hour  Intake 1022.4 ml  Output --  Net 1022.4 ml     Sclerae unicteric  Oropharynx clear  No peripheral adenopathy  Lungs clear -- no rales or rhonchi  Heart regular rate and rhythm  Abdomen slightly distended, nontender  MSK no focal spinal tenderness, no peripheral edema  Neuro nonfocal    CBG (last 3)  No results for input(s): "GLUCAP" in the last 72 hours.   Labs:  Lab Results  Component Value Date   WBC 4.2 02/12/2023   HGB 10.4 (L) 02/12/2023   HCT 29.1 (L) 02/12/2023   MCV 96.0 02/12/2023   PLT 90 (L) 02/12/2023   NEUTROABS 2.2 02/12/2023    Urine Studies No results for input(s): "UHGB", "CRYS" in the last 72 hours.  Invalid input(s): "UACOL", "UAPR", "USPG", "UPH", "UTP", "UGL", "UKET", "UBIL", "UNIT", "UROB", "ULEU", "UEPI", "UWBC", "URBC", "UBAC", "CAST", "UCOM", "BILUA"  Basic Metabolic Panel: Recent Labs  Lab 02/08/23 1427 02/08/23 1428 02/09/23 0244 02/10/23 0244 02/11/23 0235 02/12/23 0255  NA 130*  --  129* 132* 129* 125*  K 2.7*  --  2.9* 3.6 3.0* 3.2*  CL 99  --  100 101 99 95*  CO2 23  --  22 24 22  20*  GLUCOSE 115*  --  108* 101* 115* 129*  BUN 12  --  8 9 8  <5*  CREATININE 0.56*  --  0.71 0.68 0.73 0.81  CALCIUM 7.6*  --  7.1* 7.8* 8.0* 7.6*  MG  --  1.5* 1.7 1.8 1.5* 1.4*   GFR Estimated Creatinine Clearance: 111.5 mL/min (by C-G formula based on SCr of 0.81 mg/dL). Liver Function Tests: Recent  Labs  Lab 02/08/23 1427 02/09/23 0244 02/10/23 0244 02/11/23 0235 02/12/23 0255  AST 41 35 36 43* 49*  ALT 32 25 22 25 22   ALKPHOS 64 57 56 64 62  BILITOT 8.2* 6.8* 8.3* 8.1* 6.7*  PROT 5.8* 5.3* 5.8* 6.5 5.8*  ALBUMIN 2.3* 1.9* 2.4* 2.7* 2.4*   Recent Labs  Lab 02/07/23 1239  LIPASE 40   Recent Labs  Lab 02/07/23 1239 02/08/23 1619  AMMONIA 16 21   Coagulation profile Recent Labs  Lab 02/08/23 1428 02/10/23 0244  INR 2.1* 2.1*    CBC: Recent Labs  Lab 02/08/23 1427 02/09/23 0244 02/09/23 1020 02/10/23 0244 02/11/23 0235 02/12/23 0255  WBC 1.2* 1.1* 1.2* 1.8* 3.9* 4.2  NEUTROABS 0.6*  --  0.3* 0.5* 2.0 2.2  HGB 11.7* 10.6* 11.5* 10.3* 11.6* 10.4*  HCT 32.7* 29.7* 32.2* 29.2* 32.2* 29.1*  MCV 95.6 96.1 96.7 96.7 95.3 96.0  PLT 67* 59* 60* 63* 98* 90*   Cardiac Enzymes: No results for input(s): "CKTOTAL", "CKMB", "CKMBINDEX", "TROPONINI" in the last 168 hours. BNP: Invalid input(s): "POCBNP" CBG: No results for input(s): "GLUCAP" in the last 168 hours. D-Dimer No results for input(s): "DDIMER" in the last 72 hours. Hgb  A1c No results for input(s): "HGBA1C" in the last 72 hours. Lipid Profile No results for input(s): "CHOL", "HDL", "LDLCALC", "TRIG", "CHOLHDL", "LDLDIRECT" in the last 72 hours. Thyroid function studies No results for input(s): "TSH", "T4TOTAL", "T3FREE", "THYROIDAB" in the last 72 hours.  Invalid input(s): "FREET3" Anemia work up Recent Labs    02/10/23 0244 02/11/23 0235  VITAMINB12 226  --   FOLATE 8.1  --   FERRITIN  --  375*  TIBC  --  172*  IRON  --  80  RETICCTPCT  --  3.4*   Microbiology Recent Results (from the past 240 hours)  Urine Culture (for pregnant, neutropenic or urologic patients or patients with an indwelling urinary catheter)     Status: None   Collection Time: 02/09/23  4:24 AM   Specimen: Urine, Clean Catch  Result Value Ref Range Status   Specimen Description URINE, CLEAN CATCH  Final   Special  Requests NONE  Final   Culture   Final    NO GROWTH Performed at [facility], [address], [place], [place] [address]    Report Status 02/10/2023 FINAL  Final  Culture, blood (Routine X 2) w Reflex to ID Panel     Status: None (Preliminary result)   Collection Time: 02/09/23  4:35 AM   Specimen: BLOOD RIGHT HAND  Result Value Ref Range Status   Specimen Description BLOOD RIGHT HAND  Final   Special Requests   Final    BOTTLES DRAWN AEROBIC AND ANAEROBIC Blood Culture results may not be optimal due to an inadequate volume of blood received in culture bottles   Culture   Final    NO GROWTH 3 DAYS Performed at [facility], [address] [place], [place] [address]    Report Status PENDING  Incomplete  Culture, blood (Routine X 2) w Reflex to ID Panel     Status: None (Preliminary result)   Collection Time: 02/09/23  4:41 AM   Specimen: BLOOD LEFT HAND  Result Value Ref Range Status   Specimen Description BLOOD LEFT HAND  Final   Special Requests   Final    BOTTLES DRAWN AEROBIC AND ANAEROBIC Blood Culture adequate volume   Culture   Final    NO GROWTH 3 DAYS Performed at [facility], [address] [place], [place] [address]    Report Status PENDING  Incomplete  Gram stain     Status: None   Collection Time: 02/09/23  2:07 PM   Specimen: Abdomen; Peritoneal Fluid  Result Value Ref Range Status   Specimen Description PERITONEAL  Final   Special Requests NONE  Final   Gram Stain   Final    WBC PRESENT, PREDOMINANTLY MONONUCLEAR NO ORGANISMS SEEN CYTOSPIN SMEAR Performed at [facility], [address], [place], [place] [address]    Report Status 02/09/2023 FINAL  Final  Culture, body fluid w Gram Stain-bottle     Status: None (Preliminary result)   Collection Time: 02/09/23  2:10 PM   Specimen: Peritoneal Washings  Result Value Ref Range Status   Specimen Description PERITONEAL  Final   Special Requests BOTTLES DRAWN AEROBIC AND ANAEROBIC   Final   Culture   Final    NO GROWTH 3 DAYS Performed at [facility], [address], [place], [place] [address]    Report Status PENDING  Incomplete  C Difficile Quick Screen w PCR reflex     Status: Abnormal  Collection Time: 02/10/23  5:24 AM   Specimen: STOOL  Result Value Ref Range Status   C Diff antigen POSITIVE (A) NEGATIVE Final   C Diff toxin POSITIVE (A) NEGATIVE Final   C Diff interpretation Toxin producing C. difficile detected.  Final    Comment: CRITICAL RESULT CALLED TO, READ BACK BY AND VERIFIED WITH: T PATAKY,RN@[id number]  02/10/23 MK Performed at [facility], [address] [place], [place] [address]   Gastrointestinal Panel by PCR , Stool     Status: None   Collection Time: 02/10/23  5:24 AM   Specimen: STOOL  Result Value Ref Range Status   Campylobacter species NOT DETECTED NOT DETECTED Final   Plesimonas shigelloides NOT DETECTED NOT DETECTED Final   Salmonella species NOT DETECTED NOT DETECTED Final   Yersinia enterocolitica NOT DETECTED NOT DETECTED Final   Vibrio species NOT DETECTED NOT DETECTED Final   Vibrio cholerae NOT DETECTED NOT DETECTED Final   Enteroaggregative E coli (EAEC) NOT DETECTED NOT DETECTED Final   Enteropathogenic E coli (EPEC) NOT DETECTED NOT DETECTED Final   Enterotoxigenic E coli (ETEC) NOT DETECTED NOT DETECTED Final   Shiga like toxin producing E coli (STEC) NOT DETECTED NOT DETECTED Final   Shigella/Enteroinvasive E coli (EIEC) NOT DETECTED NOT DETECTED Final   Cryptosporidium NOT DETECTED NOT DETECTED Final   Cyclospora cayetanensis NOT DETECTED NOT DETECTED Final   Entamoeba histolytica NOT DETECTED NOT DETECTED Final   Giardia lamblia NOT DETECTED NOT DETECTED Final   Adenovirus F40/41 NOT DETECTED NOT DETECTED Final   Astrovirus NOT DETECTED NOT DETECTED Final   Norovirus GI/GII NOT DETECTED NOT DETECTED Final   Rotavirus A NOT DETECTED NOT DETECTED Final   Sapovirus (I, II, IV, and V) NOT DETECTED NOT  DETECTED Final    Comment: Performed at [facility], [address] [place], [place] [address]      Studies:  MR MRV ABDOMEN W WO CONTRAST Result Date: 02/11/2023 CLINICAL DATA:  Hepatic cirrhosis, ascites and possible mesenteric/portal venous thrombus. EXAM: MRA ABDOMEN AND PELVIS WITH CONTRAST TECHNIQUE: Multiplanar, multiecho pulse sequences of the abdomen and pelvis were obtained with intravenous contrast. Angiographic images of abdomen and pelvis were obtained using MRA technique with intravenous contrast. CONTRAST:  8mL GADAVIST GADOBUTROL 1 MMOL/ML IV SOLN COMPARISON:  Hepatic liver duplex ultrasound 02/09/2023 FINDINGS: VASCULAR Aorta: Normal in caliber.  No evidence of aneurysm or dissection. Celiac artery: Widely patent. Conventional hepatic arterial anatomy. No dissection or aneurysm. SMA: Widely patent. Renal arteries: Solitary renal arteries bilaterally. Early branching on the right into superior and inferior divisions 1 cm from the origin. Widely patent. No dissection, aneurysm or changes of FMD. IMA: Widely patent. Inflow arteries: Widely patent without evidence of stenosis, aneurysm or dissection. Venous: The IVC and renal veins are widely patent. Diminutive right and left main portal veins. Signal void and absence of enhancement within the main portal vein extending into the proximal SMV consistent with chronic thrombus. In retrospect, there was a small amount of nonocclusive thrombus in the main portal vein on the prior MRI from April of 2024. This appears to have progressed. Splenic vein remains patent. Recanalized periumbilical vein. Extensive portosystemic collaterals along the anterior abdominal wall. NON VASCULAR Cirrhotic morphology of the liver. There is a peripherally enhancing centrally cystic lesion in the most inferior and medial aspect of hepatic segment 5 immediately adjacent to the gallbladder fossa. This measures 1.6 x 1.2 cm which is significantly smaller  comparing to a prior MRI from  June of 2023 when the structure measured 2.2 cm by 1.4 cm. Given stability and decreasing size over time, this almost certainly represents a mildly complex cyst. Evaluation for arterially enhancing lesions are limited due to venography technique for this exam. Marked splenomegaly. Increased portosystemic collaterals. Small volume ascites. No acute abnormality. IMPRESSION: 1. Positive for subacute to chronic thrombus within the main portal vein extending into the proximal SMV. The right and left portal veins and intrahepatic portal veins are diminutive. Patient likely in the process of undergoing cavernous transformation. Additionally, there is recanalization of the paraumbilical vein and increasing extensive portosystemic collaterals in the anterior abdomen and anterior abdominal wall. In retrospect, a small amount of nonocclusive thrombus was evidence in the main portal vein on the prior MRI from April of 2024. 2. Hepatic cirrhosis with splenomegaly and portal hypertension. 3. No acute arterial abnormality. Electronically Signed   By: Malachy Moan M.D.   On: 02/11/2023 07:38    Assessment: 48 y.o.  Acute GI bleeding C. difficile colitis Liver cirrhosis with ascites and portal hypertension Pancytopenia secondary to liver cirrhosis and infection Portal vein thrombosis, subacute to chronic  Plan:  -His neutropenia has resolved, thrombocytopenia also improved, likely due to controlled C. difficile infection. -No need for pulmonary biopsy, I will follow-up his blood counts after discharge. -I have reviewed his liver MRI, his portal vein thrombosis is subacute to chronic, nonocclusive, and he has collateral formation.  In the setting of liver cirrhosis related to coagulopathy, recent rectal bleeding, the chronic nature of thrombosis, I think the risk of anticoagulation is outweighed the benefit.  I recommended no anticoagulation -I will communicate my recommendation with  the primary team. -I will schedule his follow-up with me in office in a months   Malachy Mood, MD 02/12/2023  3:07 PM

## 2023-02-12 NOTE — Progress Notes (Addendum)
PHARMACY - ANTICOAGULATION CONSULT NOTE  Pharmacy Consult for heparin Indication:  portal vein and SMV thrombus  Allergies  Allergen Reactions   Spironolactone Other (See Comments)    Gynecomastia    Tramadol     Made him feel hot and shakey    Patient Measurements: Height: 5\' 9"  (175.3 cm) Weight: 81.2 kg (179 lb 1.6 oz) IBW/kg (Calculated) : 70.7 Heparin Dosing Weight: 79.1kg  Vital Signs: Temp: 98.7 F (37.1 C) (01/19 1108) Temp Source: Oral (01/19 1108) BP: 115/68 (01/19 1108) Pulse Rate: 98 (01/19 1108)  Labs: Recent Labs    02/10/23 0244 02/11/23 0235 02/11/23 1956 02/12/23 0255 02/12/23 1101  HGB 10.3* 11.6*  --  10.4*  --   HCT 29.2* 32.2*  --  29.1*  --   PLT 63* 98*  --  90*  --   LABPROT 23.7*  --   --   --   --   INR 2.1*  --   --   --   --   HEPARINUNFRC  --   --  0.22* <0.10* 0.23*  CREATININE 0.68 0.73  --  0.81  --     Estimated Creatinine Clearance: 111.5 mL/min (by C-G formula based on SCr of 0.81 mg/dL).   Medical History: Past Medical History:  Diagnosis Date   Arthritis    Blood transfusion without reported diagnosis    recevied 4 units PRBC and FFP during hospitalization from 1/17-1/22/21   Cirrhosis (HCC)    Hypertension    PONV (postoperative nausea and vomiting)    Tears of meniscus and ACL of left knee 03/19/2018    Assessment: 47YOM with PMH cirrhosis, hepatic encephalopathy, AUD, HTN  presented with abdominal pain and swelling. MRI 1/18 confirmed portal vein thrombus and pharmacy consulted to dose heparin.   Heparin level 0.23, subtherapeutic with heparin infusion at 1600 units/hr. Hgb 10.4, plt 90 low stable. No signs/symptoms of bleeding noted.  Of note, patients total bilirubin has been elevated >6 since admission (now trending down 8.1>6.7) but can make heparin level appear falsely low. aPTT is not affected by bilirubin. Will get STAT aPTT and reassess.  Goal of Therapy:  Heparin level 0.3-0.7 units/ml Monitor  platelets by anticoagulation protocol: Yes   Plan:  Continue heparin infusion at 1600 units/hr Check STAT aPTT Monitor CBC and for signs/symptoms of bleeding Follow up transition to Eliquis if PM CBC stable  Stephenie Acres, PharmD PGY1 Pharmacy Resident 02/12/2023 12:36 PM  ADDENDUM 2:48 PM APTT is 184, supratherapeutic with heparin drip at 1600 units/hr. Will hold infusion for 1 hour and restart at 1400 units/hr. Will check a 6 hour aPTT after restart.  Stephenie Acres, PharmD PGY1 Pharmacy Resident 02/12/2023 2:48 PM

## 2023-02-12 NOTE — Progress Notes (Signed)
Daily Progress Note Intern Pager: [phone]  Patient name: Joshua Hubbard Medical record number: [id number] Date of birth: 01-08-1976 Age: 48 y.o. Gender: male  Primary Care Provider: Nestor Ramp, MD Consultants: GI, IR, Heme/Onc  Code Status: Full Code   Pt Overview and Major Events to Date:  1/16: Admitted  Paracentesis 1/17: C diff positive  EGD  Assessment and Plan: Joshua Hubbard is a 48 y.o. male with hx of cirrhosis, hepatic encephalopathy, AUD, HTN presenting with BRBPR and abdominal swelling. Found to be C. Diff + (likely cause of BRBPR) , EGD demonstrated esophageal varices now s/p banding and portal hypertensive gastropathy. Suspect ascites 2/2 decompensated liver cirrhosis. Additionally pancytopenic likely 2/2 cirrhosis. Assessment & Plan Acute GI bleeding Resolving. Hgb stable 11.6. Likely 2/2 C.Diff colitis. S/p esophageal variceal banding. MRV abdomen: Positive for subacute to chronic thrombus within the main portal vein extending into the proximal SMV. The R&L portal veins & intrahepatic portal veins are diminutive. - GI consulted, appreciate recommendations - Soft diet x3 days (1/18-1/20) after variceal banding and then low-sodium diet thereafter - PPI for 8 weeks (1/18-3/15) - PO Vanc 125 mg QID (1/17-1/27) - IV heparin gtt, recheck PM CBC and then can transition to Eliquis per pharm - AM CBC w/ diff, CMP, Mg - replete electrolytes as indicated Cirrhosis of liver with ascites (HCC) SBP labs negative. Blood and body cx NGTD. Ascites remains improved s/p paracentesis. T. Bili remains elevated. Evidence of poral HTN. May benefit from SBP prophylaxis with CTX. Will base lactulose on his bowel movements. - GI consulted, appreciate recommendations - IR paracentesis ordered - Albumin repletion dependent on amount removed by paracentesis; will consider repleting 6-8g/L removed due to concern for SBP - Oxycodone 10 mg TID PRN for pain - Tylenol 500 mg q6 PRN for  fever, could consider IV Tylenol if NPO - f/u AFP - Continue Acamprosate 333 mg TID, Lactulose 10g TID, Lasix 20 mg daily - Restarted Propanolol 10 mg BID Pancytopenia (HCC) WBC 4.2 today. Neutropenia resolved. Started B12 supplementation per heme recs. On heparin gtt.  - CBC 4 PM - Heme consulted, appreciate recommendations - Transfuse PRBC for Hgb <7.0.  - Transfuse platelets for counts <20K or <50 K with active bleeding.  Murmur New murmur heard on exam - possibly in the setting of anemia however due to new onset b/l ankle edema possible ?HF. - Echo Urinary hesitancy Reports hesitancy with urination, no dysuria or hematuria. - Strict I/O - PVR Hypokalemia K 3.2 - Klor-Con 40 meq q6h daily x 2 doses  - Monitor with CMP  Chronic and Stable Problems: AUD - Pt denies alcohol use since 2021, continue acamprosate. CIWAs without ativan. Chronic pain - Oxycodone 15 mg BID prn. Would consider decreasing dose as patient does appear to be cognitively slowed.  FEN/GI: Soft diet PPx: SCDs Dispo: Pending clinical improvement  Subjective:  Patient is doing well this morning.  Has no major concerns other than his ankle edema.  Discussed that since he is on Lasix this will likely improve.  Was pretty active overnight.  Reports that his pain is moderated by his medicine however wears off.  Was wondering if he could get a paracentesis today.   Objective: Temp:  [98.8 F (37.1 C)-100.2 F (37.9 C)] 98.8 F (37.1 C) (01/19 0438) Pulse Rate:  [113-118] 118 (01/18 1122) Resp:  [18-19] 18 (01/19 0438) BP: (110-128)/(51-75) 119/59 (01/19 0438) SpO2:  [92 %-98 %] 92 % (01/19 0438) Weight:  [  81.2 kg] 81.2 kg (01/19 0542)  Physical Exam: General: Resting comfortably in bed in NAD Cardiovascular: RRR.  Soft systolic murmur heard in the RUSB.  No R/G. Respiratory: CTAB.  No increased WOB on RA.  No wheezing, rhonchi, crackles or diminished breath sounds. Abdomen: Soft, distended, TTP over  midline from xiphoid process to umbilicus.  Normoactive bowel sounds. Extremities: Bilateral ankle edema.  Able to move all extremities  Laboratory: Most recent CBC Lab Results  Component Value Date   WBC 4.2 02/12/2023   HGB 10.4 (L) 02/12/2023   HCT 29.1 (L) 02/12/2023   MCV 96.0 02/12/2023   PLT 90 (L) 02/12/2023   Most recent BMP    Latest Ref Rng & Units 02/12/2023    2:55 AM  BMP  Glucose 70 - 99 mg/dL 782   BUN 6 - 20 mg/dL <5   Creatinine 9.56 - 1.24 mg/dL 2.13   Sodium 086 - 578 mmol/L 125   Potassium 3.5 - 5.1 mmol/L 3.2   Chloride 98 - 111 mmol/L 95   CO2 22 - 32 mmol/L 20   Calcium 8.9 - 10.3 mg/dL 7.6    Mg: 1.4 ANC: 2.2  Imaging/Diagnostic Tests: No new imaging.  Fortunato Curling, DO 02/12/2023, 7:09 AM PGY-1, [facility]  FPTS Intern pager: [phone], text pages welcome Secure chat group [facility] Teaching Service

## 2023-02-12 NOTE — Assessment & Plan Note (Signed)
K 3.2 - Klor-Con 40 meq q6h daily x 2 doses  - Monitor with CMP

## 2023-02-12 NOTE — Anesthesia Postprocedure Evaluation (Signed)
Anesthesia Post Note  Patient: Alson Tiegs Mairena  Procedure(s) Performed: ESOPHAGOGASTRODUODENOSCOPY (EGD) ESOPHAGEAL BANDING     Patient location during evaluation: Endoscopy Anesthesia Type: MAC Level of consciousness: oriented, awake and alert and awake Pain management: pain level controlled Vital Signs Assessment: post-procedure vital signs reviewed and stable Respiratory status: spontaneous breathing, nonlabored ventilation, respiratory function stable and patient connected to nasal cannula oxygen Cardiovascular status: blood pressure returned to baseline and stable Postop Assessment: no headache, no backache and no apparent nausea or vomiting Anesthetic complications: no   No notable events documented.  Last Vitals:    Last Pain:                 Collene Schlichter

## 2023-02-12 NOTE — Assessment & Plan Note (Signed)
Reports hesitancy with urination, no dysuria or hematuria. - Strict I/O - PVR

## 2023-02-12 NOTE — Assessment & Plan Note (Addendum)
SBP labs negative. Blood and body cx NGTD. Ascites remains improved s/p paracentesis. T. Bili remains elevated. Evidence of poral HTN. May benefit from SBP prophylaxis with CTX. Will base lactulose on his bowel movements. - GI consulted, appreciate recommendations - IR paracentesis ordered - Albumin repletion dependent on amount removed by paracentesis; will consider repleting 6-8g/L removed due to concern for SBP - Oxycodone 10 mg TID PRN for pain - Tylenol 500 mg q6 PRN for fever, could consider IV Tylenol if NPO - f/u AFP - Continue Acamprosate 333 mg TID, Lactulose 10g TID, Lasix 20 mg daily - Restarted Propanolol 10 mg BID

## 2023-02-12 NOTE — Progress Notes (Signed)
Gastroenterology Inpatient Follow Up    Subjective: Patient has still been passing stools today.  He has been able to get up and walk around.  Radiology attempted to perform a paracentesis today, but there was only minimal ascites noted.  Thus paracentesis was aborted.  Patient denies blood in the stools.  Objective: Vital signs in last 24 hours: Temp:  [98.7 F (37.1 C)-100 F (37.8 C)] 98.8 F (37.1 C) (01/19 1700) Pulse Rate:  [98] 98 (01/19 1108) Resp:  [16-20] 16 (01/19 1700) BP: (102-144)/(51-74) 102/60 (01/19 1700) SpO2:  [92 %-96 %] 96 % (01/19 1700) Weight:  [81.2 kg] 81.2 kg (01/19 0542) Last BM Date : 02/10/23  Intake/Output from previous day: 01/18 0701 - 01/19 0700 In: 240 [P.O.:240] Out: -  Intake/Output this shift: No intake/output data recorded.  General appearance: alert and cooperative Resp: No increased work of breathing Cardio: Regular rate GI: Distended, mildly tender to palpation Extremities: No BLE edema  Lab Results: Recent Labs    02/11/23 0235 02/12/23 0255 02/12/23 1610  WBC 3.9* 4.2 4.3  HGB 11.6* 10.4* 11.1*  HCT 32.2* 29.1* 31.3*  PLT 98* 90* 89*   BMET Recent Labs    02/10/23 0244 02/11/23 0235 02/12/23 0255  NA 132* 129* 125*  K 3.6 3.0* 3.2*  CL 101 99 95*  CO2 24 22 20*  GLUCOSE 101* 115* 129*  BUN 9 8 <5*  CREATININE 0.68 0.73 0.81  CALCIUM 7.8* 8.0* 7.6*   LFT Recent Labs    02/12/23 0255  PROT 5.8*  ALBUMIN 2.4*  AST 49*  ALT 22  ALKPHOS 62  BILITOT 6.7*   PT/INR Recent Labs    02/10/23 0244  LABPROT 23.7*  INR 2.1*   Hepatitis Panel Recent Labs    02/11/23 0758  HEPBSAG NON REACTIVE  HCVAB NON REACTIVE  HEPAIGM NON REACTIVE  HEPBIGM NON REACTIVE   C-Diff Recent Labs    02/10/23 0524  CDIFFTOX POSITIVE*    Studies/Results: ECHOCARDIOGRAM COMPLETE Result Date: 02/12/2023    ECHOCARDIOGRAM REPORT   Patient Name:   Joshua Hubbard Date of Exam: 02/12/2023 Medical Rec #:  [id number]        Height:       69.0 in Accession #:    [id number]      Weight:       179.1 lb Date of Birth:  1975-09-16        BSA:          1.971 m Patient Age:    48 years        BP:           115/68 mmHg Patient Gender: M               HR:           75 bpm. Exam Location:  Inpatient Procedure: 2D Echo, Cardiac Doppler and Color Doppler Indications:    Murmur R01.1  History:        Patient has no prior history of Echocardiogram examinations.                 Risk Factors:Hypertension and ETOH abuse. Ascites due to                 alcoholic cirrhosis (HCC).  Sonographer:    Celesta Gentile RCS Referring Phys: [id number] Theador Hawthorne ENIOLA IMPRESSIONS  1. Left ventricular ejection fraction, by estimation, is 60 to 65%. The left ventricle has normal function. The left  ventricle has no regional wall motion abnormalities. Left ventricular diastolic parameters were normal.  2. Right ventricular systolic function is normal. The right ventricular size is mildly enlarged.  3. Left atrial size was mild to moderately dilated.  4. The mitral valve is normal in structure. No evidence of mitral valve regurgitation. No evidence of mitral stenosis.  5. The aortic valve is tricuspid. Aortic valve regurgitation is not visualized. No aortic stenosis is present.  6. Aortic dilatation noted. There is mild dilatation of the aortic root, measuring 41 mm.  7. The inferior vena cava is normal in size with greater than 50% respiratory variability, suggesting right atrial pressure of 3 mmHg. FINDINGS  Left Ventricle: Left ventricular ejection fraction, by estimation, is 60 to 65%. The left ventricle has normal function. The left ventricle has no regional wall motion abnormalities. The left ventricular internal cavity size was normal in size. There is  no left ventricular hypertrophy. Left ventricular diastolic parameters were normal. Right Ventricle: The right ventricular size is mildly enlarged. No increase in right ventricular wall thickness. Right ventricular  systolic function is normal. Left Atrium: Left atrial size was mild to moderately dilated. Right Atrium: Right atrial size was normal in size. Pericardium: There is no evidence of pericardial effusion. Mitral Valve: The mitral valve is normal in structure. No evidence of mitral valve regurgitation. No evidence of mitral valve stenosis. Tricuspid Valve: The tricuspid valve is normal in structure. Tricuspid valve regurgitation is not demonstrated. No evidence of tricuspid stenosis. Aortic Valve: The aortic valve is tricuspid. Aortic valve regurgitation is not visualized. No aortic stenosis is present. Pulmonic Valve: The pulmonic valve was normal in structure. Pulmonic valve regurgitation is not visualized. No evidence of pulmonic stenosis. Aorta: Aortic dilatation noted. There is mild dilatation of the aortic root, measuring 41 mm. Venous: The inferior vena cava is normal in size with greater than 50% respiratory variability, suggesting right atrial pressure of 3 mmHg. IAS/Shunts: No atrial level shunt detected by color flow Doppler.  LEFT VENTRICLE PLAX 2D LVIDd:         4.80 cm   Diastology LVIDs:         2.70 cm   LV e' medial:    11.20 cm/s LV PW:         1.00 cm   LV E/e' medial:  9.0 LV IVS:        1.00 cm   LV e' lateral:   9.90 cm/s LVOT diam:     2.10 cm   LV E/e' lateral: 10.2 LV SV:         105 LV SV Index:   53 LVOT Area:     3.46 cm  RIGHT VENTRICLE RV S prime:     19.00 cm/s TAPSE (M-mode): 2.4 cm LEFT ATRIUM             Index        RIGHT ATRIUM           Index LA diam:        3.90 cm 1.98 cm/m   RA Area:     19.40 cm LA Vol (A2C):   68.4 ml 34.70 ml/m  RA Volume:   58.40 ml  29.62 ml/m LA Vol (A4C):   66.7 ml 33.84 ml/m LA Biplane Vol: 67.8 ml 34.39 ml/m  AORTIC VALVE LVOT Vmax:   158.00 cm/s LVOT Vmean:  101.000 cm/s LVOT VTI:    0.303 m  AORTA Ao Root diam: 4.10 cm MITRAL VALVE  MV Area (PHT): 2.86 cm     SHUNTS MV E velocity: 101.00 cm/s  Systemic VTI:  0.30 m MV A velocity: 57.80 cm/s    Systemic Diam: 2.10 cm MV E/A ratio:  1.75 Mihai Croitoru MD Electronically signed by Thurmon Fair MD Signature Date/Time: 02/12/2023/4:37:14 PM    Final    US Abdomen Limited Result Date: 02/12/2023 CLINICAL DATA:  [id number] Ascites [id number] EXAM: LIMITED ABDOMEN ULTRASOUND FOR ASCITES TECHNIQUE: Limited ultrasound survey for ascites was performed in all four abdominal quadrants. COMPARISON:  US Abdomen, 02/09/2023.  CT AP, 02/08/2023. FINDINGS: Focused ultrasound along the abdominal quadrants. Evaluation demonstrating splenomegaly and trace volume intra-abdominal ascites, along the perihepatic space. Planned paracentesis was not performed IMPRESSION: Trace intra-abdominal ascites. Paracentesis was NOT performed Roanna Banning, MD [facility] Electronically Signed   By: Roanna Banning M.D.   On: 02/12/2023 15:05    Medications: I have reviewed the patient's current medications. Scheduled:  acamprosate  333 mg Oral TID   aMILoride  10 mg Oral Daily   vitamin B-12  1,000 mcg Oral Daily   furosemide  20 mg Oral Daily   lactulose  10 g Oral TID   pantoprazole (PROTONIX) IV  40 mg Intravenous BID   propranolol  10 mg Oral BID   vancomycin  125 mg Oral QID   Continuous:  cefTRIAXone (ROCEPHIN)  IV 2 g (02/12/23 1410)   QMV:[id number], ondansetron, oxyCODONE  Assessment/Plan: 48 year old male with history of alcoholic cirrhosis with esophageal varices, encephalopathy, and ascites presented with abdominal pain, fevers, and hematochezia. Patient has also had confusion, nausea, vomiting, and diarrhea.  C. difficile was positive, which may have been a source of his decompensation.  Patient had a paracentesis done earlier in this hospitalization that was negative for SBP.  Patient had an EGD done 1/17 with banding of his large non-bleeding esophageal varices and oozing PHG. Patient's rectal bleeding is favored to be due to C. difficile colitis.  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  Repeat paracentesis was attempted today, but was not able to be performed due to lack of ascites fluid.  I would favor that his abdominal distention is most likely due to potential gas accumulation.  I recommended that his hospital team discontinue his narcotics to try to reduce his abdominal distention.  -Soft diet for 3 days after variceal banding.  Low-sodium diet <2  gram per day -Continue oral diuretics including Lasix 20 mg daily and amiloride 10 mg daily.  Patient previously developed gynecomastia on spironolactone -PPI twice daily for 8 weeks to help with oozing PHG seen on EGD -Continue antibiotics for SBP prophylaxis for a total of 7 days in the setting of recent hematochezia. -Hematology has discussed the risk and benefits of anticoagulation with the patient, and have recommended no anticoagulation for his PVT/SMV thrombus -Continue lactulose -Continue p.o. vancomycin for treatment of C. Difficile -Patient will need to be restarted on propranolol prior to discharge -I contacted the patient's hospital team to let them know about decreasing the patient's opioid therapy since I think patient may be developing worsened abdominal distention as a result of gas accumulation -Repeat EGD indicated in in 1-2 months for variceal banding. We will arrange for his outpatient EGD for variceal banding. -Patient will also need follow-up with Annamarie Major his hepatologist. -GI will sign off for now.  Please call back if any new questions arise.   LOS: 4 days   Imogene Burn 02/12/2023, 7:08 PM

## 2023-02-12 NOTE — Assessment & Plan Note (Addendum)
Resolving. Hgb stable 11.6. Likely 2/2 C.Diff colitis. S/p esophageal variceal banding. MRV abdomen: Positive for subacute to chronic thrombus within the main portal vein extending into the proximal SMV. The R&L portal veins & intrahepatic portal veins are diminutive. - GI consulted, appreciate recommendations - Soft diet x3 days (1/18-1/20) after variceal banding and then low-sodium diet thereafter - PPI for 8 weeks (1/18-3/15) - PO Vanc 125 mg QID (1/17-1/27) - IV heparin gtt, recheck PM CBC and then can transition to Eliquis per pharm - AM CBC w/ diff, CMP, Mg - replete electrolytes as indicated

## 2023-02-12 NOTE — Assessment & Plan Note (Signed)
New murmur heard on exam - possibly in the setting of anemia however due to new onset b/l ankle edema possible ?HF. - Echo

## 2023-02-12 NOTE — Plan of Care (Signed)

## 2023-02-13 ENCOUNTER — Other Ambulatory Visit (HOSPITAL_COMMUNITY): Payer: Self-pay

## 2023-02-13 DIAGNOSIS — E876 Hypokalemia: Secondary | ICD-10-CM | POA: Diagnosis not present

## 2023-02-13 DIAGNOSIS — K7031 Alcoholic cirrhosis of liver with ascites: Secondary | ICD-10-CM | POA: Diagnosis not present

## 2023-02-13 DIAGNOSIS — D61818 Other pancytopenia: Secondary | ICD-10-CM | POA: Diagnosis not present

## 2023-02-13 LAB — CBC WITH DIFFERENTIAL/PLATELET
Abs Immature Granulocytes: 0.08 10*3/uL — ABNORMAL HIGH (ref 0.00–0.07)
Basophils Absolute: 0 10*3/uL (ref 0.0–0.1)
Basophils Relative: 1 %
Eosinophils Absolute: 0.1 10*3/uL (ref 0.0–0.5)
Eosinophils Relative: 3 %
HCT: 27.6 % — ABNORMAL LOW (ref 39.0–52.0)
Hemoglobin: 9.8 g/dL — ABNORMAL LOW (ref 13.0–17.0)
Immature Granulocytes: 2 %
Lymphocytes Relative: 10 %
Lymphs Abs: 0.5 10*3/uL — ABNORMAL LOW (ref 0.7–4.0)
MCH: 34.4 pg — ABNORMAL HIGH (ref 26.0–34.0)
MCHC: 35.5 g/dL (ref 30.0–36.0)
MCV: 96.8 fL (ref 80.0–100.0)
Monocytes Absolute: 1 10*3/uL (ref 0.1–1.0)
Monocytes Relative: 22 %
Neutro Abs: 2.9 10*3/uL (ref 1.7–7.7)
Neutrophils Relative %: 62 %
Platelets: 86 10*3/uL — ABNORMAL LOW (ref 150–400)
RBC: 2.85 MIL/uL — ABNORMAL LOW (ref 4.22–5.81)
RDW: 18.2 % — ABNORMAL HIGH (ref 11.5–15.5)
WBC: 4.7 10*3/uL (ref 4.0–10.5)
nRBC: 0 % (ref 0.0–0.2)

## 2023-02-13 LAB — COMPREHENSIVE METABOLIC PANEL
ALT: 20 U/L (ref 0–44)
AST: 47 U/L — ABNORMAL HIGH (ref 15–41)
Albumin: 2.2 g/dL — ABNORMAL LOW (ref 3.5–5.0)
Alkaline Phosphatase: 56 U/L (ref 38–126)
Anion gap: 6 (ref 5–15)
BUN: <5 mg/dL — ABNORMAL LOW (ref 6–20)
CO2: 23 mmol/L (ref 22–32)
Calcium: 7.7 mg/dL — ABNORMAL LOW (ref 8.9–10.3)
Chloride: 99 mmol/L (ref 98–111)
Creatinine, Ser: 0.66 mg/dL (ref 0.61–1.24)
GFR, Estimated: >60 mL/min (ref >60)
Glucose, Bld: 97 mg/dL (ref 70–99)
Potassium: 3.4 mmol/L — ABNORMAL LOW (ref 3.5–5.1)
Sodium: 128 mmol/L — ABNORMAL LOW (ref 135–145)
Total Bilirubin: 5.9 mg/dL — ABNORMAL HIGH (ref 0.0–1.2)
Total Protein: 5.4 g/dL — ABNORMAL LOW (ref 6.5–8.1)

## 2023-02-13 LAB — CYTOLOGY - NON PAP

## 2023-02-13 LAB — MAGNESIUM: Magnesium: 1.6 mg/dL — ABNORMAL LOW (ref 1.7–2.4)

## 2023-02-13 LAB — AFP TUMOR MARKER: AFP, Serum, Tumor Marker: 3.1 ng/mL (ref 0.0–6.9)

## 2023-02-13 MED ORDER — CYANOCOBALAMIN 1000 MCG PO TABS
1000.0000 ug | ORAL_TABLET | Freq: Every day | ORAL | 0 refills | Status: AC
  Filled 2023-02-13: qty 30, 30d supply, fill #0

## 2023-02-13 MED ORDER — VANCOMYCIN HCL 125 MG PO CAPS
125.0000 mg | ORAL_CAPSULE | Freq: Four times a day (QID) | ORAL | 0 refills | Status: AC
  Filled 2023-02-13: qty 28, 7d supply, fill #0

## 2023-02-13 MED ORDER — POTASSIUM CHLORIDE CRYS ER 20 MEQ PO TBCR
40.0000 meq | EXTENDED_RELEASE_TABLET | Freq: Once | ORAL | Status: AC
  Administered 2023-02-13: 40 meq via ORAL
  Filled 2023-02-13: qty 2

## 2023-02-13 MED ORDER — PROPRANOLOL HCL 10 MG PO TABS
10.0000 mg | ORAL_TABLET | Freq: Two times a day (BID) | ORAL | 0 refills | Status: DC
  Filled 2023-02-13: qty 60, 30d supply, fill #0

## 2023-02-13 MED ORDER — CIPROFLOXACIN HCL 500 MG PO TABS
500.0000 mg | ORAL_TABLET | Freq: Every day | ORAL | 0 refills | Status: AC
Start: 2023-02-13 — End: 2023-02-15
  Filled 2023-02-13: qty 2, 2d supply, fill #0

## 2023-02-13 MED ORDER — FUROSEMIDE 20 MG PO TABS
20.0000 mg | ORAL_TABLET | Freq: Every day | ORAL | 0 refills | Status: DC
  Filled 2023-02-13: qty 30, 30d supply, fill #0

## 2023-02-13 MED ORDER — MAGNESIUM SULFATE IN D5W 1-5 GM/100ML-% IV SOLN
1.0000 g | Freq: Once | INTRAVENOUS | Status: AC
  Administered 2023-02-13: 1 g via INTRAVENOUS
  Filled 2023-02-13: qty 100

## 2023-02-13 MED ORDER — OXYCODONE HCL 5 MG PO TABS
5.0000 mg | ORAL_TABLET | Freq: Three times a day (TID) | ORAL | 0 refills | Status: DC | PRN
  Filled 2023-02-13: qty 20, 7d supply, fill #0

## 2023-02-13 MED ORDER — ACAMPROSATE CALCIUM 333 MG PO TBEC
333.0000 mg | DELAYED_RELEASE_TABLET | Freq: Three times a day (TID) | ORAL | 0 refills | Status: AC
  Filled 2023-02-13: qty 90, 30d supply, fill #0

## 2023-02-13 MED ORDER — AMILORIDE HCL 5 MG PO TABS
10.0000 mg | ORAL_TABLET | Freq: Every day | ORAL | 0 refills | Status: DC
  Filled 2023-02-13: qty 60, 30d supply, fill #0

## 2023-02-13 MED ORDER — OXYCODONE HCL 5 MG PO TABS: 5.0000 mg | ORAL_TABLET | Freq: Three times a day (TID) | ORAL | Status: DC | PRN

## 2023-02-13 NOTE — Assessment & Plan Note (Signed)
SBP labs negative. Blood and body cx NGTD. Ascites remains improved s/p paracentesis. T. Bili remains elevated. Evidence of poral HTN. May benefit from SBP prophylaxis with CTX. Will base lactulose on his bowel movements. (Good stool output yesterday). Unsuccessful paracentesis yesterday d/t low ascitic fluid noted.  - GI consulted, appreciate recommendations - Oxycodone 5 mg q8h PRN for pain; GI advised to limit narcotics as this could be causing gaseous distention/pain w/ low volume of ascites seen  - Tylenol 500 mg q6 PRN for fever, could consider IV Tylenol if NPO - f/u AFP - Continue Acamprosate 333 mg TID, Lactulose 10g TID, Lasix 20 mg daily, Amiloride 10 mg daily, and Propanolol 10 mg BID

## 2023-02-13 NOTE — Assessment & Plan Note (Signed)
New murmur heard on exam - possibly in the setting of anemia. Normal Echo with LVEF: 60-65%, no RWMA, and IVC > 50% respiratory variability.

## 2023-02-13 NOTE — Progress Notes (Addendum)
Daily Progress Note Intern Pager: [phone]  Patient name: Joshua Hubbard Medical record number: [id number] Date of birth: [birth year] Age: 48 y.o. Gender: male  Primary Care Provider: Nestor Ramp, MD Consultants: GI, IR, Heme/Onc  Code Status: Full Code   Pt Overview and Major Events to Date:  1/16: Admitted  Paracentesis 1/17: C diff positive  EGD 1/18: SBP ppx continued w/ CTX  Restarted Lasix and Amiloride 1/19: Murmur  Echo normal  Assessment and Plan: Joshua Hubbard is a 48 y.o. male with hx of cirrhosis, hepatic encephalopathy, AUD, HTN presenting with BRBPR and abdominal swelling. Found to be C. Diff + (likely cause of BRBPR) , EGD demonstrated esophageal varices now s/p banding and portal hypertensive gastropathy. Suspect ascites 2/2 decompensated liver cirrhosis. Additionally pancytopenic likely 2/2 cirrhosis and C. Diff, but improved.  Assessment & Plan Acute GI bleeding Hgb dropped from 11.1>9.8 today. Likely 2/2 C.Diff colitis. S/p esophageal variceal banding. MRV abdomen: Positive for subacute to chronic thrombus within the main portal vein extending into the proximal SMV. The R&L portal veins & intrahepatic portal veins are diminutive. - GI consulted, appreciate recommendations - Soft diet x3 days (1/18-1/20) after variceal banding and then low-sodium diet thereafter - PPI for 8 weeks (1/18-3/15); will switch to PO prior to DC - PO Vanc 125 mg QID (1/17-1/27) - Hematology weighed risks vs benefits and recommended no anticoagulation at this time for PVT/SMV - AM CBC w/ diff, CMP, Mg - replete electrolytes as indicated Cirrhosis of liver with ascites (HCC) SBP labs negative. Blood and body cx NGTD. Ascites remains improved s/p paracentesis. T. Bili remains elevated. Evidence of poral HTN. May benefit from SBP prophylaxis with CTX. Will base lactulose on his bowel movements. (Good stool output yesterday). Unsuccessful paracentesis yesterday d/t low ascitic fluid  noted.  - GI consulted, appreciate recommendations - Oxycodone 5 mg q8h PRN for pain; GI advised to limit narcotics as this could be causing gaseous distention/pain w/ low volume of ascites seen  - Tylenol 500 mg q6 PRN for fever, could consider IV Tylenol if NPO - f/u AFP - Continue Acamprosate 333 mg TID, Lactulose 10g TID, Lasix 20 mg daily, Amiloride 10 mg daily, and Propanolol 10 mg BID Pancytopenia (HCC) WBC 4.7 today. Neutropenia resolved (2.9). Started B12 supplementation per heme recs. D/c heparin gtt.  - Heme consulted, appreciate recommendations - Transfuse PRBC for Hgb <7.0.  - Transfuse platelets for counts <20K or <50 K with active bleeding.  Murmur New murmur heard on exam - possibly in the setting of anemia. Normal Echo with LVEF: 60-65%, no RWMA, and IVC > 50% respiratory variability. Urinary hesitancy Reports hesitancy with urination, no dysuria or hematuria. Urine output x4. - Strict I/O - PVR (0) Hypokalemia K 3.4. - Klor-Con 40 meq - Monitor with CMP  Chronic and Stable Problems:  AUD - Pt denies alcohol use since 2021, continue acamprosate. CIWAs (0) without ativan. Chronic pain - Oxycodone 15 mg BID prn at home. Decreased inpatient to limit gaseous distention and stool burden.   FEN/GI: Soft diet PPx: SCDs Dispo: Home today  Subjective:  Patient is doing well this morning.  He shared that his abdominal pain has significantly improved.  He has had 4 bowel movements yesterday with no blood.  His ankles are still swollen, but otherwise he feels like he is improving.  Feels like he would be ready to go home by tomorrow.  Objective: Temp:  [98.1 F (36.7 C)-99.6 F (37.6 C)]  99.6 F (37.6 C) (01/20 0429) Pulse Rate:  [69-98] 69 (01/20 0429) Resp:  [16-18] 18 (01/20 0429) BP: (102-119)/(59-68) 109/62 (01/20 0429) SpO2:  [95 %-96 %] 95 % (01/20 0429) Weight:  [78.5 kg] 78.5 kg (01/20 0108) Physical Exam: General: Ambulates without  discomfort. Cardiovascular: RRR.  No M/R/G. Respiratory: CTAB.  No increased WOB on RA.  No wheezing, rhonchi, crackles or diminished breath sounds. Abdomen: Soft, distended, non-tender.  Normoactive bowel sounds. Extremities: Bilateral ankle edema.  Able to move all extremities  Laboratory: Most recent CBC Lab Results  Component Value Date   WBC 4.7 02/13/2023   HGB 9.8 (L) 02/13/2023   HCT 27.6 (L) 02/13/2023   MCV 96.8 02/13/2023   PLT 86 (L) 02/13/2023   Most recent BMP    Latest Ref Rng & Units 02/13/2023    3:01 AM  BMP  Glucose 70 - 99 mg/dL 97   BUN 6 - 20 mg/dL <5   Creatinine 7.84 - 1.24 mg/dL 6.96   Sodium 295 - 284 mmol/L 128   Potassium 3.5 - 5.1 mmol/L 3.4   Chloride 98 - 111 mmol/L 99   CO2 22 - 32 mmol/L 23   Calcium 8.9 - 10.3 mg/dL 7.7    Mag: 1.6  Imaging/Diagnostic Tests: No new imaging.   Fortunato Curling, DO 02/13/2023, 10:34 AM PGY-1, [facility]  FPTS Intern pager: [phone], text pages welcome Secure chat group [facility] Teaching Service

## 2023-02-13 NOTE — Assessment & Plan Note (Addendum)
Hgb dropped from 11.1>9.8 today. Likely 2/2 C.Diff colitis. S/p esophageal variceal banding. MRV abdomen: Positive for subacute to chronic thrombus within the main portal vein extending into the proximal SMV. The R&L portal veins & intrahepatic portal veins are diminutive. - GI consulted, appreciate recommendations - Soft diet x3 days (1/18-1/20) after variceal banding and then low-sodium diet thereafter - PPI for 8 weeks (1/18-3/15); will switch to PO prior to DC - PO Vanc 125 mg QID (1/17-1/27) - Hematology weighed risks vs benefits and recommended no anticoagulation at this time for PVT/SMV - AM CBC w/ diff, CMP, Mg - replete electrolytes as indicated

## 2023-02-13 NOTE — Plan of Care (Signed)

## 2023-02-13 NOTE — Assessment & Plan Note (Signed)
WBC 4.7 today. Neutropenia resolved (2.9). Started B12 supplementation per heme recs. D/c heparin gtt.  - Heme consulted, appreciate recommendations - Transfuse PRBC for Hgb <7.0.  - Transfuse platelets for counts <20K or <50 K with active bleeding.

## 2023-02-13 NOTE — Discharge Instructions (Signed)
Dear Joshua Hubbard,  Thank you for letting us participate in your care. You were hospitalized for GI bleeding and abdominal pain in the setting of decompensated cirrhosis and C. Diff infection and diagnosed with Acute GI bleeding. You were treated with antibiotics, paracentesis, and new medications to help moderate your symptoms. We acquired specialist help including GI and hematology/oncology.  POST-HOSPITAL & CARE INSTRUCTIONS Please ensure you complete your abx - Vanc and ** Please continue all your new prescribed medications as listed on your medication list. Go to your follow up appointments (listed below)   DOCTOR'S APPOINTMENT   No future appointments.   Take care and be well!  Family Medicine Teaching Service Inpatient Team [place]  [facility]  [address] [place], [place] [address] [phone]

## 2023-02-13 NOTE — Assessment & Plan Note (Addendum)
K 3.4. - Klor-Con 40 meq - Monitor with CMP

## 2023-02-13 NOTE — TOC Transition Note (Signed)
Transition of Care [facility]) - Discharge Note   Patient Details  Name: Joshua Hubbard MRN: [id number] Date of Birth: Jul 15, 1975  Transition of Care [facility]) CM/SW Contact:  Leone Haven, RN Phone Number: 02/13/2023, 11:13 AM   Clinical Narrative:    Patient is for dc today, he states his daughter is going to transport him home today.     Final next level of care: Home/Self Care Barriers to Discharge: Continued Medical Work up   Patient Goals and CMS Choice Patient states their goals for this hospitalization and ongoing recovery are:: return home with daughters   Choice offered to / list presented to : NA      Discharge Placement                       Discharge Plan and Services Additional resources added to the After Visit Summary for   In-house Referral: NA Discharge Planning Services: CM Consult Post Acute Care Choice: NA            DME Agency: NA       HH Arranged: NA          Social Drivers of Health (SDOH) Interventions SDOH Screenings   Food Insecurity: No Food Insecurity (02/08/2023)  Housing: Low Risk  (02/08/2023)  Transportation Needs: No Transportation Needs (02/08/2023)  Utilities: Not At Risk (02/08/2023)  Depression (PHQ2-9): High Risk (01/11/2023)  Social Connections: Socially Isolated (02/08/2023)  Tobacco Use: Low Risk  (02/10/2023)     Readmission Risk Interventions     No data to display

## 2023-02-13 NOTE — Assessment & Plan Note (Addendum)
Reports hesitancy with urination, no dysuria or hematuria. Urine output x4. - Strict I/O - PVR (0)

## 2023-02-13 NOTE — Discharge Summary (Signed)
Family Medicine Teaching [facility] Discharge Summary  Patient name: Joshua Hubbard Medical record number: [id number] Date of birth: Nov 01, 1975 Age: 48 y.o. Gender: male Date of Admission: 02/08/2023  Date of Discharge: 02/13/23  Admitting Physician: Latrelle Dodrill, MD  Primary Care Provider: Nestor Ramp, MD Consultants: GI, IR, Heme/Onc  Indication for Hospitalization: Acute GIB  Discharge Diagnoses/Problem List:  Principal Problem for Admission: Acute GIB Other Problems addressed during stay:  Principal Problem:   Acute GI bleeding Active Problems:   Hypokalemia   Ascites due to alcoholic cirrhosis (HCC)   GI bleed   Cirrhosis of liver with ascites (HCC)   Pancytopenia (HCC)   Rectal bleeding   Murmur   Urinary hesitancy  Brief Hospital Course:  MONTERIUS UHL is a 48 y.o. with history of hx of cirrhosis, hepatic encephalopathy, AUD, HTN who was admitted for BRBPR and abdominal swelling.  Abdominal Pain  Decompensated Cirrhosis  C. diff Presented to the ED with BRBPR and abdominal pain, CT with potential thrombus in extrahepatic portal vein and upper SMV, and wall thickening of rectum and ascending colon, concerning for portal hypertensive colopathy. IR performed a diagnostic and therapeutic paracentesis which was negative for SBP. C diff positive, started on PO Vanc.  GI consulted and recommended EGD that showed large non-bleeding esophageal varices and oozing PHG and banding was performed. MRV abdomen positive for subacute to chronic throbus within the main portal vein extending into the proximal SMV. Hematology weighed the risks vs benefits of starting anticoagulation and decided against anticoagulation at this time due to chronicity of thrombus. R and L portal veins and intrahepatic portal veins diminutive. MELD 27. GI recommend PPI for [redacted] weeks along with IV CTX for SBP ppx for 7 days, transitioned to Ciprofloxacin upon discharge to complete course. Patient  was started on Lasix 20 mg daily and will continue home Eplerenone.   Pancytopenia Oncology consulted and thought this was likely due to cirrhosis. No further recommendations other than monitoring.  Other chronic conditions were medically managed with home medications and formulary alternatives as necessary: AUD - Pt denies alcohol use since 2021, continue acamprosate. CIWAs (0) without ativan. Chronic pain - Oxycodone 15 mg BID prn at home. Decreased inpatient to limit gaseous distention and stool burden. Prescribed Oxycodone 5 mg q8h PRN for severe pain upon discharge.  PCP Follow-up recommendations: Complete PO Vanc (1/17-1/27) PPI BID for 8 weeks (1/18-3/15) Ciprofloxacin should be completed on 1/23 EGD in 4 weeks (2/15) Consider adding Simethicone as needed  Disposition: Home  Discharge Condition: Stable  Discharge Exam:  Vitals:   02/13/23 0108 02/13/23 0429  BP: 119/67 109/62  Pulse: 80 69  Resp: 18 18  Temp: 98.8 F (37.1 C) 99.6 F (37.6 C)  SpO2: 95% 95%   General: Ambulates without discomfort. Cardiovascular: RRR.  No M/R/G. Respiratory: CTAB.  No increased WOB on RA.  No wheezing, rhonchi, crackles or diminished breath sounds. Abdomen: Soft, distended, non-tender.  Normoactive bowel sounds. Extremities: Bilateral ankle edema.  Able to move all extremities  Significant Procedures: IR Paracentesis Successful ultrasound-guided paracentesis yielding 2.5 liters of peritoneal fluid.   Significant Labs and Imaging:  Recent Labs  Lab 02/12/23 0255 02/12/23 1610 02/13/23 0301  WBC 4.2 4.3 4.7  HGB 10.4* 11.1* 9.8*  HCT 29.1* 31.3* 27.6*  PLT 90* 89* 86*   Recent Labs  Lab 02/12/23 0255 02/13/23 0301  NA 125* 128*  K 3.2* 3.4*  CL 95* 99  CO2 20* 23  GLUCOSE 129* 97  BUN <5* <5*  CREATININE 0.81 0.66  CALCIUM 7.6* 7.7*  MG 1.4* 1.6*  ALKPHOS 62 56  AST 49* 47*  ALT 22 20  ALBUMIN 2.4* 2.2*    CTA Abdomen Pelvis w & w/o contrast 1. Cirrhosis  with portal hypertension and moderate ascites. Potential exophytic nodule from the right lobe of the liver. 2. Gallstones. 3. Wall thickening of the rectum as well as ascending colon may be due to portal colopathy. Colitis could have a similar appearance.  US Liver Doppler 1. Cirrhosis with small volume ascites. 2. The SMV and portosplenic confluence is obscured by overlying bowel gas, limiting assessment for the possible venous thrombosis seen on recent CTA. Abdomen MRI/MRV may provide more definitive characterization if clinically indicated. 3. Splenomegaly.  MR MRV Abdomen w/ or w/o contrast 1. Positive for subacute to chronic thrombus within the main portal vein extending into the proximal SMV. The right and left portal veins and intrahepatic portal veins are diminutive. Patient likely in the process of undergoing cavernous transformation. Additionally, there is recanalization of the paraumbilical vein and increasing extensive portosystemic collaterals in the anterior abdomen and anterior abdominal wall. In retrospect, a small amount of nonocclusive thrombus was evidence in the main portal vein on the prior MRI from April of 2024. 2. Hepatic cirrhosis with splenomegaly and portal hypertension. 3. No acute arterial abnormality.  Results/Tests Pending at Time of Discharge: none  Discharge Medications:  Allergies as of 02/13/2023       Reactions   Spironolactone Other (See Comments)   Gynecomastia   Tramadol    Made him feel hot and shakey        Medication List     TAKE these medications    acamprosate 333 MG tablet Commonly known as: CAMPRAL Take 1 tablet (333 mg total) by mouth 3 (three) times daily. What changed: how much to take   ciprofloxacin 500 MG tablet Commonly known as: Cipro Take 1 tablet (500 mg total) by mouth daily with breakfast for 2 days. Starting morning of 02/14/2023   cyanocobalamin 1000 MCG tablet Take 1 tablet (1,000 mcg total) by mouth daily.    eplerenone 50 MG tablet Commonly known as: INSPRA Take 1 tablet (50 mg total) by mouth daily.   furosemide 20 MG tablet Commonly known as: LASIX Take 1 tablet (20 mg total) by mouth daily. What changed: when to take this   lactulose 10 GM/15ML solution Commonly known as: CHRONULAC TAKE 30 MLS (20 G TOTAL) BY MOUTH 3 TIMES DAILY.   lidocaine 5 % Commonly known as: LIDODERM Place 1 patch onto the skin daily. Remove & Discard patch within 12 hours or as directed by MD What changed:  when to take this reasons to take this additional instructions   ondansetron 4 MG tablet Commonly known as: ZOFRAN Take 1 tablet (4 mg total) by mouth every 8 (eight) hours as needed for nausea or vomiting.   oxyCODONE 5 MG immediate release tablet Commonly known as: Oxy IR/ROXICODONE Take 1 tablet (5 mg total) by mouth every 8 (eight) hours as needed for severe pain (pain score 7-10). What changed:  medication strength how much to take when to take this reasons to take this   pantoprazole 40 MG tablet Commonly known as: PROTONIX Take 1 tablet (40 mg total) by mouth 2 (two) times daily.   propranolol 10 MG tablet Commonly known as: INDERAL Take 1 tablet (10 mg total) by mouth 2 (two) times daily.  vancomycin 125 MG capsule Commonly known as: VANCOCIN Take 1 capsule (125 mg total) by mouth 4 (four) times daily.        Discharge Instructions: Please refer to Patient Instructions section of EMR for full details.  Patient was counseled important signs and symptoms that should prompt return to medical care, changes in medications, dietary instructions, activity restrictions, and follow up appointments.   Follow-Up Appointments:  Follow-up Information     Nestor Ramp, MD Follow up on 02/15/2023.   Specialties: Family Medicine, Sports Medicine Why: 1:50 PM Contact information: [address] [place] [address] [phone]                 Fortunato Curling,  DO 02/13/2023, 11:03 AM PGY-1, [facility]

## 2023-02-14 ENCOUNTER — Telehealth: Payer: Self-pay

## 2023-02-14 LAB — CULTURE, BLOOD (ROUTINE X 2)
Culture: NO GROWTH
Culture: NO GROWTH
Special Requests: ADEQUATE

## 2023-02-14 LAB — CULTURE, BODY FLUID W GRAM STAIN -BOTTLE: Culture: NO GROWTH

## 2023-02-14 NOTE — Transitions of Care (Post Inpatient/ED Visit) (Signed)
   02/14/2023  Name: Joshua Hubbard MRN: [id number] DOB: [birth year]  Today's TOC FU Call Status: Today's TOC FU Call Status:: Unsuccessful Call (1st Attempt) Unsuccessful Call (1st Attempt) Date: 02/14/23  Attempted to reach the patient regarding the most recent Inpatient/ED visit.  Follow Up Plan: Additional outreach attempts will be made to reach the patient to complete the Transitions of Care (Post Inpatient/ED visit) call.   Signature Karena Addison, LPN [facility] Nurse Health Advisor Direct Dial [phone]

## 2023-02-15 ENCOUNTER — Inpatient Hospital Stay: Payer: BLUE CROSS/BLUE SHIELD | Admitting: Family Medicine

## 2023-02-16 LAB — CBC WITH DIFFERENTIAL/PLATELET
Abs Immature Granulocytes: 0.02 10*3/uL (ref 0.00–0.07)
Basophils Absolute: 0 10*3/uL (ref 0.0–0.1)
Basophils Relative: 1 %
Eosinophils Absolute: 0 10*3/uL (ref 0.0–0.5)
Eosinophils Relative: 3 %
HCT: 32.7 % — ABNORMAL LOW (ref 39.0–52.0)
Hemoglobin: 11.7 g/dL — ABNORMAL LOW (ref 13.0–17.0)
Immature Granulocytes: 2 %
Lymphocytes Relative: 28 %
Lymphs Abs: 0.3 10*3/uL — ABNORMAL LOW (ref 0.7–4.0)
MCH: 34.2 pg — ABNORMAL HIGH (ref 26.0–34.0)
MCHC: 35.8 g/dL (ref 30.0–36.0)
MCV: 95.6 fL (ref 80.0–100.0)
Monocytes Absolute: 0.2 10*3/uL (ref 0.1–1.0)
Monocytes Relative: 18 %
Neutro Abs: 0.6 10*3/uL — ABNORMAL LOW (ref 1.7–7.7)
Neutrophils Relative %: 48 %
Platelets: 67 10*3/uL — ABNORMAL LOW (ref 150–400)
RBC: 3.42 MIL/uL — ABNORMAL LOW (ref 4.22–5.81)
RDW: 16.6 % — ABNORMAL HIGH (ref 11.5–15.5)
Smear Review: DECREASED
WBC: 1.2 10*3/uL — CL (ref 4.0–10.5)
nRBC: 0 % (ref 0.0–0.2)

## 2023-02-17 ENCOUNTER — Telehealth: Payer: Self-pay

## 2023-02-17 NOTE — Telephone Encounter (Signed)
Left message for pt to call back

## 2023-02-17 NOTE — Telephone Encounter (Signed)
-----   Message from Stan Head sent at 02/15/2023  3:36 PM EST ----- Thanks Alan Ripper (he had not scheduled a recommended f/u)  Viviann Spare - please arrange an EGD in March (2 dates 3/6 - [facility] and 3/27 [facility] available)  CEG ----- Message ----- From: Imogene Burn, MD Sent: 02/12/2023   7:19 PM EST To: Iva Boop, MD  [facility], I saw your patient [name] in the hospital.  Patient came in with rectal bleeding, abdominal pain, and fevers.  He was found to have C. difficile.  I performed an EGD to rule out variceal bleed.  Patient did not have stigmata of recent variceal bleeding, but he did have large esophageal varices.  Thus I opted to band during this hospitalization.  He is recovering currently, but he will need another EGD in 1 to 2 months for repeat look and potentially more banding.  Would you be able to help arrange for his follow-up EGD?  He follows with Annamarie Major for his hepatology care otherwise.  Thanks,  Alan Ripper

## 2023-02-20 NOTE — Telephone Encounter (Signed)
Left message for pt to call back

## 2023-02-21 NOTE — Transitions of Care (Post Inpatient/ED Visit) (Signed)
   02/21/2023  Name: Joshua Hubbard MRN: [id number] DOB: Sep 14, 1975 Patient already seen in office Today's TOC FU Call Status: Today's TOC FU Call Status:: Unsuccessful Call (1st Attempt) Unsuccessful Call (1st Attempt) Date: 02/14/23  Attempted to reach the patient regarding the most recent Inpatient/ED visit.  Follow Up Plan: No further outreach attempts will be made at this time. We have been unable to contact the patient.  Signature Karena Addison, LPN [facility] Nurse Health Advisor Direct Dial [phone]

## 2023-02-21 NOTE — Telephone Encounter (Signed)
Left message for pt to call back

## 2023-02-21 NOTE — Telephone Encounter (Signed)
Inbound call from patient returning phone call. Requesting call back. Please advise, thank you.

## 2023-02-22 NOTE — Telephone Encounter (Signed)
Left message for pt to call back

## 2023-02-23 NOTE — Telephone Encounter (Signed)
Left message for patient to call back

## 2023-02-24 NOTE — Telephone Encounter (Signed)
 Left message for pt to call back

## 2023-02-27 ENCOUNTER — Other Ambulatory Visit: Payer: Self-pay | Admitting: Family Medicine

## 2023-02-27 ENCOUNTER — Other Ambulatory Visit: Payer: Self-pay

## 2023-02-27 DIAGNOSIS — K3189 Other diseases of stomach and duodenum: Secondary | ICD-10-CM

## 2023-02-27 DIAGNOSIS — K7031 Alcoholic cirrhosis of liver with ascites: Secondary | ICD-10-CM

## 2023-02-27 DIAGNOSIS — I8500 Esophageal varices without bleeding: Secondary | ICD-10-CM

## 2023-02-27 NOTE — Telephone Encounter (Signed)
Pt made aware of Dr. Leone Payor recommendations. Pt was scheduled for the EGD at [facility] on 03/30/2023 at 10:00 AM with Dr. Leone Payor. Pt made aware. Case ID [id number] Ambulatory referral to GI placed in Epic. Pt made aware. Prep instructions were sent to pt via my chart. Pt made aware. Pt verbalized understanding with all questions answered.

## 2023-02-27 NOTE — Progress Notes (Signed)
Spoke with [employer].  He is feeling like his abdomen is full again and thinks he needs another paracentesis which I have ordered.  He is also having a bit of a sore throat because he has been coughing so much at night.  Not sure what that is about.  No fever.  I recommended over-the-counter cough drops and hot tea with honey.  He needs to make a follow-up appoint with me in the clinic in the next week or so.

## 2023-02-28 ENCOUNTER — Ambulatory Visit (HOSPITAL_COMMUNITY)
Admission: RE | Admit: 2023-02-28 | Discharge: 2023-02-28 | Disposition: A | Discharge status: Home / Self Care | Payer: BLUE CROSS/BLUE SHIELD | Source: Ambulatory Visit | Attending: Family Medicine | Admitting: Family Medicine

## 2023-02-28 DIAGNOSIS — K3189 Other diseases of stomach and duodenum: Secondary | ICD-10-CM | POA: Insufficient documentation

## 2023-02-28 DIAGNOSIS — K7031 Alcoholic cirrhosis of liver with ascites: Secondary | ICD-10-CM | POA: Insufficient documentation

## 2023-02-28 DIAGNOSIS — K766 Portal hypertension: Secondary | ICD-10-CM | POA: Insufficient documentation

## 2023-02-28 HISTORY — PX: IR PARACENTESIS: IMG2679

## 2023-02-28 MED ORDER — LIDOCAINE HCL 1 % IJ SOLN
INTRAMUSCULAR | Status: AC
  Filled 2023-02-28: qty 20

## 2023-02-28 MED ORDER — LIDOCAINE HCL (PF) 1 % IJ SOLN: 10.0000 mL | Freq: Once | INTRAMUSCULAR | Status: DC

## 2023-02-28 NOTE — Procedures (Signed)
 PROCEDURE SUMMARY:  Successful image-guided therapeutic paracentesis from the right abdomen.  Yielded 4.7 liters of clear, straw-colored peritoneal fluid.  No immediate complications.  EBL: zero Patient tolerated well.   Please see imaging section of Epic for full dictation.  Carlin LABOR Margi Edmundson PA-C 02/28/2023 10:37 AM

## 2023-03-02 ENCOUNTER — Ambulatory Visit: Payer: BLUE CROSS/BLUE SHIELD | Admitting: Student

## 2023-03-05 ENCOUNTER — Other Ambulatory Visit: Payer: Self-pay | Admitting: Family Medicine

## 2023-03-05 MED ORDER — HYDROCODONE-ACETAMINOPHEN 5-325 MG PO TABS: 1.0000 | ORAL_TABLET | Freq: Four times a day (QID) | ORAL | 0 refills | Status: AC | PRN

## 2023-03-06 ENCOUNTER — Ambulatory Visit: Payer: BLUE CROSS/BLUE SHIELD

## 2023-03-14 ENCOUNTER — Other Ambulatory Visit: Payer: Self-pay | Admitting: Family Medicine

## 2023-03-14 DIAGNOSIS — K7030 Alcoholic cirrhosis of liver without ascites: Secondary | ICD-10-CM

## 2023-03-16 ENCOUNTER — Other Ambulatory Visit: Payer: Self-pay | Admitting: Family Medicine

## 2023-03-16 MED ORDER — OXYCODONE HCL 5 MG PO TABS: 5.0000 mg | ORAL_TABLET | Freq: Three times a day (TID) | ORAL | 0 refills | Status: DC | PRN

## 2023-03-16 NOTE — Progress Notes (Signed)
10.4065/[id number]  A useful reference for pain management in hepatic failure/insufficiency  [name] sent me a picture of a bruised flank and I asked him to get labs. He also said he was having pain. Today he said pain meds I sent in last time not helping. I had switched him to hydrocodone from oxycodone. Reviewed articles/sources for best options in his situation. Reduced dosing of Hydromorphone, fentanyl and methadone may be better.  After discussion with him, we will try a very reduced dose of oxycodone (5 mg tabs). He has not gotten his blood work done yet partly because of inclement weather. I asked him to get it done later today if roads clear and certainly no later than AM tomorrow.

## 2023-03-20 ENCOUNTER — Telehealth: Payer: Self-pay | Admitting: Gastroenterology

## 2023-03-20 NOTE — Telephone Encounter (Addendum)
 Procedure:EGD Procedure date: 03/30/23 Procedure location: [facility] Arrival Time: 8:30 am Spoke with the patient Y/N: Yes Any prep concerns? No  Has the patient obtained the prep from the pharmacy ? No prep needed Do you have a care partner and transportation: Yes Any additional concerns? No   I left a detailed message for the patient to return call  03/20/23

## 2023-03-22 ENCOUNTER — Other Ambulatory Visit: Payer: Self-pay | Admitting: Family Medicine

## 2023-03-23 ENCOUNTER — Encounter (HOSPITAL_COMMUNITY): Payer: Self-pay | Admitting: Internal Medicine

## 2023-03-27 ENCOUNTER — Telehealth: Payer: Self-pay

## 2023-03-27 NOTE — Telephone Encounter (Signed)
 Pharmacy Patient Advocate Encounter   Received notification from CoverMyMeds that prior authorization for LIDOCAINE 5% PATCHES is required/requested.   Insurance verification completed.   The patient is insured through [facility] .   PA required; PA submitted to above mentioned insurance via CoverMyMeds Key/confirmation #/EOC [id number]. Status is pending

## 2023-03-27 NOTE — Telephone Encounter (Signed)
 Pharmacy Patient Advocate Encounter  Received notification from [facility] that Prior Authorization for LIDOCAINE 5% PATCHES has been DENIED.  No reason given; No denial letter received via Fax or CMM. It has been requested and will be uploaded to the media tab once received.  LIDOCAINE 4% PATCHES are available OTC.  Also, what is the exact diagnosis/code for the medication?  PA #/Case ID/Reference #: [id number]

## 2023-03-28 ENCOUNTER — Telehealth: Payer: Self-pay | Admitting: Internal Medicine

## 2023-03-28 NOTE — Telephone Encounter (Signed)
 It looks like the case is still pending for this pt's EGD to have the procedure done at the hospital since [facility] is appearing to be out of network and the pt's healthplan does not allow the use of an out of network provider. I called the pt and left a msg on his vm.

## 2023-03-28 NOTE — Telephone Encounter (Signed)
 Left message for pt to call back

## 2023-03-28 NOTE — Telephone Encounter (Signed)
 Patient returned call, wishing to give you an update on insurance.

## 2023-03-28 NOTE — Telephone Encounter (Signed)
 Spoke with Pt. Pt stated that he was going to reach out to his insurance company. Pt was notified that I would call him back today at 4:00 PM to check on the status of things

## 2023-03-29 ENCOUNTER — Ambulatory Visit (HOSPITAL_COMMUNITY)
Admission: RE | Admit: 2023-03-29 | Discharge: 2023-03-29 | Disposition: A | Discharge status: Home / Self Care | Payer: BLUE CROSS/BLUE SHIELD | Source: Ambulatory Visit | Attending: Family Medicine | Admitting: Family Medicine

## 2023-03-29 ENCOUNTER — Ambulatory Visit (INDEPENDENT_AMBULATORY_CARE_PROVIDER_SITE_OTHER): Payer: BLUE CROSS/BLUE SHIELD | Admitting: Family Medicine

## 2023-03-29 ENCOUNTER — Other Ambulatory Visit (HOSPITAL_COMMUNITY): Payer: Self-pay

## 2023-03-29 VITALS — BP 116/69 | HR 82 | Ht 69.0 in | Wt 161.8 lb

## 2023-03-29 DIAGNOSIS — D696 Thrombocytopenia, unspecified: Secondary | ICD-10-CM | POA: Diagnosis not present

## 2023-03-29 DIAGNOSIS — K7031 Alcoholic cirrhosis of liver with ascites: Secondary | ICD-10-CM

## 2023-03-29 DIAGNOSIS — F1011 Alcohol abuse, in remission: Secondary | ICD-10-CM | POA: Diagnosis not present

## 2023-03-29 HISTORY — PX: IR PARACENTESIS: IMG2679

## 2023-03-29 MED ORDER — LIDOCAINE HCL 1 % IJ SOLN
INTRAMUSCULAR | Status: AC
  Filled 2023-03-29: qty 20

## 2023-03-29 MED ORDER — LIDOCAINE HCL 1 % IJ SOLN: 20.0000 mL | Freq: Once | INTRAMUSCULAR | Status: DC

## 2023-03-29 MED ORDER — PROPRANOLOL HCL 10 MG PO TABS
10.0000 mg | ORAL_TABLET | Freq: Two times a day (BID) | ORAL | 3 refills | Status: DC
  Filled 2023-03-29 – 2023-05-05 (×3): qty 180, 90d supply, fill #0

## 2023-03-29 MED ORDER — OXYCODONE HCL 5 MG PO TABS
5.0000 mg | ORAL_TABLET | Freq: Three times a day (TID) | ORAL | 0 refills | Status: AC | PRN
Start: 2023-03-29 — End: ?
  Filled 2023-03-29: qty 20, 7d supply, fill #0

## 2023-03-29 MED ORDER — EPLERENONE 50 MG PO TABS
50.0000 mg | ORAL_TABLET | Freq: Every day | ORAL | 3 refills | Status: AC
Start: 2023-03-29 — End: ?
  Filled 2023-03-29: qty 90, 90d supply, fill #0

## 2023-03-29 NOTE — Telephone Encounter (Signed)
 Pt stated that his insurance company notified him that they would not cover the EGD due to the fact that he was out of network and that he must be seen by [facility].  Pt was given the number to [facility] to see how much the procedure would be with out insurance.  Pt requested to be called back this afternoon with an update on the status of the price and his decision.

## 2023-03-29 NOTE — Telephone Encounter (Signed)
 Please see notes below also Pt stated that he called [facility] to discuss the out of pocket expenses that he would have to pay for the EGD without his insurance covering it. Pt stated that it was going to be several thousand dollars. Pt stated that he would have to cancel. Pt was very apologetic and expressed his apologies to Dr. Leone Payor also. Scheduling  was contacted and procedure was canceled. Pt made aware. Routed as [employer]

## 2023-03-29 NOTE — Telephone Encounter (Signed)
 Left message for pt to call back

## 2023-03-30 ENCOUNTER — Ambulatory Visit (HOSPITAL_COMMUNITY)
Admission: RE | Admit: 2023-03-30 | Payer: BLUE CROSS/BLUE SHIELD | Source: Home / Self Care | Admitting: Internal Medicine

## 2023-03-30 ENCOUNTER — Encounter: Payer: Self-pay | Admitting: Internal Medicine

## 2023-03-30 LAB — CBC
Hematocrit: 37.1 % — ABNORMAL LOW (ref 37.5–51.0)
Hemoglobin: 12.8 g/dL — ABNORMAL LOW (ref 13.0–17.7)
MCH: 32.3 pg (ref 26.6–33.0)
MCHC: 34.5 g/dL (ref 31.5–35.7)
MCV: 94 fL (ref 79–97)
Platelets: 160 10*3/uL (ref 150–450)
RBC: 3.96 x10E6/uL — ABNORMAL LOW (ref 4.14–5.80)
RDW: 13.3 % (ref 11.6–15.4)
WBC: 3 10*3/uL — ABNORMAL LOW (ref 3.4–10.8)

## 2023-03-30 LAB — COMPREHENSIVE METABOLIC PANEL
ALT: 21 IU/L (ref 0–44)
AST: 47 IU/L — ABNORMAL HIGH (ref 0–40)
Albumin: 3.4 g/dL — ABNORMAL LOW (ref 4.1–5.1)
Alkaline Phosphatase: 101 IU/L (ref 44–121)
BUN/Creatinine Ratio: 6 — ABNORMAL LOW (ref 9–20)
BUN: 4 mg/dL — ABNORMAL LOW (ref 6–24)
Bilirubin Total: 3.2 mg/dL — ABNORMAL HIGH (ref 0.0–1.2)
CO2: 21 mmol/L (ref 20–29)
Calcium: 8.8 mg/dL (ref 8.7–10.2)
Chloride: 106 mmol/L (ref 96–106)
Creatinine, Ser: 0.64 mg/dL — ABNORMAL LOW (ref 0.76–1.27)
Globulin, Total: 3.8 g/dL (ref 1.5–4.5)
Glucose: 81 mg/dL (ref 70–99)
Potassium: 4.1 mmol/L (ref 3.5–5.2)
Sodium: 139 mmol/L (ref 134–144)
Total Protein: 7.2 g/dL (ref 6.0–8.5)
eGFR: 117 mL/min/{1.73_m2} (ref >59)

## 2023-03-30 LAB — PROTIME-INR
INR: 1.4 — ABNORMAL HIGH (ref 0.9–1.2)
Prothrombin Time: 15.8 s — ABNORMAL HIGH (ref 9.1–12.0)

## 2023-03-30 SURGERY — ESOPHAGOGASTRODUODENOSCOPY (EGD) WITH PROPOFOL
Anesthesia: Monitor Anesthesia Care

## 2023-03-30 NOTE — Assessment & Plan Note (Signed)
 Congratulated him on continued abstinence.  AA is being useful to him.  I am glad he finally decided to join

## 2023-03-30 NOTE — Assessment & Plan Note (Signed)
" >>  ASSESSMENT AND PLAN FOR THROMBOCYTOPENIA WRITTEN ON 03/30/2023  4:48 PM BY Deisi Salonga L, MD  Recheck platelet level today. "

## 2023-03-30 NOTE — Progress Notes (Signed)
    CHIEF COMPLAINT / HPI: #1.  Hepatic cirrhosis with ascites had paracentesis today and they took off 1.9 L.  #2.  He is having EGD tomorrow with concern because he always has a lot of throat pain after they do the banding.  Wonders if I will give him small amount of oxycodone  Has been unable to get the lidocaine patches because of insurance. 3.  His appetite is back but he is lost quite a bit of weight.  His energy level remains low. 4.  Remains abstinent.   PERTINENT  PMH / PSH: I have reviewed the patient's medications, allergies, past medical and surgical history, smoking status and updated in the EMR as appropriate.   OBJECTIVE:  BP 116/69   Pulse 82   Ht 5\' 9"  (1.753 m)   Wt 161 lb 12.8 oz (73.4 kg)   SpO2 98%   BMI 23.89 kg/m  GENERAL: Well-developed, very thin. Skin: Bronze color.  HEENT: Nonicteric conjunctiva, EOMI no nystagmus CV: Regular rate and rhythm TOENAIL: Great toenail on the right foot has resolving hematoma. PSYCH: AxOx4. Good eye contact.[name] Kitchen  Psychomotor retardation.  Appropriate speech  content, fluency is slightly delayed. Asks and answers questions appropriately. Mood is congruent.   that his mood is intact. GAIT: Normal.   ASSESSMENT / PLAN:   Liver cirrhosis (HCC) He has had some continued weight loss, muscle mass loss and increased hyperpigmentation of his skin.  Will check some labs today.  Refilled some of his medications.  I am glad he is getting his EGD tomorrow. I will give him a small number of oxycodone for postprocedure pain medication.  Thrombocytopenia (HCC) Recheck platelet level today.  Alcohol abuse, in remission Congratulated him on continued abstinence.  AA is being useful to him.  I am glad he finally decided to join   Denny Levy MD

## 2023-03-30 NOTE — Assessment & Plan Note (Signed)
 Recheck platelet level today.

## 2023-03-30 NOTE — Assessment & Plan Note (Signed)
 He has had some continued weight loss, muscle mass loss and increased hyperpigmentation of his skin.  Will check some labs today.  Refilled some of his medications.  I am glad he is getting his EGD tomorrow. I will give him a small number of oxycodone for postprocedure pain medication.

## 2023-03-31 NOTE — Telephone Encounter (Signed)
 Spoke with patient about this at office visit.  He can get OTC lidocaine patches which are 4%.  He will try that

## 2023-04-01 ENCOUNTER — Telehealth: Payer: Self-pay | Admitting: Hematology

## 2023-04-01 NOTE — Telephone Encounter (Signed)
Called to schedule an appointment per staff message. Left VM for patient to call back to schedule an appointment.

## 2023-04-07 ENCOUNTER — Encounter: Payer: Self-pay | Admitting: Family Medicine

## 2023-04-10 ENCOUNTER — Other Ambulatory Visit (HOSPITAL_COMMUNITY): Payer: Self-pay

## 2023-04-10 ENCOUNTER — Other Ambulatory Visit: Payer: Self-pay | Admitting: Family Medicine

## 2023-04-11 ENCOUNTER — Other Ambulatory Visit (HOSPITAL_COMMUNITY): Payer: Self-pay

## 2023-04-11 ENCOUNTER — Encounter (HOSPITAL_COMMUNITY): Payer: Self-pay

## 2023-04-18 ENCOUNTER — Telehealth: Payer: Self-pay | Admitting: Hematology

## 2023-04-18 NOTE — Telephone Encounter (Signed)
 Called to get the patient scheduled with Dr.Feng per staff message. Unable to make contact with the patient therefore a VM was left for the patient to call back to schedule an appointment.

## 2023-05-04 ENCOUNTER — Telehealth: Payer: Self-pay

## 2023-05-04 NOTE — Telephone Encounter (Signed)
 I will have our front office book him into slot below:   Shanda Bumps, Please put him at the 08:30 slot on my colposcopy clinic Thursday the 18th

## 2023-05-04 NOTE — Telephone Encounter (Signed)
 I put him in the 8:30 spot on the 17th. Jone Baseman, CMA

## 2023-05-04 NOTE — Transitions of Care (Post Inpatient/ED Visit) (Signed)
 05/04/2023  Name: Joshua Hubbard MRN: [id number] DOB: 25-Dec-1975  Today's TOC FU Call Status: Today's TOC FU Call Status:: Successful TOC FU Call Completed TOC FU Call Complete Date: 05/04/23 Patient's Name and Date of Birth confirmed.  Transition Care Management Follow-up Telephone Call Date of Discharge: 05/03/23 Discharge Facility: Other (Non-[facility] Facility) Name of Other ([facility]) Discharge Facility: [facility] Type of Discharge: Inpatient Admission Primary Inpatient Discharge Diagnosis:: Abdominal pain,   Pancytopenia (CMD) How have you been since you were released from the hospital?: Better Any questions or concerns?: No  Items Reviewed: Did you receive and understand the discharge instructions provided?: Yes Medications obtained,verified, and reconciled?: Yes (Medications Reviewed) Any new allergies since your discharge?: No Dietary orders reviewed?: NA Do you have support at home?: Yes People in Home [RPT]: parent(s), sibling(s)  Medications Reviewed Today: Medications Reviewed Today     Reviewed by Leigh Aurora, CMA (Certified Medical Assistant) on 05/04/23 at 0919  Med List Status: <None>   Medication Order Taking? Sig Documenting Provider Last Dose Status Informant  acamprosate (CAMPRAL) 333 MG tablet [id number]  Take 1 tablet (333 mg total) by mouth 3 (three) times daily. Tiffany Kocher, DO  Active   ciprofloxacin (CIPRO) 750 MG tablet [id number] Yes Take 750 mg by mouth 2 (two) times daily. [provider]  Active   cyanocobalamin 1000 MCG tablet [id number]  Take 1 tablet (1,000 mcg total) by mouth daily. Tiffany Kocher, DO  Active   eplerenone (INSPRA) 50 MG tablet [id number]  Take 1 tablet (50 mg total) by mouth daily. Nestor Ramp, MD  Active   furosemide (LASIX) 20 MG tablet [id number]  Take 1 tablet (20 mg total) by mouth daily. Tiffany Kocher, DO  Active   furosemide (LASIX) 40 MG tablet [id number] Yes Take 40 mg by mouth daily. [provider]  Active   lactulose (CHRONULAC) 10 GM/15ML solution [id number]  TAKE 30 MLS (20 G TOTAL) BY MOUTH 3 TIMES DAILY. Nestor Ramp, MD  Active Self, Pharmacy Records  lidocaine (LIDODERM) 5 % [id number]  PLACE 1 PATCH ONTO THE SKIN DAILY. REMOVE & DISCARD PATCH WITHIN 12 HOURS OR AS DIRECTED BY MD Nestor Ramp, MD  Active   metroNIDAZOLE (FLAGYL) 500 MG tablet [id number] Yes Take 500 mg by mouth 2 (two) times daily. [provider]  Active   ondansetron (ZOFRAN) 4 MG tablet [id number]  Take 1 tablet (4 mg total) by mouth every 8 (eight) hours as needed for nausea or vomiting. Nestor Ramp, MD  Active Self, Pharmacy Records           Med Note Erin Fulling, MEGAN   Thu Feb 09, 2023  9:49 AM) Unknown last dose.  oxyCODONE (OXY IR/ROXICODONE) 5 MG immediate release tablet [id number]  Take 1 tablet (5 mg total) by mouth every 8 (eight) hours as needed for severe pain (pain score 7-10). Nestor Ramp, MD  Active   pantoprazole (PROTONIX) 40 MG tablet [id number]  Take 1 tablet (40 mg total) by mouth 2 (two) times daily. Nestor Ramp, MD  Active Self, Pharmacy Records  potassium chloride (KLOR-CON) 20 MEQ packet [id number] Yes Take 20 mEq by mouth daily. [provider]  Active   propranolol (INDERAL) 10 MG tablet [id number]  Take 1 tablet (10 mg total) by mouth 2 (two) times daily. Nestor Ramp, MD  Active   vancomycin [facility]) 125 MG capsule [id number]  Take 1 capsule (125 mg total) by mouth 4 (four) times daily.  Tiffany Kocher, DO  Active             Home Care and Equipment/Supplies: Were Home Health Services Ordered?: NA Any new equipment or medical supplies ordered?: NA  Functional Questionnaire: Do you need assistance with bathing/showering or dressing?: No Do you need assistance with meal preparation?: No Do you need assistance with eating?: No Do you have difficulty maintaining continence: No Do you need assistance with getting out of bed/getting out of a  chair/moving?: No Do you have difficulty managing or taking your medications?: No  Follow up appointments reviewed: PCP Follow-up appointment confirmed?: Yes Date of PCP follow-up appointment?: 05/24/23 (no appt available before 05/24/23-note sent to PCP asking if pt could be worked in sooner) Follow-up Provider: Dr. Denny Levy Specialist [facility] Follow-up appointment confirmed?: NA Do you need transportation to your follow-up appointment?: No Do you understand care options if your condition(s) worsen?: Yes-patient verbalized understanding    SIGNATURE Agnes Lawrence, CMA (AAMA)  [facility] [phone]

## 2023-05-05 ENCOUNTER — Other Ambulatory Visit: Payer: Self-pay

## 2023-05-05 ENCOUNTER — Other Ambulatory Visit: Payer: Self-pay | Admitting: Family Medicine

## 2023-05-05 ENCOUNTER — Other Ambulatory Visit (HOSPITAL_COMMUNITY): Payer: Self-pay

## 2023-05-08 ENCOUNTER — Other Ambulatory Visit (HOSPITAL_COMMUNITY): Payer: Self-pay

## 2023-05-08 MED ORDER — POTASSIUM CHLORIDE 20 MEQ PO PACK
PACK | ORAL | 3 refills | Status: AC
  Filled 2023-05-08: qty 90, 90d supply, fill #0

## 2023-05-11 ENCOUNTER — Ambulatory Visit (INDEPENDENT_AMBULATORY_CARE_PROVIDER_SITE_OTHER): Admitting: Family Medicine

## 2023-05-11 VITALS — BP 102/62 | HR 64 | Wt 161.0 lb

## 2023-05-11 DIAGNOSIS — E291 Testicular hypofunction: Secondary | ICD-10-CM | POA: Diagnosis not present

## 2023-05-11 DIAGNOSIS — K7031 Alcoholic cirrhosis of liver with ascites: Secondary | ICD-10-CM | POA: Diagnosis not present

## 2023-05-11 DIAGNOSIS — F4323 Adjustment disorder with mixed anxiety and depressed mood: Secondary | ICD-10-CM

## 2023-05-11 MED ORDER — BUPROPION HCL ER (XL) 150 MG PO TB24: 150.0000 mg | ORAL_TABLET | Freq: Every day | ORAL | 1 refills | Status: DC

## 2023-05-11 NOTE — Patient Instructions (Signed)
 I will send a note about your labs. I have sent in the wellbutrin.

## 2023-05-12 DIAGNOSIS — F4323 Adjustment disorder with mixed anxiety and depressed mood: Secondary | ICD-10-CM | POA: Insufficient documentation

## 2023-05-12 LAB — TESTOSTERONE: Testosterone: 458 ng/dL (ref 264–916)

## 2023-05-12 NOTE — Assessment & Plan Note (Addendum)
 Reviewed hospitalization results.  He became quite neutropenic.  I would like to recheck this in about 3 weeks as an outpatient.  Regarding his muscle mass loss, I will recheck testosterone  today.  That might be something we could adjust/treat that would make him feel better.  He ask about some pain medicine I just do not think it safe to give him any narcotics at this point.  I will follow-up with him in about 3 weeks.  Long discussion about liver transplant:30 minutes total in discussion of current situation and future.

## 2023-05-12 NOTE — Assessment & Plan Note (Signed)
 Start wellbutrin . F/u 2 weeks.

## 2023-05-12 NOTE — Progress Notes (Signed)
    CHIEF COMPLAINT / HPI:  Liver cirrhosis with recent hospitalization for issues with hepatic encephalopathy, increased ascites, low white blood cell count.  Continues to feel extremely fatigued, still somewhat fuzzy in focus and concentration.  Not driving currently.  Really concerned about continued loss of muscle mass.   PERTINENT  PMH / PSH: I have reviewed the patient's medications, allergies, past medical and surgical history, smoking status and updated in the EMR as appropriate.   OBJECTIVE:  BP 102/62   Pulse 64   Wt 161 lb (73 kg)   SpO2 98%   BMI 23.78 kg/m   GENERAL: Ill-appearing male no acute distress CV: Regular rate and rhythm LUNGS: Clear to auscultation bilaterally ABDOMEN: Nontender.  Protuberant.  Positive bowel sounds. SKIN: Slightly icteric/gray but improved from last office visit PSYCH: Alert and oriented x 4.  Responses are mildly slowed.  Asked and answers questions appropriately.  No agitation.  Some psychomotor retardation is noted. ASSESSMENT / PLAN:   Liver cirrhosis (HCC) Reviewed hospitalization results.  He became quite neutropenic.  I would like to recheck this in about 3 weeks as an outpatient.  Regarding his muscle mass loss, I will recheck testosterone  today.  That might be something we could adjust/treat that would make him feel better.  He ask about some pain medicine I just do not think it safe to give him any narcotics at this point.  I will follow-up with him in about 3 weeks.  Long discussion about liver transplant:30 minutes total in discussion of current situation and future.   Adjustment disorder with mixed anxiety and depressed mood Start wellbutrin . F/u 2 weeks.   Violetta Grice MD

## 2023-05-16 ENCOUNTER — Encounter: Payer: Self-pay | Admitting: Family Medicine

## 2023-05-24 ENCOUNTER — Ambulatory Visit (INDEPENDENT_AMBULATORY_CARE_PROVIDER_SITE_OTHER): Payer: Self-pay | Admitting: Family Medicine

## 2023-05-24 VITALS — BP 126/69 | HR 66 | Wt 152.0 lb

## 2023-05-24 DIAGNOSIS — K7031 Alcoholic cirrhosis of liver with ascites: Secondary | ICD-10-CM

## 2023-05-24 DIAGNOSIS — F4323 Adjustment disorder with mixed anxiety and depressed mood: Secondary | ICD-10-CM

## 2023-05-24 DIAGNOSIS — L659 Nonscarring hair loss, unspecified: Secondary | ICD-10-CM | POA: Diagnosis not present

## 2023-05-24 DIAGNOSIS — D696 Thrombocytopenia, unspecified: Secondary | ICD-10-CM

## 2023-05-24 DIAGNOSIS — R7989 Other specified abnormal findings of blood chemistry: Secondary | ICD-10-CM

## 2023-05-24 MED ORDER — TESTOSTERONE 20.25 MG/ACT (1.62%) TD GEL: TRANSDERMAL | 3 refills | Status: AC

## 2023-05-24 MED ORDER — MINOXIDIL HAIR REGROWTH/MEN 5 % EX FOAM: CUTANEOUS | 3 refills | Status: AC

## 2023-05-24 MED ORDER — BUPROPION HCL ER (XL) 300 MG PO TB24: 300.0000 mg | ORAL_TABLET | Freq: Every day | ORAL | 3 refills | Status: AC

## 2023-05-24 NOTE — Patient Instructions (Signed)
 Give me a call in a week and let me know how the new meds are doing. Great to see you!

## 2023-05-25 DIAGNOSIS — R7989 Other specified abnormal findings of blood chemistry: Secondary | ICD-10-CM | POA: Insufficient documentation

## 2023-05-25 LAB — COMPREHENSIVE METABOLIC PANEL WITH GFR
ALT: 28 IU/L (ref 0–44)
AST: 49 IU/L — ABNORMAL HIGH (ref 0–40)
Albumin: 3.8 g/dL — ABNORMAL LOW (ref 4.1–5.1)
Alkaline Phosphatase: 86 IU/L (ref 44–121)
BUN/Creatinine Ratio: 19 (ref 9–20)
BUN: 12 mg/dL (ref 6–24)
Bilirubin Total: 2.3 mg/dL — ABNORMAL HIGH (ref 0.0–1.2)
CO2: 21 mmol/L (ref 20–29)
Calcium: 9.2 mg/dL (ref 8.7–10.2)
Chloride: 102 mmol/L (ref 96–106)
Creatinine, Ser: 0.62 mg/dL — ABNORMAL LOW (ref 0.76–1.27)
Globulin, Total: 3.7 g/dL (ref 1.5–4.5)
Glucose: 152 mg/dL — ABNORMAL HIGH (ref 70–99)
Potassium: 4.1 mmol/L (ref 3.5–5.2)
Sodium: 140 mmol/L (ref 134–144)
Total Protein: 7.5 g/dL (ref 6.0–8.5)
eGFR: 118 mL/min/{1.73_m2} (ref >59)

## 2023-05-25 LAB — CBC
Hematocrit: 35.7 % — ABNORMAL LOW (ref 37.5–51.0)
Hemoglobin: 12 g/dL — ABNORMAL LOW (ref 13.0–17.7)
MCH: 32 pg (ref 26.6–33.0)
MCHC: 33.6 g/dL (ref 31.5–35.7)
MCV: 95 fL (ref 79–97)
Platelets: 104 10*3/uL — ABNORMAL LOW (ref 150–450)
RBC: 3.75 x10E6/uL — ABNORMAL LOW (ref 4.14–5.80)
RDW: 15.5 % — ABNORMAL HIGH (ref 11.6–15.4)
WBC: 2.9 10*3/uL — ABNORMAL LOW (ref 3.4–10.8)

## 2023-05-25 LAB — PROTIME-INR
INR: 1.4 — ABNORMAL HIGH (ref 0.9–1.2)
Prothrombin Time: 15.3 s — ABNORMAL HIGH (ref 9.1–12.0)

## 2023-05-25 NOTE — Assessment & Plan Note (Signed)
 I will send the hepatic clinic a note to see if they can get him in sooner.  Continue his current medications.  He is maintaining abstinence.

## 2023-05-25 NOTE — Assessment & Plan Note (Signed)
" >>  ASSESSMENT AND PLAN FOR THROMBOCYTOPENIA WRITTEN ON 05/25/2023  5:18 PM BY Kevis Qu L, MD  Recheck some labs today to follow. "

## 2023-05-25 NOTE — Progress Notes (Signed)
    CHIEF COMPLAINT / HPI: #1.  Follow-up starting be appropriate on.  Just is really not doing a whole lot for him but is not having any side effects.  He would like to explore going up and dose. 2.  Appetite is down and says he just rested really feel like eating anything.  Has to force himself to take in some protein daily.  Using Ensure occasionally. 3.  Hepatic cirrhosis: Still having quite a bit of pain in his abdomen.  Has some recurrence of ascites but not to the place where he was prior to last admission.  Not been able to get an appointment with the specialist until August. 4. Continues to be concerned about muscle mass loss.  He is agreeable to trying the testosterone .   PERTINENT  PMH / PSH: I have reviewed the patient's medications, allergies, past medical and surgical history, smoking status and updated in the EMR as appropriate.   OBJECTIVE:  BP 126/69   Pulse 66   Wt 152 lb (68.9 kg)   SpO2 100%   BMI 22.45 kg/m  GENERAL: Well-developed no acute distress Skin: Slightly icteric ABDOMEN: Small amount of ascites, soft.  Distended. CV: Regular rate and rhythm Psych: Alert and oriented.  His speech and thought processes are a little bit slowed today but he asks and answers questions appropriately.  ASSESSMENT / PLAN:   Liver cirrhosis (HCC) I will send the hepatic clinic a note to see if they can get him in sooner.  Continue his current medications.  He is maintaining abstinence.  Adjustment disorder with mixed anxiety and depressed mood Will increase his bupropion  to 300 mg and follow-up 3 to 4 weeks.  Thrombocytopenia (HCC) Recheck some labs today to follow.  Low testosterone  Will try him on topical testosterone  replacement if his insurance will cover that.  He would like to avoid the needles if he can.   Violetta Grice MD

## 2023-05-25 NOTE — Assessment & Plan Note (Signed)
 Will increase his bupropion  to 300 mg and follow-up 3 to 4 weeks.

## 2023-05-25 NOTE — Assessment & Plan Note (Signed)
 Recheck some labs today to follow.

## 2023-05-25 NOTE — Assessment & Plan Note (Signed)
 Will try him on topical testosterone  replacement if his insurance will cover that.  He would like to avoid the needles if he can.

## 2023-05-26 ENCOUNTER — Telehealth: Payer: Self-pay

## 2023-05-26 NOTE — Telephone Encounter (Signed)
 Currently denied.   Faxed addt'l chart notes requested from insurance (testosterone  labs).

## 2023-05-26 NOTE — Telephone Encounter (Signed)
 Pharmacy Patient Advocate Encounter   Received notification from CoverMyMeds that prior authorization for Testosterone  20.25 MG/ACT(1.62%) gel is required/requested.   Insurance verification completed.   The patient is insured through [facility] .   PA required; PA submitted to above mentioned insurance via CoverMyMeds Key/confirmation #/EOC [id number]. Status is pending  Request may require 2 testosterone  labs collected during the morning hours.

## 2023-05-28 ENCOUNTER — Encounter: Payer: Self-pay | Admitting: Family Medicine

## 2023-05-29 NOTE — Telephone Encounter (Signed)
 Pharmacy Patient Advocate Encounter  Received notification from [facility] that Prior Authorization for Testosterone  20.25 MG/ACT(1.62%) gel has been DENIED.  Full denial letter will be uploaded to the media tab. See denial reason below.

## 2023-05-30 ENCOUNTER — Other Ambulatory Visit: Payer: Self-pay | Admitting: Family Medicine

## 2023-05-30 ENCOUNTER — Encounter: Payer: Self-pay | Admitting: Family Medicine

## 2023-05-30 DIAGNOSIS — R7989 Other specified abnormal findings of blood chemistry: Secondary | ICD-10-CM

## 2023-05-30 DIAGNOSIS — M6259 Muscle wasting and atrophy, not elsewhere classified, multiple sites: Secondary | ICD-10-CM

## 2023-06-28 ENCOUNTER — Encounter: Payer: Self-pay | Admitting: Emergency Medicine

## 2023-07-07 ENCOUNTER — Other Ambulatory Visit: Payer: Self-pay | Admitting: Family Medicine

## 2023-08-06 ENCOUNTER — Other Ambulatory Visit: Payer: Self-pay | Admitting: Family Medicine

## 2023-08-06 MED ORDER — HYDROCODONE-ACETAMINOPHEN 5-325 MG PO TABS: 1.0000 | ORAL_TABLET | Freq: Two times a day (BID) | ORAL | 0 refills | Status: AC | PRN

## 2023-10-04 ENCOUNTER — Other Ambulatory Visit: Payer: Self-pay | Admitting: Family Medicine

## 2023-10-04 MED ORDER — COVID-19 MRNA VAC-TRIS(PFIZER) 30 MCG/0.3ML IM SUSY: 0.3000 mL | PREFILLED_SYRINGE | Freq: Once | INTRAMUSCULAR | 0 refills | Status: AC

## 2023-10-16 ENCOUNTER — Telehealth: Payer: Self-pay

## 2023-10-16 NOTE — Telephone Encounter (Signed)
 2nd attempt  Prior authorization submitted for Testosterone  20.25 MG/ACT(1.62%) gel to [employer] via Latent.   Key: [id number]

## 2023-10-16 NOTE — Transitions of Care (Post Inpatient/ED Visit) (Signed)
   10/16/2023  Name: Joshua Hubbard MRN: [id number] DOB: Mar 15, 1975  Today's TOC FU Call Status: Today's TOC FU Call Status:: Unsuccessful Call (1st Attempt) Unsuccessful Call (1st Attempt) Date: 10/16/23  Attempted to reach the patient regarding the most recent Inpatient/ED visit.  Follow Up Plan: Additional outreach attempts will be made to reach the patient to complete the Transitions of Care (Post Inpatient/ED visit) call.   Signature  Charmaine Bloodgood, LPN [facility] Health Advisor [place] [facility] You Are. We Are. One [facility] Direct Dial [phone]

## 2023-10-17 ENCOUNTER — Telehealth: Payer: Self-pay

## 2023-10-17 NOTE — Telephone Encounter (Signed)
 Pharmacy Patient Advocate Encounter  Received notification from [facility] that Prior Authorization for Testosterone  20.25 MG/ACT(1.62%) gel has been CANCELLED due to previously being denied.   Patient will require 2 low testosterone  levels collected early in the morning.    PA #/Case ID/Reference #: [id number]

## 2023-10-17 NOTE — Transitions of Care (Post Inpatient/ED Visit) (Unsigned)
   10/17/2023  Name: Joshua Hubbard MRN: [id number] DOB: 17-Apr-1975  Today's TOC FU Call Status: Today's TOC FU Call Status:: Unsuccessful Call (2nd Attempt) Unsuccessful Call (2nd Attempt) Date: 10/17/23  Attempted to reach the patient regarding the most recent Inpatient/ED visit.  Follow Up Plan: Additional outreach attempts will be made to reach the patient to complete the Transitions of Care (Post Inpatient/ED visit) call.   Signature Julian Lemmings, LPN [facility] Nurse Health Advisor Direct Dial [phone]

## 2023-10-18 NOTE — Transitions of Care (Post Inpatient/ED Visit) (Signed)
   10/18/2023  Name: Joshua Hubbard MRN: [id number] DOB: [birth year]  Today's TOC FU Call Status: Today's TOC FU Call Status:: Unsuccessful Call (3rd Attempt) Unsuccessful Call (2nd Attempt) Date: 10/17/23 Unsuccessful Call (3rd Attempt) Date: 10/18/23  Attempted to reach the patient regarding the most recent Inpatient/ED visit.  Follow Up Plan: No further outreach attempts will be made at this time. We have been unable to contact the patient.  Signature Julian Lemmings, LPN [facility] Nurse Health Advisor Direct Dial [phone]

## 2023-11-03 ENCOUNTER — Other Ambulatory Visit: Payer: Self-pay | Admitting: Family Medicine

## 2023-12-12 ENCOUNTER — Other Ambulatory Visit (HOSPITAL_COMMUNITY): Payer: Self-pay

## 2023-12-12 ENCOUNTER — Other Ambulatory Visit: Payer: Self-pay | Admitting: Family Medicine

## 2023-12-13 ENCOUNTER — Other Ambulatory Visit (HOSPITAL_COMMUNITY): Payer: Self-pay

## 2023-12-13 MED ORDER — PANTOPRAZOLE SODIUM 40 MG PO TBEC
40.0000 mg | DELAYED_RELEASE_TABLET | Freq: Two times a day (BID) | ORAL | 3 refills | Status: AC
  Filled 2023-12-13 – 2023-12-18 (×3): qty 180, 90d supply, fill #0

## 2023-12-18 ENCOUNTER — Other Ambulatory Visit (HOSPITAL_COMMUNITY): Payer: Self-pay

## 2024-01-27 ENCOUNTER — Other Ambulatory Visit: Payer: Self-pay | Admitting: Family Medicine

## 2024-02-01 ENCOUNTER — Other Ambulatory Visit: Payer: Self-pay | Admitting: Nurse Practitioner

## 2024-02-01 DIAGNOSIS — K7030 Alcoholic cirrhosis of liver without ascites: Secondary | ICD-10-CM

## 2024-02-01 DIAGNOSIS — I8511 Secondary esophageal varices with bleeding: Secondary | ICD-10-CM

## 2024-02-02 ENCOUNTER — Encounter: Payer: Self-pay | Admitting: Student

## 2024-02-02 ENCOUNTER — Ambulatory Visit: Admitting: Student

## 2024-02-02 VITALS — BP 123/70 | HR 80 | Temp 99.2°F | Ht 69.0 in | Wt 167.2 lb

## 2024-02-02 DIAGNOSIS — R21 Rash and other nonspecific skin eruption: Secondary | ICD-10-CM

## 2024-02-02 MED ORDER — TRIAMCINOLONE ACETONIDE 0.1 % EX OINT: 1.0000 | TOPICAL_OINTMENT | Freq: Two times a day (BID) | CUTANEOUS | 0 refills | Status: AC

## 2024-02-02 NOTE — Patient Instructions (Addendum)
 It was great to see you! Thank you for allowing me to participate in your care!   I recommend that you always bring your medications to each appointment as this makes it easy to ensure we are on the correct medications and helps us  not miss when refills are needed.  Our plans for today:  - Use triamcinolone  ointment twice a day for 7 days, then as needed - Keep your skin moisturized - follow-up if rash worsens or does not improve   Take care and seek immediate care sooner if you develop any concerns. Please remember to show up 15 minutes before your scheduled appointment time!  Gladis Church, DO [facility]

## 2024-02-02 NOTE — Progress Notes (Signed)
" ° ° °  SUBJECTIVE:   CHIEF COMPLAINT / HPI:   Rash Presenting with bilateral forearm rash.  Reports rash occurred several days after his bone marrow biopsy (taken from vertebrae).  GI thought may be related to contact dermatitis from antiseptic use during biopsy, however patient reports he has no prior history of allergy to antiseptics-as he has had them before.  The rash occurred all at 1 time, did not start in 1 location and spread.  The rash is scaly and pruritic.  Nonpainful.  No other known exposures, has been using Aveeno.  He does garden, but has not been gardening during these winter months.  OBJECTIVE:   BP 123/70   Pulse 80   Temp 99.2 F (37.3 C) (Oral)   Ht 5' 9 (1.753 m)   Wt 167 lb 4 oz (75.9 kg)   SpO2 95%   BMI 24.70 kg/m    General: NAD, well-appearing, well-nourished Respiratory: No respiratory distress, breathing comfortably, able to speak in full sentences Psych: Appropriate affect and mood Skin: Patches with slight scaling and erythematous base, without central clearing, scattered on bilateral forearms. (Picture in chart)  ASSESSMENT/PLAN:   Assessment & Plan Rash Differential: Atopic dermatitis, contact dermatitis.  Lower concern for tinea without central clearing, and history provided. - Trial triamcinolone  0.1% ointment - If symptoms worsen or fail to improve, recommend follow-up - Consider treatment for tinea versus Derm referral if not improving   Gladis Church, DO [facility] "

## 2024-02-07 ENCOUNTER — Inpatient Hospital Stay: Admission: RE | Admit: 2024-02-07

## 2024-02-09 ENCOUNTER — Other Ambulatory Visit

## 2024-02-14 ENCOUNTER — Ambulatory Visit: Admitting: Family Medicine

## 2024-02-21 ENCOUNTER — Ambulatory Visit (INDEPENDENT_AMBULATORY_CARE_PROVIDER_SITE_OTHER): Admitting: Family Medicine

## 2024-02-21 ENCOUNTER — Encounter: Payer: Self-pay | Admitting: Family Medicine

## 2024-02-21 VITALS — BP 129/77 | HR 79 | Wt 165.6 lb

## 2024-02-21 DIAGNOSIS — F1011 Alcohol abuse, in remission: Secondary | ICD-10-CM

## 2024-02-21 DIAGNOSIS — F4323 Adjustment disorder with mixed anxiety and depressed mood: Secondary | ICD-10-CM | POA: Diagnosis not present
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
## 2024-02-23 NOTE — Assessment & Plan Note (Signed)
 Encouraged return to AA Reviewed recent notes from hepatology visit last week

## 2024-02-23 NOTE — Assessment & Plan Note (Signed)
 Depression Experiencing anhedonia and lack of appetite, likely exacerbated by recent stressors. Depression affecting daily functioning and motivation. - Encouraged participation in [employer] - Advised maintaining protein intake. - Discussed potential career changes. 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
